# Patient Record
Sex: Female | Born: 1950 | Race: Black or African American | Hispanic: No | Marital: Married | Smoking: Former smoker
Health system: Southern US, Community
[De-identification: ages and names within clinical notes are randomized; demographics above are authoritative.]

## PROBLEM LIST (undated history)

## (undated) DIAGNOSIS — M797 Fibromyalgia: Secondary | ICD-10-CM

## (undated) DIAGNOSIS — C50919 Malignant neoplasm of unspecified site of unspecified female breast: Secondary | ICD-10-CM

## (undated) DIAGNOSIS — K5790 Diverticulosis of intestine, part unspecified, without perforation or abscess without bleeding: Secondary | ICD-10-CM

## (undated) DIAGNOSIS — R7303 Prediabetes: Secondary | ICD-10-CM

## (undated) DIAGNOSIS — E119 Type 2 diabetes mellitus without complications: Secondary | ICD-10-CM

## (undated) DIAGNOSIS — I10 Essential (primary) hypertension: Secondary | ICD-10-CM

## (undated) DIAGNOSIS — K219 Gastro-esophageal reflux disease without esophagitis: Secondary | ICD-10-CM

## (undated) DIAGNOSIS — E7800 Pure hypercholesterolemia, unspecified: Secondary | ICD-10-CM

## (undated) DIAGNOSIS — F419 Anxiety disorder, unspecified: Secondary | ICD-10-CM

## (undated) DIAGNOSIS — M2550 Pain in unspecified joint: Secondary | ICD-10-CM

## (undated) DIAGNOSIS — R011 Cardiac murmur, unspecified: Secondary | ICD-10-CM

## (undated) DIAGNOSIS — F329 Major depressive disorder, single episode, unspecified: Secondary | ICD-10-CM

## (undated) DIAGNOSIS — F32A Depression, unspecified: Secondary | ICD-10-CM

## (undated) DIAGNOSIS — T7840XA Allergy, unspecified, initial encounter: Secondary | ICD-10-CM

## (undated) HISTORY — DX: Prediabetes: R73.03

## (undated) HISTORY — DX: Fibromyalgia: M79.7

## (undated) HISTORY — DX: Cardiac murmur, unspecified: R01.1

## (undated) HISTORY — DX: Gastro-esophageal reflux disease without esophagitis: K21.9

## (undated) HISTORY — DX: Type 2 diabetes mellitus without complications: E11.9

## (undated) HISTORY — PX: TUBAL LIGATION: SHX77

## (undated) HISTORY — PX: FOOT SURGERY: SHX648

## (undated) HISTORY — DX: Malignant neoplasm of unspecified site of unspecified female breast: C50.919

## (undated) HISTORY — DX: Depression, unspecified: F32.A

## (undated) HISTORY — DX: Essential (primary) hypertension: I10

## (undated) HISTORY — DX: Allergy, unspecified, initial encounter: T78.40XA

## (undated) HISTORY — DX: Diverticulosis of intestine, part unspecified, without perforation or abscess without bleeding: K57.90

## (undated) HISTORY — DX: Pure hypercholesterolemia, unspecified: E78.00

## (undated) HISTORY — DX: Pain in unspecified joint: M25.50

---
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
## 2000-05-08 ENCOUNTER — Encounter: Payer: Self-pay | Admitting: Emergency Medicine

## 2000-05-08 ENCOUNTER — Emergency Department (HOSPITAL_COMMUNITY): Admission: EM | Admit: 2000-05-08 | Discharge: 2000-05-08 | Payer: Self-pay | Admitting: Emergency Medicine

## 2001-02-14 ENCOUNTER — Emergency Department (HOSPITAL_COMMUNITY): Admission: EM | Admit: 2001-02-14 | Discharge: 2001-02-15 | Payer: Self-pay | Admitting: Emergency Medicine

## 2001-04-27 ENCOUNTER — Ambulatory Visit (HOSPITAL_COMMUNITY): Admission: RE | Admit: 2001-04-27 | Discharge: 2001-04-27 | Payer: Self-pay | Admitting: Obstetrics and Gynecology

## 2001-04-27 ENCOUNTER — Encounter: Payer: Self-pay | Admitting: Obstetrics and Gynecology

## 2003-10-13 ENCOUNTER — Emergency Department (HOSPITAL_COMMUNITY): Admission: EM | Admit: 2003-10-13 | Discharge: 2003-10-13 | Payer: Self-pay | Admitting: Family Medicine

## 2003-12-09 ENCOUNTER — Encounter: Admission: RE | Admit: 2003-12-09 | Discharge: 2003-12-09 | Payer: Self-pay | Admitting: Family Medicine

## 2004-02-23 ENCOUNTER — Emergency Department (HOSPITAL_COMMUNITY): Admission: EM | Admit: 2004-02-23 | Discharge: 2004-02-23 | Payer: Self-pay | Admitting: Emergency Medicine

## 2004-09-25 ENCOUNTER — Other Ambulatory Visit: Admission: RE | Admit: 2004-09-25 | Discharge: 2004-09-25 | Payer: Self-pay | Admitting: Radiology

## 2005-07-01 LAB — HM COLONOSCOPY: HM Colonoscopy: NORMAL

## 2005-12-16 ENCOUNTER — Ambulatory Visit (HOSPITAL_COMMUNITY): Payer: Self-pay | Admitting: *Deleted

## 2006-06-28 ENCOUNTER — Emergency Department (HOSPITAL_COMMUNITY): Admission: EM | Admit: 2006-06-28 | Discharge: 2006-06-28 | Payer: Self-pay | Admitting: Emergency Medicine

## 2006-08-18 ENCOUNTER — Encounter: Admission: RE | Admit: 2006-08-18 | Discharge: 2006-11-16 | Payer: Self-pay | Admitting: Family Medicine

## 2006-12-17 ENCOUNTER — Ambulatory Visit: Payer: Self-pay | Admitting: Gastroenterology

## 2006-12-29 ENCOUNTER — Ambulatory Visit: Payer: Self-pay | Admitting: Gastroenterology

## 2007-01-29 ENCOUNTER — Ambulatory Visit: Payer: Self-pay | Admitting: Gastroenterology

## 2007-03-26 ENCOUNTER — Emergency Department: Payer: Self-pay | Admitting: Emergency Medicine

## 2008-01-28 ENCOUNTER — Encounter: Payer: Self-pay | Admitting: Internal Medicine

## 2008-05-06 ENCOUNTER — Other Ambulatory Visit: Admission: RE | Admit: 2008-05-06 | Discharge: 2008-05-06 | Payer: Self-pay | Admitting: Obstetrics and Gynecology

## 2008-11-21 ENCOUNTER — Emergency Department: Payer: Self-pay | Admitting: Emergency Medicine

## 2009-02-06 ENCOUNTER — Encounter (INDEPENDENT_AMBULATORY_CARE_PROVIDER_SITE_OTHER): Payer: Self-pay | Admitting: *Deleted

## 2009-02-06 LAB — CONVERTED CEMR LAB
Cholesterol: 188 mg/dL
HDL: 58 mg/dL
Total CHOL/HDL Ratio: 3.24
Triglycerides: 59 mg/dL

## 2009-02-13 ENCOUNTER — Emergency Department: Payer: Self-pay | Admitting: Emergency Medicine

## 2009-06-09 ENCOUNTER — Encounter: Payer: Self-pay | Admitting: Family Medicine

## 2009-07-18 ENCOUNTER — Telehealth (INDEPENDENT_AMBULATORY_CARE_PROVIDER_SITE_OTHER): Payer: Self-pay | Admitting: *Deleted

## 2009-08-02 ENCOUNTER — Ambulatory Visit: Payer: Self-pay | Admitting: Family Medicine

## 2010-02-08 ENCOUNTER — Ambulatory Visit: Payer: Self-pay | Admitting: Obstetrics and Gynecology

## 2010-04-03 ENCOUNTER — Ambulatory Visit: Payer: Self-pay | Admitting: Family Medicine

## 2010-04-03 DIAGNOSIS — N3011 Interstitial cystitis (chronic) with hematuria: Secondary | ICD-10-CM

## 2010-04-03 DIAGNOSIS — N959 Unspecified menopausal and perimenopausal disorder: Secondary | ICD-10-CM | POA: Insufficient documentation

## 2010-04-03 DIAGNOSIS — IMO0001 Reserved for inherently not codable concepts without codable children: Secondary | ICD-10-CM

## 2010-04-03 DIAGNOSIS — J309 Allergic rhinitis, unspecified: Secondary | ICD-10-CM | POA: Insufficient documentation

## 2010-04-03 DIAGNOSIS — I10 Essential (primary) hypertension: Secondary | ICD-10-CM | POA: Insufficient documentation

## 2010-04-03 DIAGNOSIS — F321 Major depressive disorder, single episode, moderate: Secondary | ICD-10-CM

## 2010-04-03 DIAGNOSIS — D509 Iron deficiency anemia, unspecified: Secondary | ICD-10-CM | POA: Insufficient documentation

## 2010-04-03 DIAGNOSIS — M479 Spondylosis, unspecified: Secondary | ICD-10-CM | POA: Insufficient documentation

## 2010-04-05 ENCOUNTER — Ambulatory Visit: Payer: Self-pay | Admitting: Family Medicine

## 2010-04-05 DIAGNOSIS — E7800 Pure hypercholesterolemia, unspecified: Secondary | ICD-10-CM | POA: Insufficient documentation

## 2010-04-06 ENCOUNTER — Encounter (INDEPENDENT_AMBULATORY_CARE_PROVIDER_SITE_OTHER): Payer: Self-pay | Admitting: *Deleted

## 2010-04-06 ENCOUNTER — Telehealth: Payer: Self-pay | Admitting: Family Medicine

## 2010-04-06 LAB — CONVERTED CEMR LAB
ALT: 18 units/L (ref 0–35)
AST: 22 units/L (ref 0–37)
Bilirubin, Direct: 0 mg/dL (ref 0.0–0.3)
CO2: 33 meq/L — ABNORMAL HIGH (ref 19–32)
Cholesterol: 194 mg/dL (ref 0–200)
HDL: 61.7 mg/dL (ref >39.00)
LDL Cholesterol: 120 mg/dL — ABNORMAL HIGH (ref 0–99)
Potassium: 3.8 meq/L (ref 3.5–5.1)
VLDL: 12.2 mg/dL (ref 0.0–40.0)

## 2010-04-19 ENCOUNTER — Ambulatory Visit: Payer: Self-pay | Admitting: Family Medicine

## 2010-04-27 ENCOUNTER — Encounter: Payer: Self-pay | Admitting: Family Medicine

## 2010-05-15 ENCOUNTER — Ambulatory Visit: Payer: Self-pay | Admitting: Family Medicine

## 2010-05-19 ENCOUNTER — Ambulatory Visit: Payer: Self-pay | Admitting: Cardiovascular Disease

## 2010-05-19 ENCOUNTER — Emergency Department: Payer: Self-pay | Admitting: Emergency Medicine

## 2010-06-05 ENCOUNTER — Ambulatory Visit: Payer: Self-pay | Admitting: Family Medicine

## 2010-06-21 ENCOUNTER — Telehealth (INDEPENDENT_AMBULATORY_CARE_PROVIDER_SITE_OTHER): Payer: Self-pay | Admitting: *Deleted

## 2010-06-21 ENCOUNTER — Ambulatory Visit: Payer: Self-pay | Admitting: Family Medicine

## 2010-06-26 ENCOUNTER — Ambulatory Visit: Payer: Self-pay | Admitting: Family Medicine

## 2010-07-10 ENCOUNTER — Ambulatory Visit: Admit: 2010-07-10 | Payer: Self-pay | Admitting: Family Medicine

## 2010-07-22 ENCOUNTER — Encounter: Payer: Self-pay | Admitting: Rheumatology

## 2010-07-29 LAB — CONVERTED CEMR LAB
Pap Smear: NORMAL
Pap Smear: NORMAL
Pap Smear: NORMAL

## 2010-07-31 NOTE — Assessment & Plan Note (Signed)
Summary: COUGH,CONGESTION/CLE   Vital Signs:  Patient profile:   60 year old female Height:      64 inches Weight:      136.4 pounds BMI:     23.50 Temp:     98.3 degrees F oral Pulse rate:   76 / minute Pulse rhythm:   regular BP sitting:   100 / 70  (left arm) Cuff size:   regular  Vitals Entered By: Benny Lennert CMA Duncan Dull) (June 05, 2010 11:18 AM)  History of Present Illness: Chief complaint cough and congestion  Sneezing alot.. worse sinus congestion this fall.    Acute Visit History:      The patient complains of cough, headache, nasal discharge, and sore throat.  These symptoms began 3 days ago.  She denies chest pain, earache, fever, and sinus problems.  Other comments include: 3 weeks ago ..had cough..resolved on its own  mild body ache.   Using OTC guafenesin.        The patient notes shortness of breath.  The cough interferes with her sleep.  The character of the cough is described as productive.  There is no history of wheezing associated with her cough.        Preventive Screening-Counseling & Management  Alcohol-Tobacco     Smoking Status: quit  Problems Prior to Update: 1)  Hypercholesterolemia  (ICD-272.0) 2)  Family History Breast Cancer 1st Degree Relative <50  (ICD-V16.3) 3)  Special Screening For Osteoporosis  (ICD-V82.81) 4)  Hypertension  (ICD-401.9) 5)  Allergic Rhinitis  (ICD-477.9) 6)  Interstitial Cystitis  (ICD-595.1) 7)  Postmenopausal Syndrome  (ICD-627.9) 8)  Depression, Major, Moderate  (ICD-296.22) 9)  Degenerative Joint Disease, Cervical Spine  (ICD-721.90) 10)  Fibromyalgia  (ICD-729.1) 11)  Anemia, Iron Deficiency  (ICD-280.9)  Current Medications (verified): 1)  Zetia 10 Mg Tabs (Ezetimibe) .... One Tablet 2 Times Daily 2)  Lyrica 100 Mg Caps (Pregabalin) .... Take One Tablet 2 Times Daily 3)  Alprazolam 1 Mg Tabs (Alprazolam) .... Take One Tablet 4 Times Daily 4)  Skelaxin 800 Mg Tabs (Metaxalone) .... Take One Tablet  3  Times Daily 5)  Boniva 150 Mg Tabs (Ibandronate Sodium) .... Not Sure of Direction 6)  Elmiron 100 Mg Caps (Pentosan Polysulfate Sodium) .... Take One Tablet 3 Times A Day 7)  Hyzaar 50-12.5 Mg Tabs (Losartan Potassium-Hctz) .... One Tablet Daily 8)  Hyomax-Ft 0.125 Mg Tbdp (Hyoscyamine Sulfate) .Marland Kitchen.. 1 Tab By Mouth Qid As Needed Gi Spasm  Allergies (verified): No Known Drug Allergies  Past History:  Past medical, surgical, family and social histories (including risk factors) reviewed, and no changes noted (except as noted below).  Past Medical History: Reviewed history from 04/03/2010 and no changes required. Allergic rhinitis Hypertension  Family History: Reviewed history from 04/03/2010 and no changes required. mother: Alzheimer's father: CAD, prostate cacner, HTN, DM sister: breast cancer Family History Breast cancer 1st degree relative <50 brother: prostate Cacner, HTN, high chol  Social History: Reviewed history from 04/03/2010 and no changes required. 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 CV:  Denies chest pain or discomfort. Resp:  Denies coughing up blood.  Physical Exam  General:  Well-developed,well-nourished,in no acute distress; alert,appropriate and cooperative throughout examination Head:  no maxillart sinus pain Eyes:  No corneal or conjunctival inflammation  noted. EOMI. Perrla. Funduscopic exam benign, without hemorrhages, exudates or papilledema. Vision grossly normal. Ears:  External ear exam shows no significant lesions or deformities.  Otoscopic examination reveals clear canals, tympanic membranes are intact bilaterally without bulging, retraction, inflammation or discharge. Hearing is grossly normal  bilaterally. Nose:  nasla congestion, mucosal pallor Mouth:  MMM, no oropharyngeal erythema Neck:  no carotid bruit or thyromegaly no cervical or supraclavicular lymphadenopathy  Lungs:  Normal respiratory effort, chest expands symmetrically. Lungs are clear to auscultation, no crackles or wheezes. Heart:  Normal rate and regular rhythm. S1 and S2 normal without gallop, murmur, click, rub or other extra sounds.   Impression & Recommendations:  Problem # 1:  URI (ICD-465.9)  Her updated medication list for this problem includes:    Benzonatate 200 Mg Caps (Benzonatate) .Marland Kitchen... 1 cap by mouth three times a day as needed cough    Zyrtec Allergy 10 Mg Tabs (Cetirizine hcl) .Marland Kitchen... 1 tab by mouth at bedtime  Instructed on symptomatic treatment. Call if symptoms persist  past 4-5 days or worsen.   Problem # 2:  ALLERGIC RHINITIS (ICD-477.9) Add nasal steroid spray and zyrtec given symptoms ongoing all season.  Her updated medication list for this problem includes:    Fluticasone Propionate 50 Mcg/act Susp (Fluticasone propionate) .Marland Kitchen... 2 sprays per nostril at bedtime    Zyrtec Allergy 10 Mg Tabs (Cetirizine hcl) .Marland Kitchen... 1 tab by mouth at bedtime  Complete Medication List: 1)  Zetia 10 Mg Tabs (Ezetimibe) .... One tablet 2 times daily 2)  Lyrica 100 Mg Caps (Pregabalin) .... Take one tablet 2 times daily 3)  Alprazolam 1 Mg Tabs (Alprazolam) .... Take one tablet 4 times daily 4)  Skelaxin 800 Mg Tabs (Metaxalone) .... Take one tablet  3 times daily 5)  Boniva 150 Mg Tabs (Ibandronate sodium) .... Not sure of direction 6)  Elmiron 100 Mg Caps (Pentosan polysulfate sodium) .... Take one tablet 3 times a day 7)  Hyzaar 50-12.5 Mg Tabs (Losartan potassium-hctz) .... One tablet daily 8)  Hyomax-ft 0.125 Mg Tbdp (Hyoscyamine sulfate) .Marland Kitchen.. 1 tab by mouth qid as needed gi spasm 9)  Fluticasone Propionate 50 Mcg/act Susp (Fluticasone propionate) .... 2 sprays per nostril at bedtime 10)  Benzonatate 200  Mg Caps (Benzonatate) .Marland Kitchen.. 1 cap by mouth three times a day as needed cough 11)  Zyrtec Allergy 10 Mg Tabs (Cetirizine hcl) .Marland Kitchen.. 1 tab by mouth at bedtime  Patient Instructions: 1)  Start mucinex no decongestant daily. 2)  Start nasal steroid spray. 3)  Zyrtec at bedtime. 4)   Cough suppressant as needed. Call if symptoms not improving in 4-5 days.  Prescriptions: BENZONATATE 200 MG CAPS (BENZONATATE) 1 cap by mouth three times a day as needed cough  #30 x 0   Entered and Authorized by:   Kerby Nora MD   Signed by:   Kerby Nora MD on 06/05/2010   Method used:   Electronically to        [id number] (retail)       [address]       [place], [place]  [address]       Ph: [phone]       Fax: [phone]   RxID:   [id number] FLUTICASONE PROPIONATE 50 MCG/ACT SUSP (FLUTICASONE PROPIONATE) 2 sprays per nostril at bedtime  #1 x 3   Entered and Authorized by:   Kerby Nora MD   Signed by:   Kerby Nora MD on 06/05/2010  Method used:   [facility]ically to        [id number]       [address]       [place], [place]  [address]       Ph: [phone]       Fax: [phone]   RxID:   [id number]    Orders Added: 1)  Est. Patient Level III [24401]    Current Allergies (reviewed today): No known allergies

## 2010-07-31 NOTE — Progress Notes (Signed)
Summary: wants something for abd spasms  Phone Note Call from Patient Call back at Work Phone [phone]   Caller: Patient Call For: Kerby Nora MD Summary of Call: Pt is asking that levsin be called to [facility].  She says she has occasional problems with abd spasms that she forgot to mention to you at her office visit.  Her previous doctor gave her hyomax .125 mg's. Initial call taken by: Lowella Petties CMA,  April 06, 2010 10:02 AM  Follow-up for Phone Call        Patient advised.Consuello Masse CMA   Follow-up by: Benny Lennert CMA Duncan Dull),  April 06, 2010 11:10 AM    New/Updated Medications: HYOMAX-FT 0.125 MG TBDP (HYOSCYAMINE SULFATE) 1 tab by mouth QID as needed Gi spasm Prescriptions: HYOMAX-FT 0.125 MG TBDP (HYOSCYAMINE SULFATE) 1 tab by mouth QID as needed Gi spasm  #90 x 2   Entered and Authorized by:   Kerby Nora MD   Signed by:   Kerby Nora MD on 04/06/2010   Method used:   Electronically to        [id number]       [address]       [place], [place]  [address]       Ph: [phone]       Fax: [phone]   RxID:   [id number]

## 2010-07-31 NOTE — Miscellaneous (Signed)
   Clinical Lists Changes  Observations: Added new observation of FERRITIN: 245 ng/mL (02/06/2009 12:00) Added new observation of IRON SATUR %: 20 % (02/06/2009 12:00) Added new observation of IRON: 68 mcg/dL (29/92/4268 34:19) Added new observation of CHOL/HDL: 3.24  (02/06/2009 12:00) Added new observation of LDL: 125 mg/dL (62/22/9798 92:11) Added new observation of HDL: 58 mg/dL (94/17/4081 44:81) Added new observation of TRIGLYC TOT: 59 mg/dL (85/63/1497 02:63) Added new observation of CHOLESTEROL: 188 mg/dL (78/58/8502 77:41)

## 2010-07-31 NOTE — Progress Notes (Signed)
Summary: ? New patient  Phone Note From Other Clinic Call back at [phone] ext. [phone]   Caller: Vicky Wilborn/[facility] Summary of Call: Vicky called to see if we can schedule patient an appt to establish care with Dr. Patsy Lager.  She says Shannon Hancock has hypertension and needs to be followed by a physician, she was recommended to Dr. Patsy Lager by his sister in law.  Please call Vicky if this is ok and if/when appt is scheduled.   Initial call taken by: [name]),  July 18, 2009 2:42 PM  Follow-up for Phone Call        Looks like I could see someone next week. Please schedule. Follow-up by: Hannah Beat MD,  July 18, 2009 2:48 PM  Additional Follow-up for Phone Call Additional follow up Details #1::        Dr.Copland, Pt.had an appt. w/ Dr.Bedsole on 07/11/09,but it was cancelled due to the weather.  I r/s her appt. w/ Dr.Bedsole to 08/02/09 @ 10:00. Additional Follow-up by: [name],  July 20, 2009 8:31 AM    Additional Follow-up for Phone Call Additional follow up Details #2::    [name] KitchenMarland KitchenPlease call patient..I am happy to see her, but if she wants to see Copland in preference then that is fine as well. Up to her. Copland is willing to see her.   Additional Follow-up for Phone Call Additional follow up Details #3:: Details for Additional Follow-up Action Taken: When I called the pt. earlier I told her I could make her appt. w/ Dr.Copland,but she said she wanted to see you.  The Drs. Office is the one that asked for her to see Dr.Copland. [name],  July 20, 2009 10:27 AM   Sound great..thanks. Kerby Nora MD  July 20, 2009 10:37 AM

## 2010-07-31 NOTE — Assessment & Plan Note (Signed)
Summary: NEW PT TO EST/CLE   Vital Signs:  Patient profile:   60 year old female Height:      64 inches Weight:      137.5 pounds BMI:     23.69 Temp:     98.0 degrees F oral Pulse rate:   76 / minute Pulse rhythm:   regular BP sitting:   110 / 70  (left arm) Cuff size:   regular  Vitals Entered By: Benny Lennert CMA Duncan Dull) (April 03, 2010 10:09 AM)  History of Present Illness: Chief complaint new patient to be established   Has been seeing Dr Cliffton Asters.. for last 15 years.Marland Kitchenleaving as in the area. HAs GYN...Dr. Catalina Gravel...had CPX.  NMl mammogram... Sister with breast cancer...  In past year she has noted B axillae sore. No masses.  Fibromyalgia, moderate control..sees Dr. Corliss Skains. on skelaxin and lyrica  Was Yoga  Depression, moderate control Has not tolertated meds in the past. Using Xanax for anxiety.. 3 a day...including using at night for sleep as well. Using instead of ambien in past.  Tried amitryptiline, seroquel, cymbalta in past.  Interstitial cystitis.Marland Kitchenon elmiron intermittantly...given by Dr. Barnabas Lister. Had cystoscopy.. pt  thinks nml.  Occurs when under stress.   HTN, well controlled on current dose hyzaar/HCTZ High cholesterol..last check in last year ..not adequately controlle don zetia  Preventive Screening-Counseling & Management  Alcohol-Tobacco     Smoking Status: current  Caffeine-Diet-Exercise     Does Patient Exercise: no      Drug Use:  no.    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 One Tablet 2 Times Daily 2)  Lyrica 100 Mg Caps (Pregabalin) .... Take One Tablet 2 Times Daily 3)  Alprazolam 1 Mg Tabs (Alprazolam) .... Take One Tablet 4 Times Daily 4)  Skelaxin 800 Mg Tabs (Metaxalone) .... Take One Tablet  3 Times Daily 5)  Boniva 150 Mg Tabs (Ibandronate Sodium) .... Not Sure of Direction 6)  Elmiron 100 Mg Caps (Pentosan Polysulfate Sodium) .... Take One Tablet 3 Times A Day 7)  Hyzaar 50-12.5 Mg Tabs (Losartan Potassium-Hctz) .... One Tablet Daily  Allergies (verified): No Known Drug Allergies  Past History:  Past medical, surgical, family and social histories (including risk factors) reviewed, and no changes noted (except as noted below).  Past Medical History: Allergic rhinitis Hypertension  Family History: Reviewed history and no changes required. mother: Alzheimer's father: CAD, prostate cacner, HTN, DM sister: breast cancer Family History Breast cancer 1st degree relative <50 brother: prostate Cacner, HTN, high chol  Social History: Reviewed history and no changes required. 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 CV:  Denies chest pain or discomfort. Resp:  Denies shortness of breath. GI:  Denies abdominal pain. GU:  Denies dysuria.  Physical Exam  General:  Well-developed,well-nourished,in no acute distress; alert,appropriate and cooperative throughout examination Mouth:  Oral mucosa and oropharynx without lesions or exudates.  Teeth in good repair. Neck:  no carotid bruit or thyromegaly no cervical or supraclavicular lymphadenopathy  Lungs:  Normal  respiratory effort, chest expands symmetrically. Lungs are clear to auscultation, no crackles or wheezes. Heart:  Normal rate and regular rhythm. S1 and S2 normal without gallop, murmur, click, rub or other extra sounds. Abdomen:  Bowel sounds positive,abdomen soft and non-tender without masses, organomegaly or hernias noted. Pulses:  R and L posterior tibial pulses are full and equal bilaterally  Extremities:  no edema  Skin:  Intact without suspicious lesions or rashes Psych:  Cognition and judgment appear intact. Alert and cooperative with normal attention span and concentration. No apparent delusions, illusions, hallucinations   Impression & Recommendations:  Problem # 1:  DEPRESSION, MAJOR, MODERATE (ICD-296.22) Moderate control. Wean off xanax as tolerated. Has not tolerated other medicaiotns to treat this in past.   Problem # 2:  FIBROMYALGIA (ICD-729.1) Fibromyalgia, moderate control..sees Dr. Corliss Skains. On skelaxin and lyrica  Was doing Yoga, plans to restart.  Requests referral to a different rheumatologist, but I recommended againt given Dr. Corliss Skains is very good with fibromyalgia. Did better in past when was able to go to a deep tissue massuse.  Offered referral to [facility].Isaias Sakai refused as when sh went in past..no help. Her updated medication list for this problem includes:    Skelaxin 800 Mg Tabs (Metaxalone) .Marland Kitchen... Take one tablet  3 times daily  Problem # 3:  HYPERTENSION (ICD-401.9) Well controlled. Continue current medication.  Her updated medication list for this problem includes:    Hyzaar 50-12.5 Mg Tabs (Losartan potassium-hctz) ..... One tablet daily  BP today: 110/70  Problem # 4:  INTERSTITIAL CYSTITIS (ICD-595.1) I do not prescribe elmiron...will refer to uro for full eval and recomendations on treatment.  Orders: Urology Referral (Urology)  Problem # 5:  HYPERCHOLESTEROLEMIA (ICD-272.0) Due for reeval.  Her updated medication  list for this problem includes:    Zetia 10 Mg Tabs (Ezetimibe) ..... One tablet 2 times daily  Complete Medication List: 1)  Zetia 10 Mg Tabs (Ezetimibe) .... One tablet 2 times daily 2)  Lyrica 100 Mg Caps (Pregabalin) .... Take one tablet 2 times daily 3)  Alprazolam 1 Mg Tabs (Alprazolam) .... Take one tablet 4 times daily 4)  Skelaxin 800 Mg Tabs (Metaxalone) .... Take one tablet  3 times daily 5)  Boniva 150 Mg Tabs (Ibandronate sodium) .... Not sure of direction 6)  Elmiron 100 Mg Caps (Pentosan polysulfate sodium) .... Take one tablet 3 times a day 7)  Hyzaar 50-12.5 Mg Tabs (Losartan potassium-hctz) .... One tablet daily 8)  Hyomax-ft 0.125 Mg Tbdp (Hyoscyamine sulfate) .Marland Kitchen.. 1 tab by mouth qid as needed gi spasm  Other Orders: Flu Vaccine 20yrs + ([address]) Admin 1st Vaccine (60454) Radiology Referral (Radiology)  Patient Instructions: 1)  Try to reduce the amount of xanax used. 2)  Get back on track with exercise. 3)  Referral Appointment Information 4)  Day/Date: 5)  Time: 6)  Place/MD: 7)  Address: 8)  Phone/Fax: 9)  Patient given appointment information. Information/Orders faxed/mailed.  10)  Fasting lipids, CMET Dx 272.0.  11)  Please schedule a follow-up appointment in 1 month 30 min.   Current Allergies (reviewed today): No known allergies    Flu Vaccine Result Date:  04/04/2010 Flu Vaccine Result:  given Flu Vaccine Next Due:  1  yr TD Result Date:  07/01/2001 TD Result:  given TD Next Due:  10 yr Flex Sig Next Due:  Not Indicated Colonoscopy Result Date:  07/01/2005 Colonoscopy Result:  normal Colonoscopy Next Due:  10 yr PAP Result Date:  12/29/2009 PAP Result:  normal PAP Next Due:  1 yr Mammogram Result Date:  12/29/2009 Mammogram Result:  normal Mammogram Next Due:  1 yr Last DXA 3 years ago.    Past Medical History:    Reviewed history and no changes required:       Allergic rhinitis       Hypertension        Immunizations  Administered:  Influenza Vaccine # 1:    Vaccine Type: Fluvax 3+    Site: left deltoid    Mfr: GlaxoSmithKline    Dose: 0.5 ml    Route: IM    Given by: Benny Lennert CMA (AAMA)    Exp. Date: 12/29/2010    Lot #: [id number]    VIS given: 01/23/10 version given April 03, 2010.  Flu Vaccine Consent Questions:    Do you have a history of severe allergic reactions to this vaccine? no    Any prior history of allergic reactions to egg and/or gelatin? no    Do you have a sensitivity to the preservative Thimersol? no    Do you have a past history of Guillan-Barre Syndrome? no    Do you currently have an acute febrile illness? no    Have you ever had a severe reaction to latex? no    Vaccine information given and explained to patient? yes    Are you currently pregnant? no  Appended Document: Orders Update     Clinical Lists Changes  Problems: Changed problem from OTHER OSTEOPOROSIS (ICD-733.09) to SPECIAL SCREENING FOR OSTEOPOROSIS (ICD-V82.81)

## 2010-07-31 NOTE — Letter (Signed)
Summary: [facility]  [facility]   Imported By: Lanelle Bal 07/27/2009 11:18:55  _____________________________________________________________________  External Attachment:    Type:   Image     Comment:   External Document

## 2010-08-02 NOTE — Progress Notes (Signed)
----   Converted from flag ---- ---- 06/20/2010 4:30 PM, Kerby Nora MD wrote: Had labs in 03/2010.. no labs needed unless pt symptomatic.   ---- 06/20/2010 7:54 AM, Liane Comber CMA (AAMA) wrote: Lab orders please! Good Morning! This pt is scheduled for cpx labs Tuesday, which labs to draw and dx codes to use? Thanks Tasha ------------------------------

## 2010-08-02 NOTE — Assessment & Plan Note (Signed)
Summary: COUGH/ lb   Vital Signs:  Patient profile:   60 year old female Weight:      140.25 pounds Temp:     97.8 degrees F oral Pulse rate:   84 / minute Pulse rhythm:   regular BP sitting:   116 / 70  (left arm) Cuff size:   regular  Vitals Entered By: Selena Batten Dance CMA Duncan Dull) (June 21, 2010 8:07 AM) CC: Still has cough and no energy   History of Present Illness: CC: cough  3-4 wk h/o cough productive of mild amt sputum and fatigue.  Falls asleep on couch.  Initially started wth cold sxs, sick contacts around home.  now everyone better except patient.  + PNDrip.  no acid reflux sxs, no hoarseness.  taking flonase and zyrtec both at night.  Taking it makes cough go away, however if stops, cough returns.  Also taking mucinex which makes cough better, then comes back.  wonders if fatigue, dizziness coming from these medicines.  No fevers.  No abd pain, n/v, rashes.  No ST.  Congestion better on medicine.  + chills.  + achiness.    No smokers at home.  h/o fibromyalgia.  On lyrica, skelaxin, zyrtec, zetia, xanax which she takes at night.  Current Medications (verified): 1)  Zetia 10 Mg Tabs (Ezetimibe) .... One Tablet 2 Times Daily 2)  Lyrica 100 Mg Caps (Pregabalin) .... Take One Tablet 2 Times Daily 3)  Alprazolam 1 Mg Tabs (Alprazolam) .... Take One Tablet 4 Times Daily 4)  Skelaxin 800 Mg Tabs (Metaxalone) .... Take One Tablet  3 Times Daily 5)  Boniva 150 Mg Tabs (Ibandronate Sodium) .... Not Sure of Direction 6)  Elmiron 100 Mg Caps (Pentosan Polysulfate Sodium) .... Take One Tablet 3 Times A Day 7)  Hyzaar 50-12.5 Mg Tabs (Losartan Potassium-Hctz) .... One Tablet Daily 8)  Hyomax-Ft 0.125 Mg Tbdp (Hyoscyamine Sulfate) .Marland Kitchen.. 1 Tab By Mouth Qid As Needed Gi Spasm 9)  Fluticasone Propionate 50 Mcg/act Susp (Fluticasone Propionate) .... 2 Sprays Per Nostril At Bedtime 10)  Zyrtec Allergy 10 Mg Tabs (Cetirizine Hcl) .Marland Kitchen.. 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 EOMI. Perrla.  Ears:  External ear exam shows no significant lesions or deformities.  Otoscopic examination reveals clear canals, tympanic membranes are intact bilaterally without bulging, retraction, inflammation or discharge. Hearing is grossly normal bilaterally. Nose:  nasal congestion, mucosal pallor, swollen turbinates even on INS Mouth:  MMM, no oropharyngeal erythema Neck:  no carotid bruit or thyromegaly no cervical lymphadenopathy  Lungs:  Normal respiratory effort, chest expands symmetrically. Lungs are clear to auscultation, no crackles or wheezes. Heart:  Normal rate and regular rhythm. S1 and S2 normal without gallop, murmur, click, rub or other extra sounds. Abdomen:  Bowel sounds positive,abdomen soft and non-tender without masses, organomegaly or hernias noted.  no splenomegaly Pulses:  2+ rad pulses Extremities:  no pedal edema  Skin:  Intact without  suspicious lesions or rashes   Impression & Recommendations:  Problem # 1:  COUGH (ICD-786.2) could be post-viral cough, but sounds more consistent with allergic/PND syndrome.  treat by trial of changing INS and antihistamine.  no evidence of infection currently, no need for abx today.  no evidence of reflux cough.  Problem # 2:  FATIGUE (ICD-780.79) upcoming blood  work per patient 12/27.  will add on CBC and TSH to eval other causes of fatigue.  could be zyrtec?  change.  could be fibro.  Problem # 3:  ALLERGIC RHINITIS (ICD-477.9) Discussed use of allergy medications.  trial of other antihistamine and INS.  Her updated medication list for this problem includes:    Nasonex 50 Mcg/act Susp (Mometasone furoate) .Marland Kitchen... 2 squirts in each nostril daily    Allegra Allergy 180 Mg Tabs (Fexofenadine hcl) ..... One daily for allergies  Complete Medication List: 1)  Zetia 10 Mg Tabs (Ezetimibe) .... One tablet 2 times daily 2)  Lyrica 100 Mg Caps (Pregabalin) .... Take one tablet 2 times daily 3)  Alprazolam 1 Mg Tabs (Alprazolam) .... Take one tablet 4 times daily 4)  Skelaxin 800 Mg Tabs (Metaxalone) .... Take one tablet  3 times daily 5)  Boniva 150 Mg Tabs (Ibandronate sodium) .... Not sure of direction 6)  Elmiron 100 Mg Caps (Pentosan polysulfate sodium) .... Take one tablet 3 times a day 7)  Hyzaar 50-12.5 Mg Tabs (Losartan potassium-hctz) .... One tablet daily 8)  Hyomax-ft 0.125 Mg Tbdp (Hyoscyamine sulfate) .Marland Kitchen.. 1 tab by mouth qid as needed gi spasm 9)  Nasonex 50 Mcg/act Susp (Mometasone furoate) .... 2 squirts in each nostril daily 10)  Allegra Allergy 180 Mg Tabs (Fexofenadine hcl) .... One daily for allergies  Patient Instructions: 1)  sounds like allergic cough or post viral cough. 2)  try another antihistamine instead of zyrtec - start claritin or allegra in am. 3)  try another nasal steroid - nasonex sent to pharmacy. 4)  Let us know if not helping. 5)  Add CBC, TSH to blood draw when you come 06/26/2010. Prescriptions: NASONEX 50 MCG/ACT SUSP (MOMETASONE FUROATE) 2 squirts in each nostril daily  #1 x 3   Entered and Authorized by:   Eustaquio Boyden  MD   Signed by:   Eustaquio Boyden  MD on 06/21/2010   Method used:   Electronically to        [id number] (retail)       [address]       [place], [place]   [address]       Ph: [phone]       Fax: [phone]   RxID:   [id number]    Orders Added: 1)  Est. Patient Level III [24401]    Current Allergies (reviewed today): No known allergies

## 2010-11-02 ENCOUNTER — Other Ambulatory Visit: Payer: Self-pay | Admitting: Family Medicine

## 2011-01-01 ENCOUNTER — Other Ambulatory Visit: Payer: Self-pay | Admitting: Family Medicine

## 2011-03-30 ENCOUNTER — Emergency Department: Payer: Self-pay | Admitting: Internal Medicine

## 2011-04-01 ENCOUNTER — Telehealth: Payer: Self-pay | Admitting: *Deleted

## 2011-04-01 NOTE — Telephone Encounter (Signed)
Patient went to [facility] ER Saturday and was dx with UTI, cystitis and gastritis and was given antibiotic and pain medication. Patient is scheduled to see you Friday. Patient states that she has a headache, achy and cold all over. The pain in her stomach has stopped and it does not hurt for her to urinate like it did. Patient states that she has a ringing in her head. Patient would like to see you before Friday and wants to know if she can be worked.

## 2011-04-01 NOTE — Telephone Encounter (Signed)
Appt made for patient on Thursday

## 2011-04-01 NOTE — Telephone Encounter (Signed)
Sounds to me like her symptoms are improving with treatment of urinary infection. I would continue antibiotics.. We can add her on to Thursday scheule, but I do not see reason she needs to be seen today. Use tylenol for headache.

## 2011-04-03 ENCOUNTER — Encounter: Payer: Self-pay | Admitting: Family Medicine

## 2011-04-04 ENCOUNTER — Ambulatory Visit (INDEPENDENT_AMBULATORY_CARE_PROVIDER_SITE_OTHER): Payer: Medicare Other | Admitting: Family Medicine

## 2011-04-04 ENCOUNTER — Encounter: Payer: Self-pay | Admitting: Family Medicine

## 2011-04-04 DIAGNOSIS — N390 Urinary tract infection, site not specified: Secondary | ICD-10-CM

## 2011-04-04 DIAGNOSIS — K2970 Gastritis, unspecified, without bleeding: Secondary | ICD-10-CM

## 2011-04-04 DIAGNOSIS — R55 Syncope and collapse: Secondary | ICD-10-CM

## 2011-04-04 DIAGNOSIS — M542 Cervicalgia: Secondary | ICD-10-CM

## 2011-04-04 DIAGNOSIS — H9319 Tinnitus, unspecified ear: Secondary | ICD-10-CM

## 2011-04-04 DIAGNOSIS — N3010 Interstitial cystitis (chronic) without hematuria: Secondary | ICD-10-CM

## 2011-04-04 DIAGNOSIS — N3011 Interstitial cystitis (chronic) with hematuria: Secondary | ICD-10-CM

## 2011-04-04 NOTE — Patient Instructions (Addendum)
We will call you with recommendations to Pt for opthamologist in the [place] area also with referral to integrative therapies. If eye exam does not explain all eye symptoms.. We could consider decreasing lyrica to see if still go fibro control but less blurred vision. Complete Cipro.. Likely causing headache and ringing in ears. Passing out spell likely due to too much pain med on same day.  Continue prilosec , make changes in diet to avoid heartburn triggers as discussed. Stop vicodin.. Use heat, gentle stretching, massage for neck muscle strain.

## 2011-04-04 NOTE — Progress Notes (Signed)
Subjective:    Patient ID: Shannon Hancock, female    DOB: [birth year], 60 y.o.   MRN: [id number]  HPI  60 year old female with history of interstitial cystitis presents following [facility] ER visit on 9/29 for low back pain, right lower abdominal pain, aches and chills. Had also noted occ burning to urinate few days prior. Had noted some occ heartburn, central chest pain.   She had elevated wbc at 11.3, Hg 11.1, nml CMET, UA showed blood and LE.  EKG NSR, Nonspecific ST changes. Abdomen/Pelvic CT: nml appendix, mild gastric antral wall thickening due to ? Peristalsis vs gastritis. Given Zosyn  IV, Morphine IV for pain. Given vicodin before she left Dx with UTI and gastritis.   Started on cipro x 10 days. Now on 6/10. Prilosec 40 mg daily  and vicodin prn pain.  The night after ER visit.. After taking pain med dose again... She felt woozy, ringing in ears... Passed out, LOC. Jerked neck, Did not hit head.  BP was low diastolic 50s. Went back to bed... Next day no further wooziness, but has been sleepy an  Today, she reports resolution of urinary symptoms, no further abdominal pain. Drinking fluids. Since starting  antibiotics.. Has had ringing in  B ears.  No fever. Has had some neck pain B. Lidoderm patches have helped. Also took vicodin for this as well. HAs not needed today. Headache as well.   Secondary issue... Has had vison blurred ... 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 Has referral to opthmologist.   She is concerned this may be from lyrica for fibromyalgia...on max dose. Has been on lyrica for many years. Usually she is well controlled pain... Except for flares after eshe pushes herself.  Also of note she chronically has blood in urine and IC .Marland Kitchen Sees urologist at [facility] has had eval. No need to recheck UA. Has follow up with him in 1 week.  Review of Systems  All other systems reviewed and are negative.       Objective:   Physical  Exam  Constitutional: She is oriented to person, place, and time. Vital signs are normal. She appears well-developed and well-nourished. She is cooperative.  Non-toxic appearance. She does not appear ill. No distress.  HENT:  Head: Normocephalic.  Right Ear: Hearing, tympanic membrane, external ear and ear canal normal. Tympanic membrane is not erythematous, not retracted and not bulging.  Left Ear: Hearing, tympanic membrane, external ear and ear canal normal. Tympanic membrane is not erythematous, not retracted and not bulging.  Nose: No mucosal edema or rhinorrhea. Right sinus exhibits no maxillary sinus tenderness and no frontal sinus tenderness. Left sinus exhibits no maxillary sinus tenderness and no frontal sinus tenderness.  Mouth/Throat: Uvula is midline, oropharynx is clear and moist and mucous membranes are normal.  Eyes: Conjunctivae, EOM and lids are normal. Pupils are equal, round, and reactive to light. No foreign bodies found.  Neck: Trachea normal and normal range of motion. Neck supple. Carotid bruit is not present. No mass and no thyromegaly present.  Cardiovascular: Normal rate, regular rhythm, S1 normal, S2 normal, normal heart sounds, intact distal pulses and normal pulses.  Exam reveals no gallop and no friction rub.   No murmur heard. Pulmonary/Chest: Effort normal and breath sounds normal. Not tachypneic. No respiratory distress. She has no decreased breath sounds. She has no wheezes. She has no rhonchi. She has no rales.  Abdominal: Soft. Normal appearance and bowel sounds  are normal. There is no tenderness.  Musculoskeletal:       Cervical back: She exhibits tenderness. She exhibits normal range of motion and no bony tenderness.       ttp over trapezius B.  Neurological: She is alert and oriented to person, place, and time. She has normal reflexes. She displays normal reflexes. No cranial nerve deficit. Coordination normal.  Skin: Skin is warm, dry and intact. No rash  noted.  Psychiatric: Her speech is normal and behavior is normal. Judgment and thought content normal. Her mood appears not anxious. Cognition and memory are normal. She does not exhibit a depressed mood.          Assessment & Plan:

## 2011-04-05 ENCOUNTER — Ambulatory Visit: Payer: Self-pay | Admitting: Family Medicine

## 2011-04-05 DIAGNOSIS — N390 Urinary tract infection, site not specified: Secondary | ICD-10-CM | POA: Insufficient documentation

## 2011-04-05 DIAGNOSIS — K2970 Gastritis, unspecified, without bleeding: Secondary | ICD-10-CM | POA: Insufficient documentation

## 2011-04-05 DIAGNOSIS — R55 Syncope and collapse: Secondary | ICD-10-CM | POA: Insufficient documentation

## 2011-04-05 DIAGNOSIS — M542 Cervicalgia: Secondary | ICD-10-CM | POA: Insufficient documentation

## 2011-04-05 DIAGNOSIS — H9313 Tinnitus, bilateral: Secondary | ICD-10-CM | POA: Insufficient documentation

## 2011-04-05 NOTE — Assessment & Plan Note (Signed)
Likely trapezius muscle strain from syncopal event. NSAIDs, heat, massage, gentle stretching.

## 2011-04-05 NOTE — Assessment & Plan Note (Signed)
Improved on PPI, continue for another few weeks. Info on diet changes given.

## 2011-04-05 NOTE — Assessment & Plan Note (Signed)
Resolving on cipro.

## 2011-04-05 NOTE — Assessment & Plan Note (Signed)
Followed by Urologist. Has an upcoming appt in 1 week.

## 2011-04-05 NOTE — Assessment & Plan Note (Signed)
Likely due to over medicaiton form narcotics on night of ER visit and possible low BP.

## 2011-04-05 NOTE — Assessment & Plan Note (Signed)
Tinnitus and headhache liekly due to cipro.. Pt not interested in changing antibiotics.

## 2011-04-06 ENCOUNTER — Other Ambulatory Visit: Payer: Self-pay | Admitting: Family Medicine

## 2011-05-30 ENCOUNTER — Ambulatory Visit: Payer: Self-pay | Admitting: Ophthalmology

## 2011-05-30 LAB — CBC AND DIFFERENTIAL: HCT: 34 % — AB (ref 36–46)

## 2011-06-06 ENCOUNTER — Other Ambulatory Visit: Payer: Self-pay | Admitting: Family Medicine

## 2011-06-10 ENCOUNTER — Ambulatory Visit: Payer: Self-pay | Admitting: Ophthalmology

## 2011-06-18 ENCOUNTER — Telehealth: Payer: Self-pay | Admitting: Family Medicine

## 2011-06-18 DIAGNOSIS — E7800 Pure hypercholesterolemia, unspecified: Secondary | ICD-10-CM

## 2011-06-18 DIAGNOSIS — D509 Iron deficiency anemia, unspecified: Secondary | ICD-10-CM

## 2011-06-18 NOTE — Telephone Encounter (Signed)
Message copied by Excell Seltzer on Tue Jun 18, 2011 10:22 AM ------      Message from: Alvina Chou      Created: Fri Jun 14, 2011 11:27 AM      Regarding: orders for 12-21       Patient is scheduled for CPX labs, please order future labs, Thanks , Camelia Eng

## 2011-06-21 ENCOUNTER — Other Ambulatory Visit (INDEPENDENT_AMBULATORY_CARE_PROVIDER_SITE_OTHER): Payer: BC Managed Care – PPO

## 2011-06-21 ENCOUNTER — Encounter: Payer: Self-pay | Admitting: Family Medicine

## 2011-06-21 DIAGNOSIS — E7800 Pure hypercholesterolemia, unspecified: Secondary | ICD-10-CM

## 2011-06-21 DIAGNOSIS — D509 Iron deficiency anemia, unspecified: Secondary | ICD-10-CM

## 2011-06-21 LAB — LIPID PANEL
Cholesterol: 206 mg/dL — ABNORMAL HIGH (ref 0–200)
HDL: 60.7 mg/dL (ref >39.00)
Total CHOL/HDL Ratio: 3
Triglycerides: 64 mg/dL (ref 0.0–149.0)
VLDL: 12.8 mg/dL (ref 0.0–40.0)

## 2011-06-21 LAB — CBC WITH DIFFERENTIAL/PLATELET
Eosinophils Absolute: 0.1 10*3/uL (ref 0.0–0.7)
HCT: 34.2 % — ABNORMAL LOW (ref 36.0–46.0)
MCHC: 33.2 g/dL (ref 30.0–36.0)
MCV: 82.4 fl (ref 78.0–100.0)
Monocytes Absolute: 0.4 10*3/uL (ref 0.1–1.0)
Monocytes Relative: 6.4 % (ref 3.0–12.0)
Neutro Abs: 3.6 10*3/uL (ref 1.4–7.7)
RDW: 14.9 % — ABNORMAL HIGH (ref 11.5–14.6)
WBC: 6.1 10*3/uL (ref 4.5–10.5)

## 2011-06-21 LAB — COMPREHENSIVE METABOLIC PANEL
Albumin: 4.6 g/dL (ref 3.5–5.2)
Alkaline Phosphatase: 46 U/L (ref 39–117)
BUN: 15 mg/dL (ref 6–23)
CO2: 32 mEq/L (ref 19–32)
Chloride: 103 mEq/L (ref 96–112)
Creatinine, Ser: 0.9 mg/dL (ref 0.4–1.2)
GFR: 77.64 mL/min (ref >60.00)
Potassium: 3.4 mEq/L — ABNORMAL LOW (ref 3.5–5.1)
Sodium: 143 mEq/L (ref 135–145)

## 2011-07-05 ENCOUNTER — Ambulatory Visit (INDEPENDENT_AMBULATORY_CARE_PROVIDER_SITE_OTHER): Payer: Medicare Other | Admitting: Family Medicine

## 2011-07-05 ENCOUNTER — Ambulatory Visit: Payer: Self-pay | Admitting: Urology

## 2011-07-05 ENCOUNTER — Encounter: Payer: Self-pay | Admitting: Family Medicine

## 2011-07-05 VITALS — BP 108/60 | HR 65 | Temp 97.7°F | Ht 64.0 in | Wt 138.0 lb

## 2011-07-05 DIAGNOSIS — F321 Major depressive disorder, single episode, moderate: Secondary | ICD-10-CM

## 2011-07-05 DIAGNOSIS — Z23 Encounter for immunization: Secondary | ICD-10-CM

## 2011-07-05 DIAGNOSIS — Z1231 Encounter for screening mammogram for malignant neoplasm of breast: Secondary | ICD-10-CM

## 2011-07-05 DIAGNOSIS — G609 Hereditary and idiopathic neuropathy, unspecified: Secondary | ICD-10-CM

## 2011-07-05 DIAGNOSIS — M810 Age-related osteoporosis without current pathological fracture: Secondary | ICD-10-CM

## 2011-07-05 DIAGNOSIS — G629 Polyneuropathy, unspecified: Secondary | ICD-10-CM

## 2011-07-05 DIAGNOSIS — I10 Essential (primary) hypertension: Secondary | ICD-10-CM

## 2011-07-05 DIAGNOSIS — Z0000 Encounter for general adult medical examination without abnormal findings: Secondary | ICD-10-CM

## 2011-07-05 DIAGNOSIS — IMO0001 Reserved for inherently not codable concepts without codable children: Secondary | ICD-10-CM

## 2011-07-05 DIAGNOSIS — E7800 Pure hypercholesterolemia, unspecified: Secondary | ICD-10-CM

## 2011-07-05 LAB — BASIC METABOLIC PANEL
BUN: 16 mg/dL (ref 6–23)
CO2: 32 mEq/L (ref 19–32)
Chloride: 103 mEq/L (ref 96–112)
GFR: 81.63 mL/min (ref >60.00)

## 2011-07-05 LAB — TSH: TSH: 0.74 u[IU]/mL (ref 0.35–5.50)

## 2011-07-05 LAB — VITAMIN B12: Vitamin B-12: 474 pg/mL (ref 211–911)

## 2011-07-05 NOTE — Progress Notes (Signed)
Subjective:    Patient ID: Shannon Hancock, female    DOB: 20-May-1951, 61 y.o.   MRN: [id number]  HPI  I have personally reviewed the Medicare Annual Wellness questionnaire and have noted 1. The patient's medical and social history 2. Their use of alcohol, tobacco or illicit drugs 3. Their current medications and supplements 4. The patient's functional ability including ADL's, fall risks, home safety risks and hearing or visual             impairment. 5. Diet and physical activities 6. Evidence for depression or mood disorders The patients weight, height, BMI and visual acuity have been recorded in the chart I have made referrals, counseling and provided education to the patient based review of the above and I have provided the pt with a written personalized care plan for preventive services.    Chronic interstitial cystitis, chronic UTI...  Enabelex was not helping. Saw Dr. Jamelle Rushing day before. Changed to vesicare, renal US was done, results not back yet  Fibromyalgia: On lyrica and skelaxin. Vitamin supplements.  Hypertension:  Well controlled on losartan/HCTZ.  Using medication without problems or lightheadedness:  Chest pain with exertion:None Edema:None Short of breath:None Average home BPs: 120/70 Other issues:  Elevated Cholesterol: LDL at goal <130 on zetia but not taking regularly Using medications without problems: None Muscle aches: None Diet compliance:Moderate Exercise:lImited.. Plans to restart Yoga. Other complaints:  Tingling in bottom of feet x 1 year. No pain/buring.  No ETOH use.     Review of Systems  Constitutional: Negative for fever and fatigue.  HENT: Negative for ear pain.   Eyes: Negative for pain.  Respiratory: Negative for chest tightness and shortness of breath.   Cardiovascular: Negative for chest pain, palpitations and leg swelling.  Gastrointestinal: Negative for abdominal pain.  Genitourinary: Negative for dysuria.         Objective:   Physical Exam  Constitutional: Vital signs are normal. She appears well-developed and well-nourished. She is cooperative.  Non-toxic appearance. She does not appear ill. No distress.  HENT:  Head: Normocephalic.  Right Ear: Hearing, tympanic membrane, external ear and ear canal normal.  Left Ear: Hearing, tympanic membrane, external ear and ear canal normal.  Nose: Nose normal.  Eyes: Conjunctivae, EOM and lids are normal. Pupils are equal, round, and reactive to light. No foreign bodies found.  Neck: Trachea normal and normal range of motion. Neck supple. Carotid bruit is not present. No mass and no thyromegaly present.  Cardiovascular: Normal rate, regular rhythm, S1 normal, S2 normal, normal heart sounds and intact distal pulses.  Exam reveals no gallop.   No murmur heard. Pulmonary/Chest: Effort normal and breath sounds normal. No respiratory distress. She has no wheezes. She has no rhonchi. She has no rales.  Abdominal: Soft. Normal appearance and bowel sounds are normal. She exhibits no distension, no fluid wave, no abdominal bruit and no mass. There is no hepatosplenomegaly. There is no tenderness. There is no rebound, no guarding and no CVA tenderness. No hernia.  Genitourinary:       DVE/breast exam per GYN  Lymphadenopathy:    She has no cervical adenopathy.    She has no axillary adenopathy.  Neurological: She is alert. She has normal strength and normal reflexes. No cranial nerve deficit or sensory deficit. She displays a negative Romberg sign.       No low back ttp focally, but diffuse body pain at trigger points.  Skin: Skin is warm, dry and intact. No rash  noted.  Psychiatric: Her speech is normal and behavior is normal. Judgment normal. Her mood appears not anxious. Cognition and memory are normal. She does not exhibit a depressed mood.          Assessment & Plan:  AMW: The patient's preventative maintenance and recommended screening tests for an annual  wellness exam were reviewed in full today. Brought up to date unless services declined.  Counselled on the importance of diet, exercise, and its role in overall health and mortality. The patient's FH and SH was reviewed, including their home life, tobacco status, and drug and alcohol status.   Vaccines: Uptodate with TD, flu  given. Consider shingles vaccine. Mammo: Overdue.Breast exam  Per GYN.  DVE/PAP: Per GYN. Colon: Last in 2007... Due in 10 years.  DXA, due. On boniva for osteoporosis.

## 2011-07-05 NOTE — Assessment & Plan Note (Signed)
Well controlled. Continue current medication.  

## 2011-07-05 NOTE — Assessment & Plan Note (Signed)
Stable moderate control. Using only xanax prn.

## 2011-07-05 NOTE — Patient Instructions (Addendum)
Yoga as planned. Stop at front desk to set up bone density , mammogram. We will call with lab evaluation for peripheral neuropathy and will let you know next step. Call insurance about shingles vaccine coverage. Look into setting up living will.  Follow up for CPX in 1 year, earlier if needed.

## 2011-07-05 NOTE — Assessment & Plan Note (Signed)
On boniva, due for repeat DXA.

## 2011-07-05 NOTE — Assessment & Plan Note (Signed)
Well controlled on zetia

## 2011-07-05 NOTE — Assessment & Plan Note (Signed)
No suggestion of central source. Will eval B12 and TSH. No ETOH use. No DM.

## 2011-07-05 NOTE — Assessment & Plan Note (Signed)
Stable on current meds.Marland Kitchen lyrica and skelaxin.

## 2011-07-08 NOTE — Progress Notes (Signed)
Addended by: Kerby Nora E on: 07/08/2011 04:20 PM   Modules accepted: Orders

## 2011-07-19 ENCOUNTER — Other Ambulatory Visit: Payer: Medicare Other

## 2011-07-22 ENCOUNTER — Other Ambulatory Visit: Payer: BC Managed Care – PPO

## 2011-07-24 ENCOUNTER — Other Ambulatory Visit: Payer: BC Managed Care – PPO

## 2011-08-05 ENCOUNTER — Other Ambulatory Visit: Payer: Self-pay | Admitting: Family Medicine

## 2011-09-11 ENCOUNTER — Telehealth: Payer: Self-pay | Admitting: Family Medicine

## 2011-09-11 NOTE — Telephone Encounter (Signed)
Triage Record Num: [id number] Operator: Durward Mallard DiMatteis Patient Name: Shannon Hancock Call Date & Time: 09/11/2011 9:39:53AM Patient Phone: [phone] PCP: Kerby Nora Patient Gender: Female PCP Fax : [phone] Patient DOB: [birth year] Practice Name: [facility] Reason for Call: Pt is calling and states that she is having chest pain in the mis center of her chest; started 2 months ago; worse if she lays down; no pain now; had last episode was 09/10/11; antacids seem to help the pain; Triaged per Chest Pain Guideline; See in 24 hr d/t one or more occurances of unexplained pain and lasting more than a few minutes and not been evaluated;appt made for tomorrow at 10:15am per office staff; instructed to call back or 911 if has chest pain again; will comply Protocol(s) Used: Chest Pain Recommended Outcome per Protocol: See Provider within 24 hours Reason for Outcome: One or more occurrences of unexplained pain in shoulders, neck, jaw, in one or both arms, stomach or back lasting more than a few minutes that has not been evaluated by a healthcare provider and has risk factors for cardiovascular disease. Care Advice: ~ Call provider if symptoms worsen or new symptoms develop. 09/11/2011 9:56:05AM Page 1 of 1 [id number]

## 2011-09-11 NOTE — Telephone Encounter (Signed)
To: [facility] (Daytime Triage) Fax: [phone] From: [facility] Date/ Time: 09/11/2011 9:25 AM Taken By: Geanie Berlin, RN Caller: Kendal Hymen Facility: Not Collected Patient: Shannon, Hancock DOB: 1950/12/29 Phone: [phone] Reason for Call: Caller was unable to be reached on callback - No Answer Regarding Appointment: No Appt Date: Appt Time: Unknown Provider: Reason: Details: Outcome:

## 2011-09-12 ENCOUNTER — Ambulatory Visit: Payer: Self-pay | Admitting: Family Medicine

## 2011-09-12 ENCOUNTER — Ambulatory Visit (INDEPENDENT_AMBULATORY_CARE_PROVIDER_SITE_OTHER): Payer: Medicare Other | Admitting: Family Medicine

## 2011-09-12 ENCOUNTER — Encounter: Payer: Self-pay | Admitting: Family Medicine

## 2011-09-12 DIAGNOSIS — R079 Chest pain, unspecified: Secondary | ICD-10-CM | POA: Insufficient documentation

## 2011-09-12 LAB — HM MAMMOGRAPHY: HM Mammogram: NORMAL

## 2011-09-12 MED ORDER — ESOMEPRAZOLE MAGNESIUM 40 MG PO CPDR: 40.0000 mg | DELAYED_RELEASE_CAPSULE | Freq: Every day | ORAL | Status: DC

## 2011-09-12 NOTE — Patient Instructions (Addendum)
Nice to meet you. Your EKG was very reassuring that this is not a heart issue but please let us know if your symptoms worsen or change. Increase fiber and water, and try over the counter gas-x or beano for bloating.  Let's try Nexium mg daily for next 2 weeks. Call us in two weeks with an update of your symptoms.

## 2011-09-12 NOTE — Progress Notes (Signed)
Subjective:    Patient ID: Shannon Hancock, female    DOB: August 25, 1950, 61 y.o.   MRN: [id number]  HPI 61 yo pt of Dr. Ermalene Searing new to me here for:  Epigastric pain- ongoing for several months, now getting more frequent. Usually occurs after eating, especially acid foods like oranges. Does not radiate to arm or neck but sometimes gets acid burning in throat. No abdominal pain. No nausea or vomiting. She does drink coffee regularly. She is a non smoker.  Also feels very bloated and gasy.  EKG NSR. Lab Results  Component Value Date   CHOL 206* 06/21/2011   HDL 60.70 06/21/2011   LDLCALC 120* 04/05/2010   LDLDIRECT 126.4 06/21/2011   TRIG 64.0 06/21/2011   CHOLHDL 3 06/21/2011   Alka seltzer does help with symptoms.    Patient Active Problem List  Diagnoses  . HYPERCHOLESTEROLEMIA  . ANEMIA, IRON DEFICIENCY  . DEPRESSION, MAJOR, MODERATE  . HYPERTENSION  . ALLERGIC RHINITIS  . Chronic interstitial cystitis with hematuria  . POSTMENOPAUSAL SYNDROME  . DEGENERATIVE JOINT DISEASE, CERVICAL SPINE  . FIBROMYALGIA  . Tinnitus  . Peripheral neuropathy  . Osteoporosis   Past Medical History  Diagnosis Date  . Allergy   . Hypertension   . Fibromyalgia    Past Surgical History  Procedure Date  . Tubal ligation   . Foot surgery    History  Substance Use Topics  . Smoking status: Former Games developer  . Smokeless tobacco: Not on file  . Alcohol Use: No   Family History  Problem Relation Age of Onset  . Alzheimer's disease Mother   . Coronary artery disease Father   . Hypertension Father   . Depression Father   . Cancer Father     prostate  . Cancer Sister     breast  . Cancer Brother     prostate  . Hypertension Brother   . Hyperlipidemia Brother    Allergies  Allergen Reactions  . Aspirin     Make stomach upset   Current Outpatient Prescriptions on File Prior to Visit  Medication Sig Dispense Refill  . ALPRAZolam (XANAX) 1 MG tablet Take 1 mg by mouth 4  (four) times daily.        Marland Kitchen ezetimibe (ZETIA) 10 MG tablet Take 10 mg by mouth 2 (two) times daily.        . fexofenadine (ALLEGRA) 180 MG tablet Take 180 mg by mouth daily.        . hyoscyamine (LEVSIN SL) 0.125 MG SL tablet Place 0.125 mg under the tongue every 4 (four) hours as needed.        . ibandronate (BONIVA) 150 MG tablet Take 150 mg by mouth every 30 (thirty) days. Take in the morning with a full glass of water, on an empty stomach, and do not take anything else by mouth or lie down for the next 30 min.       Marland Kitchen losartan-hydrochlorothiazide (HYZAAR) 100-12.5 MG per tablet TAKE 1 TABLET BY MOUTH DAILY  30 tablet  1  . metaxalone (SKELAXIN) 800 MG tablet Take 800 mg by mouth 3 (three) times daily.        . pregabalin (LYRICA) 100 MG capsule Take 100 mg by mouth 2 (two) times daily.        . solifenacin (VESICARE) 10 MG tablet Take 5 mg by mouth daily.         The PMH, PSH, Social History, Family History, Medications, and allergies have  been reviewed in [facility], and have been updated if relevant.  Review of Systems    See HPI Objective:   Physical Exam BP 120/70  Pulse 76  Temp(Src) 97.9 F (36.6 C) (Oral)  Wt 141 lb (63.957 kg)  General:  Well-developed,well-nourished,in no acute distress; alert,appropriate and cooperative throughout examination Head:  normocephalic and atraumatic.   Eyes:  vision grossly intact, pupils equal, pupils round, and pupils reactive to light.   Ears:  R ear normal and L ear normal.   Nose:  no external deformity.   Mouth:  good dentition.   Neck:  No deformities, masses, or tenderness noted. Abdomen:  Bowel sounds positive,abdomen soft and non-tender without masses, organomegaly or hernias noted, neg murphy's sign Msk:  No deformity or scoliosis noted of thoracic or lumbar spine.   Extremities:  No clubbing, cyanosis, edema, or deformity noted with normal full range of motion of all joints.   Neurologic:  alert & oriented X3 and gait normal.   Skin:   Intact without suspicious lesions or rashes Psych:  Cognition and judgment appear intact. Alert and cooperative with normal attention span and concentration. No apparent delusions, illusions, hallucinations     Assessment & Plan:   1. Chest pain    New- does not appear cardiac in nature and EKG reassuring- NSR. GERD is at top of the differential. Discussed cutting back on acid foods and beverages. Given Nexium 40 mg tablets samples to try one daily for 2 weeks. If no improvement, need to consider GI referral for possible endoscopy- r/u hiatal hernia, PUD, etc. Also having some symptoms of IBS- see pt instructions for supportive care.

## 2011-09-17 ENCOUNTER — Encounter: Payer: Self-pay | Admitting: *Deleted

## 2011-09-17 ENCOUNTER — Encounter: Payer: Self-pay | Admitting: Family Medicine

## 2011-10-15 ENCOUNTER — Other Ambulatory Visit: Payer: Self-pay | Admitting: Family Medicine

## 2011-11-07 ENCOUNTER — Ambulatory Visit: Payer: Medicare Other | Admitting: Family Medicine

## 2011-11-07 ENCOUNTER — Encounter: Payer: Self-pay | Admitting: Family Medicine

## 2011-11-07 ENCOUNTER — Ambulatory Visit (INDEPENDENT_AMBULATORY_CARE_PROVIDER_SITE_OTHER): Payer: Medicare Other | Admitting: Family Medicine

## 2011-11-07 VITALS — BP 120/72 | HR 85 | Temp 98.4°F | Ht 64.0 in | Wt 138.8 lb

## 2011-11-07 DIAGNOSIS — H9190 Unspecified hearing loss, unspecified ear: Secondary | ICD-10-CM | POA: Insufficient documentation

## 2011-11-07 DIAGNOSIS — H6120 Impacted cerumen, unspecified ear: Secondary | ICD-10-CM | POA: Insufficient documentation

## 2011-11-07 DIAGNOSIS — F419 Anxiety disorder, unspecified: Secondary | ICD-10-CM

## 2011-11-07 DIAGNOSIS — H9319 Tinnitus, unspecified ear: Secondary | ICD-10-CM

## 2011-11-07 DIAGNOSIS — IMO0001 Reserved for inherently not codable concepts without codable children: Secondary | ICD-10-CM

## 2011-11-07 DIAGNOSIS — F411 Generalized anxiety disorder: Secondary | ICD-10-CM | POA: Insufficient documentation

## 2011-11-07 NOTE — Assessment & Plan Note (Signed)
Complicated removal. After irrigation, forceps used to remove wax.
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
## 2011-11-07 NOTE — Assessment & Plan Note (Signed)
Stable control on skelaxin and lyrica. Lyrica helps a lot so even if causing tinnitus, she does not want to stop.

## 2011-11-07 NOTE — Assessment & Plan Note (Signed)
Hearing loss on exam. Likely cause of tinnitus versus medication.

## 2011-11-07 NOTE — Progress Notes (Signed)
  Subjective:    Patient ID: Shannon Hancock, female    DOB: [birth year], 61 y.o.   MRN: [id number]  HPI   61 year old female presents to followup on neuropathy, likely peripheral.  She reports that this is not her main issue of concern today. Lab eval showed nml B12, TSH, potassium was slightly low. She never returned for K recheck. Planned nerve conduction, never had. If she wears shoes or sock does not  Occur. Not interested in nerve conduction at this point.  Fibromyalgia: on lyrica and skelaxin.  Has noted ringing in ears.. Crickets in both ears. She has noticed mild  hearing loss. No vertigo.  Mother with history of rining in ears.  No heartbeat.   She had previously been seeing Dr. Raquel James Psych for depression/anxiety and fibro.  Sees every three months. It is far away and wants [place] to prescribe skelaxin, lyrica, alprazolam instead. She states it is well controlled. Uses alprazolam 1 mg at night for sleep and one-two as needed in morning. Has been on this med for 8-9 years. Tried wellbutrin , cymbalta, SSRIs...but had SE. These issues are well controlled.     Review of Systems  Constitutional: Negative for fever and fatigue.  HENT: Positive for tinnitus. Negative for ear pain and congestion.   Eyes: Negative for pain.  Respiratory: Negative for chest tightness and shortness of breath.   Cardiovascular: Negative for chest pain, palpitations and leg swelling.  Gastrointestinal: Negative for abdominal pain.  Genitourinary: Negative for dysuria.  Psychiatric/Behavioral: Positive for agitation. Negative for dysphoric mood and decreased concentration. The patient is nervous/anxious.        Objective:   Physical Exam  Constitutional: Vital signs are normal. She appears well-developed and well-nourished. She is cooperative.  Non-toxic appearance. She does not appear ill. No distress.  HENT:  Head: Normocephalic.  Right Ear: Hearing, tympanic membrane, external ear and ear  canal normal. Tympanic membrane is not erythematous, not retracted and not bulging.  Left Ear: Hearing, tympanic membrane, external ear and ear canal normal. Tympanic membrane is not erythematous, not retracted and not bulging.  Nose: No mucosal edema or rhinorrhea. Right sinus exhibits no maxillary sinus tenderness and no frontal sinus tenderness. Left sinus exhibits no maxillary sinus tenderness and no frontal sinus tenderness.  Mouth/Throat: Uvula is midline, oropharynx is clear and moist and mucous membranes are normal.       Cerumen impaction left ear.. irrigated  Eyes: Conjunctivae, EOM and lids are normal. Pupils are equal, round, and reactive to light. No foreign bodies found.  Neck: Trachea normal and normal range of motion. Neck supple. Carotid bruit is not present. No mass and no thyromegaly present.  Cardiovascular: Normal rate, regular rhythm, S1 normal, S2 normal, normal heart sounds, intact distal pulses and normal pulses.  Exam reveals no gallop and no friction rub.   No murmur heard. Pulmonary/Chest: Effort normal and breath sounds normal. Not tachypneic. No respiratory distress. She has no decreased breath sounds. She has no wheezes. She has no rhonchi. She has no rales.  Abdominal: Soft. Normal appearance and bowel sounds are normal. There is no tenderness.  Neurological: She is alert.  Skin: Skin is warm, dry and intact. No rash noted.  Psychiatric: Her speech is normal and behavior is normal. Judgment and thought content normal. Her mood appears not anxious. Cognition and memory are normal. She does not exhibit a depressed mood.          Assessment & Plan:

## 2011-11-07 NOTE — Assessment & Plan Note (Signed)
Offer audiology referral, pt not interested. Likely hereditary vs. due to noise exposure.

## 2011-11-07 NOTE — Patient Instructions (Addendum)
Stop by front desk to bet pamphlet and number for Evalina Field.. No referral at this time. We will call you when we get records from Dr. Raquel James to okay the refills of alprazolam .. Will next be needed 11/21/2011.  Follow up for mood in 3 months.

## 2011-11-18 ENCOUNTER — Other Ambulatory Visit: Payer: Self-pay | Admitting: Family Medicine

## 2011-11-19 ENCOUNTER — Ambulatory Visit: Payer: Medicare Other | Admitting: Family Medicine

## 2011-11-19 DIAGNOSIS — Z0289 Encounter for other administrative examinations: Secondary | ICD-10-CM

## 2011-12-06 ENCOUNTER — Telehealth: Payer: Self-pay | Admitting: Family Medicine

## 2011-12-06 DIAGNOSIS — F419 Anxiety disorder, unspecified: Secondary | ICD-10-CM

## 2011-12-06 MED ORDER — ALPRAZOLAM 1 MG PO TABS: 1.0000 mg | ORAL_TABLET | Freq: Four times a day (QID) | ORAL | Status: DC

## 2011-12-06 NOTE — Telephone Encounter (Signed)
To my knowledge, no records have been received.  She is now calling wanting the refill, saying that she didn't keep the last appt with Dr. Raquel James thinking you would be prescribing the medication.  Now she's down to 4 or 5 pills remaining.

## 2011-12-06 NOTE — Telephone Encounter (Signed)
Yes, plan was for  Me to refill but i was to get records to review first. Please re-request records (should have form in EMR to re-send).. I will fill this month  (not pt fault we didi';t get records) but I need to get those reocrds for further refills.

## 2011-12-06 NOTE — Telephone Encounter (Signed)
Patient is requesting a refill of xanax to be sent to [facility] on [address]   Also, patient is requesting that Dr. Ermalene Searing refers her to the psychologist that comes here to this location

## 2011-12-06 NOTE — Telephone Encounter (Signed)
Have we gotten the records form Dr. Debarah Crape office .Marland Kitchen Cannot fill alprazolam until I review these. Please look in EMR (I don't see them) and if not found call and rerequest urgently.

## 2011-12-06 NOTE — Telephone Encounter (Signed)
Medication phoned to pharmacy.  Records re-requested from Dr. Debarah Crape office.

## 2011-12-13 ENCOUNTER — Encounter (HOSPITAL_COMMUNITY): Payer: Self-pay

## 2011-12-13 ENCOUNTER — Emergency Department (INDEPENDENT_AMBULATORY_CARE_PROVIDER_SITE_OTHER)
Admission: EM | Admit: 2011-12-13 | Discharge: 2011-12-13 | Disposition: A | Discharge status: Home / Self Care | Payer: Medicare Other | Source: Home / Self Care | Attending: Emergency Medicine | Admitting: Emergency Medicine

## 2011-12-13 ENCOUNTER — Emergency Department (INDEPENDENT_AMBULATORY_CARE_PROVIDER_SITE_OTHER): Payer: Self-pay

## 2011-12-13 DIAGNOSIS — S139XXA Sprain of joints and ligaments of unspecified parts of neck, initial encounter: Secondary | ICD-10-CM

## 2011-12-13 DIAGNOSIS — S161XXA Strain of muscle, fascia and tendon at neck level, initial encounter: Secondary | ICD-10-CM

## 2011-12-13 MED ORDER — IBUPROFEN 800 MG PO TABS
ORAL_TABLET | ORAL | Status: AC
  Filled 2011-12-13: qty 1

## 2011-12-13 MED ORDER — IBUPROFEN 800 MG PO TABS: 800.0000 mg | ORAL_TABLET | Freq: Three times a day (TID) | ORAL | Status: AC | PRN

## 2011-12-13 MED ORDER — IBUPROFEN 800 MG PO TABS
800.0000 mg | ORAL_TABLET | Freq: Once | ORAL | Status: AC
  Administered 2011-12-13: 800 mg via ORAL

## 2011-12-13 NOTE — ED Provider Notes (Signed)
Medical screening examination/treatment/procedure(s) were performed by non-physician practitioner and as supervising physician I was immediately available for consultation/collaboration.  Desare Duddy, M.D.   Laurali Goddard C Kathrene Sinopoli, MD 12/13/11 2150 
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
## 2011-12-14 ENCOUNTER — Other Ambulatory Visit: Payer: Self-pay | Admitting: Family Medicine

## 2011-12-16 NOTE — Telephone Encounter (Signed)
Received refill request electronically from pharmacy. Refill request for Zetia shows once daily which does not match the med sheet which shows two times a day. Spoke to patient and was advised that she is taking Zetia once daily. Is it okay to refill medication?  Was advised by patient that she will contact the doctor that prescribes her xanax and find out why you have not gotten the notes yet.  Patient stated that she had talked with you about this because she wants you to start prescribing xanax for her because she does not want to have to drive to [place].

## 2011-12-17 MED ORDER — EZETIMIBE 10 MG PO TABS: 10.0000 mg | ORAL_TABLET | Freq: Every day | ORAL | Status: DC

## 2011-12-17 NOTE — Telephone Encounter (Signed)
Yes.. Refill zetia once daily.Marland Kitchen it is never prescribed twice daily , this was and entry error.

## 2011-12-30 ENCOUNTER — Telehealth: Payer: Self-pay | Admitting: *Deleted

## 2011-12-30 NOTE — Telephone Encounter (Signed)
Please rerequest these records.. Just last note. Call office as well to request sent before 7/7.

## 2011-12-30 NOTE — Telephone Encounter (Signed)
Patient called stating that she is going to be due to get a refill on her Xanax 01/05/12. Patient states that you wanted a copy of her records from Dr. Raquel James and she has called her office and they have not sent them to you. Patient states that you had agreed to refill her Xanax one time until you received the records. Patient wants to know if you can request the records since they have not been sent to you yet? Patient plans on seeing the Dr. Laymond Purser at [place]. Please advise.

## 2011-12-31 NOTE — Telephone Encounter (Signed)
Records requested before 7-7 so we can refill her medication. Office advised we needed these before 7-7 so patient doesn't run out of medication

## 2011-12-31 NOTE — Telephone Encounter (Signed)
Telephone number [phone]

## 2012-01-03 ENCOUNTER — Other Ambulatory Visit: Payer: Self-pay | Admitting: *Deleted

## 2012-01-03 MED ORDER — ALPRAZOLAM 1 MG PO TABS: 1.0000 mg | ORAL_TABLET | Freq: Four times a day (QID) | ORAL | Status: DC | PRN

## 2012-01-03 NOTE — Telephone Encounter (Signed)
Reviewed OV not e from previous MD. Please ask question as I wrote on note about klopnopin.. If pt wants alprzolam still.. Go ahead and fil as enetered.

## 2012-01-03 NOTE — Telephone Encounter (Signed)
Spoke with patient and she does want alprazolam and she said she was getting 120 a month from other md

## 2012-01-03 NOTE — Telephone Encounter (Signed)
Rx called to pharmacy

## 2012-01-08 ENCOUNTER — Ambulatory Visit: Payer: Self-pay | Admitting: Psychology

## 2012-02-05 ENCOUNTER — Other Ambulatory Visit: Payer: Self-pay | Admitting: *Deleted

## 2012-02-05 MED ORDER — PREGABALIN 100 MG PO CAPS: 100.0000 mg | ORAL_CAPSULE | Freq: Two times a day (BID) | ORAL | Status: DC

## 2012-02-06 ENCOUNTER — Other Ambulatory Visit: Payer: Self-pay | Admitting: *Deleted

## 2012-02-06 MED ORDER — ALPRAZOLAM 1 MG PO TABS: ORAL_TABLET | ORAL | Status: DC

## 2012-02-06 NOTE — Telephone Encounter (Signed)
Faxed refill request from [facility]. 

## 2012-02-06 NOTE — Telephone Encounter (Signed)
rx called to pharmacy 

## 2012-02-07 NOTE — Telephone Encounter (Signed)
rx called to pharmacy 

## 2012-03-23 ENCOUNTER — Other Ambulatory Visit: Payer: Self-pay | Admitting: *Deleted

## 2012-03-23 MED ORDER — ALPRAZOLAM 1 MG PO TABS: ORAL_TABLET | ORAL | Status: DC

## 2012-03-24 ENCOUNTER — Other Ambulatory Visit: Payer: Self-pay | Admitting: Family Medicine

## 2012-03-24 NOTE — Telephone Encounter (Signed)
Rx Xanax 1 mg called in to [facility]

## 2012-03-24 NOTE — Telephone Encounter (Signed)
[facility] with [facility] told alprazolam was not called in. Spoke with pharmacist at [facility] med will be ready for pick up with [facility] med also tomorrow morning. Pt advised and understood.

## 2012-03-24 NOTE — Telephone Encounter (Signed)
[facility] request refill Skelaxin. Pt going out of town 03/25/12.Please advise.

## 2012-03-25 ENCOUNTER — Other Ambulatory Visit: Payer: Self-pay

## 2012-03-26 ENCOUNTER — Ambulatory Visit (INDEPENDENT_AMBULATORY_CARE_PROVIDER_SITE_OTHER): Payer: Medicare Other | Admitting: Psychology

## 2012-03-26 DIAGNOSIS — F4323 Adjustment disorder with mixed anxiety and depressed mood: Secondary | ICD-10-CM

## 2012-04-28 ENCOUNTER — Ambulatory Visit (INDEPENDENT_AMBULATORY_CARE_PROVIDER_SITE_OTHER): Payer: Medicare Other | Admitting: Psychology

## 2012-04-28 DIAGNOSIS — F4323 Adjustment disorder with mixed anxiety and depressed mood: Secondary | ICD-10-CM

## 2012-05-04 ENCOUNTER — Other Ambulatory Visit: Payer: Self-pay

## 2012-05-04 NOTE — Telephone Encounter (Signed)
pt left v/m requesting alprazolam refill to [facility]. Pt request more than one refill be given.Please advise.

## 2012-05-05 MED ORDER — ALPRAZOLAM 1 MG PO TABS: ORAL_TABLET | ORAL | Status: DC

## 2012-05-05 NOTE — Telephone Encounter (Signed)
[facility]. Left message on voicemail to call back.

## 2012-05-05 NOTE — Telephone Encounter (Signed)
Given the fact that it is a controlled substance and the amount she is using, I do not refill more than once a month.

## 2012-05-06 ENCOUNTER — Other Ambulatory Visit: Payer: Self-pay | Admitting: Family Medicine

## 2012-05-13 ENCOUNTER — Ambulatory Visit: Payer: Self-pay | Admitting: Psychology

## 2012-05-20 ENCOUNTER — Ambulatory Visit: Payer: Self-pay | Admitting: Psychology

## 2012-06-03 ENCOUNTER — Ambulatory Visit: Payer: Self-pay

## 2012-06-10 ENCOUNTER — Other Ambulatory Visit: Payer: Self-pay | Admitting: Family Medicine

## 2012-06-10 MED ORDER — EZETIMIBE 10 MG PO TABS: 10.0000 mg | ORAL_TABLET | Freq: Every day | ORAL | Status: DC

## 2012-06-10 NOTE — Telephone Encounter (Signed)
Pt request refill alprazolam and zetia(refilled zetia and pt notified done). Pt said she is almost out of Alprazolam and does not have enough med to last until 06/12/12. Pt request call back when Alprazolam is refilled.

## 2012-06-11 ENCOUNTER — Other Ambulatory Visit: Payer: Self-pay | Admitting: Family Medicine

## 2012-06-11 NOTE — Telephone Encounter (Signed)
Patient advised and rx called to pharmacy  

## 2012-07-10 ENCOUNTER — Encounter: Payer: Self-pay | Admitting: Family Medicine

## 2012-07-10 ENCOUNTER — Ambulatory Visit (INDEPENDENT_AMBULATORY_CARE_PROVIDER_SITE_OTHER): Payer: Medicare Other | Admitting: Family Medicine

## 2012-07-10 VITALS — BP 120/72 | HR 73 | Temp 98.3°F | Ht 64.0 in | Wt 139.2 lb

## 2012-07-10 DIAGNOSIS — IMO0001 Reserved for inherently not codable concepts without codable children: Secondary | ICD-10-CM

## 2012-07-10 DIAGNOSIS — M542 Cervicalgia: Secondary | ICD-10-CM

## 2012-07-10 DIAGNOSIS — R0789 Other chest pain: Secondary | ICD-10-CM | POA: Insufficient documentation

## 2012-07-10 DIAGNOSIS — R071 Chest pain on breathing: Secondary | ICD-10-CM

## 2012-07-10 MED ORDER — IBANDRONATE SODIUM 150 MG PO TABS: ORAL_TABLET | ORAL | Status: DC

## 2012-07-10 NOTE — Patient Instructions (Addendum)
Stop at front desk for referral to PT.  Look into massage therapy. Continue aleve, lyrica and skelaxin. Can take lyrica twice a day. Heat.  Follow up if no better in 2 weeks.

## 2012-07-10 NOTE — Assessment & Plan Note (Signed)
Increase lyrica to twice a day to help with flare.
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
## 2012-07-10 NOTE — Assessment & Plan Note (Signed)
MSK strain . PT. Heat. NSAIDs.

## 2012-07-22 ENCOUNTER — Other Ambulatory Visit: Payer: Self-pay | Admitting: Family Medicine

## 2012-07-22 NOTE — Telephone Encounter (Signed)
Pt called office re refill, she request a call when rx is called in.

## 2012-07-23 ENCOUNTER — Other Ambulatory Visit: Payer: Self-pay | Admitting: *Deleted

## 2012-07-23 MED ORDER — LOSARTAN POTASSIUM-HCTZ 100-12.5 MG PO TABS: 1.0000 | ORAL_TABLET | Freq: Every day | ORAL | Status: DC

## 2012-07-23 NOTE — Telephone Encounter (Signed)
Patient is seeing dr Laymond Purser here and she says she did just lose her father in November and is more and depressed

## 2012-07-23 NOTE — Telephone Encounter (Signed)
Medication called to pharmacy now waiting on patient to return my call for further recommedations

## 2012-07-23 NOTE — Telephone Encounter (Signed)
Notify pt that she is using more alprazolam than in past... If mood not well controlled and continuing to need this amount..  I would recommend referral to psychiatry for better treatment

## 2012-07-24 NOTE — Telephone Encounter (Signed)
DONE

## 2012-07-24 NOTE — Telephone Encounter (Signed)
Alprazolam does not help with depression.. If her mood is worsening she needs to come in for appt to adjust med or ideally to see a pshychiatrist. Given her my condolences about her father. Refill until appt made or referral.

## 2012-08-05 ENCOUNTER — Other Ambulatory Visit: Payer: Self-pay | Admitting: Family Medicine

## 2012-08-06 ENCOUNTER — Encounter: Payer: Self-pay | Admitting: Family Medicine

## 2012-08-06 VITALS — Ht 64.0 in

## 2012-08-06 DIAGNOSIS — Z0289 Encounter for other administrative examinations: Secondary | ICD-10-CM

## 2012-08-07 NOTE — Progress Notes (Signed)
This encounter was created in error - please disregard.

## 2012-08-20 ENCOUNTER — Encounter: Payer: Self-pay | Admitting: Family Medicine

## 2012-08-20 ENCOUNTER — Ambulatory Visit: Payer: Medicare Other

## 2012-08-20 ENCOUNTER — Ambulatory Visit (INDEPENDENT_AMBULATORY_CARE_PROVIDER_SITE_OTHER): Payer: Medicare Other | Admitting: Family Medicine

## 2012-08-20 VITALS — BP 110/72 | HR 75 | Temp 98.5°F | Ht 64.0 in | Wt 139.5 lb

## 2012-08-20 DIAGNOSIS — D509 Iron deficiency anemia, unspecified: Secondary | ICD-10-CM

## 2012-08-20 DIAGNOSIS — R52 Pain, unspecified: Secondary | ICD-10-CM

## 2012-08-20 DIAGNOSIS — K589 Irritable bowel syndrome without diarrhea: Secondary | ICD-10-CM

## 2012-08-20 DIAGNOSIS — R109 Unspecified abdominal pain: Secondary | ICD-10-CM

## 2012-08-20 LAB — HEPATIC FUNCTION PANEL
ALT: 17 U/L (ref 0–35)
AST: 21 U/L (ref 0–37)
Albumin: 4.4 g/dL (ref 3.5–5.2)

## 2012-08-20 LAB — CBC WITH DIFFERENTIAL/PLATELET
Eosinophils Relative: 1.7 % (ref 0.0–5.0)
HCT: 33.3 % — ABNORMAL LOW (ref 36.0–46.0)
Hemoglobin: 10.8 g/dL — ABNORMAL LOW (ref 12.0–15.0)
Lymphs Abs: 1.9 10*3/uL (ref 0.7–4.0)
Monocytes Relative: 7.5 % (ref 3.0–12.0)
Neutro Abs: 4.5 10*3/uL (ref 1.4–7.7)
RDW: 14.5 % (ref 11.5–14.6)
WBC: 7.1 10*3/uL (ref 4.5–10.5)

## 2012-08-20 LAB — BASIC METABOLIC PANEL
GFR: 83.47 mL/min (ref >60.00)
Glucose, Bld: 78 mg/dL (ref 70–99)
Potassium: 4.5 mEq/L (ref 3.5–5.1)
Sodium: 142 mEq/L (ref 135–145)

## 2012-08-20 LAB — LIPASE: Lipase: 25 U/L (ref 11.0–59.0)

## 2012-08-20 MED ORDER — DICYCLOMINE HCL 20 MG PO TABS: 20.0000 mg | ORAL_TABLET | Freq: Four times a day (QID) | ORAL | Status: DC

## 2012-08-20 NOTE — Progress Notes (Signed)
Nature conservation officer at [facility] [address] [place] [address] Phone: [phone] Fax: [phone]  Date:  08/20/2012   Name:  Shannon Hancock   DOB:  [birth year]   MRN:  [id number] Gender: female Age: 62 y.o.  Primary Physician:  Kerby Nora, MD  Evaluating MD: Hannah Beat, MD   Chief Complaint: Abdominal Pain   History of Present Illness:  Shannon Hancock is a 62 y.o. pleasant patient who presents with the following:  This week, around Monday, got a phone call and it upset her and it upset her. Took some laxatives. Feels like labor pains and getting some labor pains. Given some Levsin yesterday. She went and saw her old MD: [facility], who gave her some Levsin. Hurting all the way across and some gurgling.   The patient's history is significant for very significant anxiety, fibromyalgia, interstitial cystitis, and she has been told before that she has irritable bowel syndrome. She also is up-to-date on her colonoscopy. It was negative for any inflammatory bowel disease.  No fever, chills, sweats. No nausea. Decreased appetite. Ate a hotdog wed. Nothing today but tea. Generally, she tells me that she is not a big eater at baseline.  She also has been relatively fatigued recently.  BTL only.     Patient Active Problem List  Diagnosis  . HYPERCHOLESTEROLEMIA  . ANEMIA, IRON DEFICIENCY  . DEPRESSION, MAJOR, MODERATE  . HYPERTENSION  . ALLERGIC RHINITIS  . Chronic interstitial cystitis with hematuria  . POSTMENOPAUSAL SYNDROME  . DEGENERATIVE JOINT DISEASE, CERVICAL SPINE  . FIBROMYALGIA  . Tinnitus, bilateral  . Peripheral neuropathy  . Osteoporosis  . Hearing loss  . Anxiety  . Neck pain  . Chest wall pain    Past Medical History  Diagnosis Date  . Allergy   . Hypertension   . Fibromyalgia     Past Surgical History  Procedure Laterality Date  . Tubal ligation    . Foot surgery      History  Substance Use  Topics  . Smoking status: Former Games developer  . Smokeless tobacco: Not on file  . Alcohol Use: No    Family History  Problem Relation Age of Onset  . Alzheimer's disease Mother   . Coronary artery disease Father   . Hypertension Father   . Depression Father   . Cancer Father     prostate  . Cancer Sister     breast  . Cancer Brother     prostate  . Hypertension Brother   . Hyperlipidemia Brother     Allergies  Allergen Reactions  . Aspirin     Make stomach upset    Medication list has been reviewed and updated.  Outpatient Prescriptions Prior to Visit  Medication Sig Dispense Refill  . ALPRAZolam (XANAX) 1 MG tablet TAKE 1 TABLET BY MOUTH 4 TIMES A DAY AS NEEDED FOR SLEEP OR ANXIETY  120 tablet  0  . ezetimibe (ZETIA) 10 MG tablet Take 1 tablet (10 mg total) by mouth daily.  30 tablet  3  . hyoscyamine (LEVSIN SL) 0.125 MG SL tablet Place 0.125 mg under the tongue every 4 (four) hours as needed.        . ibandronate (BONIVA) 150 MG tablet Take in the morning with a full glass of water, on an empty stomach, and do not take anything else by mouth or lie down for the next 30 min.  3 tablet  3  .  losartan-hydrochlorothiazide (HYZAAR) 100-12.5 MG per tablet Take 1 tablet by mouth daily.  30 tablet  3  . metaxalone (SKELAXIN) 800 MG tablet TAKE 1 TABLET TWICE A DAY  60 tablet  0  . omeprazole (PRILOSEC) 20 MG capsule Take 20 mg by mouth daily.      . pregabalin (LYRICA) 100 MG capsule Take 1 capsule (100 mg total) by mouth 2 (two) times daily.  60 capsule  11  . solifenacin (VESICARE) 10 MG tablet Take 5 mg by mouth daily.         No facility-administered medications prior to visit.    Review of Systems:  As above. No dyspnea on exertion. No shortness of breath. Abdominal pain. No bloody stools. No melena. No vomiting. Decreased appetite. Urinating normally.  Physical Examination: BP 110/72  Pulse 75  Temp(Src) 98.5 F (36.9 C) (Oral)  Ht 5\' 4"  (1.626 m)  Wt 139 lb 8 oz  (63.277 kg)  BMI 23.93 kg/m2  SpO2 97%  Ideal Body Weight: Weight in (lb) to have BMI = 25: 145.3  GEN: WDWN, NAD, Non-toxic, A & O x 3 HEENT: Atraumatic, Normocephalic. Neck supple. No masses, No LAD. Ears and Nose: No external deformity. CV: RRR, No M/G/R. No JVD. No thrill. No extra heart sounds. PULM: CTA B, no wheezes, crackles, rhonchi. No retractions. No resp. distress. No accessory muscle use. ABD: S, mild nonfocal tenderness, more in the lower quadrants, ND, +BS. No rebound. No HSM. EXTR: No c/c/e NEURO Normal gait.  PSYCH: Normally interactive. Conversant. Not depressed or anxious appearing.  Calm demeanor.    Assessment and Plan:  Acute abdominal pain - Plan: Basic metabolic panel, CBC with Differential, Hepatic function panel, Lipase  Irritable bowel syndrome  ANEMIA, IRON DEFICIENCY  History is most suggestive of durable bowel syndrome. Given location, it could be at least a component of interstitial cystitis, however, I do not think that explains the entirety of her symptoms. Certainly quite anxious and that could be playing a role. She is not doing much of a bowel regimen, so we reviewed basic IBS care including fluid, fiber, and fiber supplements. We are also going to give her some Bentyl to try right now.  Check basic laboratories. As these have come back, she actually has a hemoglobin of 10, which might explain her fatigue, some to double check iron panels  Orders Today:  Orders Placed This Encounter  Procedures  . Basic metabolic panel  . CBC with Differential  . Hepatic function panel  . Lipase    Updated Medication List: (Includes new medications, updates to list, dose adjustments) Meds ordered this encounter  Medications  . dicyclomine (BENTYL) 20 MG tablet    Sig: Take 1 tablet (20 mg total) by mouth every 6 (six) hours.    Dispense:  60 tablet    Refill:  2    Medications Discontinued: There are no discontinued medications.    Signed, Elpidio Galea. Chrisean Kloth, MD 08/20/2012 12:36 PM

## 2012-08-20 NOTE — Patient Instructions (Signed)
Irritable Bowel Syndrome  Irritable Bowel Syndrome (IBS) is caused by a disturbance of normal bowel function. Other terms used are spastic colon, mucous colitis, and irritable colon. It does not require surgery, nor does it lead to cancer. There is no cure for IBS. But with proper diet, stress reduction, and medication, you will find that your problems (symptoms) will gradually disappear or improve. IBS is a common digestive disorder. It usually appears in late adolescence or early adulthood. Women develop it twice as often as men.  CAUSES   After food has been digested and absorbed in the small intestine, waste material is moved into the colon (large intestine). In the colon, water and salts are absorbed from the undigested products coming from the small intestine. The remaining residue, or fecal material, is held for elimination. Under normal circumstances, gentle, rhythmic contractions on the bowel walls push the fecal material along the colon towards the rectum. In IBS, however, these contractions are irregular and poorly coordinated. The fecal material is either retained too long, resulting in constipation, or expelled too soon, producing diarrhea.  SYMPTOMS   The most common symptom of IBS is pain. It is typically in the lower left side of the belly (abdomen). But it may occur anywhere in the abdomen. It can be felt as heartburn, backache, or even as a dull pain in the arms or shoulders. The pain comes from excessive bowel-muscle spasms and from the buildup of gas and fecal material in the colon. This pain:   Can range from sharp belly (abdominal) cramps to a dull, continuous ache.   Usually worsens soon after eating.   Is typically relieved by having a bowel movement or passing gas.  Abdominal pain is usually accompanied by constipation. But it may also produce diarrhea. The diarrhea typically occurs right after a meal or upon arising in the morning. The stools are typically soft and watery. They are often  flecked with secretions (mucus).  Other symptoms of IBS include:   Bloating.   Loss of appetite.   Heartburn.   Feeling sick to your stomach (nausea).   Belching   Vomiting   Gas.  IBS may also cause a number of symptoms that are unrelated to the digestive system:   Fatigue.   Headaches.   Anxiety   Shortness of breath   Difficulty in concentrating.   Dizziness.  These symptoms tend to come and go.  DIAGNOSIS   The symptoms of IBS closely mimic the symptoms of other, more serious digestive disorders. So your caregiver may wish to perform a variety of additional tests to exclude these disorders. He/she wants to be certain of learning what is wrong (diagnosis). The nature and purpose of each test will be explained to you.  TREATMENT  A number of medications are available to help correct bowel function and/or relieve bowel spasms and abdominal pain. Among the drugs available are:   Mild, non-irritating laxatives for severe constipation and to help restore normal bowel habits.   Specific anti-diarrheal medications to treat severe or prolonged diarrhea.   Anti-spasmodic agents to relieve intestinal cramps.   Your caregiver may also decide to treat you with a mild tranquilizer or sedative during unusually stressful periods in your life.  The important thing to remember is that if any drug is prescribed for you, make sure that you take it exactly as directed. Make sure that your caregiver knows how well it worked for you.  HOME CARE INSTRUCTIONS    Avoid foods that   are high in fat or oils. Some examples are:heavy cream, butter, frankfurters, sausage, and other fatty meats.   Avoid foods that have a laxative effect, such as fruit, fruit juice, and dairy products.   Cut out carbonated drinks, chewing gum, and "gassy" foods, such as beans and cabbage. This may help relieve bloating and belching.   Bran taken with plenty of liquids may help relieve constipation.   Keep track of what foods seem to trigger  your symptoms.   Avoid emotionally charged situations or circumstances that produce anxiety.   Start or continue exercising.   Get plenty of rest and sleep.  MAKE SURE YOU:    Understand these instructions.   Will watch your condition.   Will get help right away if you are not doing well or get worse.  Document Released: 06/17/2005 Document Revised: 09/09/2011 Document Reviewed: 02/05/2008  [employer] Patient Information 2013 [employer].

## 2012-08-26 ENCOUNTER — Encounter: Payer: Self-pay | Admitting: Family Medicine

## 2012-09-02 ENCOUNTER — Ambulatory Visit (INDEPENDENT_AMBULATORY_CARE_PROVIDER_SITE_OTHER): Payer: 59 | Admitting: Psychology

## 2012-09-02 DIAGNOSIS — F4323 Adjustment disorder with mixed anxiety and depressed mood: Secondary | ICD-10-CM

## 2012-09-03 ENCOUNTER — Other Ambulatory Visit: Payer: Self-pay | Admitting: Family Medicine

## 2012-09-04 ENCOUNTER — Other Ambulatory Visit: Payer: Self-pay

## 2012-09-05 NOTE — Telephone Encounter (Signed)
Rx called in as directed.   

## 2012-09-08 ENCOUNTER — Telehealth: Payer: Self-pay | Admitting: Family Medicine

## 2012-09-08 DIAGNOSIS — M810 Age-related osteoporosis without current pathological fracture: Secondary | ICD-10-CM

## 2012-09-08 DIAGNOSIS — E7800 Pure hypercholesterolemia, unspecified: Secondary | ICD-10-CM

## 2012-09-08 NOTE — Telephone Encounter (Signed)
Message copied by Excell Seltzer on Tue Sep 08, 2012  5:27 PM ------      Message from: Alvina Chou      Created: Tue Sep 08, 2012 11:19 AM      Regarding: Lab orders for Wednesday,  March 12.14       Patient is scheduled for CPX labs, please order future labs, Thanks , Terri       ------

## 2012-09-09 ENCOUNTER — Other Ambulatory Visit (INDEPENDENT_AMBULATORY_CARE_PROVIDER_SITE_OTHER): Payer: Medicare Other

## 2012-09-09 DIAGNOSIS — E7800 Pure hypercholesterolemia, unspecified: Secondary | ICD-10-CM

## 2012-09-09 DIAGNOSIS — M810 Age-related osteoporosis without current pathological fracture: Secondary | ICD-10-CM

## 2012-09-09 LAB — COMPREHENSIVE METABOLIC PANEL
ALT: 15 U/L (ref 0–35)
BUN: 18 mg/dL (ref 6–23)
CO2: 30 mEq/L (ref 19–32)
Calcium: 9.4 mg/dL (ref 8.4–10.5)
Chloride: 106 mEq/L (ref 96–112)
Creatinine, Ser: 0.9 mg/dL (ref 0.4–1.2)
GFR: 77.34 mL/min (ref >60.00)

## 2012-09-09 LAB — LIPID PANEL
HDL: 48.4 mg/dL (ref >39.00)
Triglycerides: 106 mg/dL (ref 0.0–149.0)

## 2012-09-10 LAB — VITAMIN D 25 HYDROXY (VIT D DEFICIENCY, FRACTURES): Vit D, 25-Hydroxy: 10 ng/mL — ABNORMAL LOW (ref 30–89)

## 2012-09-11 ENCOUNTER — Encounter: Payer: Self-pay | Admitting: Family Medicine

## 2012-09-11 ENCOUNTER — Ambulatory Visit (INDEPENDENT_AMBULATORY_CARE_PROVIDER_SITE_OTHER): Payer: Medicare Other | Admitting: Family Medicine

## 2012-09-11 VITALS — BP 120/74 | HR 71 | Temp 98.3°F | Ht 64.0 in | Wt 141.2 lb

## 2012-09-11 DIAGNOSIS — I10 Essential (primary) hypertension: Secondary | ICD-10-CM

## 2012-09-11 DIAGNOSIS — F411 Generalized anxiety disorder: Secondary | ICD-10-CM

## 2012-09-11 DIAGNOSIS — H9193 Unspecified hearing loss, bilateral: Secondary | ICD-10-CM

## 2012-09-11 DIAGNOSIS — Z0000 Encounter for general adult medical examination without abnormal findings: Secondary | ICD-10-CM

## 2012-09-11 DIAGNOSIS — F419 Anxiety disorder, unspecified: Secondary | ICD-10-CM

## 2012-09-11 DIAGNOSIS — H9190 Unspecified hearing loss, unspecified ear: Secondary | ICD-10-CM

## 2012-09-11 DIAGNOSIS — E7800 Pure hypercholesterolemia, unspecified: Secondary | ICD-10-CM

## 2012-09-11 DIAGNOSIS — H9319 Tinnitus, unspecified ear: Secondary | ICD-10-CM

## 2012-09-11 DIAGNOSIS — M810 Age-related osteoporosis without current pathological fracture: Secondary | ICD-10-CM

## 2012-09-11 DIAGNOSIS — H9313 Tinnitus, bilateral: Secondary | ICD-10-CM

## 2012-09-11 DIAGNOSIS — F321 Major depressive disorder, single episode, moderate: Secondary | ICD-10-CM

## 2012-09-11 DIAGNOSIS — D509 Iron deficiency anemia, unspecified: Secondary | ICD-10-CM

## 2012-09-11 DIAGNOSIS — IMO0001 Reserved for inherently not codable concepts without codable children: Secondary | ICD-10-CM

## 2012-09-11 MED ORDER — ERGOCALCIFEROL 1.25 MG (50000 UT) PO CAPS: 50000.0000 [IU] | ORAL_CAPSULE | ORAL | Status: DC

## 2012-09-11 NOTE — Assessment & Plan Note (Signed)
Poor control, likely contributing to her IBS.

## 2012-09-11 NOTE — Assessment & Plan Note (Signed)
Due for bone density. Has been on Boniva for 8 years off and on.. Likely should stop this med.

## 2012-09-11 NOTE — Assessment & Plan Note (Signed)
Well controlled. Continue current medication. Zetia

## 2012-09-11 NOTE — Assessment & Plan Note (Signed)
Well controlled. Continue current medication.  

## 2012-09-11 NOTE — Assessment & Plan Note (Addendum)
?   Due to antibiotics in past.  No vascular quality.  No wax, no fluid behind ear drums.  Pt not interested in ENT referral. i doubt there is much they can do anyway.  She notes her mother had tinnitus as she aged.

## 2012-09-11 NOTE — Patient Instructions (Addendum)
Work on increasing fiber in diet, increase water. Avoid greasy food, work on stress reduction. Start ferrous sulfate daily. Work on [employer], sweets etc. Stop at front desk to set up bone density. Look into shingles vaccine coverage. Take vit D to get it back up x 12 weeks, then return to OTC vit d 400 IU twice daily Look into savella for treatment of fibromyalgia and mood. Let me know if you are interested in pshychiatrist.

## 2012-09-11 NOTE — Assessment & Plan Note (Signed)
Previously noted, but on re-screen today nml.

## 2012-09-11 NOTE — Assessment & Plan Note (Signed)
Stable control. 

## 2012-09-11 NOTE — Assessment & Plan Note (Addendum)
Inadequate control. Offered trial of new med such as savella vs referral to psychiatrist. Continue seeing counselor regularly.  She uses a lot of alprazolam daily.  Replacing vit D may help this issue.

## 2012-09-11 NOTE — Assessment & Plan Note (Signed)
Start iron.

## 2012-09-11 NOTE — Progress Notes (Signed)
I have personally reviewed the Medicare Annual Wellness questionnaire and have noted  1. The patient's medical and social history  2. Their use of alcohol, tobacco or illicit drugs  3. Their current medications and supplements  4. The patient's functional ability including ADL's, fall risks, home safety risks and hearing or visual  impairment.  5. Diet and physical activities  6. Evidence for depression or mood disorders  The patients weight, height, BMI and visual acuity have been recorded in the chart  I have made referrals, counseling and provided education to the patient based review of the above and I have provided the pt with a written personalized care plan for preventive services.   Chronic interstitial cystitis, chronic UTI... Followed by  Dr. Jamelle Rushing  Fairly stable with vesicare, renal US was nml. She does have issues with sex.  Fibromyalgia: Moderate control on lyrica and skelaxin. Vitamin supplements.   Hypertension: Well controlled on losartan/HCTZ.  Using medication without problems or lightheadedness:  None Chest pain with exertion:None  Edema:None  Short of breath:None  Average home BPs: not checking Other issues:   Elevated Cholesterol: LDL at goal <130 on zetia but not taking regularly  Lab Results  Component Value Date   CHOL 185 09/09/2012   HDL 48.40 09/09/2012   LDLCALC 115* 09/09/2012   LDLDIRECT 126.4 06/21/2011   TRIG 106.0 09/09/2012   CHOLHDL 4 09/09/2012  Using medications without problems: None  Muscle aches: None  Diet compliance:Moderate  Exercise: Limited. Other complaints:   Prediabetes:  Sister with DM. Eating a lot of carbs lately.  Recent issues with IBS.Marland Kitchen Has seen previois PCP and Dr. Patsy Lager in last few months. Labs were nml except iron def anemia. Given bentyl on 08/20/12. Levsin did not help. She has had improvement with bentyl.  Iron def anemia Hg 10.. Never started ferrous sulfate daily.  Anxiety/Depression: poor control, on  alprazolam -1 to 2 during the day and 1/2-1 at bedtime.  Has tried wellbutrin, zoloft, cymbalta, effexor in past with SE.  Seeing Dr. Laymond Purser currently.  Ringing in ears chronically:  X 1-2 year, sounds like crickets, no pulse sound, some hearing loss.  Previous note from 04/2011 notes that she had noted some of this start or at least worsen while on cipro. If note she was on antibiotics (possibly cipro at last few weeks)   Review of Systems  Constitutional: Negative for fever and fatigue.  HENT: Negative for ear pain.  Eyes: Negative for pain.  Respiratory: Negative for chest tightness and shortness of breath.  Cardiovascular: Negative for chest pain, palpitations and leg swelling.  Gastrointestinal: Negative for abdominal pain.  Genitourinary: Negative for dysuria.  Objective:   Physical Exam  Constitutional: Vital signs are normal. She appears well-developed and well-nourished. She is cooperative. Non-toxic appearance. She does not appear ill. No distress.  HENT:  Head: Normocephalic.  Right Ear: Hearing, tympanic membrane, external ear and ear canal normal.  Left Ear: Hearing, tympanic membrane, external ear and ear canal normal.  Nose: Nose normal.  Eyes: Conjunctivae, EOM and lids are normal. Pupils are equal, round, and reactive to light. No foreign bodies found.  Neck: Trachea normal and normal range of motion. Neck supple. Carotid bruit is not present. No mass and no thyromegaly present.  Cardiovascular: Normal rate, regular rhythm, S1 normal, S2 normal, normal heart sounds and intact distal pulses. Exam reveals no gallop.  No murmur heard.  Pulmonary/Chest: Effort normal and breath sounds normal. No respiratory distress. She has no wheezes.  She has no rhonchi. She has no rales.  Abdominal: Soft. Normal appearance and bowel sounds are normal. She exhibits no distension, no fluid wave, no abdominal bruit and no mass. There is no hepatosplenomegaly. There is no tenderness. There is  no rebound, no guarding and no CVA tenderness. No hernia.  Genitourinary:  DVE/breast exam per GYN  Lymphadenopathy:  She has no cervical adenopathy.  She has no axillary adenopathy.  Neurological: She is alert. She has normal strength and normal reflexes. No cranial nerve deficit or sensory deficit. She displays a negative Romberg sign.  No low back ttp focally, but diffuse body pain at trigger points.  Skin: Skin is warm, dry and intact. No rash noted.  Psychiatric: Her speech is normal and behavior is normal. Judgment normal. Her mood appears not anxious. Cognition and memory are normal. She does not exhibit a depressed mood.  Assessment & Plan:   AMW: The patient's preventative maintenance and recommended screening tests for an annual wellness exam were reviewed in full today.  Brought up to date unless services declined.  Counselled on the importance of diet, exercise, and its role in overall health and mortality.  The patient's FH and SH was reviewed, including their home life, tobacco status, and drug and alcohol status.   Vaccines: Due for Td 2013 (refused due to SE in past), flu given last fall. Consider shingles vaccine.  Mammo: Last nml 3.2013 Due. Breast exam Per GYN.  Sister breast cancer... Ms. Warmoth neg for BCa gene... But plan MRI breast every 6 months to 1 year. DVE/PAP: Per GYN, 08/2012 Colon: Last in 2007, nml... Due in 10 years.  DEXA:  On boniva for  8 years  for osteoporosis.  Never had DEXA last year.

## 2012-09-15 ENCOUNTER — Ambulatory Visit: Payer: Self-pay | Admitting: Family Medicine

## 2012-09-15 ENCOUNTER — Encounter: Payer: Self-pay | Admitting: Family Medicine

## 2012-09-17 ENCOUNTER — Encounter: Payer: Self-pay | Admitting: *Deleted

## 2012-09-23 ENCOUNTER — Ambulatory Visit: Payer: Medicare Other | Admitting: Psychology

## 2012-10-11 ENCOUNTER — Other Ambulatory Visit: Payer: Self-pay | Admitting: Family Medicine

## 2012-10-24 ENCOUNTER — Other Ambulatory Visit: Payer: Self-pay | Admitting: Family Medicine

## 2012-11-02 ENCOUNTER — Other Ambulatory Visit: Payer: Self-pay | Admitting: Family Medicine

## 2012-11-23 ENCOUNTER — Other Ambulatory Visit: Payer: Self-pay | Admitting: Family Medicine

## 2012-11-24 NOTE — Telephone Encounter (Signed)
rx called to pharmacy 

## 2012-11-26 ENCOUNTER — Other Ambulatory Visit: Payer: Self-pay | Admitting: Family Medicine

## 2012-11-28 ENCOUNTER — Other Ambulatory Visit: Payer: Self-pay | Admitting: Family Medicine

## 2012-12-07 ENCOUNTER — Other Ambulatory Visit: Payer: Self-pay | Admitting: Family Medicine

## 2012-12-23 ENCOUNTER — Other Ambulatory Visit: Payer: Self-pay | Admitting: Family Medicine

## 2013-01-14 ENCOUNTER — Other Ambulatory Visit: Payer: Self-pay | Admitting: Family Medicine

## 2013-01-14 NOTE — Telephone Encounter (Signed)
rx called to pharmacy 

## 2013-02-15 ENCOUNTER — Telehealth: Payer: Self-pay | Admitting: Family Medicine

## 2013-02-15 ENCOUNTER — Ambulatory Visit (INDEPENDENT_AMBULATORY_CARE_PROVIDER_SITE_OTHER): Payer: Medicare Other | Admitting: Family Medicine

## 2013-02-15 ENCOUNTER — Encounter: Payer: Self-pay | Admitting: Family Medicine

## 2013-02-15 ENCOUNTER — Telehealth: Payer: Self-pay

## 2013-02-15 VITALS — BP 122/70 | HR 84 | Temp 98.0°F | Wt 148.2 lb

## 2013-02-15 DIAGNOSIS — R55 Syncope and collapse: Secondary | ICD-10-CM | POA: Insufficient documentation

## 2013-02-15 DIAGNOSIS — M25569 Pain in unspecified knee: Secondary | ICD-10-CM | POA: Insufficient documentation

## 2013-02-15 DIAGNOSIS — M25561 Pain in right knee: Secondary | ICD-10-CM

## 2013-02-15 NOTE — Assessment & Plan Note (Signed)
Would use a knee sleeve and tylenol prn.  No sign of sig internal derangement.

## 2013-02-15 NOTE — Progress Notes (Signed)
Patient states she wasn't sure if she was to continue the Vit D after the first Rx or not.  She also is not sure if she is to continue the [place].  Ninetta Lights, CMA  The above concern was routed to the PCP.   Last night her R knee got more puffy and swollen, tender. She took her regular PM meds and an advil. She had diffuse pain on the R knee and shin.   Was advised by family to come to office today for eval.   Per patient's husband's report, she was on the ground yesterday.  She has no recollection of going to ground.  She remembers being picked up after the event.  It is possible the knee pain started before the possible syncope, but this isn't clear.  No known tongue biting, SZ like activity, bowel or bladder incontinence.    She has fibromyalgia and has aches at baseline.  She has a h/o syncope in the past, but it was at a funeral and likely hyperventilated per patient report.  No known CP, SOB, BLE edema.    Meds, vitals, and allergies reviewed.   ROS: See HPI.  Otherwise, noncontributory.  GEN: nad, alert and oriented HEENT: mucous membranes moist NECK: supple w/o LA CV: rrr PULM: ctab, no inc wob ABD: soft, +bs EXT: no edema SKIN: no acute rash R knee puffy but not bruised, ACL MCL LCL feel solid on testing, no crepitus on ROM

## 2013-02-15 NOTE — Patient Instructions (Addendum)
Go to the lab on the way out.  We'll contact you with your lab report. See Shirlee Limerick about your referral before you leave today. Don't drive until this is resolved.  Take tylenol for the pain and use a knee sleeve in the meantime.  Take care.

## 2013-02-15 NOTE — Telephone Encounter (Signed)
will see today. 

## 2013-02-15 NOTE — Assessment & Plan Note (Signed)
Presumed, no driving, refer to cards.  Check basic labs today, EKG today okay. D/w pt.  She agrees.

## 2013-02-15 NOTE — Telephone Encounter (Signed)
Patient Information:  Caller Name: Shannon Hancock  Phone: [phone]  Patient: Shannon Hancock, Shannon Hancock  Gender: Female  DOB: 12-22-1950  Age: 62 Years  PCP: Kerby Nora (Family Practice)  Office Follow Up:  Does the office need to follow up with this patient?: No  Instructions For The Office: N/A  RN Note:  Onset 02/14/2013 woke up with NEW,right knee pain and swollen about twice the size of the left size, presently decreased to about 1 1/2 times the size of the left side. Was off of Vesicare for 2-3 weeks and restarted on Saturday, 02/13/2013 and then Knee pain/swelling the next day, 02/14/2013. She does have increased pain with Dorsi Flex position and pointing position. She is complaining of pain "7"/10, last night was 10/10. Generalyzed achiness in body r/t Fibromyalgia. She did VSS: 119/80, heart rate 87  respirations easy and unlabored. Triaged in [facility]: All emergent signs and symptoms of Edema, Atraumatic protocol ruled out except 'New or worsening one-side leg swelling with pain that may be described as achy: pain may worsen with standing or walking'. RN/CAN transferred to office triage nurse, "[name]" who scheduled and confirmed with [facility] appointment for today, 02/15/2013 @ 15:45. RN/CAN alerted [place] to [facility] Precautions and her husband will drive.  Symptoms  Reason For Call & Symptoms: Right Knee swollen @ 2X normal size, shortness of breath and tired.  Reviewed Health History In EMR: Yes  Reviewed Medications In EMR: Yes  Reviewed Allergies In EMR: Yes  Reviewed Surgeries / Procedures: Yes  Date of Onset of Symptoms: 02/14/2013  Treatments Tried: Took medications as prescribed for Fibromyalsia and Advil.  Treatments Tried Worked: No  Guideline(s) Used:  Leg Swelling and Edema  Disposition Per Guideline:   See Today in Office  Reason For Disposition Reached:   Patient wants to be seen  Advice Given:  N/A  Patient Will Follow Care Advice:  YES

## 2013-02-15 NOTE — Telephone Encounter (Signed)
Delaney Meigs with CAN said pt woke up  02/14/13 with new rt knee pain and swelling; knee is swollen 1 1/2 times size of lt knee. Pt off vesicare 2-3 weeks and restarted on 02/13/13. No known injury. No numbness or discoloration, no red streaks coming from knee and Knee does not feel warm or cool to touch.  Ibuprofen does not help pain; last night pain level 10 and today 7. Pt scheduled appt with Dr Para March today at 3:45 pm.

## 2013-02-16 ENCOUNTER — Telehealth: Payer: Self-pay | Admitting: Family Medicine

## 2013-02-16 ENCOUNTER — Ambulatory Visit (INDEPENDENT_AMBULATORY_CARE_PROVIDER_SITE_OTHER)
Admission: RE | Admit: 2013-02-16 | Discharge: 2013-02-16 | Disposition: A | Discharge status: Home / Self Care | Payer: Medicare Other | Source: Ambulatory Visit | Attending: Family Medicine | Admitting: Family Medicine

## 2013-02-16 ENCOUNTER — Ambulatory Visit (INDEPENDENT_AMBULATORY_CARE_PROVIDER_SITE_OTHER): Payer: Medicare Other | Admitting: Family Medicine

## 2013-02-16 ENCOUNTER — Encounter: Payer: Self-pay | Admitting: Family Medicine

## 2013-02-16 VITALS — BP 132/74 | HR 80 | Temp 98.7°F | Ht 64.0 in | Wt 150.2 lb

## 2013-02-16 DIAGNOSIS — M25561 Pain in right knee: Secondary | ICD-10-CM

## 2013-02-16 DIAGNOSIS — M25569 Pain in unspecified knee: Secondary | ICD-10-CM

## 2013-02-16 LAB — COMPREHENSIVE METABOLIC PANEL
AST: 24 U/L (ref 0–37)
Alkaline Phosphatase: 50 U/L (ref 39–117)
Glucose, Bld: 81 mg/dL (ref 70–99)
Potassium: 3.6 mEq/L (ref 3.5–5.1)
Sodium: 140 mEq/L (ref 135–145)
Total Bilirubin: 0.6 mg/dL (ref 0.3–1.2)
Total Protein: 7.2 g/dL (ref 6.0–8.3)

## 2013-02-16 LAB — CBC WITH DIFFERENTIAL/PLATELET
Basophils Relative: 0.1 % (ref 0.0–3.0)
Eosinophils Absolute: 0.1 10*3/uL (ref 0.0–0.7)
Eosinophils Relative: 2.2 % (ref 0.0–5.0)
HCT: 33.8 % — ABNORMAL LOW (ref 36.0–46.0)
Lymphs Abs: 1.9 10*3/uL (ref 0.7–4.0)
MCHC: 33.1 g/dL (ref 30.0–36.0)
MCV: 81.3 fl (ref 78.0–100.0)
Monocytes Absolute: 0.4 10*3/uL (ref 0.1–1.0)
Platelets: 205 10*3/uL (ref 150.0–400.0)
RBC: 4.15 Mil/uL (ref 3.87–5.11)
WBC: 6.7 10*3/uL (ref 4.5–10.5)

## 2013-02-16 NOTE — Patient Instructions (Addendum)
Use your knee brace Elevate and ice knee (10 minutes at a time) whenever you can  Use aleve 1-2 pills with food up to twice daily with food - for 3-4 days Update [place] if no improvement Xray today and we will call you with a result  Since there was confusion about your recent fall and you did not actually faint - driving is ok

## 2013-02-16 NOTE — Telephone Encounter (Signed)
Patient advised.   Meds removed from medication list.

## 2013-02-16 NOTE — Addendum Note (Signed)
Addended by: Annamarie Major on: 02/16/2013 12:01 PM   Modules accepted: Orders

## 2013-02-16 NOTE — Assessment & Plan Note (Signed)
Pt entirely denies LOC today- she clarifies that she tripped and fell and there was no loc at all  She says her husband was mistaken at their last visit

## 2013-02-16 NOTE — Telephone Encounter (Signed)
Message copied by Excell Seltzer on Tue Feb 16, 2013  8:39 AM ------      Message from: Joaquim Nam      Created: Mon Feb 15, 2013  4:40 PM       Pt seen today.  Patient states she wasn't sure if she was to continue the Vit D after the first Rx or not.  She also is not sure if she is to continue the [place]. I'll defer to you.  The note isn't done yet.  Syncope- will send to cards.  Thanks.              Clelia Croft       ------

## 2013-02-16 NOTE — Assessment & Plan Note (Signed)
Suspect strain /contusion from fall  Xray today in light of mild patellar tenderness Reassuring exam  Recommend ice/ elevation and knee brace when ambulating Update if not starting to improve in a week or if worsening

## 2013-02-16 NOTE — Progress Notes (Signed)
Subjective:    Patient ID: Shannon Hancock, female    DOB: 11/09/1950, 62 y.o.   MRN: [id number]  HPI Here for R knee pain - started Sunday night (is very swollen ) Had a dinner at her house that day -- and then slipped and fell  She has fibromyalgia and feet were really aching that day - that caused her to fall  Husband thought she collapsed but she is adamant that she slipped and fell -- landed on her knee - yesterday- her knee may have twisted a bit   She has trouble with that knee baseline -anyway -- she injured it 4 mo ago / fell on it and thinks she landed on a hard object  She was also working on hands and knees last weekend before the dinner    There was confusion re: yesterday's visit re: whether she fell or collapsed   She had taken skelaxin and lyrica and aleve that day   She has been wearing brace on knee - with patellar stabilizing hole  Takes aleve periodically  Knee feels a little better today  Swelling is improved   Patient Active Problem List   Diagnosis Date Noted  . Knee pain 02/15/2013  . Syncope 02/15/2013  . Irritable bowel syndrome 08/20/2012  . Hearing loss 11/07/2011  . Anxiety 11/07/2011  . Peripheral neuropathy 07/05/2011  . Osteoporosis 07/05/2011  . Tinnitus, bilateral 04/05/2011  . HYPERCHOLESTEROLEMIA 04/05/2010  . ANEMIA, IRON DEFICIENCY 04/03/2010  . DEPRESSION, MAJOR, MODERATE 04/03/2010  . HYPERTENSION 04/03/2010  . ALLERGIC RHINITIS 04/03/2010  . 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 POSTMENOPAUSAL SYNDROME 04/03/2010  . 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 FIBROMYALGIA 04/03/2010   Past Medical History  Diagnosis Date  . Allergy   . Hypertension   . Fibromyalgia    Past Surgical History  Procedure Laterality Date  . Tubal ligation    . Foot surgery     History  Substance Use Topics  . Smoking status: Former Games developer  . Smokeless tobacco: Never Used  . Alcohol Use: No   Family  History  Problem Relation Age of Onset  . Alzheimer's disease Mother   . Coronary artery disease Father   . Hypertension Father   . Depression Father   . Cancer Father     prostate  . Cancer Sister     breast  . Cancer Brother     prostate  . Hypertension Brother   . Hyperlipidemia Brother    Allergies  Allergen Reactions  . Aspirin     Make stomach upset   Current Outpatient Prescriptions on File Prior to Visit  Medication Sig Dispense Refill  . ALPRAZolam (XANAX) 1 MG tablet TAKE 1 TABLET BY MOUTH 4 TIMES A DAY AS NEEDED FOR SLEEP OR ANXIETY  120 tablet  0  . dicyclomine (BENTYL) 20 MG tablet TAKE 1 TABLET (20 MG TOTAL) BY MOUTH EVERY 6 (SIX) HOURS.  60 tablet  2  . losartan-hydrochlorothiazide (HYZAAR) 100-12.5 MG per tablet TAKE 1 TABLET BY MOUTH EVERY DAY  30 tablet  5  . LYRICA 100 MG capsule TAKE ONE CAPSULE TWICE A DAY  60 capsule  3  . metaxalone (SKELAXIN) 800 MG tablet TAKE 1 TABLET TWICE A DAY  60 tablet  0  . omeprazole (PRILOSEC) 20 MG capsule Take 20 mg by mouth daily.      . VESICARE 10 MG tablet TAKE 1 TABLET BY MOUTH EVERY DAY  28 tablet  9  . ZETIA 10 MG tablet TAKE 1 TABLET (10 MG TOTAL) BY MOUTH DAILY.  30 tablet  3  . [DISCONTINUED] solifenacin (VESICARE) 10 MG tablet Take 5 mg by mouth daily.         No current facility-administered medications on file prior to visit.      Review of Systems Review of Systems  Constitutional: Negative for fever, appetite change, fatigue and unexpected weight change.  Eyes: Negative for pain and visual disturbance.  Respiratory: Negative for cough and shortness of breath.   Cardiovascular: Negative for cp or palpitations    Gastrointestinal: Negative for nausea, diarrhea and constipation.  Genitourinary: Negative for urgency and frequency.  Skin: Negative for pallor or rash   MSK pos for knee pain and swelling , pos for chronic myofasical pain  Neurological: Negative for weakness, light-headedness, numbness and  headaches.  Hematological: Negative for adenopathy. Does not bruise/bleed easily.  Psychiatric/Behavioral: Negative for dysphoric mood. The patient is not nervous/anxious.         Objective:   Physical Exam  Constitutional: She appears well-developed and well-nourished. No distress.  HENT:  Head: Normocephalic and atraumatic.  Eyes: Conjunctivae and EOM are normal. Pupils are equal, round, and reactive to light.  Neck: Normal range of motion. Neck supple.  Cardiovascular: Normal rate and regular rhythm.   Musculoskeletal:       Right knee: She exhibits decreased range of motion, swelling and bony tenderness. She exhibits no effusion, no ecchymosis, no deformity, no laceration, no erythema, normal alignment, no LCL laxity, normal patellar mobility and no MCL laxity. Tenderness found. Medial joint line tenderness noted.  R knee Mild tenderness in upper 1/2 of patella Some soft tissue swelling but no effusion noted rom is almost full - pain on full flexion only  Nl bounce test  Neg mcmurray test Nl weight bearing   Nl drawer/lachman- overall stable   Neurological: She is alert. She has normal strength and normal reflexes. No sensory deficit. She exhibits normal muscle tone.  Skin: Skin is warm and dry. No erythema.  Psychiatric: She has a normal mood and affect.          Assessment & Plan:

## 2013-02-16 NOTE — Telephone Encounter (Signed)
Shannon Hancock: Let pt know. She does not need to continue vit D.  Have her stop boniv... Remove from list.  Will recheck bone density in 2 years.   Thanks Clelia Croft!

## 2013-02-19 ENCOUNTER — Ambulatory Visit: Payer: Self-pay | Admitting: Family Medicine

## 2013-02-23 ENCOUNTER — Encounter: Payer: Self-pay | Admitting: *Deleted

## 2013-02-23 ENCOUNTER — Ambulatory Visit: Payer: Self-pay | Admitting: Cardiovascular Disease

## 2013-02-26 ENCOUNTER — Other Ambulatory Visit: Payer: Self-pay | Admitting: Family Medicine

## 2013-02-26 ENCOUNTER — Encounter: Payer: Self-pay | Admitting: Family Medicine

## 2013-02-26 NOTE — Telephone Encounter (Signed)
She is using more than previously per my records. I need an appt to discuss further before refill. .. Can add her on next week.

## 2013-02-26 NOTE — Telephone Encounter (Signed)
Appointment scheduled for 03/02/2013 @ 2:45pm to discuss Xanax refill.  Ileana Ladd

## 2013-02-26 NOTE — Telephone Encounter (Signed)
Last office visit 02/16/2013-Acute visit with Dr. Lucretia Roers for knee pain.  Ok to refill?

## 2013-03-02 ENCOUNTER — Ambulatory Visit: Payer: Self-pay | Admitting: Family Medicine

## 2013-03-02 ENCOUNTER — Other Ambulatory Visit: Payer: Self-pay | Admitting: *Deleted

## 2013-03-02 ENCOUNTER — Ambulatory Visit (INDEPENDENT_AMBULATORY_CARE_PROVIDER_SITE_OTHER): Payer: Medicare Other | Admitting: Family Medicine

## 2013-03-02 ENCOUNTER — Encounter: Payer: Self-pay | Admitting: Family Medicine

## 2013-03-02 VITALS — BP 128/70 | HR 78 | Temp 98.3°F | Ht 64.0 in | Wt 151.2 lb

## 2013-03-02 DIAGNOSIS — H9319 Tinnitus, unspecified ear: Secondary | ICD-10-CM

## 2013-03-02 DIAGNOSIS — F419 Anxiety disorder, unspecified: Secondary | ICD-10-CM

## 2013-03-02 DIAGNOSIS — F411 Generalized anxiety disorder: Secondary | ICD-10-CM

## 2013-03-02 DIAGNOSIS — H9313 Tinnitus, bilateral: Secondary | ICD-10-CM

## 2013-03-02 MED ORDER — ALPRAZOLAM 1 MG PO TABS: 1.0000 mg | ORAL_TABLET | Freq: Three times a day (TID) | ORAL | Status: DC | PRN

## 2013-03-02 MED ORDER — LOSARTAN POTASSIUM-HCTZ 100-12.5 MG PO TABS: 1.0000 | ORAL_TABLET | Freq: Every day | ORAL | Status: DC

## 2013-03-02 MED ORDER — EZETIMIBE 10 MG PO TABS: 10.0000 mg | ORAL_TABLET | Freq: Every day | ORAL | Status: DC

## 2013-03-02 MED ORDER — ALPRAZOLAM 1 MG PO TABS: ORAL_TABLET | ORAL | Status: DC

## 2013-03-02 MED ORDER — SOLIFENACIN SUCCINATE 10 MG PO TABS: 10.0000 mg | ORAL_TABLET | Freq: Every day | ORAL | Status: DC

## 2013-03-02 NOTE — Patient Instructions (Addendum)
Stop at the front desk to get referral to ENT. Work on decreasing xanax use as able. Try support group for bereavement.

## 2013-03-02 NOTE — Telephone Encounter (Signed)
Received faxed refill request from pharmacy for a 90 day supply of medications. Refills sent to pharmacy electronically.

## 2013-03-02 NOTE — Assessment & Plan Note (Signed)
Total visit time 30 minutes, > 50% spent counseling and cordinating patients care. She will look into supprots groups. She will try to decrease use of xanax. Will change instructions to reflect what she is taking in reality ( less, only 3 times a day).  Refused SSRI. If use escalates.. Will refer to psychiatrist.

## 2013-03-02 NOTE — Assessment & Plan Note (Signed)
Refer to ENT for further eval. No ototoxic meds.

## 2013-03-02 NOTE — Progress Notes (Signed)
  Subjective:    Patient ID: Shannon Hancock, female    DOB: 16-Apr-1951, 62 y.o.   MRN: [id number]  HPI  62 year old female presents for follow up anxiety. We asked her to come is as it appeared she was using more xanax more frequently than previously.  She reports she has a lot of stress in her life.  She is using 2-3 a day. She states she has not used any more frequently. She has been on it for many years. She may take an additional if she goes to church or a social event.  She reports that in past she has tried wellbutrin zoloft, paxil, prozac, cymbalta, amitryptiline...she always has weight loss with these, poor appetite. She has tried counseling .. hrert and she feels like she gets more anxious with it. It has not helped much.  No SI, no HI.   She is very bothered by ringing (crickets) in both ears for the past year.   Mother had similar. She is interested in ENT referral.    Review of Systems  Constitutional: Negative for fever and fatigue.  HENT: Negative for ear pain.   Eyes: Negative for pain.  Respiratory: Negative for chest tightness and shortness of breath.   Cardiovascular: Negative for chest pain, palpitations and leg swelling.  Gastrointestinal: Negative for abdominal pain.  Genitourinary: Negative for dysuria.       Objective:   Physical Exam  Constitutional: Vital signs are normal. She appears well-developed and well-nourished. She is cooperative.  Non-toxic appearance. She does not appear ill. No distress.  HENT:  Head: Normocephalic.  Right Ear: Hearing, tympanic membrane, external ear and ear canal normal. Tympanic membrane is not erythematous, not retracted and not bulging.  Left Ear: Hearing, tympanic membrane, external ear and ear canal normal. Tympanic membrane is not erythematous, not retracted and not bulging.  Nose: No mucosal edema or rhinorrhea. Right sinus exhibits no maxillary sinus tenderness and no frontal sinus tenderness. Left sinus  exhibits no maxillary sinus tenderness and no frontal sinus tenderness.  Mouth/Throat: Uvula is midline, oropharynx is clear and moist and mucous membranes are normal.  Eyes: Conjunctivae, EOM and lids are normal. Pupils are equal, round, and reactive to light. Lids are everted and swept, no foreign bodies found.  Neck: Trachea normal and normal range of motion. Neck supple. Carotid bruit is not present. No mass and no thyromegaly present.  Cardiovascular: Normal rate, regular rhythm, S1 normal, S2 normal, normal heart sounds, intact distal pulses and normal pulses.  Exam reveals no gallop and no friction rub.   No murmur heard. Pulmonary/Chest: Effort normal and breath sounds normal. Not tachypneic. No respiratory distress. She has no decreased breath sounds. She has no wheezes. She has no rhonchi. She has no rales.  Abdominal: Soft. Normal appearance and bowel sounds are normal. There is no tenderness.  Neurological: She is alert.  Skin: Skin is warm, dry and intact. No rash noted.  Psychiatric: Her speech is normal and behavior is normal. Judgment and thought content normal. Her mood appears not anxious. Cognition and memory are normal. She does not exhibit a depressed mood.          Assessment & Plan:

## 2013-03-05 ENCOUNTER — Other Ambulatory Visit: Payer: Self-pay | Admitting: Family Medicine

## 2013-03-16 ENCOUNTER — Ambulatory Visit (INDEPENDENT_AMBULATORY_CARE_PROVIDER_SITE_OTHER): Payer: Medicare Other | Admitting: Family Medicine

## 2013-03-16 ENCOUNTER — Encounter: Payer: Self-pay | Admitting: Family Medicine

## 2013-03-16 VITALS — BP 100/60 | HR 72 | Temp 97.8°F | Ht 64.0 in | Wt 156.0 lb

## 2013-03-16 DIAGNOSIS — R5381 Other malaise: Secondary | ICD-10-CM

## 2013-03-16 DIAGNOSIS — R5383 Other fatigue: Secondary | ICD-10-CM | POA: Insufficient documentation

## 2013-03-16 DIAGNOSIS — E7800 Pure hypercholesterolemia, unspecified: Secondary | ICD-10-CM

## 2013-03-16 DIAGNOSIS — I10 Essential (primary) hypertension: Secondary | ICD-10-CM

## 2013-03-16 DIAGNOSIS — D509 Iron deficiency anemia, unspecified: Secondary | ICD-10-CM

## 2013-03-16 DIAGNOSIS — Z23 Encounter for immunization: Secondary | ICD-10-CM

## 2013-03-16 LAB — COMPREHENSIVE METABOLIC PANEL
ALT: 16 U/L (ref 0–35)
Alkaline Phosphatase: 51 U/L (ref 39–117)
Sodium: 133 mEq/L — ABNORMAL LOW (ref 135–145)
Total Bilirubin: 0.4 mg/dL (ref 0.3–1.2)
Total Protein: 7.3 g/dL (ref 6.0–8.3)

## 2013-03-16 LAB — CBC WITH DIFFERENTIAL/PLATELET
Eosinophils Absolute: 0.1 10*3/uL (ref 0.0–0.7)
MCHC: 33.1 g/dL (ref 30.0–36.0)
MCV: 80.8 fl (ref 78.0–100.0)
Monocytes Absolute: 0.5 10*3/uL (ref 0.1–1.0)
Neutrophils Relative %: 56.7 % (ref 43.0–77.0)
Platelets: 207 10*3/uL (ref 150.0–400.0)

## 2013-03-16 LAB — LIPID PANEL
HDL: 41.8 mg/dL (ref >39.00)
Triglycerides: 245 mg/dL — ABNORMAL HIGH (ref 0.0–149.0)

## 2013-03-16 LAB — LDL CHOLESTEROL, DIRECT: Direct LDL: 114.5 mg/dL

## 2013-03-16 LAB — TSH: TSH: 0.62 u[IU]/mL (ref 0.35–5.50)

## 2013-03-16 NOTE — Assessment & Plan Note (Signed)
Well controlled. Continue current medication.  

## 2013-03-16 NOTE — Progress Notes (Signed)
  Subjective:    Patient ID: Shannon Hancock, female    DOB: 13-Nov-1950, 62 y.o.   MRN: [id number]  HPI   62 year old female presents for 6 month follow up. She was seen in 9/2 for anxiety. And referred to ENT for tinnitus  Chronic interstitial cystitis, chronic UTI... Followed by Dr. Jamelle Rushing  Fairly stable with vesicare, renal US was nml.  She does have issues with sex.   Fibromyalgia: Moderate control on lyrica and skelaxin. Vitamin supplements.   Hypertension: Well controlled on losartan/HCTZ.  Using medication without problems or lightheadedness: None  Chest pain with exertion:None  Edema:None  Short of breath:None  Average home BPs: not checking  Other issues:   Elevated Cholesterol: Due for re-check. LDL goal <130. Lab Results  Component Value Date   CHOL 185 09/09/2012   HDL 48.40 09/09/2012   LDLCALC 115* 09/09/2012   LDLDIRECT 126.4 06/21/2011   TRIG 106.0 09/09/2012   CHOLHDL 4 09/09/2012   Using medications without problems: None  Muscle aches: None  Diet compliance:Moderate  Exercise: None Other complaints:   Prediabetes:  Due for re-eval. Sister with DM. Eating a lot of carbs lately.   Hx of iron def anemia. She has been tired recently.   Review of Systems  Constitutional: Positive for fatigue. Negative for fever.  HENT: Negative for ear pain.   Eyes: Negative for pain.  Respiratory: Negative for chest tightness and shortness of breath.   Cardiovascular: Negative for chest pain, palpitations and leg swelling.  Gastrointestinal: Negative for abdominal pain.  Genitourinary: Negative for dysuria.       Objective:   Physical Exam  Constitutional: Vital signs are normal. She appears well-developed and well-nourished. She is cooperative.  Non-toxic appearance. She does not appear ill. No distress.  Increase in central weight.  HENT:  Head: Normocephalic.  Right Ear: Hearing, tympanic membrane, external ear and ear canal normal. Tympanic membrane is not  erythematous, not retracted and not bulging.  Left Ear: Hearing, tympanic membrane, external ear and ear canal normal. Tympanic membrane is not erythematous, not retracted and not bulging.  Nose: No mucosal edema or rhinorrhea. Right sinus exhibits no maxillary sinus tenderness and no frontal sinus tenderness. Left sinus exhibits no maxillary sinus tenderness and no frontal sinus tenderness.  Mouth/Throat: Uvula is midline, oropharynx is clear and moist and mucous membranes are normal.  Eyes: Conjunctivae, EOM and lids are normal. Pupils are equal, round, and reactive to light. Lids are everted and swept, no foreign bodies found.  Neck: Trachea normal and normal range of motion. Neck supple. Carotid bruit is not present. No mass and no thyromegaly present.  Cardiovascular: Normal rate, regular rhythm, S1 normal, S2 normal, normal heart sounds, intact distal pulses and normal pulses.  Exam reveals no gallop and no friction rub.   No murmur heard. Pulmonary/Chest: Effort normal and breath sounds normal. Not tachypneic. No respiratory distress. She has no decreased breath sounds. She has no wheezes. She has no rhonchi. She has no rales.  Abdominal: Soft. Normal appearance and bowel sounds are normal. There is no tenderness.  Neurological: She is alert.  Skin: Skin is warm, dry and intact. No rash noted.  Psychiatric: Her speech is normal and behavior is normal. Judgment and thought content normal. Her mood appears not anxious. Cognition and memory are normal. She does not exhibit a depressed mood.          Assessment & Plan:

## 2013-03-16 NOTE — Patient Instructions (Addendum)
Work on regular exercise. Restart Yoga. Stop at lab on way out. Return for CPX in  6 months with fasting labs prior.

## 2013-03-16 NOTE — Assessment & Plan Note (Signed)
Likely due to mood and fibromyalgia. Will eval with labs cbc, tsh etc.

## 2013-03-16 NOTE — Assessment & Plan Note (Signed)
Due for re-eval. 

## 2013-03-17 ENCOUNTER — Encounter: Payer: Self-pay | Admitting: *Deleted

## 2013-04-20 ENCOUNTER — Other Ambulatory Visit: Payer: Self-pay | Admitting: Family Medicine

## 2013-04-21 NOTE — Telephone Encounter (Signed)
Last office visit 03/16/2013.  Ok to refill?

## 2013-04-22 NOTE — Telephone Encounter (Signed)
Called to [facility] [address] 

## 2013-04-25 ENCOUNTER — Other Ambulatory Visit: Payer: Self-pay | Admitting: Family Medicine

## 2013-05-01 ENCOUNTER — Other Ambulatory Visit: Payer: Self-pay | Admitting: Family Medicine

## 2013-05-04 ENCOUNTER — Ambulatory Visit (INDEPENDENT_AMBULATORY_CARE_PROVIDER_SITE_OTHER): Payer: Medicare Other | Admitting: Family Medicine

## 2013-05-04 ENCOUNTER — Encounter: Payer: Self-pay | Admitting: Family Medicine

## 2013-05-04 VITALS — BP 120/70 | HR 81 | Temp 98.1°F | Ht 64.0 in | Wt 154.0 lb

## 2013-05-04 DIAGNOSIS — N3941 Urge incontinence: Secondary | ICD-10-CM

## 2013-05-04 DIAGNOSIS — R1032 Left lower quadrant pain: Secondary | ICD-10-CM

## 2013-05-04 DIAGNOSIS — K589 Irritable bowel syndrome without diarrhea: Secondary | ICD-10-CM

## 2013-05-04 DIAGNOSIS — N952 Postmenopausal atrophic vaginitis: Secondary | ICD-10-CM

## 2013-05-04 MED ORDER — EZETIMIBE 10 MG PO TABS: 10.0000 mg | ORAL_TABLET | Freq: Every day | ORAL | Status: DC

## 2013-05-04 MED ORDER — ESTROGENS, CONJUGATED 0.625 MG/GM VA CREA: TOPICAL_CREAM | Freq: Every day | VAGINAL | Status: DC

## 2013-05-04 NOTE — Patient Instructions (Addendum)
Get back to healthy diet... Increase fiber in diet. Increase water. Restart premarin cream. Continue current medications started at urgent care. Restart the zetia.

## 2013-05-04 NOTE — Progress Notes (Signed)
Subjective:    Patient ID: Shannon Hancock, female    DOB: Oct 29, 1950, 62 y.o.   MRN: [id number]  HPI  62 year old  Postmenopausal female with history of depression, fibromyalgia, HTN, Chronic interstitial cystitis, IBS presents with several new symtpoms. She was seen at urgent care last weekend for weight gain, decreased appetite, abdominal bloating, decreased energy.  Has had these symtpoms in last 2 months.  She has noted that she has had more incontinence ( has had previous stress incontinence) without any intraabdominal pressure change. No dysuria, no blood in urine.  was on vesicare ( had been on for 3 years)  At urgent care she felt she had urge incontinence. Changes to the mybetriq. Given on exam at urgent care >> LLQ soreness. Dx with diverticulitis/ IBS.  Triggered by nuts she has been eating a lot. Started on 10 days of amox and flagyl for 10 days.   Wt Readings from Last 3 Encounters:  05/04/13 154 lb (69.854 kg)  03/16/13 156 lb (70.761 kg)  03/02/13 151 lb 4 oz (68.607 kg)   Has seen urologist in the past... For urge incontinence and chronic interstitial cystitis.   Since starting these med... Incontinence has improved. She feels less bloating,  Mild abdominal soreness in LLQ improved.   Has been off estrogen ring and premarin cream.. Was on these for atrophic vaginitis in the past... She would like to restart this given vaginal iritation return.   Review of Systems  Constitutional: Negative for fever and fatigue.  HENT: Negative for ear pain.   Eyes: Negative for pain.  Respiratory: Negative for chest tightness and shortness of breath.   Cardiovascular: Negative for chest pain, palpitations and leg swelling.  Gastrointestinal: Positive for abdominal pain and abdominal distention. Negative for nausea, diarrhea and constipation.  Genitourinary: Negative for dysuria.       Objective:   Physical Exam  Constitutional: Vital signs are normal. She appears  well-developed and well-nourished. She is cooperative.  Non-toxic appearance. She does not appear ill. No distress.  HENT:  Head: Normocephalic.  Right Ear: Hearing, tympanic membrane, external ear and ear canal normal. Tympanic membrane is not erythematous, not retracted and not bulging.  Left Ear: Hearing, tympanic membrane, external ear and ear canal normal. Tympanic membrane is not erythematous, not retracted and not bulging.  Nose: No mucosal edema or rhinorrhea. Right sinus exhibits no maxillary sinus tenderness and no frontal sinus tenderness. Left sinus exhibits no maxillary sinus tenderness and no frontal sinus tenderness.  Mouth/Throat: Uvula is midline, oropharynx is clear and moist and mucous membranes are normal.  Eyes: Conjunctivae, EOM and lids are normal. Pupils are equal, round, and reactive to light. Lids are everted and swept, no foreign bodies found.  Neck: Trachea normal and normal range of motion. Neck supple. Carotid bruit is not present. No mass and no thyromegaly present.  Cardiovascular: Normal rate, regular rhythm, S1 normal, S2 normal, normal heart sounds, intact distal pulses and normal pulses.  Exam reveals no gallop and no friction rub.   No murmur heard. Pulmonary/Chest: Effort normal and breath sounds normal. Not tachypneic. No respiratory distress. She has no decreased breath sounds. She has no wheezes. She has no rhonchi. She has no rales.  Abdominal: Soft. Normal appearance and bowel sounds are normal. There is no tenderness.  Neurological: She is alert.  Skin: Skin is warm, dry and intact. No rash noted.  Psychiatric: Her speech is normal and behavior is normal. Judgment and thought content normal.  Her mood appears not anxious. Cognition and memory are normal. She does not exhibit a depressed mood.          Assessment & Plan:

## 2013-05-05 DIAGNOSIS — R1032 Left lower quadrant pain: Secondary | ICD-10-CM | POA: Insufficient documentation

## 2013-05-05 DIAGNOSIS — N952 Postmenopausal atrophic vaginitis: Secondary | ICD-10-CM | POA: Insufficient documentation

## 2013-05-05 DIAGNOSIS — N3941 Urge incontinence: Secondary | ICD-10-CM | POA: Insufficient documentation

## 2013-05-05 NOTE — Assessment & Plan Note (Signed)
Started on antibiotics by urgent care for diverticulitis empirically without any imaging.. Pt has had improvement in symptoms. Complete antibiotics. IF not continuing to improve will consider further eval.

## 2013-05-05 NOTE — Assessment & Plan Note (Signed)
Will restart premarin vag cream but if not improving recommend return to GYN for possible return to estrogen ring as this helped mor ein past.

## 2013-05-05 NOTE — Assessment & Plan Note (Signed)
Likely contributing to current symptoms.. Restart bentyl. Increase fiber and water in diet.

## 2013-05-05 NOTE — Assessment & Plan Note (Signed)
Vesicare ineffective. Improvement on mybetriq... Continue.

## 2013-05-07 ENCOUNTER — Encounter: Payer: Self-pay | Admitting: Family Medicine

## 2013-05-07 ENCOUNTER — Ambulatory Visit (INDEPENDENT_AMBULATORY_CARE_PROVIDER_SITE_OTHER): Payer: Medicare Other | Admitting: Family Medicine

## 2013-05-07 VITALS — BP 120/82 | HR 80 | Temp 98.4°F | Ht 64.0 in | Wt 151.0 lb

## 2013-05-07 DIAGNOSIS — I10 Essential (primary) hypertension: Secondary | ICD-10-CM

## 2013-05-07 MED ORDER — LOSARTAN POTASSIUM-HCTZ 100-25 MG PO TABS: 1.0000 | ORAL_TABLET | Freq: Every day | ORAL | Status: DC

## 2013-05-07 NOTE — Patient Instructions (Signed)
Increase to losartan HCTZ 100/ 25 mg daily.. Change to AM dose. Follow BP at home.. Goal <140/90... Call with measurements or MyChart after 1-2 weeks.

## 2013-05-07 NOTE — Progress Notes (Signed)
  Subjective:    Patient ID: Shannon Hancock, female    DOB: Aug 21, 1950, 62 y.o.   MRN: [id number]  HPI 62 year old female with anxiety and HTN presents with recently elevated BP measurements.  Has been having headache, pound with elevated BP in last few days.  Hypertension:  On losartan HCTZ 100/12.5 Using medication without problems or lightheadedness: None Chest pain with exertion:None.Marland Kitchen Has had chest pain in past from  GERD. Now she has had resolution of this. Edema:None Short of breath:None Average home BPs: 125-140/95-105 HR 98-116  She has compared our cuff. Other issues:   Review of Systems  Constitutional: Negative for fever and fatigue.  HENT: Negative for ear pain.   Eyes: Negative for pain.  Respiratory: Negative for chest tightness and shortness of breath.   Cardiovascular: Negative for chest pain, palpitations and leg swelling.  Gastrointestinal: Negative for abdominal pain.  Genitourinary: Negative for dysuria.  Neurological: Positive for headaches. Negative for seizures, syncope and light-headedness.       Objective:   Physical Exam  Constitutional: Vital signs are normal. She appears well-developed and well-nourished. She is cooperative.  Non-toxic appearance. She does not appear ill. No distress.  HENT:  Head: Normocephalic.  Right Ear: Hearing, tympanic membrane, external ear and ear canal normal. Tympanic membrane is not erythematous, not retracted and not bulging.  Left Ear: Hearing, tympanic membrane, external ear and ear canal normal. Tympanic membrane is not erythematous, not retracted and not bulging.  Nose: No mucosal edema or rhinorrhea. Right sinus exhibits no maxillary sinus tenderness and no frontal sinus tenderness. Left sinus exhibits no maxillary sinus tenderness and no frontal sinus tenderness.  Mouth/Throat: Uvula is midline, oropharynx is clear and moist and mucous membranes are normal.  Eyes: Conjunctivae, EOM and lids are normal. Pupils  are equal, round, and reactive to light. Lids are everted and swept, no foreign bodies found.  Neck: Trachea normal and normal range of motion. Neck supple. Carotid bruit is not present. No mass and no thyromegaly present.  Cardiovascular: Normal rate, regular rhythm, S1 normal, S2 normal, normal heart sounds, intact distal pulses and normal pulses.  Exam reveals no gallop and no friction rub.   No murmur heard. Pulmonary/Chest: Effort normal and breath sounds normal. Not tachypneic. No respiratory distress. She has no decreased breath sounds. She has no wheezes. She has no rhonchi. She has no rales.  Abdominal: Soft. Normal appearance and bowel sounds are normal. There is no tenderness.  LLQ pain resolved  Neurological: She is alert.  Skin: Skin is warm, dry and intact. No rash noted.  Psychiatric: Her speech is normal and behavior is normal. Judgment and thought content normal. Her mood appears not anxious. Cognition and memory are normal. She does not exhibit a depressed mood.          Assessment & Plan:

## 2013-05-07 NOTE — Progress Notes (Signed)
Pre-visit discussion using our clinic review tool. No additional management support is needed unless otherwise documented below in the visit note.  

## 2013-05-20 ENCOUNTER — Encounter: Payer: Self-pay | Admitting: Family Medicine

## 2013-05-20 ENCOUNTER — Ambulatory Visit (INDEPENDENT_AMBULATORY_CARE_PROVIDER_SITE_OTHER): Payer: Medicare Other | Admitting: Family Medicine

## 2013-05-20 VITALS — BP 132/72 | HR 75 | Temp 97.6°F | Ht 64.0 in | Wt 153.5 lb

## 2013-05-20 DIAGNOSIS — I10 Essential (primary) hypertension: Secondary | ICD-10-CM

## 2013-05-20 MED ORDER — AMLODIPINE BESYLATE 5 MG PO TABS: 5.0000 mg | ORAL_TABLET | Freq: Every day | ORAL | Status: DC

## 2013-05-20 NOTE — Progress Notes (Signed)
  Subjective:    Patient ID: Shannon Hancock, female    DOB: 1951/05/20, 63 y.o.   MRN: [id number]  HPI  62 year old female with anxiety and HTN presents with recently elevated BP measurements.  At last OV 2 weeks ago... Losartan HCTZ increased to 100/25 mg daily.  She reports that  BP still running high.. Has compared cuffs. Hypertension: On losartan HCTZ 100/25 mg Using medication without problems or lightheadedness: None  Chest pain with exertion:None.Marland Kitchen Has had chest pain in past from GERD. Now she has had resolution of this.  Edema:None  Short of breath:None  Average home BPs:  146/90-152/92   She was on benicar in past but stopped due to cost.  She has decided to go back to vesicare instead of mybetriq given she saw on TV it may cause urinary retention and elevated BP.  Wt Readings from Last 3 Encounters:  05/20/13 153 lb 8 oz (69.627 kg)  05/07/13 151 lb (68.493 kg)  05/04/13 154 lb (69.854 kg)      Review of Systems  Constitutional: Negative for fever and fatigue.  HENT: Negative for ear pain.   Eyes: Negative for pain.  Respiratory: Negative for chest tightness and shortness of breath.   Cardiovascular: Negative for chest pain, palpitations and leg swelling.  Gastrointestinal: Negative for abdominal pain.  Genitourinary: Negative for dysuria.       Objective:   Physical Exam  Constitutional: Vital signs are normal. She appears well-developed and well-nourished. She is cooperative.  Non-toxic appearance. She does not appear ill. No distress.  HENT:  Head: Normocephalic.  Right Ear: Hearing, tympanic membrane, external ear and ear canal normal. Tympanic membrane is not erythematous, not retracted and not bulging.  Left Ear: Hearing, tympanic membrane, external ear and ear canal normal. Tympanic membrane is not erythematous, not retracted and not bulging.  Nose: No mucosal edema or rhinorrhea. Right sinus exhibits no maxillary sinus tenderness and no frontal  sinus tenderness. Left sinus exhibits no maxillary sinus tenderness and no frontal sinus tenderness.  Mouth/Throat: Uvula is midline, oropharynx is clear and moist and mucous membranes are normal.  Eyes: Conjunctivae, EOM and lids are normal. Pupils are equal, round, and reactive to light. Lids are everted and swept, no foreign bodies found.  Neck: Trachea normal and normal range of motion. Neck supple. Carotid bruit is not present. No mass and no thyromegaly present.  Cardiovascular: Normal rate, regular rhythm, S1 normal, S2 normal, normal heart sounds, intact distal pulses and normal pulses.  Exam reveals no gallop and no friction rub.   No murmur heard. Pulmonary/Chest: Effort normal and breath sounds normal. Not tachypneic. No respiratory distress. She has no decreased breath sounds. She has no wheezes. She has no rhonchi. She has no rales.  Abdominal: Soft. Normal appearance and bowel sounds are normal. There is no tenderness.  Neurological: She is alert.  Skin: Skin is warm, dry and intact. No rash noted.  Psychiatric: Her speech is normal and behavior is normal. Judgment and thought content normal. Her mood appears not anxious. Cognition and memory are normal. She does not exhibit a depressed mood.          Assessment & Plan:

## 2013-05-20 NOTE — Patient Instructions (Addendum)
Work on regular exercise and weight loss. Low salt diet. Start amlodipine 5 mg daily. Follow BPs at home, goal is less than 140/90. Call in 1 week with BP measurements.

## 2013-05-20 NOTE — Progress Notes (Signed)
Pre-visit discussion using our clinic review tool. No additional management support is needed unless otherwise documented below in the visit note.  

## 2013-06-06 NOTE — Assessment & Plan Note (Signed)
Work on regular exercise and weight loss. Low salt diet. Start amlodipine 5 mg daily. Follow BPs at home, goal is less than 140/90. Call in 1 week with BP measurements.   

## 2013-06-09 ENCOUNTER — Other Ambulatory Visit: Payer: Self-pay | Admitting: Family Medicine

## 2013-06-09 NOTE — Telephone Encounter (Signed)
Last office visit 05/20/2013.  Ok to refill? 

## 2013-06-10 NOTE — Telephone Encounter (Signed)
Called to [facility] [address] 

## 2013-06-17 ENCOUNTER — Other Ambulatory Visit: Payer: Self-pay | Admitting: Family Medicine

## 2013-06-17 NOTE — Telephone Encounter (Signed)
Lyrica prescription faxed to

## 2013-06-17 NOTE — Telephone Encounter (Signed)
[facility] [address]

## 2013-06-17 NOTE — Telephone Encounter (Signed)
Last office visit 05/20/2013.  Ok to refill? 

## 2013-07-29 ENCOUNTER — Other Ambulatory Visit: Payer: Self-pay | Admitting: Family Medicine

## 2013-07-30 NOTE — Telephone Encounter (Signed)
Called to [facility]

## 2013-07-30 NOTE — Telephone Encounter (Signed)
Last office visit 05/20/2013.   Last filled 06/09/2013 for #120.  Ok to refill?

## 2013-08-14 ENCOUNTER — Other Ambulatory Visit: Payer: Self-pay | Admitting: Family Medicine

## 2013-08-21 ENCOUNTER — Other Ambulatory Visit: Payer: Self-pay | Admitting: Family Medicine

## 2013-08-22 NOTE — Telephone Encounter (Signed)
Last office visit 05/20/2013.  Ok to refill?

## 2013-09-08 ENCOUNTER — Other Ambulatory Visit: Payer: Self-pay | Admitting: Family Medicine

## 2013-09-09 NOTE — Telephone Encounter (Signed)
Phoned in to pharmacy. 

## 2013-09-14 ENCOUNTER — Ambulatory Visit: Payer: Medicare Other | Admitting: Family Medicine

## 2013-09-14 DIAGNOSIS — Z0289 Encounter for other administrative examinations: Secondary | ICD-10-CM

## 2013-10-06 ENCOUNTER — Other Ambulatory Visit: Payer: Self-pay | Admitting: Family Medicine

## 2013-10-06 NOTE — Telephone Encounter (Signed)
Last office visit 11.24.2014.  Ok to refill?

## 2013-10-11 ENCOUNTER — Encounter: Payer: Self-pay | Admitting: Cardiovascular Disease

## 2013-10-11 ENCOUNTER — Ambulatory Visit (INDEPENDENT_AMBULATORY_CARE_PROVIDER_SITE_OTHER): Payer: Medicare Other | Admitting: Cardiovascular Disease

## 2013-10-11 ENCOUNTER — Encounter (INDEPENDENT_AMBULATORY_CARE_PROVIDER_SITE_OTHER): Payer: Self-pay

## 2013-10-11 VITALS — BP 115/72 | HR 70 | Ht 64.0 in | Wt 147.2 lb

## 2013-10-11 DIAGNOSIS — R079 Chest pain, unspecified: Secondary | ICD-10-CM

## 2013-10-11 DIAGNOSIS — R55 Syncope and collapse: Secondary | ICD-10-CM

## 2013-10-11 DIAGNOSIS — I10 Essential (primary) hypertension: Secondary | ICD-10-CM

## 2013-10-11 NOTE — Patient Instructions (Signed)
Your physician recommends that you schedule a follow-up appointment in:  As needed  Your physician recommends that you continue on your current medications as directed. Please refer to the Current Medication list given to you today.  

## 2013-10-11 NOTE — Assessment & Plan Note (Signed)
Her blood pressure is well controlled. Continue with her current medications. She'll followup with her primary medical doctor.

## 2013-10-11 NOTE — Progress Notes (Signed)
Shannon Hancock Date of Birth  1951/01/22       [facility]    [employer] [address], [address], [place], [place]  [address]   [place], [place]  [address] [address]   Fax  [phone]     Fax [phone]  Problem List: 1. Hypertension 2. Anxiety 3. Presyncope 4. GERD 5. Fibromyalgia   History of Present Illness:  She presents today for evaluation of several issues. On 2 occasions she's had pre-syncope.   Both of these episodes of presyncope occurred when she was very upset papules. I suspect that these were vasovagal. There is nothing suspicious about these episodes.   She also has a family hx of CHF and wanted to be evaluated.    She mentioned to her medical doctor about some episodes of GERD like CP and was given prolosec - which has helped.   She just started an exercise program.  She walked 1 1/2 mile each day.   [facility].  She used to have some dyspnea with walking but is doing OK for now.    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    Current Outpatient Prescriptions on File Prior to Visit  Medication Sig Dispense Refill  . ALPRAZolam (XANAX) 1 MG tablet TAKE 1 TABLET BY MOUTH 4 TIMES A DAY AS NEEDED FOR SLEEP OR ANXIETY  120 tablet  0  . amLODipine (NORVASC) 5 MG tablet Take 1 tablet (5 mg total) by mouth daily.  30 tablet  11  . dicyclomine (BENTYL) 20 MG tablet Take 20 mg by mouth every 6 (six) hours.      Marland Kitchen ezetimibe (ZETIA) 10 MG tablet Take 1 tablet (10 mg total) by mouth daily.  90 tablet  3  . fexofenadine (ALLEGRA) 180 MG tablet Take 180 mg by mouth daily.      Marland Kitchen losartan-hydrochlorothiazide (HYZAAR) 100-25 MG per tablet Take 1 tablet by mouth daily.  90 tablet  3  . LYRICA 100 MG capsule TAKE ONE CAPSULE TWICE A DAY  60 capsule  3  . metaxalone (SKELAXIN) 800 MG tablet TAKE 1 TABLET BY MOUTH TWICE A DAY  60 tablet  0  . omeprazole (PRILOSEC) 20 MG  capsule Take 20 mg by mouth daily.      . VESICARE 10 MG tablet TAKE 1 TABLET (10 MG TOTAL) BY MOUTH DAILY.  84 tablet  1  . [DISCONTINUED] solifenacin (VESICARE) 10 MG tablet Take 5 mg by mouth daily.         No current facility-administered medications on file prior to visit.    Allergies  Allergen Reactions  . Aspirin     Make stomach upset  . Olive Oil     Past Medical History  Diagnosis Date  . Allergy   . Hypertension   . Fibromyalgia   . Diverticulosis   . Heart murmur     Past Surgical History  Procedure Laterality Date  . Tubal ligation    . Foot surgery      History  Smoking status  . Former Smoker  Smokeless tobacco  . Never Used    History  Alcohol Use No    Family History  Problem Relation Age of Onset  . Alzheimer's disease Mother   . Stroke Mother   . Coronary artery disease Father   . Hypertension Father   .  Depression Father   . Cancer Father     prostate  . Cancer Sister     breast  . Cancer Brother     prostate  . Hypertension Brother   . Hyperlipidemia Brother     Reviw of Systems:  Reviewed in the HPI.  All other systems are negative.  Physical Exam: Blood pressure 115/72, pulse 70, height 5\' 4"  (1.626 m), weight 147 lb 4 oz (66.792 kg). Wt Readings from Last 3 Encounters:  10/11/13 147 lb 4 oz (66.792 kg)  05/20/13 153 lb 8 oz (69.627 kg)  05/07/13 151 lb (68.493 kg)     General: Well developed, well nourished, in no acute distress.  Head: Normocephalic, atraumatic, sclera non-icteric, mucus membranes are moist,   Neck: Supple. Carotids are 2 + without bruits. No JVD  Lungs: Clear  Heart: RR, normal S1S2, no significant murmurs Abdomen: Soft, non-tender, non-distended with normal bowel sounds. Msk:  Strength and tone are normal  Extremities: No clubbing or cyanosis. No edema.  Distal pedal pulses are 2+ and equal  Neuro: CN II - XII intact.  Alert and oriented X 3.  Psych:  Normal   ECG: 10/11/2013: Normal sinus  rhythm. Has nonspecific ST and T wave changes.  Assessment / Plan:

## 2013-10-11 NOTE — Assessment & Plan Note (Signed)
She actually never had an episode of syncope. She describes presyncope during very stressful episodes while she was at a funeral. I suspect these are vasovagal. She's not had any episodes of syncope or presyncope while exercising. Her cardiac exam sounds normal. I do not think this needs any further workup unless  she has more symptoms.

## 2013-10-22 ENCOUNTER — Encounter: Payer: Self-pay | Admitting: Family Medicine

## 2013-10-22 ENCOUNTER — Telehealth: Payer: Self-pay | Admitting: Family Medicine

## 2013-10-22 ENCOUNTER — Ambulatory Visit (INDEPENDENT_AMBULATORY_CARE_PROVIDER_SITE_OTHER): Payer: Medicare Other | Admitting: Family Medicine

## 2013-10-22 ENCOUNTER — Encounter: Payer: Self-pay | Admitting: Radiology

## 2013-10-22 VITALS — BP 127/70 | HR 71 | Temp 97.8°F | Ht 64.0 in | Wt 147.0 lb

## 2013-10-22 DIAGNOSIS — I10 Essential (primary) hypertension: Secondary | ICD-10-CM

## 2013-10-22 DIAGNOSIS — E7800 Pure hypercholesterolemia, unspecified: Secondary | ICD-10-CM

## 2013-10-22 MED ORDER — ALPRAZOLAM 1 MG PO TABS: ORAL_TABLET | ORAL | Status: DC

## 2013-10-22 NOTE — Addendum Note (Signed)
Addended by: Eliezer Lofts E on: 10/22/2013 09:40 AM   Modules accepted: Orders

## 2013-10-22 NOTE — Progress Notes (Signed)
   Subjective:    Patient ID: [name], female    DOB: 1951/02/13, 63 y.o.   MRN: [id number]  HPI  63 year old female presents for 6 month follow up.  Hypertension:  Well controlled on amlodipine and losartan HCTZ. BP Readings from Last 3 Encounters:  10/22/13 127/70  10/11/13 115/72  05/20/13 132/72   Using medication without problems or lightheadedness:  none Chest pain with exertion:None Edema:None Short of breath:None Average home BPs: not checking Other issues:   Walking a mile daily  Planning on doing silver sneakers, yoga and pilates  Wt Readings from Last 3 Encounters:  10/22/13 147 lb (66.679 kg)  10/11/13 147 lb 4 oz (66.792 kg)  05/20/13 153 lb 8 oz (69.627 kg)    She is no longer having cramping.Marland Kitchen She wonders if she needs to use bentyl.      Review of Systems     Objective:   Physical Exam        Assessment & Plan:

## 2013-10-22 NOTE — Progress Notes (Signed)
Pre visit review using our clinic review tool, if applicable. No additional management support is needed unless otherwise documented below in the visit note. 

## 2013-10-22 NOTE — Assessment & Plan Note (Signed)
Encouraged exercise, weight loss, healthy eating habits. ? ?

## 2013-10-22 NOTE — Patient Instructions (Addendum)
Keep working on healthy eating, exercise and weight loss. Use bentyl only as needed. Schedule CPX in 6 months with labs prior.

## 2013-10-22 NOTE — Telephone Encounter (Signed)
Relevant patient education assigned to patient using Emmi. ° °

## 2013-10-22 NOTE — Assessment & Plan Note (Signed)
Well controlled. Continue current medication.  

## 2013-10-29 ENCOUNTER — Telehealth: Payer: Self-pay | Admitting: Family Medicine

## 2013-10-29 NOTE — Telephone Encounter (Signed)
Noted. Pt needs appt to be seen.

## 2013-10-29 NOTE — Telephone Encounter (Signed)
Patient Information:  Caller Name: Shatyra  Phone: [phone]  Patient: Shannon Hancock, Shannon Hancock  Gender: Female  DOB: 02-May-1951  Age: 63 Years  PCP: Eliezer Lofts (Family Practice)  Office Follow Up:  Does the office need to follow up with this patient?: No  Instructions For The Office: N/A  RN Note:  Patient states her blood pressure has increased over the past week.  States BP 10/29/13 was 151/101.  States she can feel a headache when she is stressed, and she has had some stress over the past few days.  States she has checked her BP daily, but feels this is "completely stress related."  States her "telltale sign is a moderate headache."  Per hypertension protocol, emergent symptoms denied; advised appt within 2 weeks.  States is busy 11/02/13/first available with Dr. Diona Browner; transferred to front office to schedule appt.  krs/can  Symptoms  Reason For Call & Symptoms: hypertension  Reviewed Health History In EMR: Yes  Reviewed Medications In EMR: Yes  Reviewed Allergies In EMR: Yes  Reviewed Surgeries / Procedures: Yes  Date of Onset of Symptoms: 10/22/2013  Guideline(s) Used:  High Blood Pressure  Disposition Per Guideline:   See Within 2 Weeks in Office  Reason For Disposition Reached:   BP > 140/90 and is taking BP medications  Advice Given:  N/A  Patient Will Follow Care Advice:  YES

## 2013-10-30 ENCOUNTER — Other Ambulatory Visit: Payer: Self-pay | Admitting: Family Medicine

## 2013-10-31 NOTE — Telephone Encounter (Signed)
Last office visit 10/22/2013.  Ok to refill?

## 2013-11-03 ENCOUNTER — Ambulatory Visit (INDEPENDENT_AMBULATORY_CARE_PROVIDER_SITE_OTHER): Payer: Medicare Other | Admitting: Family Medicine

## 2013-11-03 ENCOUNTER — Encounter: Payer: Self-pay | Admitting: Family Medicine

## 2013-11-03 VITALS — BP 146/78 | HR 81 | Temp 97.6°F | Ht 64.0 in | Wt 151.0 lb

## 2013-11-03 DIAGNOSIS — F321 Major depressive disorder, single episode, moderate: Secondary | ICD-10-CM

## 2013-11-03 DIAGNOSIS — N3941 Urge incontinence: Secondary | ICD-10-CM

## 2013-11-03 DIAGNOSIS — F411 Generalized anxiety disorder: Secondary | ICD-10-CM

## 2013-11-03 DIAGNOSIS — I10 Essential (primary) hypertension: Secondary | ICD-10-CM

## 2013-11-03 DIAGNOSIS — K589 Irritable bowel syndrome without diarrhea: Secondary | ICD-10-CM

## 2013-11-03 DIAGNOSIS — N952 Postmenopausal atrophic vaginitis: Secondary | ICD-10-CM

## 2013-11-03 LAB — POCT URINALYSIS DIPSTICK
Bilirubin, UA: NEGATIVE
Glucose, UA: NEGATIVE
KETONES UA: NEGATIVE
Nitrite, UA: NEGATIVE
PH UA: 7.5
PROTEIN UA: NEGATIVE
UROBILINOGEN UA: 0.2

## 2013-11-03 MED ORDER — LOSARTAN POTASSIUM 100 MG PO TABS: 100.0000 mg | ORAL_TABLET | Freq: Every day | ORAL | Status: DC

## 2013-11-03 MED ORDER — AMLODIPINE BESYLATE 10 MG PO TABS: 10.0000 mg | ORAL_TABLET | Freq: Every day | ORAL | Status: DC

## 2013-11-03 MED ORDER — ESTROGENS, CONJUGATED 0.625 MG/GM VA CREA: 1.0000 | TOPICAL_CREAM | Freq: Every day | VAGINAL | Status: DC

## 2013-11-03 NOTE — Progress Notes (Signed)
Pre visit review using our clinic review tool, if applicable. No additional management support is needed unless otherwise documented below in the visit note. 

## 2013-11-03 NOTE — Assessment & Plan Note (Signed)
Poor control likely causing a lot of her somatic complaints.  She is hesitant about med other than alprazolam. Refer back to counseling.  At follow up consider ssri or other med.

## 2013-11-03 NOTE — Addendum Note (Signed)
Addended by: Carter Kitten on: 11/03/2013 10:56 AM   Modules accepted: Orders

## 2013-11-03 NOTE — Progress Notes (Signed)
   Subjective:    Patient ID: Shannon Hancock, female    DOB: [birth year], 63 y.o.   MRN: [id number]  HPI  63 year old female with hx of HTN presents with multiple issues.  1. HTN: recent increased BPS on amlodipine 5 mg daily, Hyzaar max 123-141/73-90 BP Readings from Last 3 Encounters:  11/03/13 146/78  10/22/13 127/70  10/11/13 115/72  Some headache with BP elevated, tension. BP decreases with xanax.  2. Increased stress and anxiety: Using alprazolam 2-3 a day. When upset she feels SOB and chest tightness.   Alprazolam helps temporarily.   Has had SE to wellbutrin, clonazepam, cymbalta.  She is open to trying a different medication. Has seen Dr. Rexene Edison  She uses alprazolam for anxiety/ panic attacks.  3. Urinary incontinence: on vesicare for urge incontinence  no dysuria, no fever, mild abdominal pressure. Also element of stress incontinence.  She feels like worse with HCTZ.   Review of Systems  Constitutional: Negative for fever and fatigue.  HENT: Negative for ear pain.   Eyes: Negative for pain.  Respiratory: Negative for chest tightness and shortness of breath.   Cardiovascular: Negative for chest pain, palpitations and leg swelling.  Gastrointestinal: Negative for abdominal pain.  Genitourinary: Negative for dysuria.       Objective:   Physical Exam  Constitutional: Vital signs are normal. She appears well-developed and well-nourished. She is cooperative.  Non-toxic appearance. She does not appear ill. No distress.  HENT:  Head: Normocephalic.  Right Ear: Hearing, tympanic membrane, external ear and ear canal normal. Tympanic membrane is not erythematous, not retracted and not bulging.  Left Ear: Hearing, tympanic membrane, external ear and ear canal normal. Tympanic membrane is not erythematous, not retracted and not bulging.  Nose: No mucosal edema or rhinorrhea. Right sinus exhibits no maxillary sinus tenderness and no frontal sinus tenderness. Left sinus  exhibits no maxillary sinus tenderness and no frontal sinus tenderness.  Mouth/Throat: Uvula is midline, oropharynx is clear and moist and mucous membranes are normal.  Eyes: Conjunctivae, EOM and lids are normal. Pupils are equal, round, and reactive to light. Lids are everted and swept, no foreign bodies found.  Neck: Trachea normal and normal range of motion. Neck supple. Carotid bruit is not present. No mass and no thyromegaly present.  Cardiovascular: Normal rate, regular rhythm, S1 normal, S2 normal, normal heart sounds, intact distal pulses and normal pulses.  Exam reveals no gallop and no friction rub.   No murmur heard. Pulmonary/Chest: Effort normal and breath sounds normal. Not tachypneic. No respiratory distress. She has no decreased breath sounds. She has no wheezes. She has no rhonchi. She has no rales.  Abdominal: Soft. Normal appearance. Bowel sounds are increased. There is no tenderness.  Neurological: She is alert.  Skin: Skin is warm, dry and intact. No rash noted.  Psychiatric: Her speech is normal and behavior is normal. Judgment and thought content normal. Her mood appears anxious. Cognition and memory are normal. She does not exhibit a depressed mood.          Assessment & Plan:

## 2013-11-03 NOTE — Assessment & Plan Note (Signed)
Use dicyclomine prn.

## 2013-11-03 NOTE — Patient Instructions (Addendum)
Stop at front desk to set up referral to counselor.  Change losartan HCTZ to losartan alone ( ie stopping HCTZ).  Increase and amlodipine to 10 mg daily.  Follow BP at home.  Can try dicyclomine  As needed 4 times daily.  We will call with urine culture results.  Follow up 30 min in 1-2 weeks.

## 2013-11-03 NOTE — Assessment & Plan Note (Signed)
UA showed blood but she does have IC with hematuria in past. Send for culture.  She feels HCTZ may be worsening symtpoms as vesicare no longer helping.  Will stop HCTZ , follow up in 2 weeks.

## 2013-11-03 NOTE — Assessment & Plan Note (Signed)
Moderate control. Stop HCTZ given urinary symtpoms, increase amlodipine and change to losartan.

## 2013-11-03 NOTE — Assessment & Plan Note (Signed)
Refilled premarin cream

## 2013-11-04 ENCOUNTER — Telehealth: Payer: Self-pay

## 2013-11-04 LAB — URINE CULTURE
Colony Count: NO GROWTH
Organism ID, Bacteria: NO GROWTH

## 2013-11-04 NOTE — Telephone Encounter (Signed)
Pt left v/m; received missed call and pt request cb about lab results.Please advise.

## 2013-11-09 ENCOUNTER — Telehealth: Payer: Self-pay | Admitting: Family Medicine

## 2013-11-09 ENCOUNTER — Encounter: Payer: Self-pay | Admitting: Internal Medicine

## 2013-11-09 ENCOUNTER — Ambulatory Visit (INDEPENDENT_AMBULATORY_CARE_PROVIDER_SITE_OTHER): Payer: Medicare Other | Admitting: Internal Medicine

## 2013-11-09 VITALS — BP 110/70 | HR 102 | Temp 98.6°F | Wt 151.0 lb

## 2013-11-09 DIAGNOSIS — R609 Edema, unspecified: Secondary | ICD-10-CM

## 2013-11-09 DIAGNOSIS — N3941 Urge incontinence: Secondary | ICD-10-CM

## 2013-11-09 DIAGNOSIS — I10 Essential (primary) hypertension: Secondary | ICD-10-CM

## 2013-11-09 NOTE — Assessment & Plan Note (Signed)
Clearly seems to be from the increased amlodipine (or stopping HCTZ) Stress incontinence no better She is very concerned about the edema--though it is mild Will decrease the amlodipine and change her back to the HCTZ losartan

## 2013-11-09 NOTE — Telephone Encounter (Signed)
Patient Information:  Caller Name: Jahnay  Phone: [phone]  Patient: Shannon Hancock, Shannon Hancock  Gender: Female  DOB: Feb 05, 1951  Age: 63 Years  PCP: Eliezer Lofts (Family Practice)  Office Follow Up:  Does the office need to follow up with this patient?: No  Instructions For The Office: N/A   Symptoms  Reason For Call & Symptoms: Patient calling about swelling in feet and ankles.  She also reports gaining a pound per day.Go to Office Now per Leg Swelling and Edema guideline due to Swellin of face, arm or hands.  Reviewed Health History In EMR: Yes  Reviewed Medications In EMR: Yes  Reviewed Allergies In EMR: Yes  Reviewed Surgeries / Procedures: Yes  Date of Onset of Symptoms: 11/08/2013  Treatments Tried: Leg elevation - some improvement over night.  Treatments Tried Worked: Yes  Guideline(s) Used:  Leg Swelling and Edema  Disposition Per Guideline:   Go to Office Now  Reason For Disposition Reached:   Swelling of face, arm or hands (Exception: slight puffiness of fingers during hot weather)  Advice Given:  Call Back If:  Swelling becomes worse  Swelling becomes red or painful to the touch  Calf pain occurs and becomes constant  You become worse.  Patient Will Follow Care Advice:  YES  Appointment Scheduled:  11/09/2013 12:30:00 Appointment Scheduled Provider:  Viviana Simpler [facility])

## 2013-11-09 NOTE — Progress Notes (Signed)
Subjective:    Patient ID: Shannon Hancock, female    DOB: 05-15-1951, 63 y.o.   MRN: [id number]  HPI Major concern is getting her BP regulated Had been going up at home Stopped the diuretic and increased the amlodipine (in hopes of helping incontinence)  Noticed swollen feet last night This never happened before No pain Gets some SOB at times--also gets some pain in chest that she relates to acid reflux Tries to exercise---walks a mile often. Her stamina is improving--actually trying to run for part of it Does silver sneakers at [facility] also  Current Outpatient Prescriptions on File Prior to Visit  Medication Sig Dispense Refill  . ALPRAZolam (XANAX) 1 MG tablet TAKE 1 TABLET BY MOUTH 4 TIMES A DAY AS NEEDED FOR SLEEP OR ANXIETY  120 tablet  0  . amLODipine (NORVASC) 10 MG tablet Take 1 tablet (10 mg total) by mouth daily.  30 tablet  11  . conjugated estrogens (PREMARIN) vaginal cream Place 1 Applicatorful vaginally daily.  30 g  0  . dicyclomine (BENTYL) 20 MG tablet Take 20 mg by mouth every 6 (six) hours.      Marland Kitchen ezetimibe (ZETIA) 10 MG tablet Take 1 tablet (10 mg total) by mouth daily.  90 tablet  3  . fexofenadine (ALLEGRA) 180 MG tablet Take 180 mg by mouth daily.      Marland Kitchen losartan (COZAAR) 100 MG tablet Take 1 tablet (100 mg total) by mouth daily.  30 tablet  11  . LYRICA 100 MG capsule TAKE ONE CAPSULE TWICE A DAY  60 capsule  3  . metaxalone (SKELAXIN) 800 MG tablet TAKE 1 TABLET BY MOUTH TWICE A DAY  60 tablet  0  . omeprazole (PRILOSEC) 20 MG capsule Take 20 mg by mouth daily.      . psyllium (METAMUCIL) 58.6 % powder Take 1 packet by mouth daily.      . VESICARE 10 MG tablet TAKE 1 TABLET (10 MG TOTAL) BY MOUTH DAILY.  84 tablet  1  . [DISCONTINUED] solifenacin (VESICARE) 10 MG tablet Take 5 mg by mouth daily.         No current facility-administered medications on file prior to visit.    Allergies  Allergen Reactions  . Aspirin     Make stomach upset  . Olive Oil       Past Medical History  Diagnosis Date  . Allergy   . Hypertension   . Fibromyalgia   . Diverticulosis   . Heart murmur     Past Surgical History  Procedure Laterality Date  . Tubal ligation    . Foot surgery      Family History  Problem Relation Age of Onset  . Alzheimer's disease Mother   . Stroke Mother   . Coronary artery disease Father   . Hypertension Father   . Depression Father   . Cancer Father     prostate  . Cancer Sister     breast  . Cancer Brother     prostate  . Hypertension Brother   . Hyperlipidemia Brother     History   Social History  . Marital Status: Married    Spouse Name: N/A    Number of Children: 2  . Years of Education: N/A   Occupational History  . 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 Smoking status: Former Research scientist (life sciences)  . Smokeless tobacco: Never Used  . Alcohol  Use: No  . Drug Use: No  . Sexual Activity: Not on file   Other Topics Concern  . Not on file   Social History Narrative   Regular exercise-no   Living will: None (reviewed 2014)   Review of Systems Not happy with her extra weight--trying to get this under control Urine control is not improved off the diuretic No constipation    Objective:   Physical Exam  Constitutional: She appears well-developed and well-nourished. No distress.  Neck: Normal range of motion. Neck supple. No thyromegaly present.  Cardiovascular: Normal rate and regular rhythm.  Exam reveals no gallop.   Soft systolic murmur at base  Pulmonary/Chest: Effort normal and breath sounds normal. No respiratory distress. She has no wheezes. She has no rales.  Musculoskeletal:  At most trace edema by malleoli in ankles  Lymphadenopathy:    She has no cervical adenopathy.  Psychiatric:  Anxious about all her medical issues          Assessment & Plan:

## 2013-11-09 NOTE — Patient Instructions (Signed)
Please change back to the losartan with HCTZ (100/12.5) and stop the plain losartan. Cut the amlodipine in half and only take 5mg  daily Try to stop the vesicare---if you see no change in your urinary control, stay off it. Please try to do the pelvic muscle exercises as often as possible (and try to keep your bladder empty).

## 2013-11-09 NOTE — Assessment & Plan Note (Signed)
BP Readings from Last 3 Encounters:  11/09/13 110/70  11/03/13 146/78  10/22/13 127/70   Lower now Will adjust meds as noted

## 2013-11-09 NOTE — Telephone Encounter (Signed)
Will evaluate at [facility] 

## 2013-11-09 NOTE — Assessment & Plan Note (Signed)
Doesn't think the vesicare has helped after a year (and it may not help urge incontinence) i asked her to be more consistent with Kegel exercises Try off the vesicare to see if it is helping

## 2013-11-12 ENCOUNTER — Ambulatory Visit (INDEPENDENT_AMBULATORY_CARE_PROVIDER_SITE_OTHER): Payer: Medicare Other | Admitting: Family Medicine

## 2013-11-12 ENCOUNTER — Encounter: Payer: Self-pay | Admitting: Family Medicine

## 2013-11-12 VITALS — BP 130/82 | HR 103 | Temp 98.0°F | Ht 64.0 in | Wt 151.5 lb

## 2013-11-12 DIAGNOSIS — I10 Essential (primary) hypertension: Secondary | ICD-10-CM

## 2013-11-12 DIAGNOSIS — N3941 Urge incontinence: Secondary | ICD-10-CM

## 2013-11-12 NOTE — Assessment & Plan Note (Signed)
Improved with kegel's . Off vesicare.  ? component of IC.Marland Kitchen Consider referral back to uro if continuing.

## 2013-11-12 NOTE — Assessment & Plan Note (Signed)
Edema resolved back on HCTZ and with lower dose amlodipine. Encouraged exercise, weight loss, healthy eating habits.

## 2013-11-12 NOTE — Progress Notes (Signed)
   Subjective:    Patient ID: Shannon Hancock, female    DOB: 31-Dec-1950, 63 y.o.   MRN: [id number]  HPI  63 year old female presents for follow up on HTN.  She was seen in last week by Dr. Silvio Pate for edema secondary to amlodipine.  We had started this in place of HCTZ losartan to see if we could get improvement in urinary urgency. BP is running ion ml range back on her previous medication along with low dose amlodipine.  She reports swelling has resolved. BP Readings from Last 3 Encounters:  11/12/13 130/82  11/09/13 110/70  11/03/13 146/78    For her anxiety... We have set her back up with Dr., perrin for counsling. She use alprazolam prn. She has been working on exercise.  She has started Kegel's, she has stopped  Vesicare.     Review of Systems  Constitutional: Negative for fever and fatigue.  HENT: Negative for ear pain.   Eyes: Negative for pain.  Respiratory: Negative for chest tightness and shortness of breath.   Cardiovascular: Negative for chest pain, palpitations and leg swelling.  Gastrointestinal: Negative for abdominal pain.  Genitourinary: Negative for dysuria.       Objective:   Physical Exam  Constitutional: Vital signs are normal. She appears well-developed and well-nourished. She is cooperative.  Non-toxic appearance. She does not appear ill. No distress.  HENT:  Head: Normocephalic.  Right Ear: Hearing, tympanic membrane, external ear and ear canal normal. Tympanic membrane is not erythematous, not retracted and not bulging.  Left Ear: Hearing, tympanic membrane, external ear and ear canal normal. Tympanic membrane is not erythematous, not retracted and not bulging.  Nose: No mucosal edema or rhinorrhea. Right sinus exhibits no maxillary sinus tenderness and no frontal sinus tenderness. Left sinus exhibits no maxillary sinus tenderness and no frontal sinus tenderness.  Mouth/Throat: Uvula is midline, oropharynx is clear and moist and mucous membranes  are normal.  Eyes: Conjunctivae, EOM and lids are normal. Pupils are equal, round, and reactive to light. Lids are everted and swept, no foreign bodies found.  Neck: Trachea normal and normal range of motion. Neck supple. Carotid bruit is not present. No mass and no thyromegaly present.  Cardiovascular: Normal rate, regular rhythm, S1 normal, S2 normal, normal heart sounds, intact distal pulses and normal pulses.  Exam reveals no gallop and no friction rub.   No murmur heard. Pulmonary/Chest: Effort normal and breath sounds normal. Not tachypneic. No respiratory distress. She has no decreased breath sounds. She has no wheezes. She has no rhonchi. She has no rales.  Abdominal: Soft. Normal appearance and bowel sounds are normal. There is no tenderness.  Neurological: She is alert.  Skin: Skin is warm, dry and intact. No rash noted.  Psychiatric: Her speech is normal and behavior is normal. Judgment and thought content normal. Her mood appears not anxious. Cognition and memory are normal. She does not exhibit a depressed mood.          Assessment & Plan:

## 2013-11-12 NOTE — Patient Instructions (Signed)
Decrease caffeine. Follow up in 3 month HTN check. Follow BP at home and call if > 3 measurements > 140/90.

## 2013-11-12 NOTE — Progress Notes (Signed)
Pre visit review using our clinic review tool, if applicable. No additional management support is needed unless otherwise documented below in the visit note. 

## 2013-11-24 ENCOUNTER — Encounter: Payer: Self-pay | Admitting: Family Medicine

## 2013-12-09 ENCOUNTER — Other Ambulatory Visit: Payer: Self-pay | Admitting: Family Medicine

## 2013-12-09 NOTE — Telephone Encounter (Signed)
Last office visit 11/12/2013.  Ok to refill?

## 2013-12-10 NOTE — Telephone Encounter (Addendum)
Alprazolam called to [facility] [address]

## 2013-12-14 ENCOUNTER — Encounter (INDEPENDENT_AMBULATORY_CARE_PROVIDER_SITE_OTHER): Payer: Self-pay

## 2013-12-14 ENCOUNTER — Ambulatory Visit (INDEPENDENT_AMBULATORY_CARE_PROVIDER_SITE_OTHER): Payer: Medicare Other | Admitting: Family Medicine

## 2013-12-14 DIAGNOSIS — I10 Essential (primary) hypertension: Secondary | ICD-10-CM

## 2013-12-14 NOTE — Progress Notes (Signed)
Pre visit review using our clinic review tool, if applicable. No additional management support is needed unless otherwise documented below in the visit note. 

## 2013-12-16 ENCOUNTER — Encounter: Payer: Self-pay | Admitting: Gastroenterology

## 2013-12-16 ENCOUNTER — Encounter: Payer: Self-pay | Admitting: Family Medicine

## 2013-12-16 ENCOUNTER — Ambulatory Visit (INDEPENDENT_AMBULATORY_CARE_PROVIDER_SITE_OTHER): Payer: Medicare Other | Admitting: Family Medicine

## 2013-12-16 VITALS — BP 120/70 | HR 68 | Temp 98.1°F | Wt 148.0 lb

## 2013-12-16 DIAGNOSIS — R141 Gas pain: Secondary | ICD-10-CM

## 2013-12-16 DIAGNOSIS — R142 Eructation: Secondary | ICD-10-CM

## 2013-12-16 DIAGNOSIS — K589 Irritable bowel syndrome without diarrhea: Secondary | ICD-10-CM

## 2013-12-16 DIAGNOSIS — R143 Flatulence: Secondary | ICD-10-CM

## 2013-12-16 DIAGNOSIS — I10 Essential (primary) hypertension: Secondary | ICD-10-CM

## 2013-12-16 DIAGNOSIS — R109 Unspecified abdominal pain: Secondary | ICD-10-CM

## 2013-12-16 DIAGNOSIS — R140 Abdominal distension (gaseous): Secondary | ICD-10-CM | POA: Insufficient documentation

## 2013-12-16 DIAGNOSIS — R609 Edema, unspecified: Secondary | ICD-10-CM

## 2013-12-16 MED ORDER — LOSARTAN POTASSIUM-HCTZ 100-12.5 MG PO TABS: 1.0000 | ORAL_TABLET | Freq: Every day | ORAL | Status: DC

## 2013-12-16 NOTE — Assessment & Plan Note (Signed)
Likely due to IBS. Pt not responding to bentyl.  ON fiber, pt has significant stress. Avoiding grease.  Will add probiotic and refer back to Dr. Deatra Ina for further eval/treatment.    Last colonoscopy in 2008 nml.

## 2013-12-16 NOTE — Patient Instructions (Addendum)
Stop losartan and restart losartan HCTZ to help with swelling. Continue amlodipine. Stop at front desk for referral to Dr. Deatra Ina, GI. Can start a probiotic lactobaccili Optician, dispensing). Schedule CPX and  Fasting labs prior in 3 months.

## 2013-12-16 NOTE — Assessment & Plan Note (Signed)
Well controlled but given edema( none on exam today) will restart low dose HCTZ.

## 2013-12-16 NOTE — Progress Notes (Signed)
Pre visit review using our clinic review tool, if applicable. No additional management support is needed unless otherwise documented below in the visit note. 

## 2013-12-16 NOTE — Progress Notes (Signed)
   Subjective:    Patient ID: Shannon Hancock, female    DOB: December 09, 1950, 63 y.o.   MRN: [id number]  HPI 63 year old female with IBS returns with continue abdominal bloating and  peripheral edema. Off and on constipation, but now BMs loose.  no fever. 4 days ago she had abdominal  cramping , rumbling, took gas ex and pain improved. She is taking bentyl 3 times a day.   She has been taking Losartan not losartan HCTZ as she was in the past. This was a refill error after decreasing amlodipine to 5 mg.  BP Readings from Last 3 Encounters:  12/16/13 120/70  11/12/13 130/82  11/09/13 110/70        Review of Systems  Constitutional: Positive for fatigue. Negative for fever.  HENT: Negative for ear pain.   Eyes: Negative for pain.  Respiratory: Negative for cough and wheezing.   Cardiovascular: Positive for leg swelling. Negative for chest pain and palpitations.  Gastrointestinal: Positive for abdominal pain, diarrhea, constipation and abdominal distention. Negative for nausea, blood in stool and anal bleeding.       Objective:   Physical Exam        Assessment & Plan:

## 2013-12-16 NOTE — Assessment & Plan Note (Signed)
Restart HCTZ. If not improving, consider increasing HCTZ and or stopping amlodipine.

## 2013-12-22 ENCOUNTER — Other Ambulatory Visit: Payer: Self-pay | Admitting: Family Medicine

## 2013-12-22 DIAGNOSIS — Z803 Family history of malignant neoplasm of breast: Secondary | ICD-10-CM

## 2013-12-22 NOTE — Progress Notes (Signed)
Pt cancelled

## 2013-12-31 ENCOUNTER — Other Ambulatory Visit: Payer: Self-pay | Admitting: Family Medicine

## 2014-01-01 NOTE — Telephone Encounter (Signed)
Last office visit 12/16/2013.  Ok to refill?

## 2014-01-12 ENCOUNTER — Other Ambulatory Visit: Payer: Self-pay

## 2014-01-19 ENCOUNTER — Inpatient Hospital Stay: Admission: RE | Admit: 2014-01-19 | Payer: Self-pay | Source: Ambulatory Visit

## 2014-02-23 ENCOUNTER — Ambulatory Visit: Payer: Self-pay | Admitting: Gastroenterology

## 2014-03-15 ENCOUNTER — Other Ambulatory Visit: Payer: Self-pay

## 2014-03-15 ENCOUNTER — Telehealth: Payer: Self-pay | Admitting: Family Medicine

## 2014-03-15 DIAGNOSIS — E7800 Pure hypercholesterolemia, unspecified: Secondary | ICD-10-CM

## 2014-03-15 DIAGNOSIS — D509 Iron deficiency anemia, unspecified: Secondary | ICD-10-CM

## 2014-03-15 DIAGNOSIS — M810 Age-related osteoporosis without current pathological fracture: Secondary | ICD-10-CM

## 2014-03-15 NOTE — Telephone Encounter (Signed)
Message copied by Jinny Sanders on Tue Mar 15, 2014  7:33 AM ------      Message from: Ellamae Sia      Created: Tue Mar 08, 2014 11:53 AM      Regarding: Lab orders for Tuesday, 9.15.15       Patient is scheduled for CPX labs, please order future labs, Thanks , Shannon Hancock       ------

## 2014-03-22 ENCOUNTER — Encounter: Payer: Medicare Other | Admitting: Family Medicine

## 2014-03-22 DIAGNOSIS — Z0289 Encounter for other administrative examinations: Secondary | ICD-10-CM

## 2014-03-23 ENCOUNTER — Telehealth: Payer: Self-pay | Admitting: Family Medicine

## 2014-03-23 NOTE — Telephone Encounter (Signed)
Patient did not come for their scheduled appointment 03/22/14 for cpx.  Please let me know if the patient needs to be contacted immediately for follow up or if no follow up is necessary.

## 2014-03-24 ENCOUNTER — Encounter: Payer: Self-pay | Admitting: Family Medicine

## 2014-03-24 NOTE — Telephone Encounter (Signed)
No follow up needed urgently.

## 2014-03-24 NOTE — Telephone Encounter (Signed)
See below

## 2014-03-24 NOTE — Telephone Encounter (Signed)
Sent no show letter to pt

## 2014-05-22 ENCOUNTER — Other Ambulatory Visit: Payer: Self-pay | Admitting: Family Medicine

## 2014-05-22 NOTE — Telephone Encounter (Signed)
Last office visit 12/16/2013.  Ok to refill?

## 2014-06-12 ENCOUNTER — Telehealth: Payer: Self-pay | Admitting: Family Medicine

## 2014-06-12 NOTE — Telephone Encounter (Signed)
Please call and schedule CPE with fasting labs prior for Dr. Bedsole.  

## 2014-06-13 NOTE — Telephone Encounter (Signed)
Left message asking pt to call office  °

## 2014-06-16 NOTE — Telephone Encounter (Signed)
Tried calling pt   No answer   voice mail full could not leave message

## 2014-06-22 NOTE — Telephone Encounter (Signed)
Spoke with pt.  Pt stated she would call back to schedule appointments Please close

## 2014-07-01 HISTORY — PX: BREAST LUMPECTOMY: SHX2

## 2014-10-05 ENCOUNTER — Other Ambulatory Visit: Payer: Self-pay | Admitting: Physician Assistant

## 2014-10-05 ENCOUNTER — Other Ambulatory Visit (HOSPITAL_COMMUNITY)
Admission: RE | Admit: 2014-10-05 | Discharge: 2014-10-05 | Disposition: A | Discharge status: Home / Self Care | Payer: Medicare Other | Source: Ambulatory Visit | Attending: Family Medicine | Admitting: Family Medicine

## 2014-10-05 DIAGNOSIS — Z01419 Encounter for gynecological examination (general) (routine) without abnormal findings: Secondary | ICD-10-CM | POA: Diagnosis present

## 2014-10-07 ENCOUNTER — Other Ambulatory Visit: Payer: Self-pay | Admitting: Physician Assistant

## 2014-10-07 ENCOUNTER — Other Ambulatory Visit: Payer: Self-pay

## 2014-10-07 DIAGNOSIS — Z803 Family history of malignant neoplasm of breast: Secondary | ICD-10-CM

## 2014-10-07 DIAGNOSIS — Z1231 Encounter for screening mammogram for malignant neoplasm of breast: Secondary | ICD-10-CM

## 2014-10-07 LAB — CYTOLOGY - PAP

## 2014-10-12 ENCOUNTER — Telehealth: Payer: Self-pay

## 2014-10-12 NOTE — Telephone Encounter (Signed)
Pt is going to have MRI done and request [facility] to send last mammogram done at our office; advised pt [facility] does not do mammograms here on site; pt said could have been [facility]; advised pt to contact [facility] at [phone]. Pt voiced understanding.

## 2014-10-18 ENCOUNTER — Ambulatory Visit: Payer: Self-pay | Admitting: Podiatry

## 2014-10-26 ENCOUNTER — Ambulatory Visit: Payer: Self-pay | Admitting: Podiatry

## 2014-12-30 DIAGNOSIS — C50919 Malignant neoplasm of unspecified site of unspecified female breast: Secondary | ICD-10-CM

## 2014-12-30 HISTORY — DX: Malignant neoplasm of unspecified site of unspecified female breast: C50.919

## 2015-01-05 ENCOUNTER — Ambulatory Visit
Admission: RE | Admit: 2015-01-05 | Discharge: 2015-01-05 | Disposition: A | Discharge status: Home / Self Care | Payer: Medicare Other | Source: Ambulatory Visit

## 2015-01-05 ENCOUNTER — Ambulatory Visit
Admission: RE | Admit: 2015-01-05 | Discharge: 2015-01-05 | Disposition: A | Discharge status: Home / Self Care | Payer: Medicare Other | Source: Ambulatory Visit | Attending: Physician Assistant | Admitting: Physician Assistant

## 2015-01-05 DIAGNOSIS — Z1231 Encounter for screening mammogram for malignant neoplasm of breast: Secondary | ICD-10-CM

## 2015-01-05 DIAGNOSIS — Z803 Family history of malignant neoplasm of breast: Secondary | ICD-10-CM

## 2015-01-05 MED ORDER — GADOBENATE DIMEGLUMINE 529 MG/ML IV SOLN
13.0000 mL | Freq: Once | INTRAVENOUS | Status: AC | PRN
  Administered 2015-01-05: 13 mL via INTRAVENOUS

## 2015-01-06 ENCOUNTER — Other Ambulatory Visit: Payer: Self-pay | Admitting: Physician Assistant

## 2015-01-06 DIAGNOSIS — R928 Other abnormal and inconclusive findings on diagnostic imaging of breast: Secondary | ICD-10-CM

## 2015-01-10 ENCOUNTER — Telehealth: Payer: Self-pay | Admitting: Family Medicine

## 2015-01-10 NOTE — Telephone Encounter (Signed)
Please call and schedule CPE with fasting labs prior with Dr. Bedsole. 

## 2015-01-10 NOTE — Telephone Encounter (Signed)
Called and spoke with pt to schedule CPE as directed by Dr. Diona Browner for Rx refill.  Pt informed that she has transferred back to Dr. [name] at [facility] in [place].  Changed PCP / lt

## 2015-01-10 NOTE — Telephone Encounter (Signed)
Noted  

## 2015-01-18 ENCOUNTER — Other Ambulatory Visit: Payer: Self-pay | Admitting: Physician Assistant

## 2015-01-18 ENCOUNTER — Ambulatory Visit
Admission: RE | Admit: 2015-01-18 | Discharge: 2015-01-18 | Disposition: A | Discharge status: Home / Self Care | Payer: Medicare Other | Source: Ambulatory Visit | Attending: Physician Assistant | Admitting: Physician Assistant

## 2015-01-18 DIAGNOSIS — R928 Other abnormal and inconclusive findings on diagnostic imaging of breast: Secondary | ICD-10-CM

## 2015-01-19 ENCOUNTER — Other Ambulatory Visit: Payer: Self-pay | Admitting: Physician Assistant

## 2015-01-19 ENCOUNTER — Ambulatory Visit
Admission: RE | Admit: 2015-01-19 | Discharge: 2015-01-19 | Disposition: A | Discharge status: Home / Self Care | Payer: Medicare Other | Source: Ambulatory Visit | Attending: Physician Assistant | Admitting: Physician Assistant

## 2015-01-19 DIAGNOSIS — N6310 Unspecified lump in the right breast, unspecified quadrant: Secondary | ICD-10-CM

## 2015-01-19 DIAGNOSIS — R928 Other abnormal and inconclusive findings on diagnostic imaging of breast: Secondary | ICD-10-CM

## 2015-01-25 ENCOUNTER — Other Ambulatory Visit: Payer: Self-pay | Admitting: Podiatry

## 2015-01-25 DIAGNOSIS — S90851A Superficial foreign body, right foot, initial encounter: Secondary | ICD-10-CM

## 2015-01-30 ENCOUNTER — Other Ambulatory Visit: Payer: Self-pay | Admitting: General Surgery

## 2015-01-30 DIAGNOSIS — C50511 Malignant neoplasm of lower-outer quadrant of right female breast: Secondary | ICD-10-CM

## 2015-01-31 ENCOUNTER — Ambulatory Visit
Admission: RE | Admit: 2015-01-31 | Discharge: 2015-01-31 | Disposition: A | Discharge status: Home / Self Care | Payer: Medicare Other | Source: Ambulatory Visit | Attending: Podiatry | Admitting: Podiatry

## 2015-01-31 DIAGNOSIS — S90851A Superficial foreign body, right foot, initial encounter: Secondary | ICD-10-CM

## 2015-01-31 MED ORDER — GADOBENATE DIMEGLUMINE 529 MG/ML IV SOLN
13.0000 mL | Freq: Once | INTRAVENOUS | Status: AC | PRN
  Administered 2015-01-31: 13 mL via INTRAVENOUS

## 2015-02-01 ENCOUNTER — Other Ambulatory Visit: Payer: Self-pay | Admitting: General Surgery

## 2015-02-01 DIAGNOSIS — C50511 Malignant neoplasm of lower-outer quadrant of right female breast: Secondary | ICD-10-CM

## 2015-02-02 ENCOUNTER — Telehealth: Payer: Self-pay | Admitting: *Deleted

## 2015-02-02 NOTE — Telephone Encounter (Signed)
Received referral from [facility].  Called pt and confirmed 02/10/15 appt w/ her.  Mailed before appt letter, calendar, welcoming packet & intake form to pt.  Emailed Nilda Simmer and [place] at [facility] to make them aware.  Placed a copy of the notes in Dr. Ernestina Penna box and took a copy to HIM to scan.  Everything else is in EP[facility].

## 2015-02-08 ENCOUNTER — Encounter (HOSPITAL_BASED_OUTPATIENT_CLINIC_OR_DEPARTMENT_OTHER): Payer: Self-pay | Admitting: *Deleted

## 2015-02-08 ENCOUNTER — Ambulatory Visit
Admission: RE | Admit: 2015-02-08 | Discharge: 2015-02-08 | Disposition: A | Discharge status: Home / Self Care | Payer: Medicare Other | Source: Ambulatory Visit | Attending: General Surgery | Admitting: General Surgery

## 2015-02-08 ENCOUNTER — Encounter (HOSPITAL_BASED_OUTPATIENT_CLINIC_OR_DEPARTMENT_OTHER)
Admission: RE | Admit: 2015-02-08 | Discharge: 2015-02-08 | Disposition: A | Discharge status: Home / Self Care | Payer: Medicare Other | Source: Ambulatory Visit | Attending: General Surgery | Admitting: General Surgery

## 2015-02-08 ENCOUNTER — Other Ambulatory Visit: Payer: Self-pay

## 2015-02-08 DIAGNOSIS — C773 Secondary and unspecified malignant neoplasm of axilla and upper limb lymph nodes: Secondary | ICD-10-CM | POA: Diagnosis not present

## 2015-02-08 DIAGNOSIS — C50511 Malignant neoplasm of lower-outer quadrant of right female breast: Secondary | ICD-10-CM

## 2015-02-08 DIAGNOSIS — I10 Essential (primary) hypertension: Secondary | ICD-10-CM | POA: Diagnosis not present

## 2015-02-08 DIAGNOSIS — C50911 Malignant neoplasm of unspecified site of right female breast: Secondary | ICD-10-CM | POA: Diagnosis present

## 2015-02-08 DIAGNOSIS — E780 Pure hypercholesterolemia: Secondary | ICD-10-CM | POA: Diagnosis not present

## 2015-02-08 DIAGNOSIS — Z79899 Other long term (current) drug therapy: Secondary | ICD-10-CM | POA: Diagnosis not present

## 2015-02-08 DIAGNOSIS — Z01818 Encounter for other preprocedural examination: Secondary | ICD-10-CM | POA: Insufficient documentation

## 2015-02-08 DIAGNOSIS — K219 Gastro-esophageal reflux disease without esophagitis: Secondary | ICD-10-CM | POA: Diagnosis not present

## 2015-02-08 DIAGNOSIS — F419 Anxiety disorder, unspecified: Secondary | ICD-10-CM | POA: Diagnosis not present

## 2015-02-08 DIAGNOSIS — Z87891 Personal history of nicotine dependence: Secondary | ICD-10-CM | POA: Diagnosis not present

## 2015-02-08 LAB — BASIC METABOLIC PANEL
Anion gap: 7 (ref 5–15)
BUN: 13 mg/dL (ref 6–20)
CHLORIDE: 101 mmol/L (ref 101–111)
CO2: 30 mmol/L (ref 22–32)
Calcium: 10 mg/dL (ref 8.9–10.3)
Creatinine, Ser: 0.91 mg/dL (ref 0.44–1.00)
GFR calc Af Amer: >60 mL/min (ref >60)
GFR calc non Af Amer: >60 mL/min (ref >60)
GLUCOSE: 74 mg/dL (ref 65–99)
POTASSIUM: 3.7 mmol/L (ref 3.5–5.1)
Sodium: 138 mmol/L (ref 135–145)

## 2015-02-09 ENCOUNTER — Telehealth: Payer: Self-pay | Admitting: *Deleted

## 2015-02-09 NOTE — Telephone Encounter (Signed)
Spoke with pt and informed pt of time changed for NP appt on 8/12.  Pt to come in at 1030 am for financial counseling and to see Dr. Burr Medico at 11 am.  Pt voiced understanding.

## 2015-02-10 ENCOUNTER — Ambulatory Visit (HOSPITAL_BASED_OUTPATIENT_CLINIC_OR_DEPARTMENT_OTHER): Payer: Medicare Other | Admitting: Hematology

## 2015-02-10 ENCOUNTER — Encounter: Payer: Self-pay | Admitting: Hematology

## 2015-02-10 ENCOUNTER — Ambulatory Visit: Payer: Medicare Other

## 2015-02-10 VITALS — BP 122/70 | HR 72 | Temp 97.8°F | Resp 18 | Ht 64.0 in | Wt 149.4 lb

## 2015-02-10 DIAGNOSIS — C50511 Malignant neoplasm of lower-outer quadrant of right female breast: Secondary | ICD-10-CM | POA: Diagnosis not present

## 2015-02-10 DIAGNOSIS — Z170 Estrogen receptor positive status [ER+]: Secondary | ICD-10-CM

## 2015-02-10 NOTE — Progress Notes (Signed)
[facility]  [phone] Fax:([phone]) [phone]  Clinic New Consult Note   Patient Care Team: [name], MD as PCP - General 02/10/2015  CHIEF COMPLAINTS/PURPOSE OF CONSULTATION:  Newly diagnosed right breast cancer  Referral physician: Dr. Marlou Starks   Oncology History   Breast cancer of lower-outer quadrant of right female breast   Staging form: Breast, AJCC 7th Edition     Clinical: Stage IA (T1b, N0, M0) - Unsigned       Breast cancer of lower-outer quadrant of right female breast   01/05/2015 Imaging A 10 mm diameter enhancing mass in the right lower outer breast, otherwise negative.   01/18/2015 Mammogram Diagnostic mammo and ultrasound showed a 0.6 cm mass in the right breast 8:00 location 6 cm from the nipple, and a 0.5 cm lesion in the 10:00 location of the right breast 6 cm from the nipple. Right axilla was negative for adenopathy.   01/19/2015 Initial Diagnosis Breast cancer of lower-outer quadrant of right female breast   01/19/2015 Initial Biopsy Right breast 8:00 mass biopsy showed invasive ductal carcinoma and DCIS, grade 1-2, the 10:00 mass biopsy showed fibrocystic change.   01/19/2015 Receptors her2 ER 100% positive, PR 90% positive, HER-2 negative, Ki-67 10%    HISTORY OF PRESENTING ILLNESS:  Shannon Hancock 64 y.o. female is here because of newly diagnosed right breast cancer. She presents to my clinic by herself.  She had normal mammograms in November 2015, due to her family history of breast cancer and dense breast tissue, she requested breast MRI but was denied by her insurance. She noticed some numbness and tingling in her right breast 3 weeks ago, no palpable mass, skin or nipple change, or other constitutional symptoms. She was seen by her primary care physician and had a breast MRI done on 01/05/2015. It showed a 10 mm enhancing mass in the right lower outer breast. She then underwent diagnostic mammogram and ultrasound on 01/18/2015  which showed a 0.6 centimeter mass in the right breast 8:00 position and a 0.5 cm lesion in the 10:00 position of the right breast.  both lesions were biopsied  and the first lesion showed invasive ductal carcinoma. The second lesion was benign changes.  she has been seen by breast surgeon Dr. Marcello Moores, and scheduled for right breast lumpectomy next Monday.  She has fibromyalgia, with chronic intermittent body aches and fatigue, no significant change daily. She has right heel pain for the last 3 days, better now, no other pain.  she denies any dyspnea, abdominal discomfort, change of her bowel habits, no recent weight loss.   She had right breast adenoma which was removed about 15 years ago.  Her sister and several cousins had a breast cancer. She is postmenopausal, occasionally uses vaginal estrogen, not on oral hormonal replacement.   MEDICAL HISTORY:  Past Medical History  Diagnosis Date  . Allergy   . Hypertension   . Fibromyalgia   . Diverticulosis   . Heart murmur   . Anxiety     SURGICAL HISTORY: Past Surgical History  Procedure Laterality Date  . Tubal ligation    . Foot surgery     GYN HISTORY  Menarchal: 13 LMP: 54 Contraceptive: a few years HRT: she has been on estrogen vaginal cream occationally  G2P2: breast feeing for 4 month for second child    SOCIAL HISTORY: Social History   Social History  . Marital Status: Married    Spouse Name: N/A  . Number of Children:  2 daughters, age of 71 and 20   . Years of Education: N/A   Occupational History  . 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 Smoking status: Former Research scientist (life sciences)  . Smokeless tobacco: Never Used  . Alcohol Use: No  . Drug Use: No  . Sexual Activity: Not on file   Other Topics Concern  . Not on file   Social History Narrative   Regular exercise-no   Living will: None (reviewed 2014)    FAMILY HISTORY: Family History  Problem Relation Age of Onset  . Alzheimer's disease  Mother   . Stroke Mother   . Coronary artery disease Father   . Hypertension Father   . Depression Father   . Cancer Father     prostate  . Cancer Sister     breast  . Cancer Brother     prostate  . Hypertension Brother   . Hyperlipidemia Brother     ALLERGIES:  is allergic to olive oil.  MEDICATIONS:  Current Outpatient Prescriptions  Medication Sig Dispense Refill  . ALPRAZolam (XANAX) 1 MG tablet TAKE 1 TABLET BY MOUTH 4 TIMES A DAY AS NEEDED FOR SLEEP OR ANXIETY 120 tablet 0  . amLODipine (NORVASC) 5 MG tablet Take 5 mg by mouth daily.    . fexofenadine (ALLEGRA) 180 MG tablet Take 180 mg by mouth daily.    Marland Kitchen losartan-hydrochlorothiazide (HYZAAR) 100-12.5 MG per tablet TAKE 1 TABLET BY MOUTH DAILY. 30 tablet 0  . LYRICA 100 MG capsule TAKE ONE CAPSULE TWICE A DAY 60 capsule 3  . meloxicam (MOBIC) 15 MG tablet Take by mouth.    . psyllium (METAMUCIL) 58.6 % powder Take 1 packet by mouth daily.    . tizanidine (ZANAFLEX) 2 MG capsule Take 2 mg by mouth at bedtime as needed for muscle spasms.    Marland Kitchen ZETIA 10 MG tablet TAKE 1 TABLET BY MOUTH DAILY. 90 tablet 1  . conjugated estrogens (PREMARIN) vaginal cream Place 1 Applicatorful vaginally daily. (Patient not taking: Reported on 02/10/2015) 30 g 0  . omeprazole (PRILOSEC) 20 MG capsule Take 20 mg by mouth daily.    . VESICARE 10 MG tablet TAKE 1 TABLET (10 MG TOTAL) BY MOUTH DAILY. (Patient not taking: Reported on 02/10/2015) 84 tablet 1   No current facility-administered medications for this visit.    REVIEW OF SYSTEMS:   Constitutional: Denies fevers, chills or abnormal night sweats Eyes: Denies blurriness of vision, double vision or watery eyes Ears, nose, mouth, throat, and face: Denies mucositis or sore throat Respiratory: Denies cough, dyspnea or wheezes Cardiovascular: Denies palpitation, chest discomfort or lower extremity swelling Gastrointestinal:  Denies nausea, heartburn or change in bowel habits Skin: Denies  abnormal skin rashes Lymphatics: Denies new lymphadenopathy or easy bruising Neurological:Denies numbness, tingling or new weaknesses Behavioral/Psych: Mood is stable, no new changes  All other systems were reviewed with the patient and are negative.  PHYSICAL EXAMINATION: ECOG PERFORMANCE STATUS: 1 - Symptomatic but completely ambulatory  Filed Vitals:   02/10/15 1116  BP: 122/70  Pulse: 72  Temp: 97.8 F (36.6 C)  Resp: 18   Filed Weights   02/10/15 1116  Weight: 149 lb 6.4 oz (67.767 kg)    GENERAL:alert, no distress and comfortable SKIN: skin color, texture, turgor are normal, no rashes or significant lesions EYES: normal, conjunctiva are pink and non-injected, sclera clear OROPHARYNX:no exudate, no erythema and lips, buccal mucosa, and tongue normal  NECK: supple, thyroid normal size, non-tender, without nodularity LYMPH:  no palpable lymphadenopathy in the cervical, axillary or inguinal LUNGS: clear to auscultation and percussion with normal breathing effort HEART: regular rate & rhythm and no murmurs and no lower extremity edema ABDOMEN:abdomen soft, non-tender and normal bowel sounds Musculoskeletal:no cyanosis of digits and no clubbing  PSYCH: alert & oriented x 3 with fluent speech NEURO: no focal motor/sensory deficits Breasts: Breast inspection showed them to be symmetrical with no nipple discharge. Palpation of the breasts and axilla revealed no obvious mass that I could appreciate.   LABORATORY DATA:  I have reviewed the data as listed Lab Results  Component Value Date   WBC 6.1 03/16/2013   HGB 11.6* 03/16/2013   HCT 34.9* 03/16/2013   MCV 80.8 03/16/2013   PLT 207.0 03/16/2013    Recent Labs  02/08/15 1156  NA 138  K 3.7  CL 101  CO2 30  GLUCOSE 74  BUN 13  CREATININE 0.91  CALCIUM 10.0  GFRNONAA >60  GFRAA >60   PATHOLOGY REPORT Diagnosis 01/19/2015 1. Breast, right, needle core biopsy, 10 o'clock - FIBROCYSTIC CHANGES. - NO MALIGNANCY  IDENTIFIED. 2. Breast, right, needle core biopsy, 8 o'clock - INVASIVE DUCTAL CARCINOMA. - DUCTAL CARCINOMA IN SITU. Microscopic Comment 2. The findings are consistent with grade 1/2 ductal carcinoma. A breast prognostic profile will be performed.  2. PROGNOSTIC INDICATORS - ACIS Results: IMMUNOHISTOCHEMICAL AND MORPHOMETRIC ANALYSIS BY THE AUTOMATED CELLULAR IMAGING SYSTEM (ACIS) Estrogen Receptor: 100%, POSITIVE, STRONG STAINING INTENSITY (PERFORMED MANUALLY) Progesterone Receptor: 90%, POSITIVE, STRONG STAINING INTENSITY (PERFORMED MANUALLY) Proliferation Marker Ki67: 10% (PERFORMED MANUALLY)  2. FLUORESCENCE IN-SITU HYBRIDIZATION Results: HER2 - NEGATIVE RATIO OF HER2/CEP17 SIGNALS 1.24 AVERAGE HER2 COPY NUMBER PER CELL 2.05     RADIOGRAPHIC STUDIES: I have personally reviewed the radiological images as listed and agreed with the findings in the report. Mr Foot Right W Wo Contrast  Mm Digital Diagnostic Unilat R  01/19/2015   CLINICAL DATA:  Status post ultrasound-guided core biopsy of mass in the 10 o'clock location and 8 o'clock location of the right breast.  EXAM: DIAGNOSTIC RIGHT MAMMOGRAM POST ULTRASOUND BIOPSY x2  COMPARISON:  Previous exam(s).  FINDINGS: Mammographic images were obtained following ultrasound guided biopsy of a mass in the 8 o'clock and 10 o'clock locations of right breast. A coil shaped clip is identified in the upper-outer quadrant of the right breast. A ribbon shaped clip is identified in lower outer quadrant of the right breast. The clips are approximately 6.0 cm apart.  IMPRESSION: The tissue marker clips are in expected locations following biopsy.  Final Assessment: Post Procedure Mammograms for Marker Placement   Electronically Signed   By: Nolon Nations M.D.   On: 01/19/2015 13:28   MM and US Breast Ltd Uni Right Inc Axilla 01/18/2015    FINDINGS: Additional views are performed confirming presence of a spiculated mass in the lower outer quadrant of  the right breast, posteriorly located within the breast.  Mammographic images were processed with CAD.  On physical exam, I palpate no abnormality in the lower outer quadrant of the right breast.  Targeted ultrasound is performed, showing a solid mass in the 8 o'clock location 6 cm from the nipple which measures 0.5 x 0.6 x 0.5 cm. Mass is vascular on Doppler evaluation. This mass correlates well with the MRI and mammogram findings. In the 10 o'clock location of the right breast 6 cm from the nipple there is an anechoic lesion with internal  septation measuring 0.5 x 0.5 x 0.6 cm. There is no clear MRI correlate for this probable cyst. Evaluation of the right axilla is negative for adenopathy.  IMPRESSION: 1. Suspicious mass in the 8 o'clock location of the right breast. Ultrasound-guided core biopsy is recommended. 2. Probable cyst in the 10 o'clock location of the right breast. Ultrasound-guided cyst aspiration is recommended in light of the lack of MRI correlation. 3. No evidence for adenopathy.  RECOMMENDATION: 1. Ultrasound-guided core biopsy mass right breast 8 o'clock. 2. Ultrasound-guided cyst aspiration right breast 10 o'clock. 3. Patient will return on 01/19/2015 for procedures.  I have discussed the findings and recommendations with the patient. Results were also provided in writing at the conclusion of the visit. If applicable, a reminder letter will be sent to the patient regarding the next appointment.  BI-RADS CATEGORY  4: Suspicious.   Electronically Signed   By: Nolon Nations M.D.   On: 01/18/2015 12:06   [place] Rt Breast Bx W Loc Dev Ea Add Lesion Img Bx Spec US Guide  01/23/2015   ADDENDUM REPORT: 01/23/2015 07:46  ADDENDUM: Pathology revealed fibrocystic changes in the right breast at 10:00 and grade I to II invasive ductal carcinoma and ductal carcinoma in situ in the right breast at 8:00. This was found to be concordant by Dr. Shon Hale. Pathology was discussed with the patient when she  arrived at [facility]. Her biopsy site was without bruising, redness, hematoma, or tenderness. Post biopsy instructions and care were reviewed and her questions were answered. At her request, surgical consultation was made in [place]. Her appointment is with Dr. Autumn Messing at [facility] on January 23, 2015. She was given Scientist, clinical (histocompatibility and immunogenetics). My number was provided for additional concerns and questions.  Pathology results reported by Susa Raring RN, BSN on January 23, 2015.   Electronically Signed   By: Nolon Nations M.D.   On: 01/23/2015 07:46   01/23/2015   CLINICAL DATA:  Patient presents for ultrasound-guided core biopsy of right breast mass 8 o'clock location. Additionally a probable cyst in the 10 o'clock location was identified sonographically without definite MRI correlate. Therefore cyst aspiration and possible core biopsy is recommended.  EXAM: ULTRASOUND GUIDED RIGHT BREAST CORE NEEDLE BIOPSY x2  COMPARISON:  Previous exam(s).  FINDINGS: I met with the patient and we discussed the procedure of ultrasound-guided biopsy, including benefits and alternatives. We discussed the high likelihood of a successful procedure. We discussed the risks of the procedure, including infection, bleeding, tissue injury, clip migration, and inadequate sampling. Informed written consent was given. The usual time-out protocol was performed immediately prior to the procedure.  Lesion 1:  10 o'clock right breast:  Initially ultrasound guidance was used to attempt cyst aspiration of the lesion in the 10 o'clock location of the right breast. Using 1% lidocaine for local anesthetic, 18 gauge spinal needle was placed into the anechoic lesion in the 10 o'clock location of the right breast. However no fluid could be aspirated from the lesion. Therefore core biopsy was performed. Using sterile technique and 2% Lidocaine as local anesthetic, under direct ultrasound visualization, a  14 gauge spring-loaded device was used to perform biopsy of mass in the 10 o'clock location of the right breast using a caudal approach. At the conclusion of the procedure a coil shaped tissue marker clip was deployed into the biopsy cavity.  Lesion 2:  8 o'clock right breast:  Using sterile technique and 2% Lidocaine as  local anesthetic, under direct ultrasound visualization, a 14 gauge spring-loaded device was used to perform biopsy of mass in the 8 o'clock location of the right breast using a caudal approach. At the conclusion of the procedure a ribbon shaped tissue marker clip was deployed into the biopsy cavity.  Follow up 2 view mammogram was performed and dictated separately.  IMPRESSION: Ultrasound guided biopsy of masses in the 10 o'clock and 8 o'clock locations of the right breast. No apparent complications.  Electronically Signed: By: Nolon Nations M.D. On: 01/19/2015 14:49   Bilateral breast  MRI w wo contrast 01/05/2015 IMPRESSION: 1. A 10 mm diameter enhancing mass in the lower outer right breast is suspicious. 2. No MR findings to suggest malignancy in the left breast. 3. No evidence of adenopathy.   ASSESSMENT & PLAN:  64 year old female, postmenopausal, who was recently diagnosed with right breast cancer   1. Right breast invasive ductal carcinoma, cT1bN0M0, stage IA, ER+/PR+/HER2-, Ki67 10% -I reviewed her breast imaging findings and the biopsy results. -She has very early stage breast cancer, strongly ER/PR positive, low Ki-67, likely will do very well. -She is scheduled to have a right breast lumpectomy and sentinel lymph node biopsy next Monday by Dr. Marlou Starks -We discussed the risk of cancer recurrence after complete surgical resection, which really depends on the stage and tumor biology. Given her early stage and above molecular features of her tumor, her risk of cancer recurrence is probably low. -Giving the strong ER/PR positive tumor, I would recommend adjuvant aromatase  inhibitor to reduce the risk of cancer recurrence -I discussed the role of adjuvant chemotherapy. Depends on her final surgical pathology results, I will probably ordered Oncotype DX test to determine the risk of cancer recurrence and benefit of chemotherapy. If she has positive lymph nodes, I would offer adjuvant chemotherapy also. This was explained to her in the details, she voiced good understanding. -She would also benefit from adjuvant breast irradiation after lumpectomy. She will see radiation oncology after surgery -She was quite overwhelmed by the information she received today, I suggest her bring her husband or a friend on her next visit, and we will review these information again.  Plan -Right breast lumpectomy and sentinel lymph node biopsy next Monday -I'll review her surgical past, and determine if the Oncotype DX test is needed -I'll see her back in 3 weeks after surgery.  All questions were answered. The patient knows to call the clinic with any problems, questions or concerns. I spent 55 minutes counseling the patient face to face. The total time spent in the appointment was 60 minutes and more than 50% was on counseling.     Truitt Merle, MD 02/10/2015 11:54 AM

## 2015-02-10 NOTE — Progress Notes (Signed)
Checked in new pt with no financial concerns prior to seeing the pt.  Pt has my card for any billing questions, concerns or if financial assistance is needed.

## 2015-02-13 ENCOUNTER — Ambulatory Visit (HOSPITAL_BASED_OUTPATIENT_CLINIC_OR_DEPARTMENT_OTHER): Payer: Medicare Other | Admitting: Anesthesiology

## 2015-02-13 ENCOUNTER — Encounter: Payer: Self-pay | Admitting: *Deleted

## 2015-02-13 ENCOUNTER — Ambulatory Visit (HOSPITAL_COMMUNITY)
Admission: RE | Admit: 2015-02-13 | Discharge: 2015-02-13 | Disposition: A | Discharge status: Home / Self Care | Payer: Medicare Other | Source: Ambulatory Visit | Attending: General Surgery | Admitting: General Surgery

## 2015-02-13 ENCOUNTER — Encounter (HOSPITAL_BASED_OUTPATIENT_CLINIC_OR_DEPARTMENT_OTHER)
Admission: RE | Disposition: A | Discharge status: Home / Self Care | Payer: Self-pay | Source: Ambulatory Visit | Attending: General Surgery

## 2015-02-13 ENCOUNTER — Encounter (HOSPITAL_BASED_OUTPATIENT_CLINIC_OR_DEPARTMENT_OTHER): Payer: Self-pay | Admitting: *Deleted

## 2015-02-13 ENCOUNTER — Ambulatory Visit (HOSPITAL_BASED_OUTPATIENT_CLINIC_OR_DEPARTMENT_OTHER)
Admission: RE | Admit: 2015-02-13 | Discharge: 2015-02-13 | Disposition: A | Discharge status: Home / Self Care | Payer: Medicare Other | Source: Ambulatory Visit | Attending: General Surgery | Admitting: General Surgery

## 2015-02-13 ENCOUNTER — Ambulatory Visit
Admission: RE | Admit: 2015-02-13 | Discharge: 2015-02-13 | Disposition: A | Discharge status: Home / Self Care | Payer: Medicare Other | Source: Ambulatory Visit | Attending: General Surgery | Admitting: General Surgery

## 2015-02-13 DIAGNOSIS — C50511 Malignant neoplasm of lower-outer quadrant of right female breast: Secondary | ICD-10-CM

## 2015-02-13 DIAGNOSIS — Z79899 Other long term (current) drug therapy: Secondary | ICD-10-CM | POA: Insufficient documentation

## 2015-02-13 DIAGNOSIS — I10 Essential (primary) hypertension: Secondary | ICD-10-CM | POA: Diagnosis not present

## 2015-02-13 DIAGNOSIS — F419 Anxiety disorder, unspecified: Secondary | ICD-10-CM | POA: Insufficient documentation

## 2015-02-13 DIAGNOSIS — Z87891 Personal history of nicotine dependence: Secondary | ICD-10-CM | POA: Insufficient documentation

## 2015-02-13 DIAGNOSIS — K219 Gastro-esophageal reflux disease without esophagitis: Secondary | ICD-10-CM | POA: Diagnosis not present

## 2015-02-13 DIAGNOSIS — C773 Secondary and unspecified malignant neoplasm of axilla and upper limb lymph nodes: Secondary | ICD-10-CM | POA: Insufficient documentation

## 2015-02-13 DIAGNOSIS — E780 Pure hypercholesterolemia: Secondary | ICD-10-CM | POA: Insufficient documentation

## 2015-02-13 HISTORY — DX: Anxiety disorder, unspecified: F41.9

## 2015-02-13 HISTORY — PX: BREAST LUMPECTOMY WITH RADIOACTIVE SEED AND SENTINEL LYMPH NODE BIOPSY: SHX6550

## 2015-02-13 SURGERY — BREAST LUMPECTOMY WITH RADIOACTIVE SEED AND SENTINEL LYMPH NODE BIOPSY
Anesthesia: General | Site: Breast | Laterality: Right

## 2015-02-13 MED ORDER — OXYCODONE-ACETAMINOPHEN 5-325 MG PO TABS: 1.0000 | ORAL_TABLET | ORAL | Status: DC | PRN

## 2015-02-13 MED ORDER — FENTANYL CITRATE (PF) 100 MCG/2ML IJ SOLN
INTRAMUSCULAR | Status: AC
  Filled 2015-02-13: qty 6

## 2015-02-13 MED ORDER — FENTANYL CITRATE (PF) 100 MCG/2ML IJ SOLN: 25.0000 ug | INTRAMUSCULAR | Status: DC | PRN

## 2015-02-13 MED ORDER — PROPOFOL 10 MG/ML IV BOLUS
INTRAVENOUS | Status: DC | PRN
  Administered 2015-02-13: 170 mg via INTRAVENOUS

## 2015-02-13 MED ORDER — MIDAZOLAM HCL 2 MG/2ML IJ SOLN
INTRAMUSCULAR | Status: AC
  Filled 2015-02-13: qty 2

## 2015-02-13 MED ORDER — MIDAZOLAM HCL 2 MG/ML PO SYRP: 0.5000 mg/kg | ORAL_SOLUTION | Freq: Once | ORAL | Status: DC

## 2015-02-13 MED ORDER — OXYCODONE-ACETAMINOPHEN 5-325 MG PO TABS
1.0000 | ORAL_TABLET | Freq: Once | ORAL | Status: AC | PRN
  Administered 2015-02-13: 1 via ORAL

## 2015-02-13 MED ORDER — LACTATED RINGERS IV SOLN
INTRAVENOUS | Status: DC
  Administered 2015-02-13 (×2): via INTRAVENOUS

## 2015-02-13 MED ORDER — FENTANYL CITRATE (PF) 100 MCG/2ML IJ SOLN
INTRAMUSCULAR | Status: AC
  Filled 2015-02-13: qty 2

## 2015-02-13 MED ORDER — SUCCINYLCHOLINE CHLORIDE 20 MG/ML IJ SOLN
INTRAMUSCULAR | Status: DC | PRN
  Administered 2015-02-13: 120 mg via INTRAVENOUS

## 2015-02-13 MED ORDER — BUPIVACAINE HCL (PF) 0.25 % IJ SOLN
INTRAMUSCULAR | Status: DC | PRN
  Administered 2015-02-13: 30 mL

## 2015-02-13 MED ORDER — DEXAMETHASONE SODIUM PHOSPHATE 4 MG/ML IJ SOLN
INTRAMUSCULAR | Status: DC | PRN
  Administered 2015-02-13: 10 mg via INTRAVENOUS

## 2015-02-13 MED ORDER — FENTANYL CITRATE (PF) 100 MCG/2ML IJ SOLN
50.0000 ug | INTRAMUSCULAR | Status: AC | PRN
Start: 2015-02-13 — End: 2015-02-13
  Administered 2015-02-13 (×2): 100 ug via INTRAVENOUS
  Administered 2015-02-13 (×2): 50 ug via INTRAVENOUS

## 2015-02-13 MED ORDER — ONDANSETRON HCL 4 MG/2ML IJ SOLN
INTRAMUSCULAR | Status: DC | PRN
  Administered 2015-02-13: 4 mg via INTRAVENOUS

## 2015-02-13 MED ORDER — EPHEDRINE SULFATE 50 MG/ML IJ SOLN
INTRAMUSCULAR | Status: DC | PRN
  Administered 2015-02-13: 10 mg via INTRAVENOUS

## 2015-02-13 MED ORDER — CHLORHEXIDINE GLUCONATE 4 % EX LIQD: 1.0000 "application " | Freq: Once | CUTANEOUS | Status: DC

## 2015-02-13 MED ORDER — LACTATED RINGERS IV SOLN: 500.0000 mL | INTRAVENOUS | Status: DC

## 2015-02-13 MED ORDER — METHYLENE BLUE 1 % INJ SOLN
INTRAMUSCULAR | Status: AC
  Filled 2015-02-13: qty 10

## 2015-02-13 MED ORDER — CEFAZOLIN SODIUM-DEXTROSE 2-3 GM-% IV SOLR
2.0000 g | INTRAVENOUS | Status: AC
Start: 2015-02-14 — End: 2015-02-13
  Administered 2015-02-13: 2 g via INTRAVENOUS

## 2015-02-13 MED ORDER — CHLORHEXIDINE GLUCONATE 4 % EX LIQD
1.0000 | Freq: Once | CUTANEOUS | Status: DC
Start: 2015-02-14 — End: 2015-02-13

## 2015-02-13 MED ORDER — SODIUM CHLORIDE 0.9 % IJ SOLN
INTRAMUSCULAR | Status: AC
  Filled 2015-02-13: qty 10

## 2015-02-13 MED ORDER — SCOPOLAMINE 1 MG/3DAYS TD PT72: 1.0000 | MEDICATED_PATCH | Freq: Once | TRANSDERMAL | Status: DC | PRN

## 2015-02-13 MED ORDER — GLYCOPYRROLATE 0.2 MG/ML IJ SOLN: 0.2000 mg | Freq: Once | INTRAMUSCULAR | Status: DC | PRN

## 2015-02-13 MED ORDER — 0.9 % SODIUM CHLORIDE (POUR BTL) OPTIME
TOPICAL | Status: DC | PRN
  Administered 2015-02-13: 300 mL

## 2015-02-13 MED ORDER — LIDOCAINE HCL (CARDIAC) 20 MG/ML IV SOLN
INTRAVENOUS | Status: DC | PRN
  Administered 2015-02-13: 40 mg via INTRAVENOUS

## 2015-02-13 MED ORDER — OXYCODONE-ACETAMINOPHEN 5-325 MG PO TABS
ORAL_TABLET | ORAL | Status: AC
  Filled 2015-02-13: qty 1

## 2015-02-13 MED ORDER — MIDAZOLAM HCL 2 MG/2ML IJ SOLN
1.0000 mg | INTRAMUSCULAR | Status: DC | PRN
Start: 2015-02-13 — End: 2015-02-13
  Administered 2015-02-13: 1 mg via INTRAVENOUS

## 2015-02-13 MED ORDER — TECHNETIUM TC 99M SULFUR COLLOID FILTERED
1.0000 | Freq: Once | INTRAVENOUS | Status: AC | PRN
  Administered 2015-02-13: 1 via INTRADERMAL

## 2015-02-13 MED ORDER — ONDANSETRON HCL 4 MG/2ML IJ SOLN: 4.0000 mg | Freq: Once | INTRAMUSCULAR | Status: DC | PRN

## 2015-02-13 SURGICAL SUPPLY — 45 items
APPLIER CLIP 9.375 MED OPEN (MISCELLANEOUS) ×3
APR CLP MED 9.3 20 MLT OPN (MISCELLANEOUS) ×1
BLADE SURG 15 STRL LF DISP TIS (BLADE) ×1 IMPLANT
BLADE SURG 15 STRL SS (BLADE) ×3
CANISTER SUC SOCK COL 7IN (MISCELLANEOUS) IMPLANT
CANISTER SUCT 1200ML W/VALVE (MISCELLANEOUS) ×2 IMPLANT
CHLORAPREP W/TINT 26ML (MISCELLANEOUS) ×3 IMPLANT
CLIP APPLIE 9.375 MED OPEN (MISCELLANEOUS) ×1 IMPLANT
COVER BACK TABLE 60X90IN (DRAPES) ×3 IMPLANT
COVER MAYO STAND STRL (DRAPES) ×3 IMPLANT
COVER PROBE W GEL 5X96 (DRAPES) ×3 IMPLANT
DECANTER SPIKE VIAL GLASS SM (MISCELLANEOUS) IMPLANT
DEVICE DUBIN W/COMP PLATE 8390 (MISCELLANEOUS) ×3 IMPLANT
DRAPE LAPAROSCOPIC ABDOMINAL (DRAPES) ×3 IMPLANT
DRAPE UTILITY XL STRL (DRAPES) ×3 IMPLANT
ELECT COATED BLADE 2.86 ST (ELECTRODE) ×3 IMPLANT
ELECT REM PT RETURN 9FT ADLT (ELECTROSURGICAL) ×3
ELECTRODE REM PT RTRN 9FT ADLT (ELECTROSURGICAL) ×1 IMPLANT
GLOVE BIO SURGEON STRL SZ7.5 (GLOVE) ×3 IMPLANT
GLOVE BIOGEL M STRL SZ7.5 (GLOVE) ×2 IMPLANT
GLOVE BIOGEL PI IND STRL 8 (GLOVE) IMPLANT
GLOVE BIOGEL PI INDICATOR 8 (GLOVE) ×2
GLOVE EXAM NITRILE MD LF STRL (GLOVE) ×2 IMPLANT
GOWN STRL REUS W/ TWL LRG LVL3 (GOWN DISPOSABLE) ×2 IMPLANT
GOWN STRL REUS W/TWL LRG LVL3 (GOWN DISPOSABLE)
KIT MARKER MARGIN INK (KITS) ×3 IMPLANT
LIQUID BAND (GAUZE/BANDAGES/DRESSINGS) ×3 IMPLANT
NDL HYPO 25X1 1.5 SAFETY (NEEDLE) ×1 IMPLANT
NDL SAFETY ECLIPSE 18X1.5 (NEEDLE) IMPLANT
NEEDLE HYPO 18GX1.5 SHARP (NEEDLE) ×3
NEEDLE HYPO 25X1 1.5 SAFETY (NEEDLE) ×3 IMPLANT
NS IRRIG 1000ML POUR BTL (IV SOLUTION) IMPLANT
PACK BASIN DAY SURGERY FS (CUSTOM PROCEDURE TRAY) ×3 IMPLANT
PENCIL BUTTON HOLSTER BLD 10FT (ELECTRODE) ×3 IMPLANT
SLEEVE SCD COMPRESS KNEE MED (MISCELLANEOUS) ×3 IMPLANT
SPONGE LAP 18X18 X RAY DECT (DISPOSABLE) ×3 IMPLANT
SUT MON AB 4-0 PC3 18 (SUTURE) ×6 IMPLANT
SUT SILK 2 0 SH (SUTURE) IMPLANT
SUT VICRYL 3-0 CR8 SH (SUTURE) ×3 IMPLANT
SYR CONTROL 10ML LL (SYRINGE) ×5 IMPLANT
TOWEL OR 17X24 6PK STRL BLUE (TOWEL DISPOSABLE) ×3 IMPLANT
TOWEL OR NON WOVEN STRL DISP B (DISPOSABLE) ×3 IMPLANT
TUBE CONNECTING 20'X1/4 (TUBING) ×1
TUBE CONNECTING 20X1/4 (TUBING) ×1 IMPLANT
YANKAUER SUCT BULB TIP NO VENT (SUCTIONS) ×2 IMPLANT

## 2015-02-13 NOTE — Anesthesia Preprocedure Evaluation (Signed)
Anesthesia Evaluation  Patient identified by MRN, date of birth, ID band Patient awake    Reviewed: Allergy & Precautions, NPO status , Patient's Chart, lab work & pertinent test results  Airway Mallampati: II  TM Distance: >3 FB Neck ROM: Full    Dental  (+) Teeth Intact, Dental Advisory Given   Pulmonary former smoker,  breath sounds clear to auscultation        Cardiovascular hypertension, Rhythm:Regular Rate:Normal     Neuro/Psych    GI/Hepatic   Endo/Other    Renal/GU      Musculoskeletal   Abdominal   Peds  Hematology   Anesthesia Other Findings   Reproductive/Obstetrics                             Anesthesia Physical Anesthesia Plan  ASA: II  Anesthesia Plan: General   Post-op Pain Management:    Induction: Intravenous  Airway Management Planned: Oral ETT  Additional Equipment:   Intra-op Plan:   Post-operative Plan: Extubation in OR  Informed Consent:   Dental advisory given  Plan Discussed with: CRNA and Anesthesiologist  Anesthesia Plan Comments:         Anesthesia Quick Evaluation

## 2015-02-13 NOTE — Discharge Instructions (Signed)

## 2015-02-13 NOTE — Interval H&P Note (Signed)
History and Physical Interval Note:  02/13/2015 2:38 PM  Shannon Hancock  has presented today for surgery, with the diagnosis of Right Breast Cancer  The various methods of treatment have been discussed with the patient and family. After consideration of risks, benefits and other options for treatment, the patient has consented to  Procedure(s): RIGHT BREAST LUMPECTOMY WITH RADIOACTIVE SEED AND SENTINEL LYMPH NODE MAPPING (Right) as a surgical intervention .  The patient's history has been reviewed, patient examined, no change in status, stable for surgery.  I have reviewed the patient's chart and labs.  Questions were answered to the patient's satisfaction.     TOTH III,PAUL S

## 2015-02-13 NOTE — Anesthesia Postprocedure Evaluation (Signed)
  Anesthesia Post-op Note  Patient: Shannon Hancock  Procedure(s) Performed: Procedure(s): RIGHT BREAST LUMPECTOMY WITH RADIOACTIVE SEED AND SENTINEL LYMPH NODE MAPPING (Right)  Patient Location: PACU  Anesthesia Type:General  Level of Consciousness: awake, alert  and oriented  Airway and Oxygen Therapy: Patient Spontanous Breathing and Patient connected to nasal cannula oxygen  Post-op Pain: mild  Post-op Assessment: Post-op Vital signs reviewed, Patient's Cardiovascular Status Stable, Respiratory Function Stable, Patent Airway and Pain level controlled              Post-op Vital Signs: stable  Last Vitals:  Filed Vitals:   02/13/15 1700  BP: 122/81  Pulse: 85  Temp:   Resp: 15    Complications: No apparent anesthesia complications

## 2015-02-13 NOTE — Op Note (Signed)
02/13/2015  4:38 PM  PATIENT:  Shannon Hancock  64 y.o. female  PRE-OPERATIVE DIAGNOSIS:  Right Breast Cancer  POST-OPERATIVE DIAGNOSIS:  Right Breast Cancer  PROCEDURE:  Procedure(s): RIGHT BREAST LUMPECTOMY WITH RADIOACTIVE SEED AND SENTINEL LYMPH NODE MAPPING (Right)  SURGEON:  Surgeon(s) and Role:    * [name], MD - Primary  PHYSICIAN ASSISTANT:   ASSISTANTS: none   ANESTHESIA:   general  EBL:  Total I/O In: 1500 [I.V.:1500] Out: -   BLOOD ADMINISTERED:none  DRAINS: none   LOCAL MEDICATIONS USED:  MARCAINE     SPECIMEN:  Source of Specimen:  right breast tissue and sentinel node  DISPOSITION OF SPECIMEN:  PATHOLOGY  COUNTS:  YES  TOURNIQUET:  * No tourniquets in log *  DICTATION: .Dragon Dictation  After informed consent was obtained the patient was brought to the operating room and placed in the supine position on the operating room table. After adequate induction of general anesthesia the patient's right chest, breast, and axillary area were prepped with ChloraPrep, allowed to dry, and draped in usual sterile manner. Previously an I-125 radioactive seed was placed in the lower outer quadrant of the right breast to mark the area of breast cancer. Earlier in the day the patient also underwent injection of 1 mCi of technetium sulfur colloid in the subareolar position on the right. The neoprobe was sent to technetium in the right axilla was examined. An area of increased radioactivity was found. A small transversely oriented incision was made overlying the area of radioactivity with a 15 blade knife. The incision was carried through the skin and subcutaneous tissue sharply with the electrocautery until the axilla was entered. Using the neoprobe to direct blunt hemostat dissection a hot lymph node was identified. The lymph node was excised sharply with the electrocautery and the lymphatics were controlled with clips. Ex vivo counts on this sentinel mode #1 were  approximately 300. This was sent to pathology for further evaluation. No other hot or palpable lymph nodes were identified in the right axilla. The wound was infiltrated with quarter percent Marcaine. The deep layer of the wound was closed with interrupted 3-0 Vicryl stitches. Skin was then closed with a running 4-0 Monocryl subcuticular stitch. Attention was then turned to the right breast. The neoprobe was set to I-125. The area of radioactivity was identified in the lower outer quadrant. An elliptical incision was made overlying the area of radioactivity with a 15 blade knife. This incision was carried through the skin and subcutaneous tissue sharply with the electrocautery. While checking the area of radioactivity frequently with the neoprobe a circular portion of breast tissue was excised sharply around the radioactive seed. A section was carried all the way to the chest wall. Once the specimen was removed it was oriented with the appropriate paint colors. There was I-125 radioactivity in the specimen. There was no residual I-125 radioactivity in the breast. A specimen radiograph was obtained that showed the clip in seed to be in the center of the specimen. Specimen was then sent to pathology for further evaluation. Hemostasis was achieved using the Bovie electrocautery. The wound was infiltrated with quarter percent Marcaine and irrigated with saline. The cavity was marked with clips. The deep layer of the wound was closed with layers of interrupted 3-0 Vicryl stitches. The skin was then closed with interrupted 4-0 Monocryl subcuticular stitches. Dermabond dressings were applied. The patient tolerated the procedure well. At the end of the case all needle sponge and  instrument counts were correct. The patient was then awakened and taken to recovery in stable condition.  PLAN OF CARE: Discharge to home after PACU  PATIENT DISPOSITION:  PACU - hemodynamically stable.   Delay start of Pharmacological VTE  agent (>24hrs) due to surgical blood loss or risk of bleeding: not applicable

## 2015-02-13 NOTE — Transfer of Care (Signed)
Immediate Anesthesia Transfer of Care Note  Patient: Shannon Hancock  Procedure(s) Performed: Procedure(s): RIGHT BREAST LUMPECTOMY WITH RADIOACTIVE SEED AND SENTINEL LYMPH NODE MAPPING (Right)  Patient Location: PACU  Anesthesia Type:General  Level of Consciousness: awake, alert  and oriented  Airway & Oxygen Therapy: Patient Spontanous Breathing and Patient connected to face mask oxygen  Post-op Assessment: Report given to RN and Post -op Vital signs reviewed and stable  Post vital signs: Reviewed and stable  Last Vitals:  Filed Vitals:   02/13/15 1455  BP: 130/56  Pulse: 66  Temp:   Resp: 13    Complications: No apparent anesthesia complications

## 2015-02-13 NOTE — Anesthesia Procedure Notes (Signed)
Procedure Name: Intubation Date/Time: 02/13/2015 3:22 PM Performed by: Maryella Shivers Pre-anesthesia Checklist: Patient identified, Emergency Drugs available, Suction available and Patient being monitored Patient Re-evaluated:Patient Re-evaluated prior to inductionOxygen Delivery Method: Circle System Utilized Preoxygenation: Pre-oxygenation with 100% oxygen Intubation Type: IV induction Ventilation: Mask ventilation without difficulty Laryngoscope Size: Mac and 3 Grade View: Grade I Tube type: Oral Tube size: 7.0 mm Number of attempts: 1 Airway Equipment and Method: Stylet and Oral airway Placement Confirmation: ETT inserted through vocal cords under direct vision,  positive ETCO2 and breath sounds checked- equal and bilateral Secured at: 20 cm Tube secured with: Tape Dental Injury: Teeth and Oropharynx as per pre-operative assessment

## 2015-02-13 NOTE — H&P (Signed)
Shannon Hancock 01/23/2015 3:44 PM Location: [facility] Patient #: [id number] DOB: Jun 04, 1951 Married / Language: English / Race: Black or African American Female  History of Present Illness Shannon Hancock. Shannon Starks MD; 01/23/2015 4:40 PM) Patient words: breast cancer.  The patient is a 64 year old female who presents with breast cancer. We are asked to see the patient in consultation by Dr. Shon Hancock to evaluate her for a right breast cancer. The patient is a 64 year old black female who recently felt an awareness of her right breast. She denied any pain or discharge from the nipple. She asked for a mammogram, ultrasound, and MRI study. These studies were all very consistent with her findings. There was a 1 cm mass in the lower outer quadrant of the right breast. This was biopsied and came back as an invasive breast cancer. No abnormal lymph nodes were seen. The tumor markers have not been reported yet. She is not taking any hormone replacement therapy. She does have a significant family history for breast cancer in a sister and multiple aunts   Other Problems Shannon Hancock, Shannon Hancock; 01/23/2015 3:44 PM) Anxiety Disorder Arthritis Back Pain Gastroesophageal Reflux Disease Heart murmur High blood pressure Hypercholesterolemia Migraine Headache Sleep Apnea  Past Surgical History Shannon Hancock, Hancock; 01/23/2015 3:44 PM) Breast Biopsy Right. multiple Cataract Surgery Right. Foot Surgery Left. Tonsillectomy  Diagnostic Studies History Shannon Hancock, Shannon Hancock; 01/23/2015 3:44 PM) Colonoscopy 5-10 years ago Mammogram within last year Pap Smear 1-5 years ago  Allergies Shannon Hancock, Shannon Hancock; 01/23/2015 3:44 PM) No Known Drug Allergies07/25/2016  Medication History Shannon Hancock, Shannon Hancock; 01/23/2015 3:47 PM) ALPRAZolam (1MG  Tablet, Oral) Active. AmLODIPine Besylate (5MG  Tablet, Oral) Active. Bentyl (20MG  Tablet, Oral) Active. Allegra Allergy  (180MG  Tablet, Oral) Active. Losartan Potassium-HCTZ (100-12.5MG  Tablet, Oral) Active. Lyrica (100MG  Capsule, Oral) Active. Metaxalone (800MG  Tablet, Oral) Active. Omeprazole (20MG  Capsule DR, Oral) Active. Metamucil (58.6% Packet, Oral) Active. VESIcare (10MG  Tablet, Oral) Active. Zetia (10MG  Tablet, Oral) Active. Medications Reconciled  Social History Shannon Hancock, Shannon Hancock; 01/23/2015 3:44 PM) Alcohol use Remotely quit alcohol use. Caffeine use Coffee, Tea. No drug use Tobacco use Former smoker.  Family History Shannon Hancock, Shannon Hancock; 01/23/2015 3:44 PM) Arthritis Father, Mother, Sister. Bleeding disorder Father, Mother, Sister. Breast Cancer Mother, Sister. Colon Polyps Sister. Depression Mother. Diabetes Mellitus Father, Mother, Sister. Heart Disease Father. Heart disease in female family member before age 42 Hypertension Brother, Father, Mother, Sister. Kidney Disease Sister. Prostate Cancer Brother.  Pregnancy / Birth History Shannon Hancock, Shannon Hancock; 01/23/2015 3:44 PM) Age at menarche 69 years. Age of menopause 11-60 Contraceptive History Oral contraceptives. Gravida 2 Maternal age 51-30 Para 2  Review of Systems Shannon Hancock; 01/23/2015 3:44 PM) HEENT Present- Hearing Loss, Ringing in the Ears and Seasonal Allergies. Not Present- Earache, Hoarseness, Nose Bleed, Oral Ulcers, Sinus Pain, Sore Throat, Visual Disturbances, Wears glasses/contact lenses and Yellow Eyes. Breast Present- Breast Mass. Not Present- Breast Pain, Nipple Discharge and Skin Changes. Gastrointestinal Present- Bloating and Excessive gas. Not Present- Abdominal Pain, Bloody Stool, Change in Bowel Habits, Chronic diarrhea, Constipation, Difficulty Swallowing, Gets full quickly at meals, Hemorrhoids, Indigestion, Nausea, Rectal Pain and Vomiting. Musculoskeletal Present- Back Pain, Joint Pain, Joint Stiffness and Muscle Pain. Not Present- Muscle Weakness and  Swelling of Extremities. Neurological Present- Headaches. Not Present- Decreased Memory, Fainting, Numbness, Seizures, Tingling, Tremor, Trouble walking and Weakness. Psychiatric Present- Anxiety and Depression. Not Present- Bipolar, Change in Sleep Pattern, Fearful and Frequent crying. Endocrine  Present- Cold Intolerance. Not Present- Excessive Hunger, Hair Changes, Heat Intolerance, Hot flashes and New Diabetes.   Vitals Shannon Hancock; 01/23/2015 3:44 PM) 01/23/2015 3:44 PM Weight: 148 lb Height: 64in Body Surface Area: 1.74 m Body Mass Index: 25.4 kg/m BP: 118/76 (Sitting, Left Arm, Standard)    Physical Exam Shannon Hancock S. Shannon Starks MD; 01/23/2015 4:41 PM) General Mental Status-Alert. General Appearance-Consistent with stated age. Hydration-Well hydrated. Voice-Normal.  Head and Neck Head-normocephalic, atraumatic with no lesions or palpable masses. Trachea-midline. Thyroid Gland Characteristics - normal size and consistency.  Eye Eyeball - Bilateral-Extraocular movements intact. Sclera/Conjunctiva - Bilateral-No scleral icterus.  Chest and Lung Exam Chest and lung exam reveals -quiet, even and easy respiratory effort with no use of accessory muscles and on auscultation, normal breath sounds, no adventitious sounds and normal vocal resonance. Inspection Chest Wall - Normal. Back - normal.  Breast Note: There is a palpable bruising in the lower outer quadrant of the right breast. Other than this there is no palpable mass in either breast. There is no palpable axillary, supraclavicular, or cervical lymphadenopathy.   Cardiovascular Cardiovascular examination reveals -normal heart sounds, regular rate and rhythm with no murmurs and normal pedal pulses bilaterally.  Abdomen Inspection Inspection of the abdomen reveals - No Hernias. Skin - Scar - no surgical scars. Palpation/Percussion Palpation and Percussion of the abdomen reveal - Soft, Non  Tender, No Rebound tenderness, No Rigidity (guarding) and No hepatosplenomegaly. Auscultation Auscultation of the abdomen reveals - Bowel sounds normal.  Neurologic Neurologic evaluation reveals -alert and oriented x 3 with no impairment of recent or remote memory. Mental Status-Normal.  Musculoskeletal Normal Exam - Left-Upper Extremity Strength Normal and Lower Extremity Strength Normal. Normal Exam - Right-Upper Extremity Strength Normal and Lower Extremity Strength Normal.  Lymphatic Head & Neck  General Head & Neck Lymphatics: Bilateral - Description - Normal. Axillary  General Axillary Region: Bilateral - Description - Normal. Tenderness - Non Tender. Femoral & Inguinal  Generalized Femoral & Inguinal Lymphatics: Bilateral - Description - Normal. Tenderness - Non Tender.    Assessment & Plan Shannon Hancock S. 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 I have talked her in detail about the different options for treatment and at this point she favors breast conservation. I think this is a very reasonable way to treat her cancer. Since her lymph nodes are clinically negative she is also a good candidate for sentinel node mapping. I have discussed with her in detail the risks and benefits of the operation to do this as well as some of the technical aspects and she understands and wishes to proceed. I will plan for a right breast radioactive seed localized lumpectomy and sentinel node mapping Current Plans  Referred to Oncology, for evaluation and follow up (Oncology). Pt Education - Breast Cancer: discussed with patient and provided information.   Signed by Luella Cook, MD (01/23/2015 4:41 PM)

## 2015-02-14 ENCOUNTER — Encounter (HOSPITAL_BASED_OUTPATIENT_CLINIC_OR_DEPARTMENT_OTHER): Payer: Self-pay | Admitting: General Surgery

## 2015-02-14 LAB — POCT HEMOGLOBIN-HEMACUE: HEMOGLOBIN: 12.2 g/dL (ref 12.0–15.0)

## 2015-02-15 ENCOUNTER — Telehealth: Payer: Self-pay | Admitting: Hematology

## 2015-02-15 ENCOUNTER — Telehealth: Payer: Self-pay | Admitting: *Deleted

## 2015-02-15 ENCOUNTER — Encounter: Payer: Self-pay | Admitting: *Deleted

## 2015-02-15 NOTE — Telephone Encounter (Signed)
Pt called and request to see Dr. Lindi Adie instead of Dr. Burr Medico. Notified physicians. Discussed f/u appt scheduled and confirmed new appt with Dr. Lindi Adie. Discussed oncotype testing. Gave pt contact information for further questions or concerns.

## 2015-02-15 NOTE — Telephone Encounter (Signed)
Received order from Dr. Burr Medico for oncotype testing. Requisition sent to pathology.

## 2015-02-15 NOTE — Telephone Encounter (Signed)
Spoke with patient and she request not to make a follow up appointment with dr Burr Medico and request to see someone else,i have transferred her call to dawn s whom sent the pof to advise

## 2015-02-21 ENCOUNTER — Telehealth: Payer: Self-pay | Admitting: *Deleted

## 2015-02-21 NOTE — Telephone Encounter (Signed)
FORWARD TO DR.FENG'S NURSE, THU BRAY,RN.

## 2015-02-22 ENCOUNTER — Telehealth: Payer: Self-pay | Admitting: Hematology and Oncology

## 2015-02-22 ENCOUNTER — Encounter: Payer: Self-pay | Admitting: *Deleted

## 2015-02-22 NOTE — Telephone Encounter (Signed)
lvm for pt regarding to Sept appt.genetics appt.Marland KitchenMarland Kitchen

## 2015-02-22 NOTE — Telephone Encounter (Signed)
returned call and lvm to call back to confirm appts

## 2015-02-27 ENCOUNTER — Ambulatory Visit
Admission: RE | Admit: 2015-02-27 | Discharge: 2015-02-27 | Disposition: A | Discharge status: Home / Self Care | Payer: Medicare Other | Source: Ambulatory Visit | Attending: Radiation Oncology | Admitting: Radiation Oncology

## 2015-02-27 ENCOUNTER — Encounter: Payer: Self-pay | Admitting: Radiation Oncology

## 2015-02-27 VITALS — BP 116/56 | HR 75 | Temp 99.0°F | Resp 16 | Ht 64.0 in | Wt 145.8 lb

## 2015-02-27 DIAGNOSIS — M797 Fibromyalgia: Secondary | ICD-10-CM | POA: Insufficient documentation

## 2015-02-27 DIAGNOSIS — R011 Cardiac murmur, unspecified: Secondary | ICD-10-CM | POA: Insufficient documentation

## 2015-02-27 DIAGNOSIS — K5790 Diverticulosis of intestine, part unspecified, without perforation or abscess without bleeding: Secondary | ICD-10-CM | POA: Insufficient documentation

## 2015-02-27 DIAGNOSIS — C50511 Malignant neoplasm of lower-outer quadrant of right female breast: Secondary | ICD-10-CM | POA: Insufficient documentation

## 2015-02-27 DIAGNOSIS — I10 Essential (primary) hypertension: Secondary | ICD-10-CM | POA: Insufficient documentation

## 2015-02-27 DIAGNOSIS — Z809 Family history of malignant neoplasm, unspecified: Secondary | ICD-10-CM | POA: Insufficient documentation

## 2015-02-27 DIAGNOSIS — Z510 Encounter for antineoplastic radiation therapy: Secondary | ICD-10-CM | POA: Insufficient documentation

## 2015-02-27 DIAGNOSIS — F419 Anxiety disorder, unspecified: Secondary | ICD-10-CM | POA: Insufficient documentation

## 2015-02-27 NOTE — Progress Notes (Signed)
Radiation Oncology         [phone] ________________________________  Initial Consultation  Name: Shannon Hancock MRN: [id number]  Date: 02/27/2015  DOB: 1951/05/03  DX:IPJAS,NKNLZJQ S, MD  [name], MD      REFERRING PHYSICIAN: [name], MD  DIAGNOSIS: The encounter diagnosis was Breast cancer of lower-outer quadrant of right female breast.  Breast cancer of lower-outer quadrant of right female breast   Staging form: Breast, AJCC 7th Edition     Clinical: Stage IA (T1b, N0, M0) - Unsigned     Pathologic stage from 02/13/2015: Stage IB (T1b, N56m, cM0) - Unsigned   HISTORY OF PRESENT ILLNESS::Shannon Hancock a 64y.o. female who is seen for an initial consultation visit regarding the patient's diagnosis of breast cancer.  The patient was found to have suspicious findings within the right breast on initial mammogram. The patient has had symptoms prior to this study: numbness and tingling. A diagnostic mammogram and breast ultrasound confirmed this finding. On ultrasound, an 8 o'clock position tumor measured 0.6 cm and was present in the lower-outer quadrant. In addition, a 10 o'clock position cyst was found.   A lumpectomy was performed on 02/13/15. 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 The 10 o'clock cyst was found to be benign. Resection margins were negative.  Receptors studies were completed and indicate that the tumor is estrogen receptor positive, progesterone receptor positive, and Her-2/neu negative. The Ki-67 staining was 10%. Biopsy of 1 out of 2 axillary sentinel nodes was positive for carcinoma, this is a micrometastasis.   The patient has undergone an MRI scan of the breasts.  Patient has healed satisfactorily post-lumpectomy with minor discomfort in the area and is ready to proceed with radiation treatment to the right breast in about 2 weeks.    PREVIOUS  RADIATION THERAPY: No   PAST MEDICAL HISTORY:  has a past medical history of Allergy; Hypertension; Fibromyalgia; Diverticulosis; Heart murmur; and Anxiety.     PAST SURGICAL HISTORY: Past Surgical History  Procedure Laterality Date  . Tubal ligation    . Foot surgery    . Breast lumpectomy with radioactive seed and sentinel lymph node biopsy Right 02/13/2015    Procedure: RIGHT BREAST LUMPECTOMY WITH RADIOACTIVE SEED AND SENTINEL LYMPH NODE MAPPING;  Surgeon: PAutumn MessingIII, MD;  [facility]  Service: General;  Laterality: Right;     FAMILY HISTORY: family history includes Alzheimer's disease in her mother; Cancer in her cousin, father, and maternal aunt; Cancer (age of onset: 667 in her sister; Coronary artery disease in her father; Depression in her father; Hyperlipidemia in her brother; Hypertension in her brother and father; Stroke in her mother.   SOCIAL HISTORY:  reports that she quit smoking about 42 years ago. She has never used smokeless tobacco. She reports that she does not drink alcohol or use illicit drugs.   ALLERGIES: Olive oil   MEDICATIONS:  Current Outpatient Prescriptions  Medication Sig Dispense Refill  . ALPRAZolam (XANAX) 1 MG tablet TAKE 1 TABLET BY MOUTH 4 TIMES A DAY AS NEEDED FOR SLEEP OR ANXIETY 120 tablet 0  . amLODipine (NORVASC) 5 MG tablet Take 5 mg by mouth daily.    . fexofenadine (ALLEGRA) 180 MG tablet Take 180 mg by mouth daily.    .Marland KitchenLYRICA 100 MG capsule TAKE ONE CAPSULE TWICE A DAY 60 capsule 3  .  naproxen sodium (ANAPROX) 220 MG tablet Take 220 mg by mouth as needed.    . psyllium (METAMUCIL) 58.6 % powder Take 1 packet by mouth daily.    . tizanidine (ZANAFLEX) 2 MG capsule Take 2 mg by mouth at bedtime as needed for muscle spasms.    Marland Kitchen ZETIA 10 MG tablet TAKE 1 TABLET BY MOUTH DAILY. 90 tablet 1  . conjugated estrogens (PREMARIN) vaginal cream Place 1 Applicatorful vaginally daily. (Patient not taking: Reported on  02/10/2015) 30 g 0  . losartan-hydrochlorothiazide (HYZAAR) 100-12.5 MG per tablet TAKE 1 TABLET BY MOUTH DAILY. 30 tablet 0  . meloxicam (MOBIC) 15 MG tablet Take by mouth.    Marland Kitchen omeprazole (PRILOSEC) 20 MG capsule Take 20 mg by mouth daily.    Marland Kitchen oxyCODONE-acetaminophen (ROXICET) 5-325 MG per tablet Take 1-2 tablets by mouth every 4 (four) hours as needed. (Patient not taking: Reported on 02/27/2015) 50 tablet 0  . VESICARE 10 MG tablet TAKE 1 TABLET (10 MG TOTAL) BY MOUTH DAILY. (Patient not taking: Reported on 02/10/2015) 84 tablet 1   No current facility-administered medications for this encounter.     REVIEW OF SYSTEMS:  A 15 point review of systems is documented in the electronic medical record. This was obtained by the nursing staff. However, I reviewed this with the patient to discuss relevant findings and make appropriate changes.  Pertinent items are noted in HPI.    PHYSICAL EXAM:  height is _0  (1.626 m) and weight is 145 lb 12.8 oz (66.134 kg). Her oral temperature is 99 F (37.2 C). Her blood pressure is 116/56 and her pulse is 75. Her respiration is 16 and oxygen saturation is 100%.    ECOG = 0  0 - Asymptomatic (Fully active, able to carry on all predisease activities without restriction)  1 - Symptomatic but completely ambulatory (Restricted in physically strenuous activity but ambulatory and able to carry out work of a light or sedentary nature. For example, light housework, office work)  2 - Symptomatic, <50% in bed during the day (Ambulatory and capable of all self care but unable to carry out any work activities. Up and about more than 50% of waking hours)  3 - Symptomatic, >50% in bed, but not bedbound (Capable of only limited self-care, confined to bed or chair 50% or more of waking hours)  4 - Bedbound (Completely disabled. Cannot carry on any self-care. Totally confined to bed or chair)  5 - Death   Eustace Pen MM, Creech RH, Tormey DC, et al. [phone]). "Toxicity and  response criteria of the [facility]". [place] Oncol. 5 (6): 649-55  General: Well-developed, in no acute distress HEENT: Normocephalic, atraumatic; oral cavity clear Neck: Supple without any lymphadenopathy Cardiovascular: Regular rate and rhythm Respiratory: Clear to auscultation bilaterally Breasts: Right breast has a well-healed incision in lower-outer area and well-healed axillary incision. No underlying masses were felt. // Left breast appears normal upon exam with no underlying masses felt.  GI: Soft, nontender, normal bowel sounds Extremities: No edema present Neuro: No focal deficits   LABORATORY DATA:  Lab Results  Component Value Date   WBC 6.1 03/16/2013   HGB 12.2 02/13/2015   HCT 34.9* 03/16/2013   MCV 80.8 03/16/2013   PLT 207.0 03/16/2013   Lab Results  Component Value Date   NA 138 02/08/2015   K 3.7 02/08/2015   CL 101 02/08/2015   CO2 30 02/08/2015   Lab Results  Component Value Date  ALT 16 03/16/2013   AST 20 03/16/2013   ALKPHOS 51 03/16/2013   BILITOT 0.4 03/16/2013      RADIOGRAPHY: Mr Foot Right W Wo Contrast  02/01/2015   CLINICAL DATA:  Persistent heel pain after stepping on glass 5 or 6 months ago. Evaluate for foreign body. History of breast cancer. Initial encounter.  EXAM: MRI OF THE RIGHT HINDFOOT WITHOUT AND WITH CONTRAST  TECHNIQUE: Multiplanar, multisequence MR imaging was performed both before and after administration of intravenous contrast.  CONTRAST:  71m MULTIHANCE GADOBENATE DIMEGLUMINE 529 MG/ML IV SOLN  COMPARISON:  None.  FINDINGS: The presumed site of the previous puncture wound is best seen on the T1 weighted images, with altered signal in the subcutaneous fat along the posterior inferior aspect of the calcaneal tuberosity. There is a linear focus of decreased T1 and T2 signal in this area, extending to the cortex. This measures approximately 1.6 cm in length. There is mild surrounding enhancement  following contrast. No focal fluid collection or definite foreign body identified. There is no evidence of cortical destruction, bone marrow edema or abnormal marrow enhancement.  This processes posterior to calcaneal attachment of the plantar fascia which appears normal. The ankle tendons and ligaments appear normal.  Patient does have a type 2 accessory navicular with cortical irregularity and mild cyst formation adjacent to the articulation. No acute or other significant osseous findings demonstrated.  IMPRESSION: 1. Mild signal alteration within the subcutaneous fat along the posterior plantar aspect of the calcaneal tuberosity at site of presumed previous puncture wound. No soft tissue abscess, definite foreign body or evidence of osteomyelitis. 2. Type 2 accessory navicular.   Electronically Signed   By: WRichardean SaleM.D.   On: 02/01/2015 08:33   Nm Sentinel Node Inj-no Rpt (breast)  02/13/2015   CLINICAL DATA: right breast cancer   Sulfur colloid was injected intradermally by the nuclear medicine  technologist for breast cancer sentinel node localization.    Mm Breast Surgical Specimen  02/13/2015   CLINICAL DATA:  Specimen radiograph status post right lumpectomy  EXAM: SPECIMEN RADIOGRAPH OF THE RIGHT BREAST  COMPARISON:  Previous exam(s).  FINDINGS: Status post excision of the right breast. The radioactive seed and biopsy marker clip are present, completely intact, and were marked for pathology. This was communicated with Patty in the OR at 4:12 p.m. on 02/13/2015.  IMPRESSION: Specimen radiograph of the right breast.   Electronically Signed   By: MAmmie FerrierM.D.   On: 02/13/2015 16:47   Mm Rt Radioactive Seed Loc Mammo Guide  02/08/2015   CLINICAL DATA:  64year old female for localization of right breast cancer prior to right lumpectomy.  EXAM: MAMMOGRAPHIC GUIDED RADIOACTIVE SEED LOCALIZATION OF THE RIGHT BREAST  COMPARISON:  Previous exam(s).  FINDINGS: Patient presents for  radioactive seed localization prior to right lumpectomy. I met with the patient and we discussed the procedure of seed localization including benefits and alternatives. We discussed the high likelihood of a successful procedure. We discussed the risks of the procedure including infection, bleeding, tissue injury and further surgery. We discussed the low dose of radioactivity involved in the procedure. Informed, written consent was given.  The usual time-out protocol was performed immediately prior to the procedure.  Using mammographic guidance, sterile technique, 2% lidocaine and an I-125 radioactive seed, the ribbon shaped clip was localized using a superior approach. The follow-up mammogram images confirm the seed in the expected location and are marked for Dr. TMarlou Starks  Follow-up survey of the  patient confirms presence of the radioactive seed.  Order number of I-125 seed:  [id number].  Total activity:  0.253 mCi  Reference Date: 12/30/2014  The patient tolerated the procedure well and was released from the [facility]. She was given instructions regarding seed removal.  IMPRESSION: Radioactive seed localization right breast. No apparent complications.   Electronically Signed   By: Margarette Canada M.D.   On: 02/08/2015 14:08      IMPRESSION:  Oncology History   Breast cancer of lower-outer quadrant of right female breast   Staging form: Breast, AJCC 7th Edition     Clinical: Stage IA (T1b, N0, M0) - Unsigned     Pathologic stage from 02/13/2015: Stage IB (T1b, N35m, cM0) - Unsigned        Breast cancer of lower-outer quadrant of right female breast   01/05/2015 Imaging A 10 mm diameter enhancing mass in the right lower outer breast, otherwise negative.   01/18/2015 Mammogram Diagnostic mammo and ultrasound showed a 0.6 cm mass in the right breast 8:00 location 6 cm from the nipple, and a 0.5 cm lesion in the 10:00 location of the right breast 6 cm from the nipple. Right axilla was negative for adenopathy.    01/19/2015 Initial Diagnosis Breast cancer of lower-outer quadrant of right female breast   01/19/2015 Initial Biopsy Right breast 8:00 mass biopsy showed invasive ductal carcinoma and DCIS, grade 1-2, the 10:00 mass biopsy showed fibrocystic change.   01/19/2015 Receptors her2 ER 100% positive, PR 90% positive, HER-2 negative, Ki-67 10%   02/13/2015 Surgery Right breast lumpectomy and sentinel lymph node biopsy, margins were negative   02/13/2015 Pathology Results Invasive ductal carcinoma, G1, 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 1 out of 2 lymph nodes were positive for micrometastasis.    The patient has a recent diagnosis of invasive ductal carcinoma of the right breast. She appears to be a good candidate for breast conservation treatment. The patient is healing well post-lumpectomy.   I discussed with the patient the role of adjuvant radiation treatment in this setting. We discussed the potential benefit of radiation treatment, especially with regards to local control of the patient's tumor. We also discussed the possible side effects and risks of such a treatment as well.  All of the patient's questions were answered. The patient wishes to proceed with radiation treatment at the appropriate time.  PLAN: I look forward to seeing the patient postoperatively to review her case and further discuss and coordinate an anticipated course of radiation treatment. I anticipate a 4 week course of treatment with high tangent fields..   We will tentatively schedule a simulation appointment for the 03/10/15. This timing will allow her to fully recover from her surgery.  She will see medical oncology the day before.   I spent 60 minutes face to face with the patient and more than 50% of that time was spent in counseling and/or coordination of care.    ________________________________   JJodelle Gross MD, PhD   **Disclaimer: This note was dictated with voice recognition software. Similar sounding words  can inadvertently be transcribed and this note may contain transcription errors which may not have been corrected upon publication of note.**  This document serves as a record of services personally performed by JKyung Rudd MD. It was created on his behalf by ADerek Mound a trained medical scribe. The creation of this record is based on the scribe's personal observations and the provider's statements to them. This document has been  checked and approved by the attending provider.

## 2015-02-27 NOTE — Progress Notes (Addendum)
Location of Breast Cancer: Right Breast Lower Outer Quadrant   Histology per Pathology Report: Diagnosis 01/19/15: 1. Breast, right, needle core biopsy, 10 o'clock- FIBROCYSTIC CHANGES.- NO MALIGNANCY IDENTIFIED. 2. Breast, right, needle core biopsy, 8 o'clock- INVASIVE DUCTAL CARCINOMA.+100%- DUCTAL CARCINOMA IN SITU  Receptor Status: ER( +100%  ), PR ( +90%  ), Her2-neu (  Neg.KI-67 10%)  Did patient present with symptoms (if so, please note symptoms) or was this found on screening mammography?: Patient felt  Numbness tingling  in right breast   Past/Anticipated interventions by surgeon, if YDS:[id number] 02/13/15: Dr. Eddie Dibbles Toth,III,MD,Right Lumpectomy with radioactive seed and sentinel lymph node mapping:  1. Lymph node, sentinel, biopsy, right axillary #1- ONE OF ONE LYMPH NODES POSITIVE FOR CARCINOMA (MICROMETASTASIS <0.2 CM) (1/1). 2. Breast, lumpectomy, right- INVASIVE DUCTAL CARCINOMA, GRADE 1, SPANNING 0.9 CM.- DUCTAL CARCINOMA IN SITU, INTERMEDIATE GRADE.- BIOPSY SITE CHANGE.- RESECTION MARGINS ARE NEGATIV.- SEE ONCOLOGY TABLE. 3. Lymph node, sentinel, biopsy, Right #2- ONE OF ONE LYMPH NODES NEGATIVE FOR CARCINOMA (0/1).  Past/Anticipated interventions by medical oncology, if any: Chemotherapy :Dr. Lindi Adie appt 03/09/15  Lymphedema issues, if any:  NO  Pain issues, if any:  Under Right axilla tender and sore, incision well healed, and on breast glue still on incision, healed well, hx Fibromyalgia  SAFETY ISSUES:  Prior radiation? NO  Pacemaker/ICD? NO  Possible current pregnancy? NO  Is the patient on methotrexate? NO  Current Complaints / other details: Married,  On Disability;  Menarche age 35, G42P2, Oral contraceptives , Menopause[age],  Anxiety ,  Heart Murmur, HTN,Sleep apnea,migraines, former smoker cigarettes, no alcohol or illicit drug use, never used smokeless tobacco  Mother Breast Cancer,Stroke,alzheimer's, , Father heart disease, prostate cancer, , Brother prostate  cancer, Multiple aunts and a sister with Breast cancer  Allergies      l;Olive oil -N&V if eaten    Rebecca Eaton, RN 02/27/2015,7:15 AM  BP 116/56 mmHg  Pulse 75  Temp(Src) 99 F (37.2 C) (Oral)  Resp 16  Ht _0  (1.626 m)  Wt 145 lb 12.8 oz (66.134 kg)  BMI 25.01 kg/m2  SpO2 100%  Wt Readings from Last 3 Encounters:  02/27/15 145 lb 12.8 oz (66.134 kg)  02/08/15 154 lb (69.854 kg)  02/10/15 149 lb 6.4 oz (67.767 kg)

## 2015-02-27 NOTE — Progress Notes (Signed)
Please see the Nurse Progress Note in the MD Initial Consult Encounter for this patient. 

## 2015-02-28 ENCOUNTER — Encounter (HOSPITAL_COMMUNITY): Payer: Self-pay

## 2015-02-28 ENCOUNTER — Ambulatory Visit (INDEPENDENT_AMBULATORY_CARE_PROVIDER_SITE_OTHER): Payer: Medicare Other | Admitting: Podiatry

## 2015-02-28 ENCOUNTER — Encounter: Payer: Self-pay | Admitting: Podiatry

## 2015-02-28 VITALS — BP 109/64 | HR 80 | Resp 16 | Ht 64.0 in | Wt 146.0 lb

## 2015-02-28 DIAGNOSIS — M795 Residual foreign body in soft tissue: Secondary | ICD-10-CM | POA: Diagnosis not present

## 2015-02-28 DIAGNOSIS — L84 Corns and callosities: Secondary | ICD-10-CM | POA: Diagnosis not present

## 2015-02-28 NOTE — Progress Notes (Signed)
   Subjective:    Patient ID: Shannon Hancock, female    DOB: 1951-06-09, 64 y.o.   MRN: [id number]  HPI 64 year old female presents the office today with concerns of a painful callus to the back of her right heel. She states that several weeks ago she had a piecea plastic silverware penetrated her skin. She states that she believes the whole thing came out. She states previous using 2 other physicians for this. X-ray was obtained which she states they were able to identify form body. MRI was performed which was negative. She states another podiatrist previously trims the area and appliesmedicine on to help get rid of the callus howevera continues to recur. She states that she has pain with active or he'll continue with pressure. She denies any surrounding redness or red streaks.no other complaints at this time.  Review of Systems  Constitutional: Positive for fatigue.  HENT:       Ringing in ears  Sinus problems  Eyes: Positive for redness and itching.  Gastrointestinal: Positive for abdominal pain.       Bloating  Endocrine: Positive for cold intolerance.       Excessive thirst   Musculoskeletal:       Joint pain  Back problems  Allergic/Immunologic: Positive for environmental allergies.  Neurological: Positive for headaches.  Psychiatric/Behavioral: The patient is nervous/anxious.   All other systems reviewed and are negative.      Objective:   Physical Exam AAO x3, NAD DP/PT pulses palpable bilaterally, CRT less than 3 seconds Protective sensation intact with Simms Weinstein monofilament, vibratory sensation intact, Achilles tendon reflex intact On the posterior aspect of the right heel there is a 1 x 1 cm hyperkeratotic lesion which appears to have some dried blood underneath it. Upon debridement there is a central area of scab. Upon debridement of that area Appears to be a superficial wound. There is tenderness to palpation overlying this area. There is no definitive form body  identified this time. The central area appears to be a site of the puncture wound. There is no surrounding erythema, ascending cellulitis, fluctuance, crepitus, malodor. There is tenderness directly upon palpation of the lesion. No other areas of tenderness to bilateral lower extremities. MMT 5/5, ROM WNL.  No open lesions or pre-ulcerative lesions.  No overlying edema, erythema, increase in warmth to bilateral lower extremities.  No pain with calf compression, swelling, warmth, erythema bilaterally.      Assessment & Plan:  64 year old female with right posterior heel callus with possible retained foreign body -MRI results were discussed and reviewed with the patient. -Treatment options discussed including all alternatives, risks, and complications -Lesion sharply debrided without complication/bleeding. Central area does appear to have puncture wound. Because of this and the continued pain I do believe that it may be a residual foreign body. It may have on MRI however I would like to obtain an ultrasound of the area to evaluate for possible foreign body. Discussed that there is a residual foreign body she would likely need surgery to have it removed. Follow-up after ultrasound or sooner if any problems are to arise. Continue antibiotic ointment and a Band-Aid over the area and offloading pad. In the meantime I encouraged her to call the office with any questions, concerns, change in symptoms. Follow-up with PCP for other issues mentioned in the review of systems.  Celesta Gentile, DPM

## 2015-03-01 ENCOUNTER — Telehealth: Payer: Self-pay | Admitting: *Deleted

## 2015-03-01 ENCOUNTER — Other Ambulatory Visit: Payer: Self-pay | Admitting: Family Medicine

## 2015-03-01 ENCOUNTER — Other Ambulatory Visit: Payer: Self-pay | Admitting: Podiatry

## 2015-03-01 DIAGNOSIS — M795 Residual foreign body in soft tissue: Secondary | ICD-10-CM

## 2015-03-01 NOTE — Telephone Encounter (Addendum)
-----   Message from Trula Slade, DPM sent at 02/28/2015  7:33 PM EDT ----- Can we order and ultrasound of the right heel to evaluate for foreign body? Thanks.  Informed pt the Ultrasound of the right heel could be scheduled at [place].  Pt states understanding.

## 2015-03-02 ENCOUNTER — Ambulatory Visit
Admission: RE | Admit: 2015-03-02 | Discharge: 2015-03-02 | Disposition: A | Discharge status: Home / Self Care | Payer: Medicare Other | Source: Ambulatory Visit | Attending: Podiatry | Admitting: Podiatry

## 2015-03-02 DIAGNOSIS — M795 Residual foreign body in soft tissue: Secondary | ICD-10-CM | POA: Insufficient documentation

## 2015-03-04 ENCOUNTER — Other Ambulatory Visit: Payer: Self-pay | Admitting: Family Medicine

## 2015-03-07 ENCOUNTER — Encounter: Payer: Self-pay | Admitting: *Deleted

## 2015-03-07 NOTE — Progress Notes (Signed)
[facility] Psychosocial Distress Screening Clinical Social Work  Clinical Social Work was referred by distress screening protocol.  The patient scored a 7 on the Psychosocial Distress Thermometer which indicates moderate distress. Clinical Social Worker contacted patient at home to assess for distress and other psychosocial needs. Patient stated she was doing well and recovering from surgery.  CSW and patient discussed the support team and support services at [facility].  Patient expressed interest in support programs and requested to be on the support center mailing list.  CSW encouraged patient to call with questions or concerns.       ONCBCN DISTRESS SCREENING 02/27/2015  Screening Type Initial Screening  Distress experienced in past week (1-10) 7  Family Problem type Partner;Other (comment)  Emotional problem type Depression;Adjusting to illness;Nervousness/Anxiety  Information Concerns Type Lack of info about diagnosis  Physical Problem type Pain;Bathing/dressing;Skin dry/itchy  Physician notified of physical symptoms Yes  Referral to clinical social work Yes   Johnnye Lana, MSW, LCSW, OSW-C Clinical Social Worker [facility] [phone]

## 2015-03-09 ENCOUNTER — Encounter: Payer: Self-pay | Admitting: Genetic Counselor

## 2015-03-09 ENCOUNTER — Other Ambulatory Visit: Payer: Self-pay

## 2015-03-09 ENCOUNTER — Ambulatory Visit (INDEPENDENT_AMBULATORY_CARE_PROVIDER_SITE_OTHER): Payer: Medicare Other | Admitting: Podiatry

## 2015-03-09 ENCOUNTER — Encounter: Payer: Self-pay | Admitting: Podiatry

## 2015-03-09 ENCOUNTER — Encounter: Payer: Self-pay | Admitting: Hematology and Oncology

## 2015-03-09 ENCOUNTER — Ambulatory Visit (HOSPITAL_BASED_OUTPATIENT_CLINIC_OR_DEPARTMENT_OTHER): Payer: Medicare Other | Admitting: Hematology and Oncology

## 2015-03-09 ENCOUNTER — Ambulatory Visit: Payer: Medicare Other

## 2015-03-09 ENCOUNTER — Encounter: Payer: Self-pay | Admitting: Family Medicine

## 2015-03-09 VITALS — BP 120/67 | HR 72 | Temp 98.3°F | Resp 18 | Ht 64.0 in | Wt 149.4 lb

## 2015-03-09 VITALS — BP 129/73 | HR 87 | Resp 18

## 2015-03-09 DIAGNOSIS — M795 Residual foreign body in soft tissue: Secondary | ICD-10-CM

## 2015-03-09 DIAGNOSIS — Z170 Estrogen receptor positive status [ER+]: Secondary | ICD-10-CM

## 2015-03-09 DIAGNOSIS — C50511 Malignant neoplasm of lower-outer quadrant of right female breast: Secondary | ICD-10-CM

## 2015-03-09 DIAGNOSIS — M722 Plantar fascial fibromatosis: Secondary | ICD-10-CM

## 2015-03-09 NOTE — Progress Notes (Signed)
Patient ID: Shannon Hancock, female   DOB: [birth year], 65 y.o.   MRN: [id number]  Subjective: 64 year old female presents the office and discuss ultrasound results. She states that she complains of painful the back of her right heel. She continues have a callus over the area which causes pain predicted pressure in shoes. On the central aspect of the callus she does have a wound has been ongoing since the spurring. She denies any redness or any red streaks around the area. Denies any drainage or purulence. No other complaints at this time.  Objective: AAO 3, NAD DP/PT pulses palpable, CRT less than 3 seconds Protective sensation intact with Simms was monofilament On the posterior aspect of the right heel there is a 1 x 1 cm hyperkeratotic lesion with a central ulceration which is superficial. There is no probing, undermining, tunneling. There is no surrounding erythema, ascending cellulitis, fluctuance, crepitus, malodor. There is tenderness to palpation of the overlying this area. No other open lesions or pre-ulcerative lesions. No other areas of tenderness to bilateral lower extremity is. No pain with calf compression, swelling, warmth, erythema.   Assessment : 64 year old female right hallux wound/hyperkeratotic lesion likely as a result of underlying foreign body   Plan : -X-rays were obtained and reviewed with the patient. There is a radiodense area within the posterior portion of the calcaneus over the area of wound concerning for foreign body. MRI and ultrasound results are inconclusive for foreign body. However x-ray findings do show foreign body and given clinical exam with continued wound hyperkeratotic lesion overlying the area I do believe there is likely a foreign body there. I discussed with her various treatment options. The form body that is present on x-ray does. Deep and I do not believe I get is on the office. I discussed with her surgical intervention for exploration and possible  removal of foreign body. Given that the MRI and ultrasound is negative for foreign body I discussed with her that there is a high likelihood that may not find a foreign body however I do believe that given the x-ray changes and clinical exam there is still a residual foreign body. She has a history of foreign body penetration to the areas well. She'll be undergoing radiation treatment approximately 3 weeks for breast cancer in July to have this fixed before starting.  -At this time she has elected to proceed with surgical intervention for exploration and possible removal foreign body.  -The incision placement as well as the postoperative course was discussed with the patient. I discussed risks of the surgery which include, but not limited to, infection, bleeding, pain, swelling, need for further surgery, delayed or nonhealing, painful or ugly scar, numbness or sensation changes, over/under correction, recurrence, transfer lesions, further deformity, hardware failure, DVT/PE, loss of toe/foot. Patient understands these risks and wishes to proceed with surgery. The surgical consent was reviewed with the patient all 3 pages were signed. No promises or guarantees were given to the outcome of the procedure. All questions were answered to the best of my ability. Before the surgery the patient was encouraged to call the office if there is any further questions. The surgery will be performed at the [facility] on an outpatient basis.  Celesta Gentile, DPM

## 2015-03-09 NOTE — Assessment & Plan Note (Signed)
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 1 out of 2 lymph nodes were positive for micrometastasis.Oncotype 12 (8% ROR)  Oncotype DX counseling: I discussed the Oncotype DX score result. It showed a score of 12 which translates into 8% risk of recurrence with tamoxifen alone. Based on this being low risk, we do not recommend systemic chemotherapy.  Recommendation: 1. Adjuvant radiation therapy followed by 2. Adjuvant antiestrogen therapy   Return to clinic towards the end of radiation to start antiestrogen pills.

## 2015-03-09 NOTE — Progress Notes (Signed)
Patient Care Team: [name], MD as PCP - [place] III, MD as Consulting Physician (General Surgery) Truitt Merle, MD as Consulting Physician (Hematology)  DIAGNOSIS: Breast cancer of lower-outer quadrant of right female breast   Staging form: Breast, AJCC 7th Edition     Clinical: Stage IA (T1b, N0, M0) - Unsigned     Pathologic stage from 02/13/2015: Stage IB (T1b, N39m, cM0) - Unsigned   SUMMARY OF ONCOLOGIC HISTORY: Oncology History   Breast cancer of lower-outer quadrant of right female breast   Staging form: Breast, AJCC 7th Edition     Clinical: Stage IA (T1b, N0, M0) - Unsigned     Pathologic stage from 02/13/2015: Stage IB (T1b, N165m cM0) - Unsigned        Breast cancer of lower-outer quadrant of right female breast   01/05/2015 Imaging A 10 mm diameter enhancing mass in the right lower outer breast, otherwise negative.   01/18/2015 Mammogram Diagnostic mammo and ultrasound showed a 0.6 cm mass in the right breast 8:00 location 6 cm from the nipple, and a 0.5 cm lesion in the 10:00 location of the right breast 6 cm from the nipple. Right axilla was negative for adenopathy.   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 1 out of 2 lymph nodes were positive for micrometastasis.Oncotype 12 (8% ROR)    CHIEF COMPLIANT: follow-up to discuss the results of Oncotype DX  INTERVAL HISTORY: Shannon DUSENBURYs a [age]ear old with above-mentioned history of right-sided breast cancer treated with lumpectomy and is here today to discuss the results of Oncotype DX score. She is doing very well from surgery standpoint does not have any major pain or discomfort in the breast. Denies  any other new concerns related to her prior breast surgery and treatment.  REVIEW OF SYSTEMS:   Constitutional: Denies fevers, chills or abnormal weight loss Eyes: Denies blurriness of vision Ears, nose, mouth, throat, and face: Denies mucositis or sore throat Respiratory: Denies cough, dyspnea or wheezes Cardiovascular: Denies palpitation, chest discomfort or lower extremity swelling Gastrointestinal:  Denies nausea, heartburn or change in bowel habits Skin: Denies abnormal skin rashes Lymphatics: Denies new lymphadenopathy or easy bruising Neurological:Denies numbness, tingling or new weaknesses Behavioral/Psych: Mood is stable, no new changes  All other systems were reviewed with the patient and are negative.  I have reviewed the past medical history, past surgical history, social history and family history with the patient and they are unchanged from previous note.  ALLERGIES:  is allergic to olive oil.  MEDICATIONS:  Current Outpatient Prescriptions  Medication Sig Dispense Refill  . ALPRAZolam (XANAX) 1 MG tablet TAKE 1 TABLET BY MOUTH 4 TIMES A DAY AS NEEDED FOR SLEEP OR ANXIETY 120 tablet 0  . amLODipine (NORVASC) 5 MG tablet Take 5 mg by mouth daily.    . fexofenadine (ALLEGRA) 180 MG tablet Take 180 mg by mouth daily.    . Marland Kitchenosartan-hydrochlorothiazide (HYZAAR) 100-12.5 MG per tablet TAKE 1 TABLET BY MOUTH DAILY. 30 tablet 0  . LYRICA 100 MG capsule TAKE ONE CAPSULE TWICE A DAY 60 capsule 3  . meloxicam (MOBIC) 15 MG tablet     . naproxen sodium (ANAPROX) 220 MG tablet Take 220 mg by mouth as needed.    .Marland Kitchen  omeprazole (PRILOSEC) 20 MG capsule Take 20 mg by mouth daily.    Marland Kitchen oxyCODONE-acetaminophen (PERCOCET/ROXICET) 5-325 MG per tablet     . psyllium (METAMUCIL) 58.6 % powder Take 1 packet by mouth daily.    . tizanidine (ZANAFLEX) 2 MG capsule Take 2 mg by mouth at bedtime as needed for muscle spasms.    Marland Kitchen ZETIA 10 MG tablet TAKE 1 TABLET BY MOUTH DAILY. 90 tablet 1   No  current facility-administered medications for this visit.    PHYSICAL EXAMINATION: ECOG PERFORMANCE STATUS: 1 - Symptomatic but completely ambulatory  Filed Vitals:   03/09/15 0957  BP: 120/67  Pulse: 72  Temp: 98.3 F (36.8 C)  Resp: 18   Filed Weights   03/09/15 0957  Weight: 149 lb 6.4 oz (67.767 kg)    GENERAL:alert, no distress and comfortable SKIN: skin color, texture, turgor are normal, no rashes or significant lesions EYES: normal, Conjunctiva are pink and non-injected, sclera clear OROPHARYNX:no exudate, no erythema and lips, buccal mucosa, and tongue normal  NECK: supple, thyroid normal size, non-tender, without nodularity LYMPH:  no palpable lymphadenopathy in the cervical, axillary or inguinal LUNGS: clear to auscultation and percussion with normal breathing effort HEART: regular rate & rhythm and no murmurs and no lower extremity edema ABDOMEN:abdomen soft, non-tender and normal bowel sounds Musculoskeletal:no cyanosis of digits and no clubbing  NEURO: alert & oriented x 3 with fluent speech, no focal motor/sensory deficits  LABORATORY DATA:  I have reviewed the data as listed   Chemistry      Component Value Date/Time   NA 138 02/08/2015 1156   K 3.7 02/08/2015 1156   CL 101 02/08/2015 1156   CO2 30 02/08/2015 1156   BUN 13 02/08/2015 1156   CREATININE 0.91 02/08/2015 1156      Component Value Date/Time   CALCIUM 10.0 02/08/2015 1156   ALKPHOS 51 03/16/2013 0939   AST 20 03/16/2013 0939   ALT 16 03/16/2013 0939   BILITOT 0.4 03/16/2013 0939       Lab Results  Component Value Date   WBC 6.1 03/16/2013   HGB 12.2 02/13/2015   HCT 34.9* 03/16/2013   MCV 80.8 03/16/2013   PLT 207.0 03/16/2013   NEUTROABS 3.5 03/16/2013   ASSESSMENT & PLAN:  Breast cancer of lower-outer quadrant of right female breast Right breast lumpectomy 02/13/2015: margins negative; Invasive ductal carcinoma, G1, 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 1 out of 2 lymph  nodes were positive for micrometastasis.Oncotype 12 (8% ROR) T1b N1 mic stage IB  Pathology review: I discussed the final pathology report in great detail and provided her with written information. She now understands the difference between DCIS and invasive breast cancer. Having one micrometastases in the lymph node is concerning to her. I emphasized that we treat that as if it was node-negative.Her final stages stage IB  Oncotype DX counseling: I discussed the Oncotype DX score result. It showed a score of 12 which translates into 8% risk of recurrence with tamoxifen alone. Based on this being low risk, we do not recommend systemic chemotherapy.  Recommendation: 1. Adjuvant radiation therapy followed by 2. Adjuvant antiestrogen therapy   Anastrozole counseling:We discussed the risks and benefits of anti-estrogen therapy with aromatase inhibitors. These include but not limited to insomnia, hot flashes, mood changes, vaginal dryness, bone density loss, and weight gain. Although rare, serious side effects including endometrial cancer, risk of blood clots were also discussed. We strongly believe that the benefits far outweigh the  risks. Patient understands these risks and consented to starting treatment. Planned treatment duration is 5 years.  Return to clinic towards the end of radiation to start antiestrogen pills.   No orders of the defined types were placed in this encounter.   The patient has a good understanding of the overall plan. she agrees with it. she will call with any problems that may develop before the next visit here.   Rulon Eisenmenger, MD

## 2015-03-09 NOTE — Patient Instructions (Signed)
Pre-Operative Instructions  Congratulations, you have decided to take an important step to improving your quality of life.  You can be assured that the doctors of [facility] will be with you every step of the way.  1. Plan to be at the surgery center/hospital at least 1 (one) hour prior to your scheduled time unless otherwise directed by the surgical center/hospital staff.  You must have a responsible adult accompany you, remain during the surgery and drive you home.  Make sure you have directions to the surgical center/hospital and know how to get there on time. 2. For hospital based surgery you will need to obtain a history and physical form from your family physician within 1 month prior to the date of surgery- we will give you a form for you primary physician.  3. We make every effort to accommodate the date you request for surgery.  There are however, times where surgery dates or times have to be moved.  We will contact you as soon as possible if a change in schedule is required.   4. No Aspirin/Ibuprofen for one week before surgery.  If you are on aspirin, any non-steroidal anti-inflammatory medications (Mobic, Aleve, Ibuprofen) you should stop taking it 7 days prior to your surgery.  You make take Tylenol  For pain prior to surgery.  5. Medications- If you are taking daily heart and blood pressure medications, seizure, reflux, allergy, asthma, anxiety, pain or diabetes medications, make sure the surgery center/hospital is aware before the day of surgery so they may notify you which medications to take or avoid the day of surgery. 6. No food or drink after midnight the night before surgery unless directed otherwise by surgical center/hospital staff. 7. No alcoholic beverages 24 hours prior to surgery.  No smoking 24 hours prior to or 24 hours after surgery. 8. Wear loose pants or shorts- loose enough to fit over bandages, boots, and casts. 9. No slip on shoes, sneakers are best. 10. Bring  your boot with you to the surgery center/hospital.  Also bring crutches or a walker if your physician has prescribed it for you.  If you do not have this equipment, it will be provided for you after surgery. 11. If you have not been contracted by the surgery center/hospital by the day before your surgery, call to confirm the date and time of your surgery. 12. Leave-time from work may vary depending on the type of surgery you have.  Appropriate arrangements should be made prior to surgery with your employer. 13. Prescriptions will be provided immediately following surgery by your doctor.  Have these filled as soon as possible after surgery and take the medication as directed. 14. Remove nail polish on the operative foot. 15. Wash the night before surgery.  The night before surgery wash the foot and leg well with the antibacterial soap provided and water paying special attention to beneath the toenails and in between the toes.  Rinse thoroughly with water and dry well with a towel.  Perform this wash unless told not to do so by your physician.  Enclosed: 1 Ice pack (please put in freezer the night before surgery)   1 Hibiclens skin cleaner   Pre-op Instructions  If you have any questions regarding the instructions, do not hesitate to call our office.  [place]: [address] [place], [place] [address] [phone]  [place]: [address] [place], [place] [address] [phone]  [place]: [address]  [place], [place] [address] [phone]  Dr. Richard   Tuchman DPM, Dr. Norman Regal DPM Dr. Richard Sikora DPM, Dr. M. Todd Hyatt DPM, Dr. Kathryn Egerton DPM 

## 2015-03-10 ENCOUNTER — Telehealth: Payer: Self-pay | Admitting: *Deleted

## 2015-03-10 ENCOUNTER — Ambulatory Visit (HOSPITAL_BASED_OUTPATIENT_CLINIC_OR_DEPARTMENT_OTHER): Payer: Medicare Other | Admitting: Genetic Counselor

## 2015-03-10 ENCOUNTER — Ambulatory Visit
Admission: RE | Admit: 2015-03-10 | Discharge: 2015-03-10 | Disposition: A | Discharge status: Home / Self Care | Payer: Medicare Other | Source: Ambulatory Visit | Attending: Radiation Oncology | Admitting: Radiation Oncology

## 2015-03-10 ENCOUNTER — Other Ambulatory Visit (HOSPITAL_BASED_OUTPATIENT_CLINIC_OR_DEPARTMENT_OTHER): Payer: Medicare Other

## 2015-03-10 ENCOUNTER — Encounter: Payer: Self-pay | Admitting: Genetic Counselor

## 2015-03-10 DIAGNOSIS — C50511 Malignant neoplasm of lower-outer quadrant of right female breast: Secondary | ICD-10-CM

## 2015-03-10 DIAGNOSIS — K5790 Diverticulosis of intestine, part unspecified, without perforation or abscess without bleeding: Secondary | ICD-10-CM | POA: Diagnosis not present

## 2015-03-10 DIAGNOSIS — R011 Cardiac murmur, unspecified: Secondary | ICD-10-CM | POA: Diagnosis not present

## 2015-03-10 DIAGNOSIS — Z809 Family history of malignant neoplasm, unspecified: Secondary | ICD-10-CM

## 2015-03-10 DIAGNOSIS — Z8051 Family history of malignant neoplasm of kidney: Secondary | ICD-10-CM

## 2015-03-10 DIAGNOSIS — Z801 Family history of malignant neoplasm of trachea, bronchus and lung: Secondary | ICD-10-CM

## 2015-03-10 DIAGNOSIS — Z8042 Family history of malignant neoplasm of prostate: Secondary | ICD-10-CM

## 2015-03-10 DIAGNOSIS — Z510 Encounter for antineoplastic radiation therapy: Secondary | ICD-10-CM | POA: Diagnosis present

## 2015-03-10 DIAGNOSIS — Z803 Family history of malignant neoplasm of breast: Secondary | ICD-10-CM | POA: Diagnosis not present

## 2015-03-10 DIAGNOSIS — M797 Fibromyalgia: Secondary | ICD-10-CM | POA: Diagnosis not present

## 2015-03-10 DIAGNOSIS — Z800 Family history of malignant neoplasm of digestive organs: Secondary | ICD-10-CM | POA: Diagnosis not present

## 2015-03-10 DIAGNOSIS — I10 Essential (primary) hypertension: Secondary | ICD-10-CM | POA: Diagnosis not present

## 2015-03-10 DIAGNOSIS — F419 Anxiety disorder, unspecified: Secondary | ICD-10-CM | POA: Diagnosis not present

## 2015-03-10 LAB — COMPREHENSIVE METABOLIC PANEL (CC13)
ALBUMIN: 4.6 g/dL (ref 3.5–5.0)
ALK PHOS: 75 U/L (ref 40–150)
ALT: 14 U/L (ref 0–55)
ANION GAP: 10 meq/L (ref 3–11)
AST: 19 U/L (ref 5–34)
BILIRUBIN TOTAL: 0.6 mg/dL (ref 0.20–1.20)
BUN: 14.5 mg/dL (ref 7.0–26.0)
CO2: 28 meq/L (ref 22–29)
CREATININE: 0.8 mg/dL (ref 0.6–1.1)
Calcium: 10.2 mg/dL (ref 8.4–10.4)
Chloride: 100 mEq/L (ref 98–109)
EGFR: 85 mL/min/{1.73_m2} — AB (ref >90)
Glucose: 74 mg/dl (ref 70–140)
Potassium: 3.5 mEq/L (ref 3.5–5.1)
Sodium: 138 mEq/L (ref 136–145)
TOTAL PROTEIN: 7.9 g/dL (ref 6.4–8.3)

## 2015-03-10 LAB — CBC WITH DIFFERENTIAL/PLATELET
BASO%: 0.2 % (ref 0.0–2.0)
BASOS ABS: 0 10*3/uL (ref 0.0–0.1)
EOS ABS: 0.2 10*3/uL (ref 0.0–0.5)
EOS%: 2 % (ref 0.0–7.0)
HEMATOCRIT: 38.2 % (ref 34.8–46.6)
HEMOGLOBIN: 12.5 g/dL (ref 11.6–15.9)
LYMPH#: 2.3 10*3/uL (ref 0.9–3.3)
LYMPH%: 24.8 % (ref 14.0–49.7)
MCH: 26.5 pg (ref 25.1–34.0)
MCHC: 32.7 g/dL (ref 31.5–36.0)
MCV: 80.9 fL (ref 79.5–101.0)
MONO#: 0.5 10*3/uL (ref 0.1–0.9)
MONO%: 5.9 % (ref 0.0–14.0)
NEUT%: 67.1 % (ref 38.4–76.8)
NEUTROS ABS: 6.2 10*3/uL (ref 1.5–6.5)
NRBC: 0 % (ref 0–0)
PLATELETS: 262 10*3/uL (ref 145–400)
RBC: 4.72 10*6/uL (ref 3.70–5.45)
RDW: 14.5 % (ref 11.2–14.5)
WBC: 9.2 10*3/uL (ref 3.9–10.3)

## 2015-03-10 NOTE — Telephone Encounter (Signed)
-----   Message from Trula Slade, DPM sent at 03/09/2015  7:19 PM EDT ----- Shannon Hancock- can we send her MRI (and maybe ultrasound) out for an over read? They are both in EPIC. Dr. Geroge Baseman ordered the MRI  Delydia- I am going to schedule her for foreign body removal. Probably not this coming week but next. I told her Wed is when I operate but I think she is busy on Wed or something. She just was diagnosed with breast cancer and had surgery and will start radiation in the next couple of weeks and would like to have this done before starting that treatment. I told her I can do a Mon at 7am if needed (or even Friday).

## 2015-03-10 NOTE — Telephone Encounter (Signed)
"  I'm supposed to have surgery with Dr. Jacqualyn Posey.  I was to call back once I found out when I'm supposed to start radiation.  I start radiation not next week but the week after.  So, I'm supposed to have this surgery before then.  So, what do you have next week?"  I'm going to say 03/17/2015 will be the best.  "That's a Friday, I thought he said he does surgery on Wednesday."  He does but I think he wants Dr. Milinda Pointer to assist him.  I'll have to check with Dr. Milinda Pointer to see if that day is okay with him.  I'll let you know.  I am calling to let you know that I had told you incorrectly.  You are supposed to be scheduled for Wednesday.  So, I will get you scheduled for 03/15/2015.  "What time will I need to get there?"  The surgical will probably call you on Monday.  They will give you the exact arrival time.  Remember not to eat or drink anything after midnight the night before.  "Don't worry, I'm well aware about that.  I drunk some water before I was supposed to have surgery on my breast and had to postpone.   What about my medication, is it okay to take it?"  Ask the surgery center staff when they call you.  "Okay, thank you."  I faxed all required information to [facility] for surgery scheduled for 03/15/2015.

## 2015-03-10 NOTE — Telephone Encounter (Signed)
I called [employer] to check patient's benefits for surgery scheduled for 03/15/2015.  Di Kindle stated authorization is not needed for procedure 28020.  Effective date is 07/01/2013, $4000 max out of pocket, 100% benefits.

## 2015-03-10 NOTE — Telephone Encounter (Signed)
Left message requesting location MRI of right heel was performed.

## 2015-03-10 NOTE — Progress Notes (Signed)
REFERRING PROVIDER: Nicholas Lose, MD  OTHER PROVIDER(S): Kyung Rudd, MD  PRIMARY PROVIDER:  Vidal Schwalbe, MD  PRIMARY REASON FOR VISIT:  1. Breast cancer of lower-outer quadrant of right female breast   2. Family history of breast cancer in sister   96. Family history of prostate cancer in father   69. Family history of breast cancer in female   61. Family history of colon cancer   6. Family history of lung cancer   7. Family history of kidney cancer   8. Family history of cancer      HISTORY OF PRESENT ILLNESS:   Shannon Hancock, a 64 y.o. female, was seen for a [facility] cancer genetics consultation at the request of Dr. Lindi Adie due to a personal history of breast cancer and family history of breast, prostate, and other cancers.  Shannon Hancock presents to clinic today to discuss the possibility of a hereditary predisposition to cancer, genetic testing, and to further clarify her future cancer risks, as well as potential cancer risks for family members.   In July 2016, at the age of 54, Shannon Hancock was diagnosed with invasive ductal carcinoma with DCIS of the right breast.  Hormone receptor status was ER/PR+, Her2- with a Ki67 of 10%. This was treated with right lumpectomy and radiation.    CANCER HISTORY:  Oncology History   Breast cancer of lower-outer quadrant of right female breast   Staging form: Breast, AJCC 7th Edition     Clinical: Stage IA (T1b, N0, M0) - Unsigned     Pathologic stage from 02/13/2015: Stage IB (T1b, N60m, cM0) - Unsigned        Breast cancer of lower-outer quadrant of right female breast   01/05/2015 Imaging A 10 mm diameter enhancing mass in the right lower outer breast, otherwise negative.   01/18/2015 Mammogram Diagnostic mammo and ultrasound showed a 0.6 cm mass in the right breast 8:00 location 6 cm from the nipple, and a 0.5 cm lesion in the 10:00 location of the right breast 6 cm from the nipple. Right axilla was negative for adenopathy.    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 1 out of 2 lymph nodes were positive for micrometastasis.Oncotype 12 (8% ROR)     HORMONAL RISK FACTORS:  Menarche was at age 64  First live birth at age 64  OCP use for approximately "a few" years.  Ovaries intact: yes.  Hysterectomy: no.  Menopausal status: postmenopausal.  HRT use: approximately 3 years with estrogen ring or vaginal cream years. Colonoscopy: yes; diverticulosis, but no polyps found; most recent was in 12/2006. Mammogram within the last year: yes. Number of breast biopsies: 2. Up to date with pelvic exams:  yes. Any excessive radiation exposure in the past:  no  Past Medical History  Diagnosis Date  . Allergy   . Hypertension   . Fibromyalgia   . Diverticulosis   . Heart murmur   . Anxiety   . Breast cancer 12/2014    IDC+DCIS of right breast; ER/PR+, Her2-, ki67=10%    Past Surgical History  Procedure Laterality Date  . Tubal ligation    . Foot surgery    . Breast lumpectomy with radioactive seed and sentinel lymph node biopsy  Right 02/13/2015    Procedure: RIGHT BREAST LUMPECTOMY WITH RADIOACTIVE SEED AND SENTINEL LYMPH NODE MAPPING;  Surgeon: [name], MD;  Location: [facility];  Service: General;  Laterality: Right;    Social History   Social History  . Marital Status: Married    Spouse Name: N/A  . Number of Children: 2  . Years of Education: N/A   Occupational History  . 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 Smoking status: Former Smoker -- 0.00 packs/day for 4 years    Types: Cigarettes    Quit date: 07/01/1972  . Smokeless tobacco: Never Used      Comment: only smoked in college - up to 1 ppd  . Alcohol Use: No  . Drug Use: No  . Sexual Activity: Not Asked   Other Topics Concern  . None   Social History Narrative   Regular exercise-no   Living will: None (reviewed 2014)     FAMILY HISTORY:  We obtained a detailed, 4-generation family history.  Significant diagnoses are listed below: Family History  Problem Relation Age of Onset  . Alzheimer's disease Mother   . Stroke Mother   . Coronary artery disease Father   . Hypertension Father   . Depression Father   . Prostate cancer Father 30  . Colon polyps Father     unspecified number  . Breast cancer Sister 33  . Colon polyps Sister     3-4 total  . Hypertension Brother   . Hyperlipidemia Brother   . Prostate cancer Brother 8  . Breast cancer Maternal Aunt     dx. 71s  . Kidney cancer Cousin     dx. 20s; smoker while in college  . Lung cancer Maternal Uncle     smoker  . Cancer Paternal Aunt     unspecified type  . Cirrhosis Paternal Uncle   . Colon cancer Maternal Grandmother 49  . Cancer Maternal Grandfather     unspecified type  . Parkinson's disease Maternal Grandfather   . Kidney cancer Paternal Grandfather     dx. 17s  . Breast cancer Cousin   . Lung cancer Other     MGM's sister  . Breast cancer Other   . Lung cancer Other   . Breast cancer Paternal Aunt 57  . Congestive Heart Failure Paternal Uncle   . Diabetes Paternal Uncle     Shannon Hancock has two daughters, ages 63 and 73 who have not had any cancers.  Her youngest daughter has one son and one daughter.  Shannon Hancock has one full sister and brother.  Her sister is currently 27 and has a history of breast cancer which was diagnosed at 23.  She also has a history of about 3-4 total colon polyps upon colonoscopy.  This sister has no daughters.  Shannon Hancock brother has a history of prostate cancer diagnosed at 43; he is currently 63.   Neither his son or daughter have had cancer.  Ms.  Hancock mother died at 48 with dementia.  She never had cancer, but did have a history of a hysterectomy for an unspecified reason.  Shannon Hancock father died at 41.  He had a history of prostate cancer diagnosed at 70.  Shannon Hancock also believes he had some colon polyps upon colonoscopy, but is unsure of how many he had.  Shannon Hancock mother had two full sisters and one full brother.  One sister was diagnosed with breast cancer in her 66s.  She has one son and one daughter--neither of whom have had cancer.  The other sister died in her 68s from what Shannon Hancock termed "rough living", she had no history of cancer, but did have three sons and two daughters, one son had kidney cancer in his 13s.  Shannon Hancock maternal uncle died of lung cancer in his 43s; he was a smoker.  Shannon Hancock maternal grandmother died at 61; she had a history of colon cancer at 26.  At least one of her sisters had lung cancer--Shannon Hancock has no further information for her siblings.  Shannon Hancock maternal grandfather died at 1 with an unspecified type of cancer.  He had three sisters, two of whom had a history of breast cancer; he also had two brothers, one of whom died of lung cancer.  Shannon Hancock father had one full sister and two full brothers.  His sister died in her 80s with an unspecified type of cancer.  She had one son and one daughter; her daughter had a history of breast cancer diagnosed in her 75s-60s.  Neither of his two full brothers were diagnosed with cancer.  He also had three paternal half-brothers and three paternal half-sisters.  At least one half-sister was recently diagnosed with breast cancer at 39.  Shannon Hancock had no further information for these aunts and uncles.  Her paternal grandmother died in her late 85s with TB.  Her paternal grandfather died at 26 and had a history of what was likely a kidney cancer that was diagnosed in his 19s.    Shannon Hancock also reports a  history of multiple breast cancer diagnoses for both maternal and paternal first cousins, once-removed.  She does not believe any other family members have had genetic counseling and testing at this point in time.  Patient's maternal ancestors and paternal ancestors are of African American/Caucasian/Native American descent. There is no reported Ashkenazi Jewish ancestry. There is no known consanguinity.  GENETIC COUNSELING ASSESSMENT: Shannon Hancock FIALLOS is a 64 y.o. female with a personal and family history of cancer which is somewhat suggestive of a hereditary breast cancer syndrome and predisposition to cancer. We, therefore, discussed and recommended the following at today's visit.   DISCUSSION: We reviewed the characteristics, features and inheritance patterns of hereditary cancer syndromes, particularly those caused by mutations within the BRCA1/2 genes. We also discussed genetic testing, including the appropriate family members to test, the process of testing, insurance coverage and turn-around-time for results. We discussed the implications of a negative, positive and/or variant of uncertain significant result. We recommended Ms. Gibbon pursue genetic testing for the 20-gene Breast/Ovarian Cancer Panel through [facility] Hope Pigeon, MD).  The Breast/Ovarian gene panel offered by [facility] includes sequencing and deletion/duplication analysis for the following 19 genes:  ATM, BARD1, BRCA1, BRCA2, BRIP1, CDH1, CHEK2, FANCC, MLH1, MSH2, MSH6, NBN, PALB2, PMS2, PTEN, RAD51C, RAD51D, TP53, and XRCC2.  This panel also includes deletion/duplication analysis for one gene, EPCAM.    Based on Ms. Langton's personal and family history of cancer, she meets medical criteria for genetic testing. Despite that she meets criteria, she may still have an out of pocket cost. We discussed that if her out of pocket cost for testing is over $100, the laboratory will call and confirm whether she wants to  proceed with testing.  If the out of pocket cost of testing is less than $100 she will be billed by the genetic  testing laboratory.   PLAN: After considering the risks, benefits, and limitations, Ms. Vert  provided informed consent to pursue genetic testing and the blood sample was sent to [facility] for analysis of the 20-gene Breast/Ovarian Cancer Panel test. Results should be available within approximately 2-3 weeks' time, at which point they will be disclosed by telephone to Ms. Gragert, as will any additional recommendations warranted by these results. Ms. Richardson will receive a summary of her genetic counseling visit and a copy of her results once available. This information will also be available in Epic. We encouraged Ms. Delatte to remain in contact with cancer genetics annually so that we can continuously update the family history and inform her of any changes in cancer genetics and testing that may be of benefit for her family. Ms. Chandonnet questions were answered to her satisfaction today. Our contact information was provided should additional questions or concerns arise.  Thank you for the referral and allowing [place] to share in the care of your patient.   Jeanine Luz, MS Genetic Counselor [email or web address] phone: [phone]  The patient was seen for a total of 60 minutes in face-to-face genetic counseling.  This patient was discussed with Drs. Magrinat, Lindi Adie and/or Burr Medico who agrees with the above.    _______________________________________________________________________ For Office Staff:  Number of people involved in session: 1 Was an Intern/ student involved with case: no

## 2015-03-10 NOTE — Telephone Encounter (Signed)
Patient called concerning starting her radiation treatment  Same time as her foot surgery which is scheduled for 03/15/15, she said she thought she was told not to do at same time, will inbasket MD and call her back with status, [phone] .3:00 PM Gaspar Garbe, RN II Rad/Onc

## 2015-03-13 ENCOUNTER — Encounter: Payer: Self-pay | Admitting: *Deleted

## 2015-03-13 ENCOUNTER — Telehealth: Payer: Self-pay | Admitting: *Deleted

## 2015-03-13 NOTE — Telephone Encounter (Signed)
Returned call  To patient she can have foor surgery on 03/15/15, will not interfere with Breast Radiation, patient thanked me fro reurning call so soon 10:13 AM

## 2015-03-13 NOTE — Telephone Encounter (Signed)
"  I'm returning your call.  The surgical center will call you a day or two prior to surgery date.  They will give you your arrival time.  It will be in the morning.  "It will be in the morning, okay.  They need to go ahead and call.  I want to know what time because I have to have Radiation, I have several appointments I have to make."  You can call them and see if they can tell you.  "Okay, what's their phone number?"  It is [phone].  I hope thing goes with well with your surgery and with your Radiation treatments.  "Thank you so much."

## 2015-03-13 NOTE — Telephone Encounter (Signed)
I saw Dr. Jacqualyn Posey last week and he scheduled surgery for this Wednesday.  I have not received a time.  Someone was supposed to call me.  I am requesting that it be in the afternoon if possible, anytime around 1:30.  Please give me a call.  If you can work that time in fine, if not, I'll take whatever you have.  I have several doctor appointments.  It would entail having to reschedule another one.  Give me a call.  Hope to hear from you soon this morning.

## 2015-03-14 ENCOUNTER — Telehealth: Payer: Self-pay | Admitting: *Deleted

## 2015-03-14 ENCOUNTER — Telehealth: Payer: Self-pay

## 2015-03-14 NOTE — Progress Notes (Signed)
  Radiation Oncology         [phone]) [phone] ________________________________  Name: Shannon Hancock MRN: [id number]  Date: 03/10/2015  DOB: [birth year]  Optical Surface Tracking Plan:  Since intensity modulated radiotherapy (IMRT) and 3D conformal radiation treatment methods are predicated on accurate and precise positioning for treatment, intrafraction motion monitoring is medically necessary to ensure accurate and safe treatment delivery.  The ability to quantify intrafraction motion without excessive ionizing radiation dose can only be performed with optical surface tracking. Accordingly, surface imaging offers the opportunity to obtain 3D measurements of patient position throughout IMRT and 3D treatments without excessive radiation exposure.  I am ordering optical surface tracking for this patient's upcoming course of radiotherapy. ________________________________  Kyung Rudd, MD 03/14/2015 10:14 AM    Reference:   Ursula Alert, J, et al. Surface imaging-based analysis of intrafraction motion for breast radiotherapy patients.Journal of [place], n. 6, nov. 2014. ISSN [id number].   Available at: <http://www.jacmp.org/index.php/jacmp/article/view/4957>.

## 2015-03-14 NOTE — Progress Notes (Addendum)
  Radiation Oncology         (336) [phone] ________________________________  Name: Shannon Hancock MRN: [id number]  Date: 03/10/2015  DOB: January 17, 1951     ICD-9-CM ICD-10-CM   1. Breast cancer of lower-outer quadrant of right female breast 174.5 C50.511      SIMULATION AND TREATMENT PLANNING NOTE  The patient presented for simulation prior to beginning her course of radiation treatment for her diagnosis of Right-sided breast cancer. The patient was placed in a supine position on a breast board. A customized vac-lock bag was constructed and this complex treatment device will be used on a daily basis during her treatment. In this fashion, a CT scan was obtained through the chest area and an isocenter was placed near the chest wall within the breast.  The patient will be planned to receive a course of radiation initially to a dose of 42.5 Gy. This will consist of a whole breast radiotherapy technique. To accomplish this, 2 customized blocks have been designed which will correspond to medial and lateral whole breast tangent fields. This treatment will be accomplished at 2.5 Gy per fraction. A forward planning technique will also be evaluated to determine if this approach improves the plan. It is anticipated that the patient will then receive a 7.5 Gy boost to the seroma cavity which has been contoured. This will be accomplished at 2.5 Gy per fraction.   This initial treatment will consist of a 3-D conformal technique. The seroma has been contoured as the primary target structure. Additionally, dose volume histograms of both this target as well as the lungs and heart will also be evaluated. Such an approach is necessary to ensure that the target area is adequately covered while the nearby critical  normal structures are adequately spared.  Plan:  The final anticipated total dose therefore will correspond to 50 Gy.    _______________________________   Jodelle Gross, MD, PhD

## 2015-03-14 NOTE — Telephone Encounter (Signed)
Per request from [facility], faxed medical release for surgical procedure to Dr. Jacqualyn Posey.  Sent to scan.

## 2015-03-14 NOTE — Telephone Encounter (Signed)
That is good. Thanks

## 2015-03-14 NOTE — Telephone Encounter (Signed)
Pt states she tried on the surgical shoe she was to wear on 03/15/2015 post-operative, but doesn't know how she is to walk in it without it hurting.  I told pt the area will be extremely padded for comfort and protection, that she will only be up on the foot 5 minutes/hour for the 1st week post-op and the surgical shoe is to decrease in movement or pulling of the surgery site. Pt asked what is the shortest time she will be in the boot, I said 3-4 weeks.

## 2015-03-15 ENCOUNTER — Encounter: Payer: Self-pay | Admitting: *Deleted

## 2015-03-15 ENCOUNTER — Telehealth: Payer: Self-pay | Admitting: *Deleted

## 2015-03-15 DIAGNOSIS — Z510 Encounter for antineoplastic radiation therapy: Secondary | ICD-10-CM | POA: Diagnosis not present

## 2015-03-15 DIAGNOSIS — M795 Residual foreign body in soft tissue: Secondary | ICD-10-CM | POA: Diagnosis not present

## 2015-03-15 MED ORDER — TRAMADOL HCL 50 MG PO TABS: 50.0000 mg | ORAL_TABLET | Freq: Three times a day (TID) | ORAL | Status: DC | PRN

## 2015-03-15 MED ORDER — PROMETHAZINE HCL 12.5 MG PO TABS: 12.5000 mg | ORAL_TABLET | Freq: Four times a day (QID) | ORAL | Status: DC | PRN

## 2015-03-15 MED ORDER — PROMETHAZINE HCL 12.5 MG PO TABS
12.5000 mg | ORAL_TABLET | Freq: Four times a day (QID) | ORAL | Status: DC | PRN
Start: 2015-03-15 — End: 2015-03-15

## 2015-03-15 NOTE — Telephone Encounter (Addendum)
[facility] Prior Authorization form for Promethazine 12.5mg  faxed back as cancelled.  I spoke with pt and informed her that Vladimir Faster had the medication on their $4.00 formulary if they would not tell [facility] they had insurance.  Pt states she can use the one on [address].  Pt states she didn't get any pain medication because with her last surgery the Oxycodone kept her awake all night.  Dr. Jacqualyn Posey offered Tramadol 50mg  #30 1 tablet every 8 hours prn pain, pt agreed. I reviewed the [place] there was no [employer] in our index, so pt agreed to [employer]. Ordered Promethazine 12.5mg  #30 one tablet every 6 hour prn nausea, and Tramadol 50mg  #30 one tablet every 8 hours prn pain to [place].  Pt complained of sorethroat after surgery, was told she threw up during surgery, is this normal.  I told pt occasionally pts will throw up during surgery, and the sorethroat may be caused by, use salt water gargles for comfort.  I was listening to Nurse Voicemail after speaking with pt and she said she had taken 2 Oxycodone.  I called pt again and told her not to take the Tramadol with the Oxycodone, that she could probably take the Tramadol after 900pm.  I tried to cal the Tramadol in to the [facility] [phone] and the line was busy 3 times.  Pt states the [facility] did not have the Tramadol and she took another Oxycodone, and is itching unbearably.  Pt states this morning has taken a Xanax and Aleve.  I told pt to take the Promethazine or Benadryl for the antihistamine/anti-itch properties, pt agreed to take the Benadryl since she is not nauseous.  I told pt I would call the [facility] [address] as requested. [facility]amadol [facility] [phone] [place].

## 2015-03-15 NOTE — Telephone Encounter (Signed)
Dr. Jacqualyn Posey called and requested medical clearance letter from Dr. Lindi Adie be faxed to anesthesiologist at [facility].  I faxed the letter as requested.

## 2015-03-16 MED ORDER — TRAMADOL HCL 50 MG PO TABS: 50.0000 mg | ORAL_TABLET | Freq: Three times a day (TID) | ORAL | Status: DC | PRN

## 2015-03-17 ENCOUNTER — Ambulatory Visit
Admission: RE | Admit: 2015-03-17 | Discharge: 2015-03-17 | Disposition: A | Discharge status: Home / Self Care | Payer: Medicare Other | Source: Ambulatory Visit | Attending: Radiation Oncology | Admitting: Radiation Oncology

## 2015-03-17 DIAGNOSIS — Z510 Encounter for antineoplastic radiation therapy: Secondary | ICD-10-CM | POA: Diagnosis not present

## 2015-03-20 ENCOUNTER — Encounter: Payer: Self-pay | Admitting: Radiation Oncology

## 2015-03-20 ENCOUNTER — Ambulatory Visit
Admission: RE | Admit: 2015-03-20 | Discharge: 2015-03-20 | Disposition: A | Discharge status: Home / Self Care | Payer: Medicare Other | Source: Ambulatory Visit | Attending: Radiation Oncology | Admitting: Radiation Oncology

## 2015-03-20 ENCOUNTER — Ambulatory Visit (HOSPITAL_COMMUNITY)
Admission: RE | Admit: 2015-03-20 | Discharge: 2015-03-20 | Disposition: A | Discharge status: Home / Self Care | Payer: Medicare Other | Source: Ambulatory Visit | Attending: Radiation Oncology | Admitting: Radiation Oncology

## 2015-03-20 VITALS — BP 118/57 | HR 72 | Temp 98.8°F | Resp 20

## 2015-03-20 DIAGNOSIS — R05 Cough: Secondary | ICD-10-CM | POA: Insufficient documentation

## 2015-03-20 DIAGNOSIS — Z510 Encounter for antineoplastic radiation therapy: Secondary | ICD-10-CM | POA: Diagnosis not present

## 2015-03-20 DIAGNOSIS — J029 Acute pharyngitis, unspecified: Secondary | ICD-10-CM | POA: Insufficient documentation

## 2015-03-20 DIAGNOSIS — C50511 Malignant neoplasm of lower-outer quadrant of right female breast: Secondary | ICD-10-CM

## 2015-03-20 MED ORDER — ALRA NON-METALLIC DEODORANT (RAD-ONC)
1.0000 "application " | Freq: Once | TOPICAL | Status: AC
  Administered 2015-03-20: 1 via TOPICAL

## 2015-03-20 MED ORDER — RADIAPLEXRX EX GEL
Freq: Once | CUTANEOUS | Status: AC
  Administered 2015-03-20: 12:00:00 via TOPICAL

## 2015-03-20 NOTE — Progress Notes (Signed)
Pt education done right breast, radiati9n therapy and you book, alra and radiaplex gel skin products, my busines card given, discussed ways to manage side effects fatiue, skin irritation, swelling, pain, patient has laryngitis, stated since Surgery on breast hoarseness for 2 days, then 03/15/15 surgery on right foot woke up laryngitis, was told she threw up during that surgery,   Said she thew up bright red fluid,  Thinks she might have aspirated, will see MD today 11:25 AM BP 118/57 mmHg  Pulse 72  Temp(Src) 98.8 F (37.1 C) (Oral)  Resp 20  SpO2 100%  Wt Readings from Last 3 Encounters:  03/09/15 149 lb 6.4 oz (67.767 kg)  02/28/15 146 lb (66.225 kg)  02/27/15 145 lb 12.8 oz (66.134 kg)

## 2015-03-20 NOTE — Progress Notes (Signed)
Department of Radiation Oncology  Phone:  [phone] Fax:        [phone]  Weekly Treatment Note   Name: Shannon Hancock Date: 03/20/2015 MRN: [id number] DOB: 1950/08/09  Current dose: 2.5 Gy  Current fraction:1  MEDICATIONS: Current Outpatient Prescriptions  Medication Sig Dispense Refill  . ALPRAZolam (XANAX) 1 MG tablet TAKE 1 TABLET BY MOUTH 4 TIMES A DAY AS NEEDED FOR SLEEP OR ANXIETY 120 tablet 0  . amLODipine (NORVASC) 5 MG tablet Take 5 mg by mouth daily.    . cephALEXin (KEFLEX) 500 MG capsule Take 500 mg by mouth 3 (three) times daily.    . fexofenadine (ALLEGRA) 180 MG tablet Take 180 mg by mouth daily.    Derrill Memo ON 03/21/2015] hyaluronate sodium (RADIAPLEXRX) GEL Apply 1 application topically 2 (two) times daily.    Marland Kitchen losartan-hydrochlorothiazide (HYZAAR) 100-12.5 MG per tablet TAKE 1 TABLET BY MOUTH DAILY. 30 tablet 0  . LYRICA 100 MG capsule TAKE ONE CAPSULE TWICE A DAY 60 capsule 3  . naproxen sodium (ANAPROX) 220 MG tablet Take 220 mg by mouth as needed.    Derrill Memo ON 6/83/4196] non-metallic deodorant (ALRA) MISC Apply 1 application topically daily as needed.    Marland Kitchen omeprazole (PRILOSEC) 20 MG capsule Take 20 mg by mouth daily.    . psyllium (METAMUCIL) 58.6 % powder Take 1 packet by mouth daily.    . tizanidine (ZANAFLEX) 2 MG capsule Take 2 mg by mouth at bedtime as needed for muscle spasms.    . traMADol (ULTRAM) 50 MG tablet Take 1 tablet (50 mg total) by mouth every 8 (eight) hours as needed. 30 tablet 0  . ZETIA 10 MG tablet TAKE 1 TABLET BY MOUTH DAILY. 90 tablet 1  . meloxicam (MOBIC) 15 MG tablet     . oxyCODONE-acetaminophen (PERCOCET/ROXICET) 5-325 MG per tablet     . promethazine (PHENERGAN) 12.5 MG tablet Take 1 tablet (12.5 mg total) by mouth every 6 (six) hours as needed for nausea or vomiting. (Patient not taking: Reported on 03/20/2015) 30 tablet 0   No current facility-administered medications for this encounter.     ALLERGIES:  Olive oil   LABORATORY DATA:  Lab Results  Component Value Date   WBC 9.2 03/10/2015   HGB 12.5 03/10/2015   HCT 38.2 03/10/2015   MCV 80.9 03/10/2015   PLT 262 03/10/2015   Lab Results  Component Value Date   NA 138 03/10/2015   K 3.5 03/10/2015   CL 101 02/08/2015   CO2 28 03/10/2015   Lab Results  Component Value Date   ALT 14 03/10/2015   AST 19 03/10/2015   ALKPHOS 75 03/10/2015   BILITOT 0.60 03/10/2015     NARRATIVE: Shannon Hancock was seen today for weekly treatment management. The chart was checked and the patient's films were reviewed.  Patient education done right breast, radiation therapy and you book, alra and radiaplex gel skin products, my busines card given, discussed ways to manage side effects fatiue, skin irritation, swelling, pain, patient has laryngitis, stated since surgery on breast hoarseness for 2 days, then 03/15/15 surgery on right foot woke up laryngitis, was told she threw up during that surgery,said she threw up bright red fluid, Thinks she might have aspirated, will see MD today.   PHYSICAL EXAMINATION: oral temperature is 98.8 F (37.1 C). Her blood pressure is 118/57 and her pulse is 72. Her respiration is 20 and oxygen saturation is 100%.  General: Well-developed, in no acute distress HEENT: Normocephalic, atraumatic Cardiovascular: Regular rate and rhythm Respiratory: Clear to auscultation bilaterally Extremities: No edema present  ASSESSMENT: The patient is doing satisfactorily with treatment however she is experiencing throat hoarseness and coughing up mucous since surgery.    PLAN: We will continue with the patient's radiation treatment as planned. I will schedule a chest X-ray for today. The patient is worried about aspiration from a recent surgical procedure.  ------------------------------------------------  Jodelle Gross, MD, PhD  This document serves as a record of services personally performed by Kyung Rudd, MD.  It was created on his behalf by Derek Mound, a trained medical scribe. The creation of this record is based on the scribe's personal observations and the provider's statements to them. This document has been checked and approved by the attending provider.

## 2015-03-20 NOTE — Telephone Encounter (Signed)
called patient after showing results of CXR  To MD, told patient   CXR is normal, lungs clear, but you should call your surgeon  And notify them of your persistent laryngitis, patient thanked this RN and Dr. Lisbeth Renshaw for the return call back so soon 3:28 PM

## 2015-03-21 ENCOUNTER — Ambulatory Visit
Admission: RE | Admit: 2015-03-21 | Discharge: 2015-03-21 | Disposition: A | Discharge status: Home / Self Care | Payer: Medicare Other | Source: Ambulatory Visit | Attending: Radiation Oncology | Admitting: Radiation Oncology

## 2015-03-21 DIAGNOSIS — Z510 Encounter for antineoplastic radiation therapy: Secondary | ICD-10-CM | POA: Diagnosis not present

## 2015-03-22 ENCOUNTER — Ambulatory Visit
Admission: RE | Admit: 2015-03-22 | Discharge: 2015-03-22 | Disposition: A | Discharge status: Home / Self Care | Payer: Medicare Other | Source: Ambulatory Visit | Attending: Radiation Oncology | Admitting: Radiation Oncology

## 2015-03-22 ENCOUNTER — Telehealth: Payer: Self-pay | Admitting: *Deleted

## 2015-03-22 DIAGNOSIS — Z510 Encounter for antineoplastic radiation therapy: Secondary | ICD-10-CM | POA: Diagnosis not present

## 2015-03-22 NOTE — Progress Notes (Signed)
Surgery performed at [facility] for Excision Foreign Body right foot.  Prescription was written for Keflex 500mg , quantity 21 and Phenergan 12.5mg , quantity 30.

## 2015-03-22 NOTE — Telephone Encounter (Signed)
I attempted to call patient and see how she is doing following surgery.  I left a message that she is welcome to return my call if she likes.

## 2015-03-23 ENCOUNTER — Ambulatory Visit (INDEPENDENT_AMBULATORY_CARE_PROVIDER_SITE_OTHER): Payer: Medicare Other | Admitting: Podiatry

## 2015-03-23 ENCOUNTER — Encounter: Payer: Self-pay | Admitting: Podiatry

## 2015-03-23 ENCOUNTER — Ambulatory Visit (INDEPENDENT_AMBULATORY_CARE_PROVIDER_SITE_OTHER): Payer: Medicare Other

## 2015-03-23 ENCOUNTER — Ambulatory Visit
Admission: RE | Admit: 2015-03-23 | Discharge: 2015-03-23 | Disposition: A | Discharge status: Home / Self Care | Payer: Medicare Other | Source: Ambulatory Visit | Attending: Radiation Oncology | Admitting: Radiation Oncology

## 2015-03-23 ENCOUNTER — Encounter: Payer: Self-pay | Admitting: Radiation Oncology

## 2015-03-23 ENCOUNTER — Telehealth: Payer: Self-pay | Admitting: *Deleted

## 2015-03-23 VITALS — BP 112/70 | HR 73 | Resp 18

## 2015-03-23 DIAGNOSIS — Z9889 Other specified postprocedural states: Secondary | ICD-10-CM

## 2015-03-23 DIAGNOSIS — M795 Residual foreign body in soft tissue: Secondary | ICD-10-CM | POA: Diagnosis not present

## 2015-03-23 DIAGNOSIS — Z510 Encounter for antineoplastic radiation therapy: Secondary | ICD-10-CM | POA: Diagnosis not present

## 2015-03-23 NOTE — Telephone Encounter (Signed)
Pt states the dressing change from yesterday is irritating the back of her foot.  Left message informing pt she could make a knick in the dressing to allow for expansion or a little natural movement of the foot and to call with concerns.

## 2015-03-23 NOTE — Progress Notes (Signed)
Patient ID: Shannon Hancock, female   DOB: December 20, 1950, 65 y.o.   MRN: [id number]  DOS: 03/15/15 s/p R heel foreign body removal   Subjective: 64 year old female presents the office today one week status post foreign body removal to the right heel. She says overall she is doing well. She has been somewhat sick since the surgery and she did follow-up with her primary care physician who prescribes medication which have started to help. She's been taking the antibiotics as directed. Her pain is controlled. She denies any systemic complaints as fevers, chills, nausea, vomiting. No calf pain, chest pain, shortness of breath. No other complaints at this time.  Objective: AAO 3, NAD Neurovascular status intact and unchanged Incisional the posterior aspect of the right heel is well coapted with sutures intact. There is no tenderness palpation over on the area. There is no swelling erythema, ascending saline disc, fluctuance, crepitus, malodor, drainage. No other open lesions or pre-ulcerative lesions identified bilaterally. There is calf compression, swelling, warmth, erythema.  Assessment: 64 year old female 1 week status post foreign body removal right heel  Plan: -Treatment options discussed including all alternatives, risks, and complications -X-rays were obtained and reviewed with the patient.  -Antibiotic ointment was placed over the incision followed by dry sterile dressing. Keep the dressing clean, dry, intact. -Dispensed surgical shoe as the cam boot was hurting her hips and back. If there is any increasing pain in the surgical site to return to the boot. -Monitor for any clinical signs or symptoms of infection and directed to call the office immediately should any occur or go to the ER. -Follow-up in 1 week for likely suture removal or sooner if any problems arise. In the meantime, encouraged to call the office with any questions, concerns, change in symptoms.   Celesta Gentile, DPM

## 2015-03-23 NOTE — Progress Notes (Signed)
Form to RN for processing

## 2015-03-24 ENCOUNTER — Ambulatory Visit
Admission: RE | Admit: 2015-03-24 | Discharge: 2015-03-24 | Disposition: A | Discharge status: Home / Self Care | Payer: Medicare Other | Source: Ambulatory Visit | Attending: Radiation Oncology | Admitting: Radiation Oncology

## 2015-03-24 ENCOUNTER — Encounter: Payer: Self-pay | Admitting: Radiation Oncology

## 2015-03-24 VITALS — BP 125/62 | HR 73 | Temp 97.8°F | Resp 20 | Wt 151.0 lb

## 2015-03-24 DIAGNOSIS — Z510 Encounter for antineoplastic radiation therapy: Secondary | ICD-10-CM | POA: Diagnosis not present

## 2015-03-24 DIAGNOSIS — C50511 Malignant neoplasm of lower-outer quadrant of right female breast: Secondary | ICD-10-CM

## 2015-03-24 NOTE — Progress Notes (Signed)
Weekly rad txs 5/17 completed,  right breast slight  Pink color on breast, skin intact, , using radiaplex bid, got inhaler from her Primary Md and is a lot better, no hoarseness or phlegm s either cxr was neg,  Wt Readings from Last 3 Encounters:  03/09/15 149 lb 6.4 oz (67.767 kg)  02/28/15 146 lb (66.225 kg)  02/27/15 145 lb 12.8 oz (66.134 kg)  BP 125/62 mmHg  Pulse 73  Temp(Src) 97.8 F (36.6 C) (Oral)  Resp 20  Wt 151 lb (68.493 kg)

## 2015-03-24 NOTE — Progress Notes (Signed)
Department of Radiation Oncology  Phone:  [phone] Fax:        [phone]  Weekly Treatment Note    Name: Shannon Hancock Date: 03/24/2015 MRN: [id number] DOB: [birth year]   Current dose: 12.5 Gy  Current fraction:5   MEDICATIONS: Current Outpatient Prescriptions  Medication Sig Dispense Refill  . ALPRAZolam (XANAX) 1 MG tablet TAKE 1 TABLET BY MOUTH 4 TIMES A DAY AS NEEDED FOR SLEEP OR ANXIETY 120 tablet 0  . amLODipine (NORVASC) 5 MG tablet Take 5 mg by mouth daily.    . cephALEXin (KEFLEX) 500 MG capsule Take 500 mg by mouth 3 (three) times daily.    . fexofenadine (ALLEGRA) 180 MG tablet Take 180 mg by mouth daily.    . hyaluronate sodium (RADIAPLEXRX) GEL Apply 1 application topically 2 (two) times daily.    Marland Kitchen ipratropium (ATROVENT) 0.06 % nasal spray Place 1 spray into both nostrils 3 (three) times daily.    Marland Kitchen losartan-hydrochlorothiazide (HYZAAR) 100-12.5 MG per tablet TAKE 1 TABLET BY MOUTH DAILY. 30 tablet 0  . LYRICA 100 MG capsule TAKE ONE CAPSULE TWICE A DAY 60 capsule 3  . meloxicam (MOBIC) 15 MG tablet     . naproxen sodium (ANAPROX) 220 MG tablet Take 220 mg by mouth as needed.    . non-metallic deodorant Jethro Poling) MISC Apply 1 application topically daily as needed.    Marland Kitchen omeprazole (PRILOSEC) 20 MG capsule Take 20 mg by mouth daily.    Marland Kitchen oxyCODONE-acetaminophen (PERCOCET/ROXICET) 5-325 MG per tablet     . promethazine (PHENERGAN) 12.5 MG tablet Take 1 tablet (12.5 mg total) by mouth every 6 (six) hours as needed for nausea or vomiting. 30 tablet 0  . psyllium (METAMUCIL) 58.6 % powder Take 1 packet by mouth daily.    . tizanidine (ZANAFLEX) 2 MG capsule Take 2 mg by mouth at bedtime as needed for muscle spasms.    . traMADol (ULTRAM) 50 MG tablet Take 1 tablet (50 mg total) by mouth every 8 (eight) hours as needed. 30 tablet 0  . ZETIA 10 MG tablet TAKE 1 TABLET BY MOUTH DAILY. 90 tablet 1   No current facility-administered medications for this  encounter.     ALLERGIES: Olive oil   LABORATORY DATA:  Lab Results  Component Value Date   WBC 9.2 03/10/2015   HGB 12.5 03/10/2015   HCT 38.2 03/10/2015   MCV 80.9 03/10/2015   PLT 262 03/10/2015   Lab Results  Component Value Date   NA 138 03/10/2015   K 3.5 03/10/2015   CL 101 02/08/2015   CO2 28 03/10/2015   Lab Results  Component Value Date   ALT 14 03/10/2015   AST 19 03/10/2015   ALKPHOS 75 03/10/2015   BILITOT 0.60 03/10/2015     NARRATIVE: Shannon Hancock was seen today for weekly treatment management. The chart was checked and the patient's films were reviewed.  Weekly rad txs 5/17 completed,  right breast slight  Pink color on breast, skin intact, , using radiaplex bid, got inhaler from her Primary Md and is a lot better, no hoarseness or phlegm s either cxr was neg,  Wt Readings from Last 3 Encounters:  03/24/15 151 lb (68.493 kg)  03/09/15 149 lb 6.4 oz (67.767 kg)  02/28/15 146 lb (66.225 kg)  BP 125/62 mmHg  Pulse 73  Temp(Src) 97.8 F (36.6 C) (Oral)  Resp 20  Wt 151 lb (68.493 kg)  PHYSICAL EXAMINATION: weight is 151 lb (68.493  kg). Her oral temperature is 97.8 F (36.6 C). Her blood pressure is 125/62 and her pulse is 73. Her respiration is 20.        ASSESSMENT: The patient is doing satisfactorily with treatment.  PLAN: We will continue with the patient's radiation treatment as planned.

## 2015-03-27 ENCOUNTER — Ambulatory Visit
Admission: RE | Admit: 2015-03-27 | Discharge: 2015-03-27 | Disposition: A | Discharge status: Home / Self Care | Payer: Medicare Other | Source: Ambulatory Visit | Attending: Radiation Oncology | Admitting: Radiation Oncology

## 2015-03-27 DIAGNOSIS — Z510 Encounter for antineoplastic radiation therapy: Secondary | ICD-10-CM | POA: Diagnosis not present

## 2015-03-28 ENCOUNTER — Ambulatory Visit
Admission: RE | Admit: 2015-03-28 | Discharge: 2015-03-28 | Disposition: A | Discharge status: Home / Self Care | Payer: Medicare Other | Source: Ambulatory Visit | Attending: Radiation Oncology | Admitting: Radiation Oncology

## 2015-03-28 DIAGNOSIS — Z510 Encounter for antineoplastic radiation therapy: Secondary | ICD-10-CM | POA: Diagnosis not present

## 2015-03-29 ENCOUNTER — Ambulatory Visit
Admission: RE | Admit: 2015-03-29 | Discharge: 2015-03-29 | Disposition: A | Discharge status: Home / Self Care | Payer: Medicare Other | Source: Ambulatory Visit | Attending: Radiation Oncology | Admitting: Radiation Oncology

## 2015-03-29 DIAGNOSIS — Z510 Encounter for antineoplastic radiation therapy: Secondary | ICD-10-CM | POA: Diagnosis not present

## 2015-03-30 ENCOUNTER — Ambulatory Visit (INDEPENDENT_AMBULATORY_CARE_PROVIDER_SITE_OTHER): Payer: Medicare Other | Admitting: Podiatry

## 2015-03-30 ENCOUNTER — Ambulatory Visit
Admission: RE | Admit: 2015-03-30 | Discharge: 2015-03-30 | Disposition: A | Discharge status: Home / Self Care | Payer: Medicare Other | Source: Ambulatory Visit | Attending: Radiation Oncology | Admitting: Radiation Oncology

## 2015-03-30 ENCOUNTER — Encounter: Payer: Self-pay | Admitting: Podiatry

## 2015-03-30 VITALS — BP 113/61 | HR 78 | Resp 18

## 2015-03-30 DIAGNOSIS — M795 Residual foreign body in soft tissue: Secondary | ICD-10-CM

## 2015-03-30 DIAGNOSIS — Z9889 Other specified postprocedural states: Secondary | ICD-10-CM

## 2015-03-30 DIAGNOSIS — Z510 Encounter for antineoplastic radiation therapy: Secondary | ICD-10-CM | POA: Diagnosis not present

## 2015-03-30 NOTE — Progress Notes (Signed)
Patient ID: Shannon Hancock, female   DOB: [birth year], 64 y.o.   MRN: [id number]  DOS: 03/15/15 s/p R heel foreign body removal   Subjective: 64 year old female presents the office today 2 weeks status post foreign body removal to the right heel. She presents today for suture removal. She gets some occasional discomfort of the posterior portion of her heel still. She has remained the surgical shoe. She denies any systemic complaints as fevers, chills, nausea, vomiting. No calf pain, chest pain, shortness of breath. No other complaints at this time.  Objective: AAO 3, NAD Neurovascular status intact and unchanged Incisional the posterior aspect of the right heel is well coapted with sutures intact. There is mild tenderness to palpation over on the area. There is no surrounding erythema, ascending cellulitis, fluctuance, crepitus, malodor, drainage. There is mild tenderness over the area. No other open lesions or pre-ulcerative lesions identified bilaterally.  There is calf compression, swelling, warmth, erythema.  Assessment: 64 year old female 2 week status post foreign body removal right heel  Plan: -Treatment options discussed including all alternatives, risks, and complications -sutures removed without complications. Antibiotic ointment was placed over the incision followed by dry sterile dressing. Keep the dressing clean, dry, intact.she can remove the dressing tomorrow and start to shower as long as the incision remains coapted. At this any problems with the incision followed upon showering call the office immediately. -Continue with surgical shoe. She consented transition to a regular shoe as tolerated. -Monitor for any clinical signs or symptoms of infection and directed to call the office immediately should any occur or go to the ER. -Follow-up in 3 weeks or sooner if any problems arise. In the meantime, encouraged to call the office with any questions, concerns, change in symptoms.    Celesta Gentile, DPM

## 2015-03-31 ENCOUNTER — Telehealth: Payer: Self-pay | Admitting: *Deleted

## 2015-03-31 ENCOUNTER — Ambulatory Visit
Admission: RE | Admit: 2015-03-31 | Discharge: 2015-03-31 | Disposition: A | Discharge status: Home / Self Care | Payer: Medicare Other | Source: Ambulatory Visit | Attending: Radiation Oncology | Admitting: Radiation Oncology

## 2015-03-31 ENCOUNTER — Encounter: Payer: Self-pay | Admitting: Radiation Oncology

## 2015-03-31 VITALS — BP 117/65 | HR 69 | Temp 97.7°F | Resp 20 | Wt 151.2 lb

## 2015-03-31 DIAGNOSIS — C50511 Malignant neoplasm of lower-outer quadrant of right female breast: Secondary | ICD-10-CM

## 2015-03-31 DIAGNOSIS — Z510 Encounter for antineoplastic radiation therapy: Secondary | ICD-10-CM | POA: Diagnosis not present

## 2015-03-31 NOTE — Progress Notes (Signed)
Department of Radiation Oncology  Phone:  [phone] Fax:        [phone]  Weekly Treatment Note    Name: Shannon Hancock Date: 03/31/2015 MRN: [id number] DOB: May 10, 1951   Current dose: 25 Gy  Current fraction: 10   MEDICATIONS: Current Outpatient Prescriptions  Medication Sig Dispense Refill  . ALPRAZolam (XANAX) 1 MG tablet TAKE 1 TABLET BY MOUTH 4 TIMES A DAY AS NEEDED FOR SLEEP OR ANXIETY 120 tablet 0  . amLODipine (NORVASC) 5 MG tablet Take 5 mg by mouth daily.    . fexofenadine (ALLEGRA) 180 MG tablet Take 180 mg by mouth daily.    . hyaluronate sodium (RADIAPLEXRX) GEL Apply 1 application topically 2 (two) times daily.    Marland Kitchen ipratropium (ATROVENT) 0.06 % nasal spray Place 1 spray into both nostrils 3 (three) times daily.    Marland Kitchen losartan-hydrochlorothiazide (HYZAAR) 100-12.5 MG per tablet TAKE 1 TABLET BY MOUTH DAILY. 30 tablet 0  . LYRICA 100 MG capsule TAKE ONE CAPSULE TWICE A DAY 60 capsule 3  . meloxicam (MOBIC) 15 MG tablet     . naproxen sodium (ANAPROX) 220 MG tablet Take 220 mg by mouth as needed.    . non-metallic deodorant Jethro Poling) MISC Apply 1 application topically daily as needed.    Marland Kitchen omeprazole (PRILOSEC) 20 MG capsule Take 20 mg by mouth daily.    Marland Kitchen oxyCODONE-acetaminophen (PERCOCET/ROXICET) 5-325 MG per tablet     . tizanidine (ZANAFLEX) 2 MG capsule Take 2 mg by mouth at bedtime as needed for muscle spasms.    . traMADol (ULTRAM) 50 MG tablet Take 1 tablet (50 mg total) by mouth every 8 (eight) hours as needed. 30 tablet 0  . ZETIA 10 MG tablet TAKE 1 TABLET BY MOUTH DAILY. 90 tablet 1  . cephALEXin (KEFLEX) 500 MG capsule Take 500 mg by mouth 3 (three) times daily.    . promethazine (PHENERGAN) 12.5 MG tablet Take 1 tablet (12.5 mg total) by mouth every 6 (six) hours as needed for nausea or vomiting. (Patient not taking: Reported on 03/31/2015) 30 tablet 0  . psyllium (METAMUCIL) 58.6 % powder Take 1 packet by mouth daily.     No current  facility-administered medications for this encounter.     ALLERGIES: Olive oil   LABORATORY DATA:  Lab Results  Component Value Date   WBC 9.2 03/10/2015   HGB 12.5 03/10/2015   HCT 38.2 03/10/2015   MCV 80.9 03/10/2015   PLT 262 03/10/2015   Lab Results  Component Value Date   NA 138 03/10/2015   K 3.5 03/10/2015   CL 101 02/08/2015   CO2 28 03/10/2015   Lab Results  Component Value Date   ALT 14 03/10/2015   AST 19 03/10/2015   ALKPHOS 75 03/10/2015   BILITOT 0.60 03/10/2015     NARRATIVE: Shannon Hancock was seen today for weekly treatment management. The chart was checked and the patient's films were reviewed.  Weekly rad txs right breast 10/17 completed  Dry skin on breast where incision, was, using radiaplex bid,  Appetite not good, drinking plenty water, mild fatigue. Requesting nutrition consult ,requesting massage therapy consent  10:47 AM BP 117/65 mmHg  Pulse 69  Temp(Src) 97.7 F (36.5 C) (Oral)  Resp 20  Wt 151 lb 3.2 oz (68.584 kg)  Wt Readings from Last 3 Encounters:  03/31/15 151 lb 3.2 oz (68.584 kg)  03/24/15 151 lb (68.493 kg)  03/09/15 149 lb 6.4 oz (67.767 kg)  PHYSICAL EXAMINATION: weight is 151 lb 3.2 oz (68.584 kg). Her oral temperature is 97.7 F (36.5 C). Her blood pressure is 117/65 and her pulse is 69. Her respiration is 20.      The skin shows mild hyperpigmentation  ASSESSMENT: The patient is doing satisfactorily with treatment.  PLAN: We will continue with the patient's radiation treatment as planned.

## 2015-03-31 NOTE — Telephone Encounter (Signed)
Spoke to pt concerning needs after beginning of xrt. Denies needs or complaints at this time. Encourage pt to call with needs or questions. Received verbal understanding.

## 2015-03-31 NOTE — Addendum Note (Signed)
Encounter addended by: Kyung Rudd, MD on: 03/31/2015 11:51 AM<BR>     Documentation filed: Notes Section

## 2015-03-31 NOTE — Progress Notes (Addendum)
Weekly rad txs right breast 10/17 completed  Dry skin on breast where incision, was, using radiaplex bid,  Appetite not good, drinking plenty water, mild fatigue. Requesting nutrition consult ,requesting massage therapy consent  10:30 AM BP 117/65 mmHg  Pulse 69  Temp(Src) 97.7 F (36.5 C) (Oral)  Resp 20  Wt 151 lb 3.2 oz (68.584 kg)  Wt Readings from Last 3 Encounters:  03/31/15 151 lb 3.2 oz (68.584 kg)  03/24/15 151 lb (68.493 kg)  03/09/15 149 lb 6.4 oz (67.767 kg)

## 2015-03-31 NOTE — Telephone Encounter (Signed)
CALLED PATIENT TO INFORM OF NUTRITION APPT. ON 04-03-15 @ 9:45 AM WITH BARBARA NEFF, SPOKE WITH PATIENT AND SHE IS AWARE OF THIS APPT.

## 2015-04-03 ENCOUNTER — Encounter: Payer: Medicare Other | Admitting: Nutrition

## 2015-04-03 ENCOUNTER — Ambulatory Visit
Admission: RE | Admit: 2015-04-03 | Discharge: 2015-04-03 | Disposition: A | Discharge status: Home / Self Care | Payer: Medicare Other | Source: Ambulatory Visit | Attending: Radiation Oncology | Admitting: Radiation Oncology

## 2015-04-03 ENCOUNTER — Encounter: Payer: Self-pay | Admitting: Nutrition

## 2015-04-03 DIAGNOSIS — Z510 Encounter for antineoplastic radiation therapy: Secondary | ICD-10-CM | POA: Diagnosis not present

## 2015-04-03 NOTE — Progress Notes (Signed)
Patient arrived 45 minutes late for nutrition appointment.   I rescheduled her for tomorrow morning at 9:00 am before radiation treatment. Patient agreeable.

## 2015-04-04 ENCOUNTER — Ambulatory Visit
Admission: RE | Admit: 2015-04-04 | Discharge: 2015-04-04 | Disposition: A | Discharge status: Home / Self Care | Payer: Medicare Other | Source: Ambulatory Visit | Attending: Radiation Oncology | Admitting: Radiation Oncology

## 2015-04-04 ENCOUNTER — Ambulatory Visit: Payer: Medicare Other | Admitting: Nutrition

## 2015-04-04 DIAGNOSIS — Z510 Encounter for antineoplastic radiation therapy: Secondary | ICD-10-CM | POA: Diagnosis not present

## 2015-04-04 NOTE — Progress Notes (Signed)
64 year old female diagnosed with breast cancer.  Past medical history includes hypertension, diverticulosis, and anxiety.  Medications include Xanax, Prilosec, Phenergan, and Metamucil.  Labs were reviewed.  Height: 64 inches. Weight: 151.2 pounds. BMI: 25.94.  Noted patient has allergy to olive oil. Patient states she does not eat pork. Patient requesting nutrition consult because she would like sample menus. Patient weight is stable.   Patient reports history of bloating.  M.D. prescribed probiotics.  Nutrition diagnosis:  Food and nutrition related knowledge deficit related to breast cancer and associated treatments as evidenced by no prior need for nutrition related information.  Intervention:  Educated patient on healthy plant-based diet with increased fruits and vegetables and lean protein sources. Provided education fact sheets. Provided a fact sheet on probiotics. Educated patient about calorie control and appropriate portion sizes.  Provided sample menus for patient. Referred [facility] to [facility] since patient wants ongoing weight loss counseling.  Monitoring, evaluation, goals: Patient will incorporate more fruits vegetables and plant based foods to reduce recurrence of breast cancer.  Nutrition diagnosis resolved.  **Disclaimer: This note was dictated with voice recognition software. Similar sounding words can inadvertently be transcribed and this note may contain transcription errors which may not have been corrected upon publication of note.**

## 2015-04-05 ENCOUNTER — Ambulatory Visit: Payer: Medicare Other | Admitting: Radiation Oncology

## 2015-04-05 ENCOUNTER — Encounter: Payer: Self-pay | Admitting: Radiation Oncology

## 2015-04-05 ENCOUNTER — Ambulatory Visit
Admission: RE | Admit: 2015-04-05 | Discharge: 2015-04-05 | Disposition: A | Discharge status: Home / Self Care | Payer: Medicare Other | Source: Ambulatory Visit | Attending: Radiation Oncology | Admitting: Radiation Oncology

## 2015-04-05 VITALS — BP 125/70 | HR 68 | Temp 97.8°F | Ht 64.0 in | Wt 149.8 lb

## 2015-04-05 DIAGNOSIS — C50511 Malignant neoplasm of lower-outer quadrant of right female breast: Secondary | ICD-10-CM

## 2015-04-05 DIAGNOSIS — Z510 Encounter for antineoplastic radiation therapy: Secondary | ICD-10-CM | POA: Diagnosis not present

## 2015-04-05 NOTE — Progress Notes (Signed)
Shannon Hancock has completed 13 fractions to her right breast.  She denies pain in her breast but does report pain in her right foot.  She denies fatigue.  The skin on her right breast has slight hyperpigmentation.  She has a small pimple on the upper portion of her right breast.  She is using radiaplex gel.  BP 125/70 mmHg  Pulse 68  Temp(Src) 97.8 F (36.6 C) (Oral)  Ht 5\' 4"  (1.626 m)  Wt 149 lb 12.8 oz (67.949 kg)  BMI 25.70 kg/m2

## 2015-04-05 NOTE — Progress Notes (Signed)
All forms returned to patient and copies to be faxed.

## 2015-04-05 NOTE — Progress Notes (Signed)
Department of Radiation Oncology  Phone:  [phone] Fax:        [phone]  Weekly Treatment Note    Name: Shannon Hancock Date: 04/05/2015 MRN: [id number] DOB: [birth year]   Current dose: 32.5 Gy  Current fraction:13   MEDICATIONS: Current Outpatient Prescriptions  Medication Sig Dispense Refill  . acetaminophen (TYLENOL) 325 MG tablet Take 650 mg by mouth every 6 (six) hours as needed.    . ALPRAZolam (XANAX) 1 MG tablet TAKE 1 TABLET BY MOUTH 4 TIMES A DAY AS NEEDED FOR SLEEP OR ANXIETY 120 tablet 0  . amLODipine (NORVASC) 5 MG tablet Take 5 mg by mouth daily.    . fexofenadine (ALLEGRA) 180 MG tablet Take 180 mg by mouth daily.    . hyaluronate sodium (RADIAPLEXRX) GEL Apply 1 application topically 2 (two) times daily.    Marland Kitchen ipratropium (ATROVENT) 0.06 % nasal spray Place 1 spray into both nostrils 3 (three) times daily.    . Lactobacillus (PROBIOTIC ACIDOPHILUS PO) Take by mouth.    . losartan-hydrochlorothiazide (HYZAAR) 100-12.5 MG per tablet TAKE 1 TABLET BY MOUTH DAILY. 30 tablet 0  . LYRICA 100 MG capsule TAKE ONE CAPSULE TWICE A DAY 60 capsule 3  . meloxicam (MOBIC) 15 MG tablet     . naproxen sodium (ANAPROX) 220 MG tablet Take 220 mg by mouth as needed.    . non-metallic deodorant Jethro Poling) MISC Apply 1 application topically daily as needed.    . psyllium (METAMUCIL) 58.6 % powder Take 1 packet by mouth daily.    . simethicone (MYLICON) 941 MG chewable tablet Chew 125 mg by mouth every 6 (six) hours as needed for flatulence.    . tizanidine (ZANAFLEX) 2 MG capsule Take 2 mg by mouth at bedtime as needed for muscle spasms.    . traMADol (ULTRAM) 50 MG tablet Take 1 tablet (50 mg total) by mouth every 8 (eight) hours as needed. 30 tablet 0  . ZETIA 10 MG tablet TAKE 1 TABLET BY MOUTH DAILY. 90 tablet 1  . omeprazole (PRILOSEC) 20 MG capsule Take 20 mg by mouth daily.    Marland Kitchen oxyCODONE-acetaminophen (PERCOCET/ROXICET) 5-325 MG per tablet     . promethazine  (PHENERGAN) 12.5 MG tablet Take 1 tablet (12.5 mg total) by mouth every 6 (six) hours as needed for nausea or vomiting. (Patient not taking: Reported on 03/31/2015) 30 tablet 0   No current facility-administered medications for this encounter.     ALLERGIES: Olive oil and Oxycodone   LABORATORY DATA:  Lab Results  Component Value Date   WBC 9.2 03/10/2015   HGB 12.5 03/10/2015   HCT 38.2 03/10/2015   MCV 80.9 03/10/2015   PLT 262 03/10/2015   Lab Results  Component Value Date   NA 138 03/10/2015   K 3.5 03/10/2015   CL 101 02/08/2015   CO2 28 03/10/2015   Lab Results  Component Value Date   ALT 14 03/10/2015   AST 19 03/10/2015   ALKPHOS 75 03/10/2015   BILITOT 0.60 03/10/2015     NARRATIVE: Shannon Hancock was seen today for weekly treatment management. The chart was checked and the patient's films were reviewed.  Shannon Hancock has completed 13 fractions to her right breast. She denies pain in her breast but does report pain in her right foot. She denies fatigue. The skin on her right breast has slight hyperpigmentation. She has a small pimple on the upper portion of her right breast. She is using radiaplex gel.  PHYSICAL EXAMINATION: height is 5\' 4"  (1.626 m) and weight is 149 lb 12.8 oz (67.949 kg). Her oral temperature is 97.8 F (36.6 C). Her blood pressure is 125/70 and her pulse is 68.        ASSESSMENT: The patient is doing satisfactorily with treatment.  PLAN: We will continue with the patient's radiation treatment as planned.   ------------------------------------------------  Jodelle Gross, MD, PhD  This document serves as a record of services personally performed by Kyung Rudd, MD. It was created on his behalf by Derek Mound, a trained medical scribe. The creation of this record is based on the scribe's personal observations and the provider's statements to them. This document has been checked and approved by the attending provider.

## 2015-04-06 ENCOUNTER — Ambulatory Visit
Admission: RE | Admit: 2015-04-06 | Discharge: 2015-04-06 | Disposition: A | Discharge status: Home / Self Care | Payer: Medicare Other | Source: Ambulatory Visit | Attending: Radiation Oncology | Admitting: Radiation Oncology

## 2015-04-06 ENCOUNTER — Telehealth: Payer: Self-pay | Admitting: *Deleted

## 2015-04-06 ENCOUNTER — Encounter: Payer: Self-pay | Admitting: Podiatry

## 2015-04-06 ENCOUNTER — Ambulatory Visit (INDEPENDENT_AMBULATORY_CARE_PROVIDER_SITE_OTHER): Payer: Medicare Other | Admitting: Podiatry

## 2015-04-06 VITALS — BP 113/69 | HR 84 | Resp 18

## 2015-04-06 DIAGNOSIS — M795 Residual foreign body in soft tissue: Secondary | ICD-10-CM

## 2015-04-06 DIAGNOSIS — I82401 Acute embolism and thrombosis of unspecified deep veins of right lower extremity: Secondary | ICD-10-CM

## 2015-04-06 DIAGNOSIS — Z510 Encounter for antineoplastic radiation therapy: Secondary | ICD-10-CM | POA: Diagnosis not present

## 2015-04-06 DIAGNOSIS — Z9889 Other specified postprocedural states: Secondary | ICD-10-CM

## 2015-04-06 DIAGNOSIS — M79669 Pain in unspecified lower leg: Secondary | ICD-10-CM

## 2015-04-06 NOTE — Progress Notes (Signed)
Patient ID: Shannon Hancock, female   DOB: 23-Feb-1951, 64 y.o.   MRN: [id number]  DOS: 03/15/15 s/p R heel foreign body removal   Subjective: 64 year old female presents the office today  status post foreign body removal to the right heel. She presents today stating that she's having some burning pain to her heel on the bottom aspect. She is also getting some pain and tightness in her calf and his been ongoing for the last couple of days. She occasionally gets a cramp in her calf. She does that since these pain started she did go back into the cam boot and the symptoms have decreased. She denies any recent injury or trauma. Denies any redness over the surgical site or any drainage from the incision. She does continue to wrap her foot and Ace bandage daily for swelling. No other complaints at this time. She denies any systemic complaints such as fevers, chills, nausea, vomiting. No calf pain, chest pain, shortness of breath. No other complaints at this time.  Objective: AAO 3, NAD Neurovascular status intact and unchanged Incision on the posterior aspect of the right heel is well coapted. There is a small amount of hyperkeratotic tissue overlying the posterior aspect of the heel which was present prior to surgery. There is no swelling erythema, ascending cellulitis, fluctuance, crepitus, malodor/purulence. There is no area pinpoint bony tenderness or pain the vibratory sensation. There is some mild discomfort along the plantar aspect of the calcaneus near the insertion the plantar fascia. The plantar fascia appears to be intact. There is mild edema over surgical site. No other areas of tenderness to bilateral lower extremity is. No other open lesions or pre-ulcerative lesions. There is no pivot pain with calf compression, swelling, warmth, erythema although she does have subjective cramping and pain to her cast.  Assessment: 64 year old female status post foreign body removal right heel; with new calf  pain, burning pain to her heel.  Plan: -Treatment options discussed including all alternatives, risks, and complications -Given the new onset calf pain and cramping with her history of recent surgery and breast cancer she is at high risk for DVT. We'll obtain a venous duplex to rule out blood clot. I recommend her to get this today however she states that she cannot do this. She will get it done tomorrow. This was ordered. -Stretching exercises for Achilles tendinitis and plantar fasciitis were discussed with her. Ice to the area. Continue with compression. She can wear the cam boot as needed however transition to a regular shoe as tolerated. -Monitor for any clinical signs or symptoms of infection and directed to call the office immediately should any occur or go to the ER. -Follow-up in 2 weeks or sooner if any problems arise. In the meantime, encouraged to call the office with any questions, concerns, change in symptoms.   Celesta Gentile, DPM

## 2015-04-06 NOTE — Telephone Encounter (Addendum)
Pt states Dr. Jacqualyn Posey wanted her to have a ultrasound on her leg.  I spoke with Dr. Jacqualyn Posey and he states pt is having pain and swelling in the right surgery leg and has a hx of cancer, he stated pt said she could not do the doppler today, but will do tomorrow.  I told pt I would put in the orders and she would be able to call for the appt. Faxed orders to [facility].

## 2015-04-07 ENCOUNTER — Telehealth: Payer: Self-pay | Admitting: Genetic Counselor

## 2015-04-07 ENCOUNTER — Ambulatory Visit
Admission: RE | Admit: 2015-04-07 | Discharge: 2015-04-07 | Disposition: A | Discharge status: Home / Self Care | Payer: Medicare Other | Source: Ambulatory Visit | Attending: Radiation Oncology | Admitting: Radiation Oncology

## 2015-04-07 ENCOUNTER — Ambulatory Visit: Payer: Self-pay | Admitting: Genetic Counselor

## 2015-04-07 ENCOUNTER — Telehealth: Payer: Self-pay | Admitting: *Deleted

## 2015-04-07 ENCOUNTER — Inpatient Hospital Stay
Admission: RE | Admit: 2015-04-07 | Discharge: 2015-04-07 | Disposition: A | Discharge status: Home / Self Care | Payer: Self-pay | Source: Ambulatory Visit | Attending: Radiation Oncology | Admitting: Radiation Oncology

## 2015-04-07 DIAGNOSIS — Z510 Encounter for antineoplastic radiation therapy: Secondary | ICD-10-CM | POA: Diagnosis not present

## 2015-04-07 NOTE — Telephone Encounter (Signed)
Discussed with Shannon Hancock that her genetic testing was negative for pathogenic mutations within any of 20 genes that would cause her to be at an increased risk for breast or other related cancers.  Additionally, no uncertain changes were found.  We discussed that this result does not necessarily rule out a genetic cause for the family history of breast cancers, thus, her sister would still be eligible for genetic testing if interested.  Because the cancers in the family were all diagnosed at later ages in life, it could also be the case that these are sporadic cancers.  Additionally, we might not be testing the right genes at this time, so cannot totally rule out a genetic cause of Shannon Hancock's breast cancer.  We discussed that women in the family are still considered to be at an increased risk for breast cancer based on the history.  Thus, Shannon Hancock's two daughters should begin mammogram screening by the age of 27 or earlier as directed by their primary doctors.  Shannon Hancock is welcome to call me with any further questions.

## 2015-04-07 NOTE — Telephone Encounter (Signed)
Called [facility] - Jeanette Caprice states she did not see final results of the lower extremity doppler right leg, but would check with the technician that performed it, they would know result.  Jeanette Caprice states the technician said the result was normal and would be sent to our office once finalized.  Informed Dr. Jacqualyn Posey.

## 2015-04-10 ENCOUNTER — Ambulatory Visit: Payer: Medicare Other | Admitting: Radiation Oncology

## 2015-04-10 ENCOUNTER — Ambulatory Visit
Admission: RE | Admit: 2015-04-10 | Discharge: 2015-04-10 | Disposition: A | Discharge status: Home / Self Care | Payer: Medicare Other | Source: Ambulatory Visit | Attending: Radiation Oncology | Admitting: Radiation Oncology

## 2015-04-10 DIAGNOSIS — Z510 Encounter for antineoplastic radiation therapy: Secondary | ICD-10-CM | POA: Diagnosis not present

## 2015-04-11 ENCOUNTER — Ambulatory Visit
Admission: RE | Admit: 2015-04-11 | Discharge: 2015-04-11 | Disposition: A | Discharge status: Home / Self Care | Payer: Medicare Other | Source: Ambulatory Visit | Attending: Radiation Oncology | Admitting: Radiation Oncology

## 2015-04-11 DIAGNOSIS — Z510 Encounter for antineoplastic radiation therapy: Secondary | ICD-10-CM | POA: Diagnosis not present

## 2015-04-12 ENCOUNTER — Ambulatory Visit
Admission: RE | Admit: 2015-04-12 | Discharge: 2015-04-12 | Disposition: A | Discharge status: Home / Self Care | Payer: Medicare Other | Source: Ambulatory Visit | Attending: Radiation Oncology | Admitting: Radiation Oncology

## 2015-04-12 ENCOUNTER — Ambulatory Visit: Payer: Medicare Other | Admitting: Radiation Oncology

## 2015-04-12 DIAGNOSIS — Z510 Encounter for antineoplastic radiation therapy: Secondary | ICD-10-CM | POA: Diagnosis not present

## 2015-04-13 ENCOUNTER — Other Ambulatory Visit: Payer: Self-pay | Admitting: Adult Health

## 2015-04-13 ENCOUNTER — Ambulatory Visit
Admission: RE | Admit: 2015-04-13 | Discharge: 2015-04-13 | Disposition: A | Discharge status: Home / Self Care | Payer: Medicare Other | Source: Ambulatory Visit | Attending: Radiation Oncology | Admitting: Radiation Oncology

## 2015-04-13 DIAGNOSIS — Z510 Encounter for antineoplastic radiation therapy: Secondary | ICD-10-CM | POA: Diagnosis not present

## 2015-04-13 DIAGNOSIS — C50511 Malignant neoplasm of lower-outer quadrant of right female breast: Secondary | ICD-10-CM

## 2015-04-14 ENCOUNTER — Ambulatory Visit
Admission: RE | Admit: 2015-04-14 | Discharge: 2015-04-14 | Disposition: A | Discharge status: Home / Self Care | Payer: Medicare Other | Source: Ambulatory Visit | Attending: Radiation Oncology | Admitting: Radiation Oncology

## 2015-04-14 ENCOUNTER — Telehealth: Payer: Self-pay | Admitting: Hematology and Oncology

## 2015-04-14 ENCOUNTER — Telehealth: Payer: Self-pay

## 2015-04-14 ENCOUNTER — Encounter: Payer: Self-pay | Admitting: Radiation Oncology

## 2015-04-14 ENCOUNTER — Other Ambulatory Visit: Payer: Self-pay | Admitting: *Deleted

## 2015-04-14 VITALS — BP 118/65 | HR 60 | Temp 98.0°F | Wt 149.3 lb

## 2015-04-14 DIAGNOSIS — Z510 Encounter for antineoplastic radiation therapy: Secondary | ICD-10-CM | POA: Diagnosis not present

## 2015-04-14 DIAGNOSIS — C50511 Malignant neoplasm of lower-outer quadrant of right female breast: Secondary | ICD-10-CM

## 2015-04-14 NOTE — Progress Notes (Signed)
BP 118/65 mmHg  Pulse 60  Temp(Src) 98 F (36.7 C)  Wt 149 lb 4.8 oz (67.722 kg)  Patient has completed 20 of 20 treatments to right breast.Skin mildly discolored.given one month follow up extended out as patient is going on a cruise.Continue application of radiaplex.

## 2015-04-14 NOTE — Progress Notes (Signed)
Department of Radiation Oncology  Phone:  [phone] Fax:        [phone]  Weekly Treatment Note    Name: Shannon Hancock Date: 04/14/2015 MRN: [id number] DOB: [birth year]   Current dose: 50 Gy  Current fraction:20   MEDICATIONS: Current Outpatient Prescriptions  Medication Sig Dispense Refill  . acetaminophen (TYLENOL) 325 MG tablet Take 650 mg by mouth every 6 (six) hours as needed.    . ALPRAZolam (XANAX) 1 MG tablet TAKE 1 TABLET BY MOUTH 4 TIMES A DAY AS NEEDED FOR SLEEP OR ANXIETY 120 tablet 0  . amLODipine (NORVASC) 5 MG tablet Take 5 mg by mouth daily.    . fexofenadine (ALLEGRA) 180 MG tablet Take 180 mg by mouth daily.    . hyaluronate sodium (RADIAPLEXRX) GEL Apply 1 application topically 2 (two) times daily.    Marland Kitchen ipratropium (ATROVENT) 0.06 % nasal spray Place 1 spray into both nostrils 3 (three) times daily.    . Lactobacillus (PROBIOTIC ACIDOPHILUS PO) Take by mouth.    . losartan-hydrochlorothiazide (HYZAAR) 100-12.5 MG per tablet TAKE 1 TABLET BY MOUTH DAILY. 30 tablet 0  . LYRICA 100 MG capsule TAKE ONE CAPSULE TWICE A DAY 60 capsule 3  . meloxicam (MOBIC) 15 MG tablet     . naproxen sodium (ANAPROX) 220 MG tablet Take 220 mg by mouth as needed.    . non-metallic deodorant Jethro Poling) MISC Apply 1 application topically daily as needed.    Marland Kitchen omeprazole (PRILOSEC) 20 MG capsule Take 20 mg by mouth daily.    Marland Kitchen oxyCODONE-acetaminophen (PERCOCET/ROXICET) 5-325 MG per tablet     . promethazine (PHENERGAN) 12.5 MG tablet Take 1 tablet (12.5 mg total) by mouth every 6 (six) hours as needed for nausea or vomiting. (Patient not taking: Reported on 03/31/2015) 30 tablet 0  . psyllium (METAMUCIL) 58.6 % powder Take 1 packet by mouth daily.    . simethicone (MYLICON) 657 MG chewable tablet Chew 125 mg by mouth every 6 (six) hours as needed for flatulence.    . tizanidine (ZANAFLEX) 2 MG capsule Take 2 mg by mouth at bedtime as needed for muscle spasms.    . traMADol  (ULTRAM) 50 MG tablet Take 1 tablet (50 mg total) by mouth every 8 (eight) hours as needed. 30 tablet 0  . ZETIA 10 MG tablet TAKE 1 TABLET BY MOUTH DAILY. 90 tablet 1   No current facility-administered medications for this encounter.     ALLERGIES: Olive oil and Oxycodone   LABORATORY DATA:  Lab Results  Component Value Date   WBC 9.2 03/10/2015   HGB 12.5 03/10/2015   HCT 38.2 03/10/2015   MCV 80.9 03/10/2015   PLT 262 03/10/2015   Lab Results  Component Value Date   NA 138 03/10/2015   K 3.5 03/10/2015   CL 101 02/08/2015   CO2 28 03/10/2015   Lab Results  Component Value Date   ALT 14 03/10/2015   AST 19 03/10/2015   ALKPHOS 75 03/10/2015   BILITOT 0.60 03/10/2015     NARRATIVE: Shannon Hancock was seen today for weekly treatment management. The chart was checked and the patient's films were reviewed.   BP 118/65 mmHg  Pulse 60  Temp(Src) 98 F (36.7 C)  Wt 149 lb 4.8 oz (67.722 kg)  Patient has completed 20 of 20 treatments to right breast.Skin mildly discolored.given one month follow up extended out as patient is going on a cruise.Continue application of radiaplex.  PHYSICAL EXAMINATION: weight is  149 lb 4.8 oz (67.722 kg). Her temperature is 98 F (36.7 C). Her blood pressure is 118/65 and her pulse is 60.      The patient's skin shows some hyperpigmentation in the treatment area  ASSESSMENT: The patient did satisfactorily with treatment.  PLAN: The patient will follow-up in our clinic in 1 month.

## 2015-04-14 NOTE — Progress Notes (Signed)
GENETIC TEST RESULTS  HPI: Ms. Liska was previously seen in the [facility] due to a personal and family history of breast cancer and concerns regarding a hereditary predisposition to cancer. Please refer to our prior cancer genetics clinic note from March 10, 2015 for more information regarding Ms. Antigua's medical, social and family histories, and our assessment and recommendations, at the time. Ms. Checketts recent genetic test results were disclosed to her, as were recommendations warranted by these results. These results and recommendations are discussed in more detail below.  GENETIC TEST RESULTS: At the time of Ms. Jarrett's visit, we recommended she pursue genetic testing of the 20-gene Breast/Ovarian Cancer Panel through [facility] Hope Pigeon, MD).  The Breast/Ovarian Cancer Panel offered by [facility] includes sequencing and deletion/duplication analysis for the following 19 genes:  ATM, BARD1, BRCA1, BRCA2, BRIP1, CDH1, CHEK2, FANCC, MLH1, MSH2, MSH6, NBN, PALB2, PMS2, PTEN, RAD51C, RAD51D, TP53, and XRCC2.  This panel also includes deletion/duplication analysis (without sequencing) for one gene, EPCAM.  Those results are now back, the report date for which is April 05, 2015.  Genetic testing was normal, and did not reveal a deleterious mutation in these genes.  Additionally, no variants of uncertain significance (VUSs) were found.  The test report will be scanned into EPIC and will be located under the Results Review tab in the Pathology>Molecular Pathology section.   We discussed with Ms. Vanleeuwen that since the current genetic testing is not perfect, it is possible there may be a gene mutation in one of these genes that current testing cannot detect, but that chance is small. We also discussed, that it is possible that another gene that has not yet been discovered, or that we have not yet tested, is responsible for the cancer diagnoses in the  family, and it is, therefore, important to remain in touch with cancer genetics in the future so that we can continue to offer Ms. Bruington the most up to date genetic testing.    CANCER SCREENING RECOMMENDATIONS: We still do not have an explanation for the personal and family history of cancer.  It could be that this result is reassuring and indicates that Ms. Wileman likely does not have an increased risk for a future cancer due to a mutation in one of these genes.  Indeed, most of the cancers in the family were diagnosed at later ages in life.  This normal test also suggests that Ms. Proctor's cancer was most likely not due to an inherited predisposition associated with one of these genes.  Most cancers happen by chance and this negative test suggests that her cancer falls into this category.  However, this test result also does not necessarily rule out a genetic cause for Ms. Gerwig's breast cancer, as we might not be testing the correct genes at this point in time.  Additionally, there could be a genetic cause for the family history of cancer, that perhaps Ms. Fahringer did not inherit.  Thus, her result cannot tell us anything about her sister's genetic risks, for instance.  We, therefore, recommended she continue to follow the cancer management and screening guidelines provided by her oncology and primary healthcare providers.    RECOMMENDATIONS FOR FAMILY MEMBERS: Women in this family might be at some increased risk of developing cancer, over the general population risk, simply due to the family history of cancer. We recommended women in this family have a yearly mammogram beginning at age 28, or 101 years younger than the earliest  onset of cancer, an an annual clinical breast exam, and perform monthly breast self-exams. Ms. Bolding daughters should begin mammogram screening by the age of 65 or earlier, as directed by their primary care providers.  Women in this family should also have  a gynecological exam as recommended by their primary provider. All family members should have a colonoscopy by age 81.  Based on Ms. Dumire's family history, we recommended her sister, who was diagnosed with breast cancer at age 37, have genetic counseling and testing. Ms. Wojnar will let us know if we can be of any assistance in coordinating genetic counseling and/or testing for this family member.   FOLLOW-UP: Lastly, we discussed with Ms. Pavlock that cancer genetics is a rapidly advancing field and it is possible that new genetic tests will be appropriate for her and/or her family members in the future. We encouraged her to remain in contact with cancer genetics on an annual basis so we can update her personal and family histories and let her know of advances in cancer genetics that may benefit this family.   Our contact number was provided. Ms. Scales questions were answered to her satisfaction, and she knows she is welcome to call us at anytime with additional questions or concerns.   Jeanine Luz, MS Genetic Counselor [email or web address] Phone: [phone]

## 2015-04-14 NOTE — Telephone Encounter (Signed)
s.w. pt and advised on OCT appt....pt ok and aware °

## 2015-04-14 NOTE — Progress Notes (Signed)
  Radiation Oncology         [phone] ________________________________  Name: Shannon Hancock MRN: [id number]  Date: 03/24/2015  DOB: March 16, 1951  Complex simulation note  The patient has undergone complex simulation for her upcoming boost treatment for her diagnosis of breast cancer. The patient has initially been planned to receive 42.5 Gy. The patient will now receive a 7.5 Gy boost to the seroma cavity which has been contoured. This will be accomplished using an en face electron field. Based on the depth of the target area, 15 MeV electrons will be used. The patient's final total dose therefore will be 50 Gy. A special port plan is requested for the boost treatment.   _______________________________  Jodelle Gross, MD, PhD

## 2015-04-17 ENCOUNTER — Telehealth: Payer: Self-pay | Admitting: *Deleted

## 2015-04-17 DIAGNOSIS — C50511 Malignant neoplasm of lower-outer quadrant of right female breast: Secondary | ICD-10-CM

## 2015-04-17 NOTE — Telephone Encounter (Signed)
Spoke to pt to congratulate on completion of xrt. Relate doing well and without complaints. Encourage pt to call with needs. Received verbal understanding. Discuss survivorship program and referral.

## 2015-04-18 NOTE — Assessment & Plan Note (Signed)
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 1 out of 2 lymph nodes were positive for micrometastasis.Oncotype 12 (8% ROR) T1b N1 mic stage IB On XRT started 03/20/15  Recommendation: Adjuvant antiestrogen therapy after XRT is complete

## 2015-04-19 ENCOUNTER — Ambulatory Visit (HOSPITAL_BASED_OUTPATIENT_CLINIC_OR_DEPARTMENT_OTHER): Payer: Medicare Other | Admitting: Hematology and Oncology

## 2015-04-19 ENCOUNTER — Telehealth: Payer: Self-pay

## 2015-04-19 ENCOUNTER — Ambulatory Visit (HOSPITAL_BASED_OUTPATIENT_CLINIC_OR_DEPARTMENT_OTHER): Payer: Medicare Other

## 2015-04-19 ENCOUNTER — Telehealth: Payer: Self-pay | Admitting: Hematology and Oncology

## 2015-04-19 ENCOUNTER — Encounter: Payer: Self-pay | Admitting: Hematology and Oncology

## 2015-04-19 VITALS — BP 119/55 | HR 77 | Temp 98.6°F | Resp 18 | Ht 64.0 in | Wt 150.1 lb

## 2015-04-19 DIAGNOSIS — C50511 Malignant neoplasm of lower-outer quadrant of right female breast: Secondary | ICD-10-CM

## 2015-04-19 DIAGNOSIS — R5383 Other fatigue: Secondary | ICD-10-CM | POA: Diagnosis not present

## 2015-04-19 DIAGNOSIS — Z170 Estrogen receptor positive status [ER+]: Secondary | ICD-10-CM

## 2015-04-19 DIAGNOSIS — M899 Disorder of bone, unspecified: Secondary | ICD-10-CM

## 2015-04-19 LAB — CBC WITH DIFFERENTIAL/PLATELET
BASO%: 0.8 % (ref 0.0–2.0)
BASOS ABS: 0 10*3/uL (ref 0.0–0.1)
EOS%: 2.7 % (ref 0.0–7.0)
Eosinophils Absolute: 0.1 10*3/uL (ref 0.0–0.5)
HEMATOCRIT: 36.5 % (ref 34.8–46.6)
HGB: 11.6 g/dL (ref 11.6–15.9)
LYMPH%: 18.8 % (ref 14.0–49.7)
MCH: 26.1 pg (ref 25.1–34.0)
MCHC: 31.8 g/dL (ref 31.5–36.0)
MCV: 82.3 fL (ref 79.5–101.0)
MONO#: 0.4 10*3/uL (ref 0.1–0.9)
MONO%: 8.6 % (ref 0.0–14.0)
NEUT#: 3 10*3/uL (ref 1.5–6.5)
NEUT%: 69.1 % (ref 38.4–76.8)
Platelets: 208 10*3/uL (ref 145–400)
RBC: 4.44 10*6/uL (ref 3.70–5.45)
RDW: 14.6 % — AB (ref 11.2–14.5)
WBC: 4.4 10*3/uL (ref 3.9–10.3)
lymph#: 0.8 10*3/uL — ABNORMAL LOW (ref 0.9–3.3)

## 2015-04-19 LAB — COMPREHENSIVE METABOLIC PANEL (CC13)
ALT: 24 U/L (ref 0–55)
AST: 25 U/L (ref 5–34)
Albumin: 4.5 g/dL (ref 3.5–5.0)
Alkaline Phosphatase: 70 U/L (ref 40–150)
Anion Gap: 12 mEq/L — ABNORMAL HIGH (ref 3–11)
BUN: 14.6 mg/dL (ref 7.0–26.0)
CALCIUM: 9.9 mg/dL (ref 8.4–10.4)
CO2: 26 mEq/L (ref 22–29)
Chloride: 103 mEq/L (ref 98–109)
Creatinine: 0.8 mg/dL (ref 0.6–1.1)
EGFR: 88 mL/min/{1.73_m2} — ABNORMAL LOW (ref >90)
Glucose: 80 mg/dl (ref 70–140)
POTASSIUM: 3.6 meq/L (ref 3.5–5.1)
Sodium: 140 mEq/L (ref 136–145)
Total Bilirubin: 0.39 mg/dL (ref 0.20–1.20)
Total Protein: 7.7 g/dL (ref 6.4–8.3)

## 2015-04-19 LAB — IRON AND TIBC CHCC
%SAT: 35 % (ref 21–57)
IRON: 108 ug/dL (ref 41–142)
TIBC: 314 ug/dL (ref 236–444)
UIBC: 205 ug/dL (ref 120–384)

## 2015-04-19 LAB — FERRITIN CHCC: FERRITIN: 253 ng/mL (ref 9–269)

## 2015-04-19 LAB — VITAMIN B12: Vitamin B-12: 690 pg/mL (ref 211–911)

## 2015-04-19 LAB — FOLATE: Folate: 17.2 ng/mL

## 2015-04-19 MED ORDER — ANASTROZOLE 1 MG PO TABS: 1.0000 mg | ORAL_TABLET | Freq: Every day | ORAL | Status: DC

## 2015-04-19 NOTE — Progress Notes (Signed)
Patient Care Team: [name], MD as PCP - [place] III, MD as Consulting Physician (General Surgery) Truitt Merle, MD as Consulting Physician (Hematology)  DIAGNOSIS: Breast cancer of lower-outer quadrant of right female breast [facility])   Staging form: Breast, AJCC 7th Edition     Clinical: Stage IA (T1b, N0, M0) - Unsigned     Pathologic stage from 02/13/2015: Stage IB (T1b, N17mi, cM0) - Unsigned   SUMMARY OF ONCOLOGIC HISTORY: Oncology History   Breast cancer of lower-outer quadrant of right female breast   Staging form: Breast, AJCC 7th Edition     Clinical: Stage IA (T1b, N0, M0) - Unsigned     Pathologic stage from 02/13/2015: Stage IB (T1b, N35mi, cM0) - Unsigned        Breast cancer of lower-outer quadrant of right female breast ([facility])   01/05/2015 Imaging A 10 mm diameter enhancing mass in the right lower outer breast, otherwise negative.   01/18/2015 Mammogram Diagnostic mammo and ultrasound showed a 0.6 cm mass in the right breast 8:00 location 6 cm from the nipple, and a 0.5 cm lesion in the 10:00 location of the right breast 6 cm from the nipple. Right axilla was negative for adenopathy.   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 1 out of 2 lymph nodes were positive for micrometastasis.Oncotype 12 (8% ROR)   03/20/2015 - 04/14/2015 Radiation Therapy Adjuvant XRT    CHIEF COMPLIANT: Follow-up after radiation  INTERVAL HISTORY: Shannon Hancock is a 64 year old with above-mentioned history of right breast cancer currently here to discuss adjuvant antiestrogen therapy. She completed radiation and complaints of fatigue. She is healing very well from the effects of  radiation. She also has decreased appetite. She reports to me that she has fibromyalgia and causes some stiffness in her shoulders.  REVIEW OF SYSTEMS:   Constitutional: Denies fevers, chills or abnormal weight loss Eyes: Denies blurriness of vision Ears, nose, mouth, throat, and face: Denies mucositis or sore throat Respiratory: Denies cough, dyspnea or wheezes Cardiovascular: Denies palpitation, chest discomfort or lower extremity swelling Gastrointestinal:  Denies nausea, heartburn or change in bowel habits Skin: Denies abnormal skin rashes Lymphatics: Denies new lymphadenopathy or easy bruising Neurological:Denies numbness, tingling or new weaknesses Behavioral/Psych: Mood is stable, no new changes  Breast:  denies any pain or lumps or nodules in either breasts All other systems were reviewed with the patient and are negative.  I have reviewed the past medical history, past surgical history, social history and family history with the patient and they are unchanged from previous note.  ALLERGIES:  is allergic to olive oil and oxycodone.  MEDICATIONS:  Current Outpatient Prescriptions  Medication Sig Dispense Refill  . acetaminophen (TYLENOL) 325 MG tablet Take 650 mg by mouth every 6 (six) hours as needed.    . ALPRAZolam (XANAX) 1 MG tablet TAKE 1 TABLET BY MOUTH 4 TIMES A DAY AS NEEDED FOR SLEEP OR ANXIETY 120 tablet 0  . amLODipine (NORVASC) 5 MG tablet Take 5 mg by mouth daily.    Marland Kitchen anastrozole (ARIMIDEX) 1 MG tablet Take 1 tablet (1 mg total) by mouth daily. 90 tablet 3  . fexofenadine (ALLEGRA) 180 MG tablet Take 180 mg by mouth daily.    Marland Kitchen  hyaluronate sodium (RADIAPLEXRX) GEL Apply 1 application topically 2 (two) times daily.    . Lactobacillus (PROBIOTIC ACIDOPHILUS PO) Take by mouth.    . losartan-hydrochlorothiazide (HYZAAR) 100-12.5 MG per tablet TAKE 1 TABLET BY MOUTH DAILY. 30 tablet 0  . LYRICA 100 MG capsule TAKE ONE CAPSULE TWICE A DAY 60 capsule 3  . naproxen  sodium (ANAPROX) 220 MG tablet Take 220 mg by mouth as needed.    . non-metallic deodorant Jethro Poling) MISC Apply 1 application topically daily as needed.    Marland Kitchen omeprazole (PRILOSEC) 20 MG capsule Take 20 mg by mouth daily.    Marland Kitchen oxyCODONE-acetaminophen (PERCOCET/ROXICET) 5-325 MG per tablet     . promethazine (PHENERGAN) 12.5 MG tablet Take 1 tablet (12.5 mg total) by mouth every 6 (six) hours as needed for nausea or vomiting. (Patient not taking: Reported on 03/31/2015) 30 tablet 0  . tizanidine (ZANAFLEX) 2 MG capsule Take 2 mg by mouth at bedtime as needed for muscle spasms.    Marland Kitchen ZETIA 10 MG tablet TAKE 1 TABLET BY MOUTH DAILY. 90 tablet 1   No current facility-administered medications for this visit.    PHYSICAL EXAMINATION: ECOG PERFORMANCE STATUS: 1 - Symptomatic but completely ambulatory  Filed Vitals:   04/19/15 0834  BP: 119/55  Pulse: 77  Temp: 98.6 F (37 C)  Resp: 18   Filed Weights   04/19/15 0834  Weight: 150 lb 1.6 oz (68.085 kg)    GENERAL:alert, no distress and comfortable SKIN: skin color, texture, turgor are normal, no rashes or significant lesions EYES: normal, Conjunctiva are pink and non-injected, sclera clear OROPHARYNX:no exudate, no erythema and lips, buccal mucosa, and tongue normal  NECK: supple, thyroid normal size, non-tender, without nodularity LYMPH:  no palpable lymphadenopathy in the cervical, axillary or inguinal LUNGS: clear to auscultation and percussion with normal breathing effort HEART: regular rate & rhythm and no murmurs and no lower extremity edema ABDOMEN:abdomen soft, non-tender and normal bowel sounds Musculoskeletal:no cyanosis of digits and no clubbing  NEURO: alert & oriented x 3 with fluent speech, no focal motor/sensory deficits  LABORATORY DATA:  I have reviewed the data as listed   Chemistry      Component Value Date/Time   NA 138 03/10/2015 1115   NA 138 02/08/2015 1156   K 3.5 03/10/2015 1115   K 3.7 02/08/2015 1156   CL  101 02/08/2015 1156   CO2 28 03/10/2015 1115   CO2 30 02/08/2015 1156   BUN 14.5 03/10/2015 1115   BUN 13 02/08/2015 1156   CREATININE 0.8 03/10/2015 1115   CREATININE 0.91 02/08/2015 1156      Component Value Date/Time   CALCIUM 10.2 03/10/2015 1115   CALCIUM 10.0 02/08/2015 1156   ALKPHOS 75 03/10/2015 1115   ALKPHOS 51 03/16/2013 0939   AST 19 03/10/2015 1115   AST 20 03/16/2013 0939   ALT 14 03/10/2015 1115   ALT 16 03/16/2013 0939   BILITOT 0.60 03/10/2015 1115   BILITOT 0.4 03/16/2013 0939       Lab Results  Component Value Date   WBC 9.2 03/10/2015   HGB 12.5 03/10/2015   HCT 38.2 03/10/2015   MCV 80.9 03/10/2015   PLT 262 03/10/2015   NEUTROABS 6.2 03/10/2015   ASSESSMENT & PLAN:  Breast cancer of lower-outer quadrant of right female breast Right breast lumpectomy 02/13/2015: margins negative; Invasive ductal carcinoma, G1, 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 1 out of 2 lymph nodes were positive for micrometastasis.Oncotype 12 (8%  ROR) T1b N1 mic stage IB On XRT started 03/20/15  Recommendation: Adjuvant antiestrogen therapy after XRT is complete  Fatigue: We will get CBC CMP iron studies B: Folic acid today Return to clinic in 3 months to evaluate toxicity to anastrozole Osteopenia: I would like to obtain bone density report from Dr. Orest Dikes office. If her T score is -2 or worse, I would recommend bisphosphonate therapy. I encouraged her to take calcium and vitamin D.  Return to clinic in 3 months for follow-up with labs  Orders Placed This Encounter  Procedures  . CBC with Differential    Standing Status: Future     Number of Occurrences:      Standing Expiration Date: 04/18/2016  . Comprehensive metabolic panel (Cmet) - [facility]    Standing Status: Future     Number of Occurrences:      Standing Expiration Date: 04/18/2016  . Ferritin    Standing Status: Future     Number of Occurrences:      Standing Expiration Date: 04/18/2016  . Iron and TIBC  [facility]    Standing Status: Future     Number of Occurrences:      Standing Expiration Date: 04/18/2016  . Folate, Serum    Standing Status: Future     Number of Occurrences:      Standing Expiration Date: 04/18/2016  . Vitamin B12    Standing Status: Future     Number of Occurrences:      Standing Expiration Date: 04/18/2016   The patient has a good understanding of the overall plan. she agrees with it. she will call with any problems that may develop before the next visit here.   Rulon Eisenmenger, MD 04/19/2015

## 2015-04-19 NOTE — Telephone Encounter (Signed)
Let pt know all of her labs from today were ok.  Pt voiced understanding.

## 2015-04-19 NOTE — Addendum Note (Signed)
Addended by: Prentiss Bells on: 04/19/2015 09:10 AM   Modules accepted: Orders, Medications

## 2015-04-19 NOTE — Telephone Encounter (Signed)
Appointments made and avs printed for patient °

## 2015-04-20 ENCOUNTER — Encounter: Payer: Medicare Other | Admitting: Podiatry

## 2015-04-21 NOTE — Progress Notes (Signed)
04/21/15 - Dr. Caren Griffins White's office contacted.  They have no record of a bone density.

## 2015-04-25 ENCOUNTER — Ambulatory Visit (INDEPENDENT_AMBULATORY_CARE_PROVIDER_SITE_OTHER): Payer: Medicare Other | Admitting: Podiatry

## 2015-04-25 ENCOUNTER — Encounter: Payer: Self-pay | Admitting: Podiatry

## 2015-04-25 VITALS — BP 133/77 | HR 86 | Resp 18

## 2015-04-25 DIAGNOSIS — M795 Residual foreign body in soft tissue: Secondary | ICD-10-CM

## 2015-04-25 DIAGNOSIS — Z9889 Other specified postprocedural states: Secondary | ICD-10-CM

## 2015-04-25 NOTE — Progress Notes (Signed)
Patient ID: Shannon Hancock, female   DOB: [birth year], 64 y.o.   MRN: [id number]  DOS: 03/15/15 s/p R heel foreign body removal   Subjective: 64 year old female presents the office today  status post foreign body removal to the right heel. She says overall she is to call the pain is improving. She states the callus that she had overlying the heel is improving she's been applying moisturizer over the area daily. She denies any swelling or redness overlying the area. She is able to wear regular shoe for the most part. She missed her appointment last week due to headaches as well as other symptoms for which she saw her primary care physician for. No recent injury or trauma. No other complaints at this time. No acute changes. She denies any systemic complaints as fevers, chills, nausea, vomiting. ankle chest pain and joint swelling.  Objective: AAO 3, NAD Neurovascular status intact and unchanged Incision on the posterior aspect of the right heel is well coapted and a scab has formed. There is a small amount of hyperkeratotic tissue overlying the posterior aspect of the heel which appears to be resolving compared to last appointment. There is no tenderness to palpation of the surgical site and there is no overlying edema, erythema, increase in warmth. No other areas of tenderness to bilateral lower chemise. No other areas of edema, erythema, increase in warmth. There is no open lesions or pre-ulcerative lesions. No pain with calf compression, swelling, warmth, erythema.  Assessment: 64 year old female status post foreign body removal right heel; doing well.   Plan: -Treatment options discussed including all alternatives, risks, and complications -Hyperkeratotic tissue sharply debrided without consultation/pain. Continue moisturizer daily. Continue vigorous shoe gear as tolerated. Ice and elevation as needed. Pain medication if needed. -Monitor for any clinical signs or symptoms of infection and  directed to call the office immediately should any occur or go to the ER. -Follow-up in 4 weeks or sooner if any problems arise. In the meantime, encouraged to call the office with any questions, concerns, change in symptoms.   Celesta Gentile, DPM

## 2015-04-26 ENCOUNTER — Telehealth: Payer: Self-pay

## 2015-04-26 NOTE — Telephone Encounter (Signed)
Received Vascular study, Interpretation: No evidence of right low ext DVT or superficial thrombophlebitis

## 2015-05-05 ENCOUNTER — Encounter (HOSPITAL_COMMUNITY): Payer: Self-pay

## 2015-05-08 NOTE — Progress Notes (Signed)
  Radiation Oncology         [phone] ________________________________  Name: Shannon Hancock MRN: [id number]  Date: 04/14/2015  DOB: Sep 13, 1950  End of Treatment Note  Diagnosis:   right-sided breast cancer     Indication for treatment:  Curative       Radiation treatment dates:   03/20/15 - 04/14/15  Site/dose:   The patient initially received a dose of 42.5 Gy in 17 fractions to the breast using whole-breast tangent fields. This was delivered using a 3-D conformal technique. The patient then received a boost to the seroma. This delivered an additional 7.5 Gy in 3 fractions using an en face electron field due to the depth of the seroma. The total dose was 50 Gy.  Narrative: The patient tolerated radiation treatment relatively well.   The patient had some expected skin irritation as she progressed during treatment. Moist desquamation was not present at the end of treatment.  Plan: The patient has completed radiation treatment. The patient will return to radiation oncology clinic for routine followup in one month. I advised the patient to call or return sooner if they have any questions or concerns related to their recovery or treatment. ________________________________  Jodelle Gross, M.D., Ph.D.

## 2015-05-23 ENCOUNTER — Ambulatory Visit (INDEPENDENT_AMBULATORY_CARE_PROVIDER_SITE_OTHER): Payer: Medicare Other | Admitting: Podiatry

## 2015-05-23 ENCOUNTER — Encounter: Payer: Self-pay | Admitting: Podiatry

## 2015-05-23 VITALS — BP 124/74 | HR 76 | Resp 18

## 2015-05-23 DIAGNOSIS — M795 Residual foreign body in soft tissue: Secondary | ICD-10-CM

## 2015-05-23 DIAGNOSIS — Z9889 Other specified postprocedural states: Secondary | ICD-10-CM

## 2015-05-23 NOTE — Patient Instructions (Signed)
Bunionectomy A bunionectomy is a surgical procedure to remove a bunion. A bunion is a visible bump of bone on the inside of your foot where your big toe meets the rest of your foot. A bunion can develop when pressure turns this bone (first metatarsal) toward the other toes. Shoes that are too tight are the most common cause of bunions. Bunions can also be caused by diseases, such as arthritis and polio. You may need a bunionectomy if your bunion is very large and painful or it affects your ability to walk. LET YOUR HEALTH CARE PROVIDER KNOW ABOUT:  Any allergies you have.  All medicines you are taking, including vitamins, herbs, eye drops, creams, and over-the-counter medicines.  Previous problems you or members of your family have had with the use of anesthetics.  Any blood disorders you have.  Previous surgeries you have had.  Medical conditions you have. RISKS AND COMPLICATIONS  Generally, this is a safe procedure. However, problems may occur, including:  Infection.  Pain.  Nerve damage.  Bleeding or blood clots.  Reactions to medicines.  Numbness, stiffness, or arthritis in your toe.  Foot problems that continue even after the procedure. BEFORE THE PROCEDURE  Ask your health care provider about:  Changing or stopping your regular medicines. This is especially important if you are taking diabetes medicines or blood thinners.  Taking medicines such as aspirin and ibuprofen. These medicines can thin your blood. Do not take these medicines before your procedure if your health care provider instructs you not to.  Do not drink alcohol before the procedure as directed by your health care provider.  Do not use tobacco products, including cigarettes, chewing tobacco, or electronic cigarettes, before the procedure as directed by your health care provider. If you need help quitting, ask your health care provider.  Ask your health care provider what kind of medicine you will be  given during your procedure. A bunionectomy may be done using one of these:  A medicine that numbs the area (local anesthetic).  A medicine that makes you go to sleep (general anesthetic). If you will be given general anesthetic, do not eat or drink anything after midnight on the night before the procedure or as directed by your health care provider. PROCEDURE  An IV tube may be inserted into a vein.  You will be given local anesthetic or general anesthetic.  The surgeon will make a cut (incision) over the enlarged area at the first joint of the big toe. The surgeon will remove the bunion.  You may have more than one incision if any of the bones in your big toe need to be moved. A bone itself may need to be cut.  Sometimes the tissues around the big toe may also need to be cut then tightened or loosened to reposition the toe.  Screws or other hardware may be used to keep your foot in thecorrect position.  The incision will be closed with stitches (sutures) and covered with adhesive strips or another type of bandage (dressing). AFTER THE PROCEDURE  You may spend some time in a recovery area.  Your blood pressure, heart rate, breathing rate, and blood oxygen level will be monitored often until the medicines you were given have worn off.   This information is not intended to replace advice given to you by your health care provider. Make sure you discuss any questions you have with your health care provider.   Document Released: 05/31/2005 Document Revised: 03/08/2015 Document Reviewed: 02/02/2014   Chartered certified accountant Patient Education [employer].

## 2015-05-29 NOTE — Progress Notes (Signed)
Patient ID: Shannon Hancock, female   DOB: 08/20/1950, 64 y.o.   MRN: [id number]  DOS: 03/15/15 s/p R heel foreign body removal   Subjective: 64 year old female presents the office today  status post foreign body removal to the right heel. She states that she is improving and that she does not have the pain that she was having prior to surgery. She does get some small callus over the area the back of her heel but is greatly improved. She does apply moisturizer daily. She does continue to wear regular shoe. No surrounding redness, drainage, swelling. No other complaints at this time. No acute changes. She denies any systemic complaints as fevers, chills, nausea, vomiting. ankle chest pain and joint swelling.  Objective: AAO 3, NAD Neurovascular status intact and unchanged Incision on the posterior aspect of the right heel is well coapted and a scar has formed. There is a small amount of hyperkeratotic tissue overlying the posterior aspect of the heel which appears to be much less significant. he surgical site and there is no overlying edema, erythema, increase in warmth. No other areas of tenderness to bilateral lower  extremities. No other areas of edema, erythema, increase in warmth. There is no open lesions or pre-ulcerative lesions. No pain with calf compression, swelling, warmth, erythema.  Assessment: 64 year old female status post foreign body removal right heel; doing well.   Plan: -Treatment options discussed including all alternatives, risks, and complications -Hyperkeratotic tissue sharply debrided without consultation/pain. Continue moisturizer daily. Continue regular shoe gear as tolerated. Ice and elevation as needed. Pain medication if needed. -Monitor for any clinical signs or symptoms of infection and directed to call the office immediately should any occur or go to the ER. -Follow-up as needed. In the meantime, encouraged to call the office with any questions, concerns, change  in symptoms.   Celesta Gentile, DPM

## 2015-05-31 ENCOUNTER — Ambulatory Visit: Payer: Self-pay | Admitting: Radiation Oncology

## 2015-06-05 ENCOUNTER — Telehealth: Payer: Self-pay | Admitting: *Deleted

## 2015-06-05 NOTE — Telephone Encounter (Signed)
Patient had completed radiation treatment, asked for another alra and radiaplex, informed her to she can buy any deodorant and any lotion that has vitamin e , , her next appt dec 14/2016 1:45 pm with Dr. Lisbeth Renshaw, patient thanked this RN for calling back so soon 10:22 AM

## 2015-06-05 NOTE — Telephone Encounter (Signed)
Voicemail: "Dr. Lindi Adie, I have lost the deoderant and am out of the gel for my breast.  How do I get a refill on these?  These items were provided to me." Call forwarded to RT nurse and secretary as radiation supplied these items.

## 2015-06-06 NOTE — Telephone Encounter (Signed)
Call Documentation      Doreen Beam, RN at 06/05/2015 10:20 AM     Status: Signed       Expand All Collapse All   Patient had completed radiation treatment, asked for another alra and radiaplex, informed her to she can buy any deodorant and any lotion that has vitamin e , , her next appt dec 14/2016 1:45 pm with Dr. Lisbeth Renshaw, patient thanked this RN for calling back so soon 10:22 AM

## 2015-06-14 ENCOUNTER — Ambulatory Visit
Admission: RE | Admit: 2015-06-14 | Discharge: 2015-06-14 | Disposition: A | Discharge status: Home / Self Care | Payer: Medicare Other | Source: Ambulatory Visit | Attending: Radiation Oncology | Admitting: Radiation Oncology

## 2015-06-14 ENCOUNTER — Encounter: Payer: Self-pay | Admitting: Radiation Oncology

## 2015-06-14 VITALS — BP 126/57 | HR 75 | Temp 98.3°F | Resp 20 | Ht 64.0 in | Wt 154.6 lb

## 2015-06-14 DIAGNOSIS — C50511 Malignant neoplasm of lower-outer quadrant of right female breast: Secondary | ICD-10-CM

## 2015-06-14 NOTE — Progress Notes (Signed)
Radiation Oncology         (336) [phone] ________________________________  Name: NATHANIA GURA MRN: [id number]  Date: 06/14/2015  DOB: [birth year]  Follow-Up Visit Note  CC: Vidal Schwalbe, MD  [name], MD  Diagnosis:   Right-sided breast cancer  Interval Since Last Radiation:  Approximately one month   Narrative:  The patient returns today for routine follow-up.  She has done well overall since she finished treatment. The patient's skin has healed significantly since she completed her course of radiation treatment. She has begun anti-hormonal treatment.                              ALLERGIES:  is allergic to olive oil and oxycodone.  Meds: Current Outpatient Prescriptions  Medication Sig Dispense Refill  . acetaminophen (TYLENOL) 325 MG tablet Take 650 mg by mouth every 6 (six) hours as needed.    . ALPRAZolam (XANAX) 1 MG tablet TAKE 1 TABLET BY MOUTH 4 TIMES A DAY AS NEEDED FOR SLEEP OR ANXIETY 120 tablet 0  . amLODipine (NORVASC) 5 MG tablet Take 5 mg by mouth daily.    Marland Kitchen anastrozole (ARIMIDEX) 1 MG tablet Take 1 tablet (1 mg total) by mouth daily. 90 tablet 3  . fexofenadine (ALLEGRA) 180 MG tablet Take 180 mg by mouth daily.    . Lactobacillus (PROBIOTIC ACIDOPHILUS PO) Take by mouth.    . losartan-hydrochlorothiazide (HYZAAR) 100-12.5 MG per tablet TAKE 1 TABLET BY MOUTH DAILY. 30 tablet 0  . LYRICA 100 MG capsule TAKE ONE CAPSULE TWICE A DAY 60 capsule 3  . naproxen sodium (ANAPROX) 220 MG tablet Take 220 mg by mouth as needed.    Marland Kitchen omeprazole (PRILOSEC) 20 MG capsule Take 20 mg by mouth daily.    Marland Kitchen oxyCODONE-acetaminophen (PERCOCET/ROXICET) 5-325 MG per tablet     . promethazine (PHENERGAN) 12.5 MG tablet Take 1 tablet (12.5 mg total) by mouth every 6 (six) hours as needed for nausea or vomiting. 30 tablet 0  . tizanidine (ZANAFLEX) 2 MG capsule Take 2 mg by mouth at bedtime as needed for muscle spasms.    . traMADol (ULTRAM) 50 MG tablet     . ZETIA 10 MG  tablet TAKE 1 TABLET BY MOUTH DAILY. 90 tablet 1   No current facility-administered medications for this encounter.    Physical Findings: The patient is in no acute distress. Patient is alert and oriented.  height is 5\' 4"  (1.626 m) and weight is 154 lb 9.6 oz (70.126 kg). Her oral temperature is 98.3 F (36.8 C). Her blood pressure is 126/57 and her pulse is 75. Her respiration is 20. .   The skin in the treatment area has healed satisfactorily, no areas of concern/moist desquamation/poor healing  Lab Findings: Lab Results  Component Value Date   WBC 4.4 04/19/2015   HGB 11.6 04/19/2015   HCT 36.5 04/19/2015   MCV 82.3 04/19/2015   PLT 208 04/19/2015     Radiographic Findings: No results found.  Impression:    The patient has done satisfactorily since finishing treatment. She has begun anti-hormonal treatment. Some paperwork was given to the patient today for her travel insurance. She is to let [place] know if she needs any further information from [place].  Plan:  The patient will followup in our clinic on a when necessary basis.   Jodelle Gross, M.D., Ph.D.

## 2015-06-14 NOTE — Progress Notes (Signed)
foloow up s/p rad tx right breast 03/20/15-04/14/15, breast well healed, occasional pain in right breast, no soreness,  Survivorship scheduled with Chestine Spore 06/20/15, sees Dr. Lindi Adie 07/20/15.  Stated arimidex 1mg  oral daily 1:43 PM BP 126/57 mmHg  Pulse 75  Temp(Src) 98.3 F (36.8 C) (Oral)  Resp 20  Ht 5\' 4"  (1.626 m)  Wt 154 lb 9.6 oz (70.126 kg)  BMI 26.52 kg/m2  Wt Readings from Last 3 Encounters:  06/14/15 154 lb 9.6 oz (70.126 kg)  04/19/15 150 lb 1.6 oz (68.085 kg)  04/14/15 149 lb 4.8 oz (67.722 kg)

## 2015-06-20 ENCOUNTER — Encounter: Payer: Self-pay | Admitting: Nurse Practitioner

## 2015-06-21 ENCOUNTER — Telehealth: Payer: Self-pay | Admitting: Nurse Practitioner

## 2015-06-21 NOTE — Telephone Encounter (Signed)
Called and spoke with patient about missed visit for SCP on 06/20/15.  Rescheduled visit for 07/04/15 and will mail updated calendar.  Patient without questions at this time.

## 2015-06-22 ENCOUNTER — Encounter: Payer: Self-pay | Admitting: Radiation Oncology

## 2015-06-22 NOTE — Progress Notes (Signed)
Letter received from doctor reference  [employer] paperwork to be forwarded to them through mail, sent 12/22 Ardeen Fillers)

## 2015-06-27 ENCOUNTER — Telehealth: Payer: Self-pay | Admitting: *Deleted

## 2015-06-27 ENCOUNTER — Other Ambulatory Visit: Payer: Self-pay | Admitting: *Deleted

## 2015-06-27 NOTE — Telephone Encounter (Signed)
Patient called wanting to know if it was "normal" to still have soreness in her breast after surgery. Patient was seen in office in Oct and reports that nothing has changed since then. Advised that it is normal to still have some pain and soreness after surgery. Patient advised to monitor for any changes and she does have an appt with Dr. Marlou Starks on 1/9. She verbalized understanding.

## 2015-06-28 ENCOUNTER — Encounter (HOSPITAL_COMMUNITY): Payer: Self-pay

## 2015-06-30 ENCOUNTER — Encounter: Payer: Self-pay | Admitting: Nurse Practitioner

## 2015-07-04 ENCOUNTER — Encounter: Payer: Self-pay | Admitting: Nurse Practitioner

## 2015-07-05 ENCOUNTER — Encounter: Payer: Self-pay | Admitting: Nurse Practitioner

## 2015-07-05 DIAGNOSIS — C50511 Malignant neoplasm of lower-outer quadrant of right female breast: Secondary | ICD-10-CM

## 2015-07-05 NOTE — Progress Notes (Signed)
The Survivorship Care Plan was mailed to Shannon Hancock as she was unable to come in to the Survivorship Clinic for an in-person visit at this time after rescheduling previous no-show appointment. A letter was mailed to her outlining the purpose of the content of the care plan, as well as encouraging her to reach out to me with any questions or concerns.  My business card was included in the correspondence to the patient as well.  A copy of the care plan was also routed/faxed/mailed to Shannon Schwalbe, MD, the patient's PCP.  I will not be placing any follow-up appointments to the Survivorship Clinic for Shannon Hancock, but I am happy to see her at any time in the future for any survivorship concerns that may arise. Thank you for allowing me to participate in her care!  Kenn File, [place] [phone]

## 2015-07-19 ENCOUNTER — Other Ambulatory Visit: Payer: Self-pay | Admitting: *Deleted

## 2015-07-19 DIAGNOSIS — C50511 Malignant neoplasm of lower-outer quadrant of right female breast: Secondary | ICD-10-CM

## 2015-07-19 NOTE — Assessment & Plan Note (Signed)
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 1 out of 2 lymph nodes were positive for micrometastasis.Oncotype 12 (8% ROR) T1b N1 mic stage IB On XRT from 03/20/15 to 04/14/2015  Recommendation: Adjuvant antiestrogen therapy  Current treatment: Anastrozole 1 mg daily started November 2016 Anastrozole toxicities:  Fatigue:  Osteopenia: I would like to obtain bone density report from Dr. Orest Dikes office. If her T score is -2 or worse, I would recommend bisphosphonate therapy. I encouraged her to take calcium and vitamin D.  Return to clinic in 6 months for follow-up

## 2015-07-20 ENCOUNTER — Other Ambulatory Visit (HOSPITAL_BASED_OUTPATIENT_CLINIC_OR_DEPARTMENT_OTHER): Payer: Medicare Other

## 2015-07-20 ENCOUNTER — Ambulatory Visit (HOSPITAL_BASED_OUTPATIENT_CLINIC_OR_DEPARTMENT_OTHER): Payer: Medicare Other | Admitting: Hematology and Oncology

## 2015-07-20 ENCOUNTER — Telehealth: Payer: Self-pay | Admitting: Hematology and Oncology

## 2015-07-20 ENCOUNTER — Encounter: Payer: Self-pay | Admitting: Hematology and Oncology

## 2015-07-20 VITALS — BP 105/57 | HR 69 | Temp 97.8°F | Resp 18 | Ht 64.0 in | Wt 154.6 lb

## 2015-07-20 DIAGNOSIS — C50511 Malignant neoplasm of lower-outer quadrant of right female breast: Secondary | ICD-10-CM | POA: Diagnosis not present

## 2015-07-20 DIAGNOSIS — Z170 Estrogen receptor positive status [ER+]: Secondary | ICD-10-CM | POA: Diagnosis not present

## 2015-07-20 DIAGNOSIS — R5383 Other fatigue: Secondary | ICD-10-CM | POA: Diagnosis not present

## 2015-07-20 DIAGNOSIS — M899 Disorder of bone, unspecified: Secondary | ICD-10-CM | POA: Diagnosis not present

## 2015-07-20 DIAGNOSIS — Z79811 Long term (current) use of aromatase inhibitors: Secondary | ICD-10-CM

## 2015-07-20 LAB — COMPREHENSIVE METABOLIC PANEL
ALT: 19 U/L (ref 0–55)
AST: 24 U/L (ref 5–34)
Albumin: 4.3 g/dL (ref 3.5–5.0)
Alkaline Phosphatase: 68 U/L (ref 40–150)
Anion Gap: 10 mEq/L (ref 3–11)
BUN: 15 mg/dL (ref 7.0–26.0)
CHLORIDE: 102 meq/L (ref 98–109)
CO2: 28 mEq/L (ref 22–29)
Calcium: 9.8 mg/dL (ref 8.4–10.4)
Creatinine: 1 mg/dL (ref 0.6–1.1)
EGFR: 66 mL/min/{1.73_m2} — ABNORMAL LOW (ref >90)
GLUCOSE: 89 mg/dL (ref 70–140)
POTASSIUM: 3.6 meq/L (ref 3.5–5.1)
SODIUM: 139 meq/L (ref 136–145)
Total Bilirubin: 0.6 mg/dL (ref 0.20–1.20)
Total Protein: 7.7 g/dL (ref 6.4–8.3)

## 2015-07-20 LAB — CBC WITH DIFFERENTIAL/PLATELET
BASO%: 1.4 % (ref 0.0–2.0)
BASOS ABS: 0.1 10*3/uL (ref 0.0–0.1)
EOS%: 2 % (ref 0.0–7.0)
Eosinophils Absolute: 0.2 10*3/uL (ref 0.0–0.5)
HCT: 36.9 % (ref 34.8–46.6)
HEMOGLOBIN: 11.9 g/dL (ref 11.6–15.9)
LYMPH#: 1.3 10*3/uL (ref 0.9–3.3)
LYMPH%: 14.6 % (ref 14.0–49.7)
MCH: 26.1 pg (ref 25.1–34.0)
MCHC: 32.4 g/dL (ref 31.5–36.0)
MCV: 80.5 fL (ref 79.5–101.0)
MONO#: 0.6 10*3/uL (ref 0.1–0.9)
MONO%: 7.4 % (ref 0.0–14.0)
NEUT#: 6.5 10*3/uL (ref 1.5–6.5)
NEUT%: 74.6 % (ref 38.4–76.8)
Platelets: 220 10*3/uL (ref 145–400)
RBC: 4.58 10*6/uL (ref 3.70–5.45)
RDW: 14.1 % (ref 11.2–14.5)
WBC: 8.7 10*3/uL (ref 3.9–10.3)

## 2015-07-20 NOTE — Telephone Encounter (Signed)
Gave patient avs report and appointments for July.  °

## 2015-07-20 NOTE — Progress Notes (Signed)
Patient Care Team: [name], MD as PCP - [place] III, MD as Consulting Physician (General Surgery) Kyung Rudd, MD as Consulting Physician (Radiation Oncology) Sylvan Cheese, NP as Nurse Practitioner (Hematology and Oncology) Nicholas Lose, MD as Consulting Physician (Hematology and Oncology)  DIAGNOSIS: Breast cancer of lower-outer quadrant of right female breast [facility])   Staging form: Breast, AJCC 7th Edition     Clinical: Stage IA (T1b, N0, M0) - Unsigned     Pathologic stage from 02/13/2015: Stage IB (T1b, N46m, cM0) - Unsigned   SUMMARY OF ONCOLOGIC HISTORY: Oncology History   Breast cancer of lower-outer quadrant of right female breast   Staging form: Breast, AJCC 7th Edition     Clinical: Stage IA (T1b, N0, M0) - Unsigned     Pathologic stage from 02/13/2015: Stage IB (T1b, N125m cM0) - Unsigned        Breast cancer of lower-outer quadrant of right female breast ([facility]  01/05/2015 Mammogram Right breast: possible mass warranting further evaluation   01/05/2015 Breast MRI 10 mm diameter enhancing mass in the lower outer right breast suspicious for malignancy   01/18/2015 Mammogram Diagnostic mammo and ultrasound showed a 0.6 cm mass in the right breast 8:00 location 6 cm from the nipple, and a 0.5 cm lesion in the 10:00 location of the right breast 6 cm from the nipple. Right axilla was negative for adenopathy.   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 1 out of 2 lymph nodes were positive for micrometastasis.   02/13/2015 Oncotype testing RS 12 (8% ROR)   02/13/2015 Pathologic Stage Stage IB: T1b  N1(mi)   03/10/2015 Procedure Breast/Ovarian Panel (Gene Dx): no clinically significant variant at ATM, BARD1, BRCA1, BRCA2, BRIP1, CDH1, CHEK2, FANCC, MLH1, MSH2, MSH6, NBN, PALB2, PMS2, PTEN, RAD51C, RAD51D, TP53, and XRCC2.    03/20/2015 - 04/14/2015 [facility]iation [facility] over 17 fractions to the breast using whole-breast tangent fields. Right breast boost 7.5 Gy over 3 fractions. Total dose: 50 Gy   04/19/2015 -  Anti-estrogen oral therapy Anastrozole 1 mg daily. Planned duration of therapy 5-10 years.   07/05/2015 Survivorship Survivorship care plan completed and mailed to patient in lieu of in person visit.    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 She appears to be tolerating menstrual extremely well. She does not have any hot flashes or myalgias. She does complain of discomfort in the right breast with some nodular changes to the breast as well as the difficulty with raising her arm.  REVIEW OF SYSTEMS:   Constitutional: Denies fevers, chills or abnormal weight loss Eyes: Denies blurriness of vision Ears, nose, mouth, throat, and face: Denies mucositis or sore throat Respiratory: Denies cough, dyspnea or wheezes Cardiovascular: Denies palpitation, chest discomfort Gastrointestinal:  Denies nausea, heartburn or change in bowel habits Skin: Denies abnormal skin rashes Lymphatics: Denies new lymphadenopathy or easy bruising Neurological:Denies numbness, tingling or new weaknesses Behavioral/Psych: Mood is stable, no new changes  Extremities: No lower extremity edema Breast: Right breast discomfort All other systems were reviewed with the patient and are negative.  I have reviewed the past medical history, past surgical history,  social history and family history with the patient and they are unchanged from previous note.  ALLERGIES:  is allergic to olive oil and oxycodone.  MEDICATIONS:  Current Outpatient Prescriptions  Medication Sig Dispense Refill  . acetaminophen (TYLENOL) 325 MG tablet Take 650 mg by mouth every 6 (six) hours as needed.    . ALPRAZolam (XANAX) 1 MG tablet TAKE 1 TABLET BY MOUTH 4 TIMES A DAY AS NEEDED FOR SLEEP OR ANXIETY 120 tablet 0  . amLODipine (NORVASC) 5 MG tablet Take 5 mg by mouth daily.    Marland Kitchen anastrozole (ARIMIDEX) 1 MG tablet Take 1 tablet (1 mg total) by mouth daily. 90 tablet 3  . fexofenadine (ALLEGRA) 180 MG tablet Take 180 mg by mouth daily.    . Lactobacillus (PROBIOTIC ACIDOPHILUS PO) Take by mouth.    . losartan-hydrochlorothiazide (HYZAAR) 100-12.5 MG per tablet TAKE 1 TABLET BY MOUTH DAILY. 30 tablet 0  . LYRICA 100 MG capsule TAKE ONE CAPSULE TWICE A DAY 60 capsule 3  . naproxen sodium (ANAPROX) 220 MG tablet Take 220 mg by mouth as needed.    Marland Kitchen omeprazole (PRILOSEC) 20 MG capsule Take 20 mg by mouth daily.    Marland Kitchen oxyCODONE-acetaminophen (PERCOCET/ROXICET) 5-325 MG per tablet     . promethazine (PHENERGAN) 12.5 MG tablet Take 1 tablet (12.5 mg total) by mouth every 6 (six) hours as needed for nausea or vomiting. 30 tablet 0  . tizanidine (ZANAFLEX) 2 MG capsule Take 2 mg by mouth at bedtime as needed for muscle spasms.    . traMADol (ULTRAM) 50 MG tablet     . ZETIA 10 MG tablet TAKE 1 TABLET BY MOUTH DAILY. 90 tablet 1   No current facility-administered medications for this visit.    PHYSICAL EXAMINATION: ECOG PERFORMANCE STATUS: 1 - Symptomatic but completely ambulatory  Filed Vitals:   07/20/15 0935 07/20/15 0936  BP: 133/53 105/57  Pulse: 69   Temp: 97.8 F (36.6 C)   Resp: 18    Filed Weights   07/20/15 0935  Weight: 154 lb 9.6 oz (70.126 kg)    GENERAL:alert, no distress and comfortable SKIN: skin color, texture, turgor are normal, no rashes or  significant lesions EYES: normal, Conjunctiva are pink and non-injected, sclera clear OROPHARYNX:no exudate, no erythema and lips, buccal mucosa, and tongue normal  NECK: supple, thyroid normal size, non-tender, without nodularity LYMPH:  no palpable lymphadenopathy in the cervical, axillary or inguinal LUNGS: clear to auscultation and percussion with normal breathing effort HEART: regular rate & rhythm and no murmurs and no lower extremity edema ABDOMEN:abdomen soft, non-tender and normal bowel sounds MUSCULOSKELETAL:no cyanosis of digits and no clubbing  NEURO: alert & oriented x 3 with fluent speech, no focal motor/sensory deficits EXTREMITIES: No lower extremity edema BREAST: The scar tissue around the right breast scar is nodular and there is mild swelling of the right breast. She has some limitation of range of motion of the right upper extremity. (exam performed in the presence of a chaperone)  LABORATORY DATA:  I have reviewed the data as listed   Chemistry      Component Value Date/Time   NA 140 04/19/2015 0907   NA 138 02/08/2015 1156   K 3.6 04/19/2015 0907   K 3.7 02/08/2015 1156   CL 101 02/08/2015 1156  CO2 26 04/19/2015 0907   CO2 30 02/08/2015 1156   BUN 14.6 04/19/2015 0907   BUN 13 02/08/2015 1156   CREATININE 0.8 04/19/2015 0907   CREATININE 0.91 02/08/2015 1156      Component Value Date/Time   CALCIUM 9.9 04/19/2015 0907   CALCIUM 10.0 02/08/2015 1156   ALKPHOS 70 04/19/2015 0907   ALKPHOS 51 03/16/2013 0939   AST 25 04/19/2015 0907   AST 20 03/16/2013 0939   ALT 24 04/19/2015 0907   ALT 16 03/16/2013 0939   BILITOT 0.39 04/19/2015 0907   BILITOT 0.4 03/16/2013 0939       Lab Results  Component Value Date   WBC 8.7 07/20/2015   HGB 11.9 07/20/2015   HCT 36.9 07/20/2015   MCV 80.5 07/20/2015   PLT 220 07/20/2015   NEUTROABS 6.5 07/20/2015     ASSESSMENT & PLAN:  Breast cancer of lower-outer quadrant of right female breast Right breast  lumpectomy 02/13/2015: margins negative; Invasive ductal carcinoma, G1, 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 1 out of 2 lymph nodes were positive for micrometastasis.Oncotype 12 (8% ROR) T1b N1 mic stage IB On XRT from 03/20/15 to 04/14/2015  Recommendation: Adjuvant antiestrogen therapy  Current treatment: Anastrozole 1 mg daily started November 2016 Anastrozole toxicities: No major toxicities like hot flashes.  Fatigue: Could be related to fibromyalgia   Osteopenia: I would like to obtain bone density report from Dr. Orest Dikes office. If her T score is -2 or worse, I would recommend bisphosphonate therapy. I encouraged her to take calcium and vitamin D.  Return to clinic in 6 months for follow-up   No orders of the defined types were placed in this encounter.   The patient has a good understanding of the overall plan. she agrees with it. she will call with any problems that may develop before the next visit here.   Rulon Eisenmenger, MD 07/20/2015

## 2015-08-16 ENCOUNTER — Ambulatory Visit (INDEPENDENT_AMBULATORY_CARE_PROVIDER_SITE_OTHER): Payer: Medicare Other | Admitting: Podiatry

## 2015-08-16 ENCOUNTER — Ambulatory Visit (INDEPENDENT_AMBULATORY_CARE_PROVIDER_SITE_OTHER): Payer: Medicare Other

## 2015-08-16 ENCOUNTER — Encounter: Payer: Self-pay | Admitting: Podiatry

## 2015-08-16 DIAGNOSIS — M79673 Pain in unspecified foot: Secondary | ICD-10-CM | POA: Diagnosis not present

## 2015-08-16 DIAGNOSIS — M722 Plantar fascial fibromatosis: Secondary | ICD-10-CM

## 2015-08-16 DIAGNOSIS — R600 Localized edema: Secondary | ICD-10-CM

## 2015-08-16 MED ORDER — TRIAMCINOLONE ACETONIDE 10 MG/ML IJ SUSP
10.0000 mg | Freq: Once | INTRAMUSCULAR | Status: AC
  Administered 2015-08-16: 10 mg

## 2015-08-16 NOTE — Progress Notes (Signed)
Subjective:     Patient ID: Shannon Hancock, female   DOB: July 09, 1950, 65 y.o.   MRN: [id number]  HPI patient presents stating I have pain in both my heels and arches and I've had some swelling in my feet with a history of a respiratory infection   Review of Systems     Objective:   Physical Exam  neurovascular status unchanged with patient noted to have discomfort in the plantar arch bilateral with +1 pitting edema in the ankle and negative Homans sign noted. She has no pulmonary distress currently and is not giving any history of heart issues    Assessment:      plantar fasciitis bilateral with edema which may be due to respiratory condition or other pathology but that is being followed by specific doctor's    Plan:      H&P conditions reviewed and today I went ahead and injected the plantar fascia bilateral 3 mg Kenalog 5 mg Xylocaine and dispensed fascial brace for both feet. I then went ahead and discussed elevation and compression for edema and if there's any other systemic issues to go straight back to doctor who has been treating her   x-ray report bilateral indicated spur formation but no signs of stress fracture or arthritis

## 2015-08-22 ENCOUNTER — Ambulatory Visit: Payer: Medicare Other | Admitting: Podiatry

## 2015-09-06 ENCOUNTER — Encounter: Payer: Self-pay | Admitting: Podiatry

## 2015-09-06 ENCOUNTER — Ambulatory Visit (INDEPENDENT_AMBULATORY_CARE_PROVIDER_SITE_OTHER): Payer: Medicare Other | Admitting: Podiatry

## 2015-09-06 DIAGNOSIS — M722 Plantar fascial fibromatosis: Secondary | ICD-10-CM

## 2015-09-06 DIAGNOSIS — B351 Tinea unguium: Secondary | ICD-10-CM

## 2015-09-06 NOTE — Progress Notes (Signed)
Subjective:     Patient ID: Shannon Hancock, female   DOB: Dec 22, 1950, 65 y.o.   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

## 2015-10-24 ENCOUNTER — Other Ambulatory Visit: Payer: Self-pay

## 2015-10-24 DIAGNOSIS — C50511 Malignant neoplasm of lower-outer quadrant of right female breast: Secondary | ICD-10-CM

## 2015-10-30 ENCOUNTER — Telehealth: Payer: Self-pay | Admitting: *Deleted

## 2015-10-30 ENCOUNTER — Ambulatory Visit (HOSPITAL_BASED_OUTPATIENT_CLINIC_OR_DEPARTMENT_OTHER): Payer: Medicare Other | Admitting: Hematology and Oncology

## 2015-10-30 ENCOUNTER — Other Ambulatory Visit: Payer: Self-pay | Admitting: *Deleted

## 2015-10-30 ENCOUNTER — Encounter: Payer: Self-pay | Admitting: Hematology and Oncology

## 2015-10-30 DIAGNOSIS — R0789 Other chest pain: Secondary | ICD-10-CM | POA: Diagnosis not present

## 2015-10-30 DIAGNOSIS — C50511 Malignant neoplasm of lower-outer quadrant of right female breast: Secondary | ICD-10-CM

## 2015-10-30 DIAGNOSIS — M8580 Other specified disorders of bone density and structure, unspecified site: Secondary | ICD-10-CM | POA: Diagnosis not present

## 2015-10-30 DIAGNOSIS — Z79811 Long term (current) use of aromatase inhibitors: Secondary | ICD-10-CM | POA: Diagnosis not present

## 2015-10-30 NOTE — Progress Notes (Signed)
Patient Care Team: [name], MD as PCP - [place] III, MD as Consulting Physician (General Surgery) Kyung Rudd, MD as Consulting Physician (Radiation Oncology) Sylvan Cheese, NP as Nurse Practitioner (Hematology and Oncology) Nicholas Lose, MD as Consulting Physician (Hematology and Oncology)  DIAGNOSIS: Breast cancer of lower-outer quadrant of right female breast [facility])   Staging form: Breast, AJCC 7th Edition     Clinical: Stage IA (T1b, N0, M0) - Unsigned     Pathologic stage from 02/13/2015: Stage IB (T1b, N23m, cM0) - Unsigned   SUMMARY OF ONCOLOGIC HISTORY: Oncology History   Breast cancer of lower-outer quadrant of right female breast   Staging form: Breast, AJCC 7th Edition     Clinical: Stage IA (T1b, N0, M0) - Unsigned     Pathologic stage from 02/13/2015: Stage IB (T1b, N15m cM0) - Unsigned        [place] [place] [facility]  01/05/2015 Mammogram Right breast: possible mass warranting further evaluation   01/05/2015 Breast MRI 10 mm diameter enhancing mass in the lower outer right breast suspicious for malignancy   01/18/2015 Mammogram Diagnostic mammo and ultrasound showed a 0.6 cm mass in the right breast 8:00 location 6 cm from the nipple, and a 0.5 cm lesion in the 10:00 location of the right breast 6 cm from the nipple. Right axilla was negative for adenopathy.   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 1 out of 2 lymph nodes were positive for micrometastasis.   02/13/2015 Oncotype testing RS 12 (8% ROR)   02/13/2015 Pathologic Stage Stage IB: T1b  N1(mi)   03/10/2015 Procedure Breast/Ovarian Panel (Gene Dx): no clinically significant variant at ATM, BARD1, BRCA1, BRCA2, BRIP1, CDH1, CHEK2, FANCC, MLH1, MSH2, MSH6, NBN, PALB2, PMS2, PTEN, RAD51C, RAD51D, TP53, and XRCC2.    03/20/2015 - 04/14/2015 [facility]iation [facility].5 Gy over 17 fractions to the breast using whole-breast tangent fields. Right breast boost 7.5 Gy over 3 fractions. Total dose: 50 Gy   04/19/2015 -  Anti-estrogen oral therapy Anastrozole 1 mg daily. Planned duration of therapy 5-10 years.   07/05/2015 Survivorship Survivorship care plan completed and mailed to patient in lieu of in person visit.    CHIEF COMPLIANT: Right breast and chest wall tenderness  INTERVAL HISTORY: BoKEELI ROBERGs a 65104ear old with above-mentioned history of right breast cancer which was originally detected by breast MRI and not necessarily through a mammogram. It was picked up on ultrasound though. She came in today for an urgent visit complaining of right chest wall pain and tenderness along with the right arm pain. She also felt small palpable nodularity and in the right axilla at the site of the axillary lymph node dissection. She is going to start physical therapy next week.  REVIEW OF SYSTEMS:   Constitutional: Denies fevers, chills or abnormal weight loss Eyes: Denies blurriness of vision Ears, nose, mouth, throat, and face: Denies mucositis or sore throat Respiratory: Denies cough, dyspnea or wheezes Cardiovascular: Denies palpitation, chest discomfort Gastrointestinal:  Denies nausea, heartburn or change in bowel habits Skin: Denies abnormal skin rashes Lymphatics: Denies new lymphadenopathy or easy bruising  Neurological:Denies numbness, tingling or new weaknesses Behavioral/Psych: Mood is stable, no new changes  Extremities: No lower extremity edema Breast: Complains of tenderness underneath the right axilla. As well as right breast pain and radiating down the  right All other systems were reviewed with the patient and are negative.  I have reviewed the past medical history, past surgical history, social history and family history with the patient and they are unchanged from previous note.  ALLERGIES:  is allergic to olive oil and oxycodone.  MEDICATIONS:  Current Outpatient Prescriptions  Medication Sig Dispense Refill  . acetaminophen (TYLENOL) 325 MG tablet Take 650 mg by mouth every 6 (six) hours as needed.    . ALPRAZolam (XANAX) 1 MG tablet TAKE 1 TABLET BY MOUTH 4 TIMES A DAY AS NEEDED FOR SLEEP OR ANXIETY 120 tablet 0  . amLODipine (NORVASC) 5 MG tablet Take 5 mg by mouth daily.    Marland Kitchen anastrozole (ARIMIDEX) 1 MG tablet Take 1 tablet (1 mg total) by mouth daily. 90 tablet 3  . fexofenadine (ALLEGRA) 180 MG tablet Take 180 mg by mouth daily.    . Lactobacillus (PROBIOTIC ACIDOPHILUS PO) Take by mouth.    . losartan-hydrochlorothiazide (HYZAAR) 100-12.5 MG per tablet TAKE 1 TABLET BY MOUTH DAILY. 30 tablet 0  . LYRICA 100 MG capsule TAKE ONE CAPSULE TWICE A DAY 60 capsule 3  . meloxicam (MOBIC) 15 MG tablet Take 15 mg by mouth daily. Reported on 09/06/2015    . naproxen sodium (ANAPROX) 220 MG tablet Take 220 mg by mouth as needed.    Marland Kitchen omeprazole (PRILOSEC) 20 MG capsule Take 20 mg by mouth daily.    . promethazine (PHENERGAN) 12.5 MG tablet Take 1 tablet (12.5 mg total) by mouth every 6 (six) hours as needed for nausea or vomiting. 30 tablet 0  . tizanidine (ZANAFLEX) 2 MG capsule Take 2 mg by mouth at bedtime as needed for muscle spasms.    . traMADol (ULTRAM) 50 MG tablet     . ZETIA 10 MG tablet TAKE 1 TABLET BY MOUTH DAILY. 90 tablet 1   No current facility-administered medications for this visit.    PHYSICAL EXAMINATION: ECOG PERFORMANCE STATUS: 1 - Symptomatic but completely ambulatory  Filed Vitals:   10/30/15 1441  BP: 125/60  Pulse: 78  Temp: 97.6 F (36.4 C)  Resp: 18   Filed Weights   10/30/15 1441  Weight: 153 lb  (69.4 kg)    GENERAL:alert, no distress and comfortable SKIN: skin color, texture, turgor are normal, no rashes or significant lesions EYES: normal, Conjunctiva are pink and non-injected, sclera clear OROPHARYNX:no exudate, no erythema and lips, buccal mucosa, and tongue normal  NECK: supple, thyroid normal size, non-tender, without nodularity LYMPH:  no palpable lymphadenopathy in the cervical, axillary or inguinal LUNGS: clear to auscultation and percussion with normal breathing effort HEART: regular rate & rhythm and no murmurs and no lower extremity edema ABDOMEN:abdomen soft, non-tender and normal bowel sounds MUSCULOSKELETAL:no cyanosis of digits and no clubbing  NEURO: alert & oriented x 3 with fluent speech, no focal motor/sensory deficits EXTREMITIES: No lower extremity edema BREAST:No palpable masses or nodules in either right or left breasts. In the site of palpable nodularity is a surgical incision related to an axillary lymph node dissection. No palpable axillary supraclavicular or infraclavicular adenopathy no breast tenderness or nipple discharge. (exam performed in the presence of a chaperone)  LABORATORY DATA:  I have reviewed the data as listed   Chemistry  Component Value Date/Time   NA 139 07/20/2015 0916   NA 138 02/08/2015 1156   K 3.6 07/20/2015 0916   K 3.7 02/08/2015 1156   CL 101 02/08/2015 1156   CO2 28 07/20/2015 0916   CO2 30 02/08/2015 1156   BUN 15.0 07/20/2015 0916   BUN 13 02/08/2015 1156   CREATININE 1.0 07/20/2015 0916   CREATININE 0.91 02/08/2015 1156      Component Value Date/Time   CALCIUM 9.8 07/20/2015 0916   CALCIUM 10.0 02/08/2015 1156   ALKPHOS 68 07/20/2015 0916   ALKPHOS 51 03/16/2013 0939   AST 24 07/20/2015 0916   AST 20 03/16/2013 0939   ALT 19 07/20/2015 0916   ALT 16 03/16/2013 0939   BILITOT 0.60 07/20/2015 0916   BILITOT 0.4 03/16/2013 0939       Lab Results  Component Value Date   WBC 8.7 07/20/2015   HGB  11.9 07/20/2015   HCT 36.9 07/20/2015   MCV 80.5 07/20/2015   PLT 220 07/20/2015   NEUTROABS 6.5 07/20/2015     ASSESSMENT & PLAN:  Breast cancer of lower-outer quadrant of right female breast Right breast lumpectomy 02/13/2015: margins negative; Invasive ductal carcinoma, G1, 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 1 out of 2 lymph nodes were positive for micrometastasis.Oncotype 12 (8% ROR) T1b N1 mic stage IB On XRT from 03/20/15 to 04/14/2015  Recommendation: Adjuvant antiestrogen therapy  Current treatment: Anastrozole 1 mg daily started November 2016 Anastrozole toxicities: No major toxicities like hot flashes.  Right chest wall tenderness and right axillary lumpiness: I will obtain an ultrasound of the right breast and axilla.   Osteopenia: I encouraged her to take calcium and vitamin D.  Return to clinic as previously scheduled.    Orders Placed This Encounter  Procedures  . US BREAST LTD UNI RIGHT INC AXILLA    Standing Status: Future     Number of Occurrences:      Standing Expiration Date: 12/29/2016    Order Specific Question:  Reason for Exam (SYMPTOM  OR DIAGNOSIS REQUIRED)    Answer:  Right breast pain and tenderness in the axilla    Order Specific Question:  Preferred imaging location?    Answer:  [facility]   The patient has a good understanding of the overall plan. she agrees with it. she will call with any problems that may develop before the next visit here.   Rulon Eisenmenger, MD 10/30/2015

## 2015-10-30 NOTE — Telephone Encounter (Signed)
Patient called stating she is having increased pain in right breast and arm, takes Tylenol which helps some. Wants to be seen for some assurance. She is aware that pain can last for several years after surgery. pof sent to schedule patient for today.

## 2015-10-30 NOTE — Assessment & Plan Note (Signed)
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 1 out of 2 lymph nodes were positive for micrometastasis.Oncotype 12 (8% ROR) T1b N1 mic stage IB On XRT from 03/20/15 to 04/14/2015  Recommendation: Adjuvant antiestrogen therapy  Current treatment: Anastrozole 1 mg daily started November 2016 Anastrozole toxicities: No major toxicities like hot flashes.  Right chest wall tenderness and right axillary lumpiness: I will obtain an ultrasound of the right breast and axilla.   Osteopenia: I encouraged her to take calcium and vitamin D.  Return to clinic as previously scheduled.

## 2015-10-31 ENCOUNTER — Telehealth: Payer: Self-pay | Admitting: Hematology and Oncology

## 2015-10-31 ENCOUNTER — Other Ambulatory Visit: Payer: Self-pay | Admitting: *Deleted

## 2015-10-31 ENCOUNTER — Ambulatory Visit: Payer: Medicare Other | Admitting: Physical Therapy

## 2015-10-31 DIAGNOSIS — C50511 Malignant neoplasm of lower-outer quadrant of right female breast: Secondary | ICD-10-CM

## 2015-10-31 NOTE — Telephone Encounter (Signed)
Spoke with patient to confirm GI appt 5/5 at 1 pm per [facility] pof

## 2015-11-03 ENCOUNTER — Ambulatory Visit
Admission: RE | Admit: 2015-11-03 | Discharge: 2015-11-03 | Disposition: A | Discharge status: Home / Self Care | Payer: Medicare Other | Source: Ambulatory Visit | Attending: Hematology and Oncology | Admitting: Hematology and Oncology

## 2015-11-03 DIAGNOSIS — C50511 Malignant neoplasm of lower-outer quadrant of right female breast: Secondary | ICD-10-CM

## 2015-11-07 ENCOUNTER — Encounter: Payer: Self-pay | Admitting: Podiatry

## 2015-11-07 ENCOUNTER — Ambulatory Visit (INDEPENDENT_AMBULATORY_CARE_PROVIDER_SITE_OTHER): Payer: Medicare Other | Admitting: Podiatry

## 2015-11-07 DIAGNOSIS — B351 Tinea unguium: Secondary | ICD-10-CM

## 2015-11-07 DIAGNOSIS — M722 Plantar fascial fibromatosis: Secondary | ICD-10-CM | POA: Diagnosis not present

## 2015-11-07 NOTE — Patient Instructions (Signed)
Posterior Tibial Tendon Tendinitis With Rehab Tendonitis is a condition that is characterized by inflammation of a tendon or the lining (sheath) that surrounds it. The inflammation is usually caused by damage to the tendon, such as a tendon tear (strain). Sprains are classified into three categories. Grade 1 sprains cause pain, but the tendon is not lengthened. Grade 2 sprains include a lengthened ligament due to the ligament being stretched or partially ruptured. With grade 2 sprains there is still function, although the function may be diminished. Grade 3 sprains are characterized by a complete tear of the tendon or muscle, and function is usually impaired. Posterior tibialis tendonitis is tendonitis of the posterior tibial tendon, which attaches muscles of the lower leg to the foot. The posterior tibial tendon is located in the back of the ankle and helps the body straighten (plantar flex) and rotate inward (medially rotate) the ankle. SYMPTOMS   Pain, tenderness, swelling, warmth, and/or redness over the back of the inner ankle at the posterior tibial tendon or the inner part of the mid-foot.  Pain that worsens with plantar flexion or medial rotation of the ankle.  A crackling sound (crepitation) when the tendon is moved or touched. CAUSES  Posterior tibial tendonitis occurs when damage to the posterior tibial tendon starts an inflammatory response. Common mechanisms of injury include:  Degenerative (occurs with aging) processes that weaken the tendon and make it more susceptible to injury.  Stress placed on the tendon from an increase in the intensity, frequency, or duration of training.  Direct trauma to the ankle.  Returning to activity before a previous ankle injury is allowed to heal. RISK INCREASES WITH:  Activities that involve repetitive and/or stressful plantar flexion (jumping, kicking, or running up/down hills).  Poor strength and flexibility.  Flat feet.  Previous injury to  the foot, ankle, or leg. PREVENTION   Warm up and stretch properly before activity.  Allow for adequate recovery between workouts.  Maintain physical fitness:  Strength, flexibility, and endurance.  Cardiovascular fitness.  Learn and use proper technique. When possible, have a coach correct improper technique.  Complete rehabilitation from a previous foot, ankle, or leg injury.  If you have flat feet, wear arch supports (orthotics). PROGNOSIS  If treated properly, the symptoms of tendonitis usually resolve within 6 weeks. This period may be shorter for injuries caused by direct trauma. RELATED COMPLICATIONS   Prolonged healing time, if improperly treated or reinjured.  Recurrent symptoms that result in a chronic problem.  Partial or complete tendon tear (rupture) requiring surgery. TREATMENT  Treatment initially involves the use of ice and medication to help reduce pain and inflammation. The use of strengthening and stretching exercises may help reduce pain with activity. These exercises may be performed at home or with referral to a therapist. Often times, your caregiver will recommend immobilizing the ankle to allow the tendon to heal. If you have flat feet, you may be advised to wear orthotic arch supports. If symptoms persist for greater than 6 months despite nonsurgical (conservative) treatment, then surgery may be recommended. MEDICATION   If pain medication is necessary, then nonsteroidal anti-inflammatory medications, such as aspirin and ibuprofen, or other minor pain relievers, such as acetaminophen, are often recommended.  Do not take pain medication for 7 days before surgery.  Prescription pain relievers may be given if deemed necessary by your caregiver. Use only as directed and only as much as you need.  Corticosteroid injections may be given by your caregiver. These injections should   be reserved for the most serious cases because they may only be given a certain  number of times. HEAT AND COLD  Cold treatment (icing) relieves pain and reduces inflammation. Cold treatment should be applied for 10 to 15 minutes every 2 to 3 hours for inflammation and pain and immediately after any activity that aggravates your symptoms. Use ice packs or massage the area with a piece of ice (ice massage).  Heat treatment may be used prior to performing the stretching and strengthening activities prescribed by your caregiver, physical therapist, or athletic trainer. Use a heat pack or soak the injury in warm water. SEEK MEDICAL CARE IF:  Treatment seems to offer no benefit, or the condition worsens.  Any medications produce adverse side effects. EXERCISES RANGE OF MOTION (ROM) AND STRETCHING EXERCISES - Posterior Tibial Tendon Tendinitis These exercises may help you when beginning to rehabilitate your injury. Your symptoms may resolve with or without further involvement from your physician, physical therapist or athletic trainer. While completing these exercises, remember:   Restoring tissue flexibility helps normal motion to return to the joints. This allows healthier, less painful movement and activity.  An effective stretch should be held for at least 30 seconds.  A stretch should never be painful. You should only feel a gentle lengthening or release in the stretched tissue. RANGE OF MOTION - Ankle Plantar Flexion   Sit with your right / left leg crossed over your opposite knee.  Use your opposite hand to pull the top of your foot and toes toward you.  You should feel a gentle stretch on the top of your foot/ankle. Hold this position for __________ seconds. Repeat __________ times. Complete this exercise __________ times per day.  RANGE OF MOTION - Ankle Eversion   Sit with your right / left ankle crossed over your opposite knee.  Grip your foot with your opposite hand, placing your thumb on the top of your foot and your fingers across the bottom of your  foot.  Gently push your foot downward with a slight rotation so your littlest toes rise slightly.  You should feel a gentle stretch on the inside of your ankle. Hold the stretch for __________ seconds. Repeat __________ times. Complete this exercise __________ times per day.  RANGE OF MOTION - Ankle Inversion   Sit with your right / left ankle crossed over your opposite knee.  Grip your foot with your opposite hand, placing your thumb on the bottom of your foot and your fingers across the top of your foot.  Gently pull your foot so the smallest toe comes toward you and your thumb pushes the inside of the ball of your foot away from you.  You should feel a gentle stretch on the outside of your ankle. Hold the stretch for __________ seconds. Repeat __________ times. Complete this exercise __________ times per day.  RANGE OF MOTION - Dorsi/Plantar Flexion  While sitting with your right / left knee straight, draw the top of your foot upward by flexing your ankle. Then reverse the motion, pointing your toes downward.  Hold each position for __________ seconds.  After completing your first set of exercises, repeat this exercise with your knee bent. Repeat __________ times. Complete this exercise __________ times per day.  RANGE OF MOTION - Ankle Alphabet  Imagine your right / left big toe is a pen.  Keeping your hip and knee still, write out the entire alphabet with your "pen." Make the letters as large as you can without   increasing any discomfort. Repeat __________ times. Complete this exercise __________ times per day.  STRETCH - Gastrocsoleus   Sit with your right / left leg extended. Holding onto both ends of a belt or towel, loop it around the ball of your foot.  Keeping your right / left ankle and foot relaxed and your knee straight, pull your foot and ankle toward you using the belt/towel.  You should feel a gentle stretch behind your calf or knee. Hold this position for  __________ seconds. Repeat __________ times. Complete this exercise __________ times per day.  STRETCH - Gastroc, Standing   Place hands on wall.  Extend right / left leg, keeping the front knee somewhat bent.  Slightly point your toes inward on your back foot.  Keeping your right / left heel on the floor and your knee straight, shift your weight toward the wall, not allowing your back to arch.  You should feel a gentle stretch in the right / left calf. Hold this position for __________ seconds. Repeat __________ times. Complete this stretch __________ times per day. STRETCH - Soleus, Standing   Place hands on wall.  Extend right / left leg, keeping the other knee somewhat bent.  Slightly point your toes inward on your back foot.  Keep your right / left heel on the floor, bend your back knee, and slightly shift your weight over the back leg so that you feel a gentle stretch deep in your back calf.  Hold this position for __________ seconds. Repeat __________ times. Complete this stretch __________ times per day. STRENGTHENING EXERCISES - Posterior Tibial Tendon Tendinitis These exercises may help you when beginning to rehabilitate your injury. They may resolve your symptoms with or without further involvement from your physician, physical therapist, or athletic trainer. While completing these exercises, remember:   Muscles can gain both the endurance and the strength needed for everyday activities through controlled exercises.  Complete these exercises as instructed by your physician, physical therapist, or athletic trainer. Progress the resistance and repetitions only as guided. STRENGTH - Dorsiflexors  Secure a rubber exercise band/tubing to a fixed object (i.e., table, pole) and loop the other end around your right / left foot.  Sit on the floor facing the fixed object. The band/tubing should be slightly tense when your foot is relaxed.  Slowly draw your foot back toward you  using your ankle and toes.  Hold this position for __________ seconds. Slowly release the tension in the band and return your foot to the starting position. Repeat __________ times. Complete this exercise __________ times per day.  STRENGTH - Towel Curls  Sit in a chair positioned on a non-carpeted surface.  Place your foot on a towel, keeping your heel on the floor.  Pull the towel toward your heel by only curling your toes. Keep your heel on the floor.  If instructed by your physician, physical therapist, or athletic trainer, add ____________________ at the end of the towel. Repeat __________ times. Complete this exercise __________ times per day. STRENGTH - Ankle Eversion   Secure one end of a rubber exercise band/tubing to a fixed object (table, pole). Loop the other end around your foot just before your toes.  Place your fists between your knees. This will focus your strengthening at your ankle.  Drawing the band/tubing across your opposite foot, slowly pull your little toe out and up. Make sure the band/tubing is positioned to resist the entire motion.  Hold this position for __________ seconds.  Have   your muscles resist the band/tubing as it slowly pulls your foot back to the starting position. Repeat __________ times. Complete this exercise __________ times per day.  STRENGTH - Ankle Inversion   Secure one end of a rubber exercise band/tubing to a fixed object (table, pole). Loop the other end around your foot just before your toes.  Place your fists between your knees. This will focus your strengthening at your ankle.  Slowly, pull your big toe up and in, making sure the band/tubing is positioned to resist the entire motion.  Hold this position for __________ seconds.  Have your muscles resist the band/tubing as it slowly pulls your foot back to the starting position. Repeat __________ times. Complete this exercises __________ times per day.    This information is not  intended to replace advice given to you by your health care provider. Make sure you discuss any questions you have with your health care provider.   Document Released: 06/17/2005 Document Revised: 11/01/2014 Document Reviewed: 09/29/2008 [employer] Interactive Patient Education 2016 [employer] Plantar Fasciitis With Rehab The plantar fascia is a fibrous, ligament-like, soft-tissue structure that spans the bottom of the foot. Plantar fasciitis, also called heel spur syndrome, is a condition that causes pain in the foot due to inflammation of the tissue. SYMPTOMS   Pain and tenderness on the underneath side of the foot.  Pain that worsens with standing or walking. CAUSES  Plantar fasciitis is caused by irritation and injury to the plantar fascia on the underneath side of the foot. Common mechanisms of injury include:  Direct trauma to bottom of the foot.  Damage to a small nerve that runs under the foot where the main fascia attaches to the heel bone.  Stress placed on the plantar fascia due to bone spurs. RISK INCREASES WITH:   Activities that place stress on the plantar fascia (running, jumping, pivoting, or cutting).  Poor strength and flexibility.  Improperly fitted shoes.  Tight calf muscles.  Flat feet.  Failure to warm-up properly before activity.  Obesity. PREVENTION  Warm up and stretch properly before activity.  Allow for adequate recovery between workouts.  Maintain physical fitness:  Strength, flexibility, and endurance.  Cardiovascular fitness.  Maintain a health body weight.  Avoid stress on the plantar fascia.  Wear properly fitted shoes, including arch supports for individuals who have flat feet. PROGNOSIS  If treated properly, then the symptoms of plantar fasciitis usually resolve without surgery. However, occasionally surgery is necessary. RELATED COMPLICATIONS   Recurrent symptoms that may result in a chronic condition.  Problems of the  lower back that are caused by compensating for the injury, such as limping.  Pain or weakness of the foot during push-off following surgery.  Chronic inflammation, scarring, and partial or complete fascia tear, occurring more often from repeated injections. TREATMENT  Treatment initially involves the use of ice and medication to help reduce pain and inflammation. The use of strengthening and stretching exercises may help reduce pain with activity, especially stretches of the Achilles tendon. These exercises may be performed at home or with a therapist. Your caregiver may recommend that you use heel cups of arch supports to help reduce stress on the plantar fascia. Occasionally, corticosteroid injections are given to reduce inflammation. If symptoms persist for greater than 6 months despite non-surgical (conservative), then surgery may be recommended.  MEDICATION   If pain medication is necessary, then nonsteroidal anti-inflammatory medications, such as aspirin and ibuprofen, or other minor pain relievers, such as acetaminophen,  are often recommended.  Do not take pain medication within 7 days before surgery.  Prescription pain relievers may be given if deemed necessary by your caregiver. Use only as directed and only as much as you need.  Corticosteroid injections may be given by your caregiver. These injections should be reserved for the most serious cases, because they may only be given a certain number of times. HEAT AND COLD  Cold treatment (icing) relieves pain and reduces inflammation. Cold treatment should be applied for 10 to 15 minutes every 2 to 3 hours for inflammation and pain and immediately after any activity that aggravates your symptoms. Use ice packs or massage the area with a piece of ice (ice massage).  Heat treatment may be used prior to performing the stretching and strengthening activities prescribed by your caregiver, physical therapist, or athletic trainer. Use a heat pack  or soak the injury in warm water. SEEK IMMEDIATE MEDICAL CARE IF:  Treatment seems to offer no benefit, or the condition worsens.  Any medications produce adverse side effects. EXERCISES RANGE OF MOTION (ROM) AND STRETCHING EXERCISES - Plantar Fasciitis (Heel Spur Syndrome) These exercises may help you when beginning to rehabilitate your injury. Your symptoms may resolve with or without further involvement from your physician, physical therapist or athletic trainer. While completing these exercises, remember:   Restoring tissue flexibility helps normal motion to return to the joints. This allows healthier, less painful movement and activity.  An effective stretch should be held for at least 30 seconds.  A stretch should never be painful. You should only feel a gentle lengthening or release in the stretched tissue. RANGE OF MOTION - Toe Extension, Flexion  Sit with your right / left leg crossed over your opposite knee.  Grasp your toes and gently pull them back toward the top of your foot. You should feel a stretch on the bottom of your toes and/or foot.  Hold this stretch for __________ seconds.  Now, gently pull your toes toward the bottom of your foot. You should feel a stretch on the top of your toes and or foot.  Hold this stretch for __________ seconds. Repeat __________ times. Complete this stretch __________ times per day.  RANGE OF MOTION - Ankle Dorsiflexion, Active Assisted  Remove shoes and sit on a chair that is preferably not on a carpeted surface.  Place right / left foot under knee. Extend your opposite leg for support.  Keeping your heel down, slide your right / left foot back toward the chair until you feel a stretch at your ankle or calf. If you do not feel a stretch, slide your bottom forward to the edge of the chair, while still keeping your heel down.  Hold this stretch for __________ seconds. Repeat __________ times. Complete this stretch __________ times per  day.  STRETCH - Gastroc, Standing  Place hands on wall.  Extend right / left leg, keeping the front knee somewhat bent.  Slightly point your toes inward on your back foot.  Keeping your right / left heel on the floor and your knee straight, shift your weight toward the wall, not allowing your back to arch.  You should feel a gentle stretch in the right / left calf. Hold this position for __________ seconds. Repeat __________ times. Complete this stretch __________ times per day. STRETCH - Soleus, Standing  Place hands on wall.  Extend right / left leg, keeping the other knee somewhat bent.  Slightly point your toes inward on your back  foot.  Keep your right / left heel on the floor, bend your back knee, and slightly shift your weight over the back leg so that you feel a gentle stretch deep in your back calf.  Hold this position for __________ seconds. Repeat __________ times. Complete this stretch __________ times per day. STRETCH - Gastrocsoleus, Standing  Note: This exercise can place a lot of stress on your foot and ankle. Please complete this exercise only if specifically instructed by your caregiver.   Place the ball of your right / left foot on a step, keeping your other foot firmly on the same step.  Hold on to the wall or a rail for balance.  Slowly lift your other foot, allowing your body weight to press your heel down over the edge of the step.  You should feel a stretch in your right / left calf.  Hold this position for __________ seconds.  Repeat this exercise with a slight bend in your right / left knee. Repeat __________ times. Complete this stretch __________ times per day.  STRENGTHENING EXERCISES - Plantar Fasciitis (Heel Spur Syndrome)  These exercises may help you when beginning to rehabilitate your injury. They may resolve your symptoms with or without further involvement from your physician, physical therapist or athletic trainer. While completing these  exercises, remember:   Muscles can gain both the endurance and the strength needed for everyday activities through controlled exercises.  Complete these exercises as instructed by your physician, physical therapist or athletic trainer. Progress the resistance and repetitions only as guided. STRENGTH - Towel Curls  Sit in a chair positioned on a non-carpeted surface.  Place your foot on a towel, keeping your heel on the floor.  Pull the towel toward your heel by only curling your toes. Keep your heel on the floor.  If instructed by your physician, physical therapist or athletic trainer, add ____________________ at the end of the towel. Repeat __________ times. Complete this exercise __________ times per day. STRENGTH - Ankle Inversion  Secure one end of a rubber exercise band/tubing to a fixed object (table, pole). Loop the other end around your foot just before your toes.  Place your fists between your knees. This will focus your strengthening at your ankle.  Slowly, pull your big toe up and in, making sure the band/tubing is positioned to resist the entire motion.  Hold this position for __________ seconds.  Have your muscles resist the band/tubing as it slowly pulls your foot back to the starting position. Repeat __________ times. Complete this exercises __________ times per day.    This information is not intended to replace advice given to you by your health care provider. Make sure you discuss any questions you have with your health care provider.   Document Released: 06/17/2005 Document Revised: 11/01/2014 Document Reviewed: 09/29/2008 [employer] Interactive Patient Education [employer].

## 2015-11-07 NOTE — Progress Notes (Signed)
Patient ID: Shannon Hancock, female   DOB: [birth year], 65 y.o.   MRN: [id number]  Subjective: 65 year female presents the office today for follow-up with vibration and continued pain to both of her heels. She'll discuss further treatment for her plantar fasciitis aside from having just steroid injections performed. She states that she's had one steroid injection helped quite a bit for several weeks before the pain surgery occur. She is also purchase multiple over-the-counter inserts and changed shoes without much relief at this point she is inquiring about possible custom inserts. She has been continuing the plantar fascial brace as well as stretching icing. No numbness or tingling. No recent injury or trauma. The pain does not wake her up at night. Denies any systemic complaints such as fevers, chills, nausea, vomiting. No acute changes since last appointment, and no other complaints at this time.   Objective: AAO x3, NAD DP/PT pulses palpable bilaterally, CRT less than 3 seconds Protective sensation intact with Simms Weinstein monofilament Tenderness to palpation along the plantar medial tubercle of the calcaneus at the insertion of plantar fascia on the left and rightfoot. There is mild pain along the course of the plantar fascia within the arch of the foot. Plantar fascia appears to be intact. There is no pain with lateral compression of the calcaneus or pain with vibratory sensation. There is no pain along the course or insertion of the achilles tendon. No other areas of tenderness to bilateral lower extremities. No areas of pinpoint bony tenderness or pain with vibratory sensation. MMT 5/5, ROM WNL. No edema, erythema, increase in warmth to bilateral lower extremities.  Left hallux toenail disease be thick in crack however she is currently not having any pain. There is no redness or drainage or any swelling. She'll to hold off on nail avulsion of possible. It does cause some intermittent  discomfort. No open lesions or pre-ulcerative lesions.  No pain with calf compression, swelling, warmth, erythema  Assessment: 65 year old female continued plantar fasciitis, heel pain; onychodystrophy left hallux toenail  Plan: -All treatment options discussed with the patient including all alternatives, risks, complications.  -Previous x-rays are reviewed. -Etiology of symptoms were discussed -Patient elects to proceed with steroid injection into the left and right heel. Under sterile skin preparation, a total of 2.5cc of kenalog 10, 0.5% Marcaine plain, and 2% lidocaine plain were infiltrated into the symptomatic area without complication. A band-aid was applied. Patient tolerated the injection well without complication. Post-injection care with discussed with the patient. Discussed with the patient to ice the area over the next couple of days to help prevent a steroid flare.  -I had a very long discussion with the patient in regards to treatment options the short-term and long-term for plantar fasciitis. I discussed custom inserts. As discussed shoe gear modifications. Should proceed with custom inserts. She was scanned for inserts and there were sent to [facility] labs. -Continue stretching, icing, anti-inflammatories. Left hallux nail was debrided and sent for culture/biopsy -Follow-up 3 weeks -Patient encouraged to call the office with any questions, concerns, change in symptoms.   Celesta Gentile, DPM

## 2015-11-08 ENCOUNTER — Other Ambulatory Visit: Payer: Self-pay | Admitting: *Deleted

## 2015-11-09 ENCOUNTER — Ambulatory Visit: Payer: Medicare Other | Attending: Hematology and Oncology | Admitting: Physical Therapy

## 2015-11-09 DIAGNOSIS — M25611 Stiffness of right shoulder, not elsewhere classified: Secondary | ICD-10-CM | POA: Insufficient documentation

## 2015-11-09 DIAGNOSIS — M25511 Pain in right shoulder: Secondary | ICD-10-CM | POA: Insufficient documentation

## 2015-11-09 DIAGNOSIS — M6281 Muscle weakness (generalized): Secondary | ICD-10-CM | POA: Insufficient documentation

## 2015-11-10 ENCOUNTER — Other Ambulatory Visit: Payer: Self-pay | Admitting: Hematology and Oncology

## 2015-11-10 DIAGNOSIS — C50511 Malignant neoplasm of lower-outer quadrant of right female breast: Secondary | ICD-10-CM

## 2015-11-15 ENCOUNTER — Ambulatory Visit
Admission: RE | Admit: 2015-11-15 | Discharge: 2015-11-15 | Disposition: A | Discharge status: Home / Self Care | Payer: Medicare Other | Source: Ambulatory Visit | Attending: Hematology and Oncology | Admitting: Hematology and Oncology

## 2015-11-15 ENCOUNTER — Ambulatory Visit: Payer: Medicare Other | Admitting: Physical Therapy

## 2015-11-15 ENCOUNTER — Encounter: Payer: Self-pay | Admitting: Physical Therapy

## 2015-11-15 DIAGNOSIS — M6281 Muscle weakness (generalized): Secondary | ICD-10-CM

## 2015-11-15 DIAGNOSIS — M25511 Pain in right shoulder: Secondary | ICD-10-CM

## 2015-11-15 DIAGNOSIS — M25611 Stiffness of right shoulder, not elsewhere classified: Secondary | ICD-10-CM

## 2015-11-15 DIAGNOSIS — C50511 Malignant neoplasm of lower-outer quadrant of right female breast: Secondary | ICD-10-CM

## 2015-11-15 MED ORDER — GADOBENATE DIMEGLUMINE 529 MG/ML IV SOLN
14.0000 mL | Freq: Once | INTRAVENOUS | Status: AC | PRN
  Administered 2015-11-15: 14 mL via INTRAVENOUS

## 2015-11-15 NOTE — Therapy (Signed)
[place], [place], [address] Phone: [phone]   Fax:  [phone]  Physical Therapy Evaluation  Patient Details  Name: Shannon Hancock MRN: [id number] Date of Birth: [birth year] Referring Provider: Lindi Adie  Encounter Date: 11/15/2015      PT End of Session - 11/15/15 1216    Visit Number 1   Number of Visits 17   Date for PT Re-Evaluation 01/10/16   PT Start Time 0930   PT Stop Time 1013   PT Time Calculation (min) 43 min   Activity Tolerance Patient tolerated treatment well   Behavior During Therapy [facility] for tasks assessed/performed      Past Medical History  Diagnosis Date  . Allergy   . Hypertension   . Fibromyalgia   . Diverticulosis   . Heart murmur   . Anxiety   . 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 Tubal ligation    . Foot surgery    . Breast lumpectomy with radioactive seed and sentinel lymph node biopsy Right 02/13/2015    Procedure: RIGHT BREAST LUMPECTOMY WITH RADIOACTIVE SEED AND SENTINEL LYMPH NODE MAPPING;  Surgeon: [name], MD;  Location: [place];  Service: General;  Laterality: Right;    There were no vitals filed for this visit.       Subjective Assessment - 11/15/15 0938    Subjective Pt had a right lumpectomy and since then pt has had tightness and weakness in her R UE. She states she has been unable to carry things. She also has pain from her shoulder down her arm that she described as a radiating pain. She is not having that pain now.    Pertinent History Right breast lumpectomy 02/13/2015: margins negative; Invasive ductal carcinoma, G1, 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 1 out of 2 lymph nodes were positive for micrometastasis.Oncotype 12 (8% ROR) T1b N1 mic stage IB, pt completed radiation and is currently taking anastrozole    Patient Stated Goals strengthening her right arm to where she can carry her grandchild who is in a car seat   Currently in Pain? No/denies   Pain Score 0-No pain            [facility] PT Assessment - 11/15/15 0001    Assessment   Medical Diagnosis right breast cancer   Referring Provider G[place]   Onset Date/Surgical Date 02/13/15   Hand Dominance Right   Prior Therapy none   Precautions   Precautions Other (comment)  at risk for lymphedema   Restrictions   Weight Bearing Restrictions No   Balance Screen   Has the patient fallen in the past 6 months Yes   How many times? 1  when pt had a respiratory tract infection she missed 3 steps   Has the patient had a decrease in activity level because of a fear of falling?  No   Is the patient reluctant to leave their home because of a fear of falling?  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   Leisure pt does not exercise   Cognition   Overall Cognitive Status Within Functional Limits for tasks assessed   AROM   Right Shoulder Flexion 148 Degrees   Right Shoulder ABduction 134 Degrees   Right Shoulder Internal Rotation 66 Degrees   Right Shoulder External Rotation 90 Degrees   Left Shoulder Flexion 165 Degrees   Left Shoulder ABduction 180 Degrees   Left Shoulder Internal Rotation 63 Degrees   Left Shoulder External Rotation 90 Degrees   Strength   Overall Strength Comments L side UE grossly 5/5            LYMPHEDEMA/ONCOLOGY QUESTIONNAIRE - 11/15/15 0948    Type   Cancer Type right breast cancer   Surgeries   Lumpectomy Date 02/13/15   Sentinel Lymph Node Biopsy Date 02/13/15   Number Lymph Nodes Removed 2   Treatment   Active Chemotherapy Treatment No   Past Chemotherapy Treatment No   Active Radiation Treatment No   Past Radiation Treatment Yes   Date 04/15/15   Body Site R breast   Current Hormone Treatment Yes   Drug Name Anastrozole   Past Hormone Therapy No   What other symptoms do you have   Are you Having Heaviness or Tightness No   Are you having Pain --  somes pt has some tingling   Are you having pitting edema No   Is it Hard or Difficult finding clothes that fit No   Do you have infections No   Is there Decreased scar mobility Yes   Right Upper Extremity Lymphedema   15 cm Proximal to Olecranon Process 29.7 cm   Olecranon Process 25.9 cm   15 cm Proximal to Ulnar Styloid Process 24 cm   Just Proximal to Ulnar Styloid Process 15.6 cm   Across Hand at [employer] 20 cm   At [place] of 2nd Digit 6.1 cm   Left Upper Extremity Lymphedema   15 cm Proximal to Olecranon Process 29.5 cm   Olecranon Process 25.4 cm   15 cm Proximal to Ulnar Styloid Process 25.5 cm   Just Proximal to Ulnar Styloid Process 15.5 cm   Across Hand at [employer] 20 cm   At [place] of 2nd Digit 6.2 cm           Quick Dash - 11/15/15 0001    Open a tight or new jar Moderate difficulty   Do heavy household chores (wash walls, wash floors) Severe difficulty   Carry a shopping bag or briefcase Severe difficulty   Wash your back Moderate difficulty   Use a knife to cut food No difficulty   Recreational activities in which you take some force or impact through your arm, shoulder, or hand (golf, hammering, tennis) Severe difficulty   During the past week, to what extent has your arm, shoulder or hand problem interfered with your normal social activities with family, friends,  neighbors, or groups? Not at all   During the past week, to what extent has your arm, shoulder or hand problem limited your work or other regular daily activities Quite a bit   Arm, shoulder, or hand pain. Moderate   Tingling (pins and needles) in your arm, shoulder, or hand Moderate  Difficulty Sleeping No difficulty   DASH Score 45.45 %                        Short Term Clinic Goals - 12-10-2015 1220    CC Short Term Goal  #1   Title Pt will demonstrate increased right shoulder abduction range of motion of 155 degrees to allow pt to reach items out to sides   Baseline 134   Time 4   Period Weeks   Status New   CC Short Term Goal  #2   Title Pt will report a 25% improvement in pain in right UE for increased comfort   Time 4   Period Weeks   Status New             Long Term Clinic Goals - 12/10/2015 1221    CC Long Term Goal  #1   Title Pt will demonstrate 165 degrees of right shoulder flexion to allow pt to reach items overhead   Baseline 148   Time 8   Period Weeks   Status New   CC Long Term Goal  #2   Title Pt will demonstrate 175 degrees of right shoulder abduction to allow pt to reach items out to sides   Baseline 134   Time 8   Period Weeks   Status New   CC Long Term Goal  #3   Title Pt will be independent in a home exericse program for continued strengthening   Time 8   Period Weeks   Status New   CC Long Term Goal  #4   Title Pt will demonstrate gross R UE strength of 4/5 throughout to allow pt to return to PLOF   Time 8   Period Weeks   Status New   CC Long Term Goal  #5   Title Pt will score less than 15% impairment on Quick DASH for improved UE function   Baseline 45%   Time 8   Period Weeks   Status New            Plan - 12/10/15 1216    Clinical Impression Statement Pt underwent a right lumpectomy and radiation last fall. She has been having decreased strength and ROM in her right upper extremity. She has difficulty  carrying things and wants to be able to carry her grandchild in the carseat. Pt would benefit from skilled PT services for scar tissue mobilization, increase strength and ROM and RUE. At this time pt does not feel she is having any swelling in right breast or UE.    Rehab Potential Excellent   Clinical Impairments Affecting Rehab Potential hx of radiation   PT Frequency 2x / week   PT Duration 8 weeks   PT Treatment/Interventions Taping;Manual techniques;Therapeutic exercise;Scar mobilization;Passive range of motion;ADLs/Self Care Home Management   PT Next Visit Plan begin ROM exercises and scar mobilization on R   Consulted and Agree with Plan of Care Patient      Patient will benefit from skilled therapeutic intervention in order to improve the following deficits and impairments:  Decreased range of motion, Pain, Impaired UE functional use, Increased fascial restricitons, Decreased strength, Decreased scar mobility  Visit Diagnosis: Stiffness of right shoulder, not elsewhere classified - Plan: PT plan of care cert/re-cert  Pain in right shoulder - Plan: PT plan of care cert/re-cert  Muscle weakness (generalized) - Plan: PT plan of care cert/re-cert      G-Codes - December 10, 2015  1223    Functional Assessment Tool Used Quick DASH   Functional Limitation Carrying, moving and handling objects   Carrying, Moving and Handling Objects Current Status [phone]) At least 40 percent but less than 60 percent impaired, limited or restricted   Carrying, Moving and Handling Objects Goal Status ([id number]) At least 1 percent but less than 20 percent impaired, limited or restricted       Problem List Patient Active Problem List   Diagnosis Date Noted  . Family history of breast cancer in sister 03/10/2015  . Breast cancer of lower-outer quadrant of right female breast ([place]) 02/10/2015  . Abdominal bloating 12/16/2013  . Edema 11/09/2013  . Urge incontinence 05/05/2013  . Atrophic vaginitis 05/05/2013  .  Syncope 02/15/2013  . Irritable bowel syndrome 08/20/2012  . Hearing loss 11/07/2011  . Generalized anxiety disorder 11/07/2011  . Peripheral neuropathy ([place]) 07/05/2011  . Osteoporosis 07/05/2011  . Tinnitus, bilateral 04/05/2011  . HYPERCHOLESTEROLEMIA 04/05/2010  . ANEMIA, IRON DEFICIENCY 04/03/2010  . DEPRESSION, MAJOR, MODERATE 04/03/2010  . HYPERTENSION 04/03/2010  . ALLERGIC RHINITIS 04/03/2010  . 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 POSTMENOPAUSAL SYNDROME 04/03/2010  . 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 FIBROMYALGIA 04/03/2010    Alexia Freestone 11/15/2015, 12:26 PM  [place], [place], [address] Phone: [phone]   Fax:  [phone]  Name: Shannon Hancock MRN: [id number] Date of Birth: 03-26-1951   Allyson Sabal, PT 11/15/2015 12:26 PM

## 2015-11-22 ENCOUNTER — Ambulatory Visit: Payer: Medicare Other | Admitting: Physical Therapy

## 2015-11-23 ENCOUNTER — Ambulatory Visit: Payer: Medicare Other | Admitting: Physical Therapy

## 2015-11-24 ENCOUNTER — Telehealth: Payer: Self-pay | Admitting: *Deleted

## 2015-11-24 NOTE — Telephone Encounter (Signed)
Pt states she bought shoes that hurt her feet from [employer], what should she do, and will her feet get use to them.  I told pt that if they hurt not to wear them, it could make her situation worse.

## 2015-11-28 ENCOUNTER — Encounter: Payer: Self-pay | Admitting: Podiatry

## 2015-11-28 ENCOUNTER — Ambulatory Visit (INDEPENDENT_AMBULATORY_CARE_PROVIDER_SITE_OTHER): Payer: Medicare Other | Admitting: Podiatry

## 2015-11-28 VITALS — BP 125/69 | HR 77 | Resp 18

## 2015-11-28 DIAGNOSIS — B351 Tinea unguium: Secondary | ICD-10-CM

## 2015-11-28 DIAGNOSIS — M722 Plantar fascial fibromatosis: Secondary | ICD-10-CM

## 2015-11-28 DIAGNOSIS — L600 Ingrowing nail: Secondary | ICD-10-CM | POA: Diagnosis not present

## 2015-11-28 MED ORDER — TERBINAFINE HCL 250 MG PO TABS: 250.0000 mg | ORAL_TABLET | Freq: Every day | ORAL | Status: DC

## 2015-11-28 NOTE — Patient Instructions (Signed)

## 2015-11-28 NOTE — Progress Notes (Signed)
Patient ID: Shannon Hancock, female   DOB: [birth year], 65 y.o.   MRN: [id number]  Subjective: 65 year female presents the office today for follow-up evaluation of bilateral foot pain. She states her feet has improved and she has tried changing her shoes. She states that since doing that she has noticed some improvement in her symptoms. She is awaiting custom orthotics to be made. She does continue stretching, icing. She says her heels are better. She does present stay discussed nail culture results. He also states that she is getting ingrown toenails to both of her big toes. No redness or drainage or any swelling. Denies any systemic complaints such as fevers, chills, nausea, vomiting. No acute changes since last appointment, and no other complaints at this time.   Objective: AAO x3, NAD DP/PT pulses palpable bilaterally, CRT less than 3 seconds Protective sensation intact with Simms Weinstein monofilament There is greatly improved tenderness to palpation along the plantar medial tubercle of the calcaneus at the insertion of plantar fascia on the left and right foot. There is decreased pain along the course of the plantar fascia within the arch of the foot. Plantar fascia appears to be intact. There is no pain with lateral compression of the calcaneus or pain with vibratory sensation. There is no pain along the course or insertion of the achilles tendon. No other areas of tenderness to bilateral lower extremities. No areas of pinpoint bony tenderness or pain with vibratory sensation. MMT 5/5, ROM WNL. No edema, erythema, increase in warmth to bilateral lower extremities.  Bilateral hallux nails are to be dystrophic, discolored, hypertrophic. There is mild incurvation of both the medial and lateral nail borders without any erythema, drainage or pus or any edema. No open lesions or pre-ulcer lesions. No pain with calf compression, swelling, warmth, erythema  Assessment: 65 year old female continued  plantar fasciitis, heel pain; onychomycosis   Plan: -All treatment options discussed with the patient including all alternatives, risks, complications.  -Nail culture results were discussed the patient which didn't reveal onychomycosis. After long discussion the patient initially she wanted to proceed with Lamisil however she call the office after her appointment with proceed with topical treatment. This was ordered for her as a compound cream to include urea as well. Discussed with her this needs to be used for up to one year. -Bilateral hallux nails were debrided to remove the ingrown portion the toenail. Discussed if symptoms continue may need to have partial nail avulsion she understood this. -Continue stretching, icing as well as supportive shoe gear. She is wearing her orthotics. As her symptoms of improved we'll hold off on another steroid injection for now. -Follow-up to pick up her orthotics or sooner if any issues are to arise. Call if questions concerns meantime.  Celesta Gentile, DPM

## 2015-11-29 LAB — CBC WITH DIFFERENTIAL/PLATELET
BASOS ABS: 0 10*3/uL (ref 0.0–0.2)
Basos: 1 %
EOS (ABSOLUTE): 0.1 10*3/uL (ref 0.0–0.4)
Eos: 2 %
HEMOGLOBIN: 11.7 g/dL (ref 11.1–15.9)
Hematocrit: 36.1 % (ref 34.0–46.6)
IMMATURE GRANULOCYTES: 0 %
Immature Grans (Abs): 0 10*3/uL (ref 0.0–0.1)
LYMPHS ABS: 1.7 10*3/uL (ref 0.7–3.1)
Lymphs: 25 %
MCH: 26.1 pg — ABNORMAL LOW (ref 26.6–33.0)
MCHC: 32.4 g/dL (ref 31.5–35.7)
MCV: 81 fL (ref 79–97)
MONOCYTES: 7 %
Monocytes Absolute: 0.5 10*3/uL (ref 0.1–0.9)
NEUTROS PCT: 65 %
Neutrophils Absolute: 4.5 10*3/uL (ref 1.4–7.0)
Platelets: 297 10*3/uL (ref 150–379)
RBC: 4.48 x10E6/uL (ref 3.77–5.28)
RDW: 15.1 % (ref 12.3–15.4)
WBC: 6.9 10*3/uL (ref 3.4–10.8)

## 2015-11-29 LAB — HEPATIC FUNCTION PANEL
ALBUMIN: 5 g/dL — AB (ref 3.6–4.8)
ALT: 21 IU/L (ref 0–32)
AST: 31 IU/L (ref 0–40)
Alkaline Phosphatase: 73 IU/L (ref 39–117)
BILIRUBIN TOTAL: 0.5 mg/dL (ref 0.0–1.2)
Bilirubin, Direct: 0.13 mg/dL (ref 0.00–0.40)
TOTAL PROTEIN: 7.4 g/dL (ref 6.0–8.5)

## 2015-12-08 ENCOUNTER — Encounter: Payer: Self-pay | Admitting: Physical Therapy

## 2015-12-12 ENCOUNTER — Encounter: Payer: Self-pay | Admitting: Physical Therapy

## 2015-12-15 ENCOUNTER — Encounter: Payer: Self-pay | Admitting: Physical Therapy

## 2015-12-18 ENCOUNTER — Encounter: Payer: Self-pay | Admitting: Physical Therapy

## 2015-12-20 ENCOUNTER — Ambulatory Visit: Payer: Medicare Other | Attending: Hematology and Oncology | Admitting: Physical Therapy

## 2015-12-20 DIAGNOSIS — M25511 Pain in right shoulder: Secondary | ICD-10-CM | POA: Insufficient documentation

## 2015-12-20 DIAGNOSIS — M6281 Muscle weakness (generalized): Secondary | ICD-10-CM | POA: Diagnosis present

## 2015-12-20 DIAGNOSIS — M25611 Stiffness of right shoulder, not elsewhere classified: Secondary | ICD-10-CM | POA: Insufficient documentation

## 2015-12-20 NOTE — Patient Instructions (Addendum)
Cane Overhead - Supine  Hold cane at thighs with both hands, extend arms straight over head.  Repeat 5-10___ times. Do _2__ times per day.  External Rotation (Eccentric), Active-Assist - Supine (Cane)  Lie on back, affected arm out from side, elbow at 90, forearm forward. Use cane to assist in lifting forearm of affected arm to neutral. Slowly lower for 3-5 seconds. _5-10__ reps per set, 2 sets per day   Copyright  VHI. All rights reserved.    Www.youtube.com Lymphatic flow series with Joylene Grapes Crotzer

## 2015-12-20 NOTE — Therapy (Addendum)
[place], [place], [address] Phone: [phone]   Fax:  [phone]  Physical Therapy Treatment  Patient Details  Name: Shannon Hancock MRN: [id number] Date of Birth: [birth year] Referring Provider: Lindi Adie  Encounter Date: 12/20/2015      PT End of Session - 12/20/15 1517    Visit Number 2   Number of Visits 17   Date for PT Re-Evaluation 01/10/16   PT Start Time 0845   PT Stop Time 0930   PT Time Calculation (min) 45 min   Activity Tolerance Patient tolerated treatment well   Behavior During Therapy [facility] for tasks assessed/performed      Past Medical History  Diagnosis Date  . Allergy   . Hypertension   . Fibromyalgia   . Diverticulosis   . Heart murmur   . Anxiety   . 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 Tubal ligation    . Foot surgery    . Breast lumpectomy with radioactive seed and sentinel lymph node biopsy Right 02/13/2015    Procedure: RIGHT BREAST LUMPECTOMY WITH RADIOACTIVE SEED AND SENTINEL LYMPH NODE MAPPING;  Surgeon: [name], MD;  Location: [place];  Service: General;  Laterality: Right;    There were no vitals filed for this visit.      Subjective Assessment - 12/20/15 0850    Subjective pt had been out of town and so is just now beginning her treatment sessions.  she is still having weakness in her rtight arm adn sometimes has aching in arm    Pertinent History Right breast lumpectomy 02/13/2015: margins negative; Invasive ductal carcinoma, G1, 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 1 out of 2 lymph nodes were positive for micrometastasis.Oncotype 12 (8% ROR) T1b N1 mic stage IB, pt completed radiation and is currently taking anastrozole past histoy of fibromyalgia    Patient Stated Goals strengthening her right arm to where she can carry her  grandchild who is in a car seat   Currently in Pain? No/denies  has awarenes of her right arm             [facility] PT Assessment - 12/20/15 0001    AROM   Right Shoulder Flexion 160 Degrees   Right Shoulder ABduction 138 Degrees   Right Shoulder External Rotation 65 Degrees  measured in supine            LYMPHEDEMA/ONCOLOGY QUESTIONNAIRE - 12/20/15 0914    Right Upper Extremity Lymphedema   15 cm Proximal to Olecranon Process 30.5 cm   Olecranon Process 25.9 cm   15 cm Proximal to Ulnar Styloid Process 25 cm   Just Proximal to Ulnar Styloid Process 15.6 cm   Across Hand at [employer] 20 cm   At [place] of 2nd Digit 6 cm   Left Upper Extremity Lymphedema   15 cm Proximal to Olecranon Process 30 cm   Olecranon Process 25.5 cm   15 cm Proximal to Ulnar Styloid Process 25 cm   Just Proximal to Ulnar Styloid Process 15.5 cm   Across Hand at [employer] 20 cm   At [place] of 2nd Digit 6 cm                  [facility] Adult PT Treatment/Exercise - 12/20/15 0001    Self-Care   Self-Care Other Self-Care  Comments   Other Self-Care Comments  issued flyer for Live [employer] and ABC class    Neck Exercises: Seated   Other Seated Exercise brief introduction to lymphatic flow series    Shoulder Exercises: Supine   Protraction AROM;Right;10 reps   Protraction Limitations needs frequent cues for technique   Horizontal ABduction AROM;Both   Horizontal ABduction Limitations e reps with no theraband    External Rotation AROM;Both   External Rotation Limitations 3 reps   Flexion AROM;Both   Flexion Limitations 3 reps narrow and wide grip    Shoulder Exercises: Sidelying   ABduction AROM;Right   ABduction Limitations manual assistance for lateral glide of scapula   Other Sidelying Exercises small circles 5 times in each direction                 PT Education - 12/20/15 1241    Education provided Yes   Education Details shoulder exercise, flyer for live strong and ABC  class ,link for lymphatic yoga series    Person(s) Educated Patient   Methods Explanation;Demonstration;Handout   Comprehension Verbalized understanding;Returned demonstration           Short Term Clinic Goals - 12/20/15 1243    CC Short Term Goal  #1   Title Pt will demonstrate increased right shoulder abduction range of motion of 155 degrees to allow pt to reach items out to sides   Status On-going   CC Short Term Goal  #2   Title Pt will report a 25% improvement in pain in right UE for increased comfort   Status On-going             Organ Term Clinic Goals - 11/15/15 1221    CC Long Term Goal  #1   Title Pt will demonstrate 165 degrees of right shoulder flexion to allow pt to reach items overhead   Baseline 148   Time 8   Period Weeks   Status New   CC Long Term Goal  #2   Title Pt will demonstrate 175 degrees of right shoulder abduction to allow pt to reach items out to sides   Baseline 134   Time 8   Period Weeks   Status New   CC Long Term Goal  #3   Title Pt will be independent in a home exericse program for continued strengthening   Time 8   Period Weeks   Status New   CC Long Term Goal  #4   Title Pt will demonstrate gross R UE strength of 4/5 throughout to allow pt to return to PLOF   Time 8   Period Weeks   Status New   CC Long Term Goal  #5   Title Pt will score less than 15% impairment on Quick DASH for improved UE function   Baseline 45%   Time 8   Period Weeks   Status New            Plan - 12/20/15 1242    Clinical Impression Statement Pt has improved her range of motion some on her own, but continues to have decreased strength in her arm, especailly around her scapula.  She is ready to proceed with strengthening exercise and did well with active motion today.    Rehab Potential Excellent   Clinical Impairments Affecting Rehab Potential hx of radiation   PT Frequency 2x / week   PT Treatment/Interventions Taping;Manual  techniques;Therapeutic exercise;Scar mobilization;Passive range of motion;ADLs/Self Care Home Management   PT Next  Visit Plan review lymphatic flow series, teach supine scapular series with yellow theraband , check scars ,   Consulted and Agree with Plan of Care Patient      Patient will benefit from skilled therapeutic intervention in order to improve the following deficits and impairments:  Decreased range of motion, Pain, Impaired UE functional use, Increased fascial restricitons, Decreased strength, Decreased scar mobility  Visit Diagnosis: Stiffness of right shoulder, not elsewhere classified  Pain in right shoulder  Muscle weakness (generalized)     Problem List Patient Active Problem List   Diagnosis Date Noted  . Family history of breast cancer in sister 03/10/2015  . Breast cancer of lower-outer quadrant of right female breast ([place]) 02/10/2015  . Abdominal bloating 12/16/2013  . Edema 11/09/2013  . Urge incontinence 05/05/2013  . Atrophic vaginitis 05/05/2013  . Syncope 02/15/2013  . Irritable bowel syndrome 08/20/2012  . Hearing loss 11/07/2011  . Generalized anxiety disorder 11/07/2011  . Peripheral neuropathy ([place]) 07/05/2011  . Osteoporosis 07/05/2011  . Tinnitus, bilateral 04/05/2011  . HYPERCHOLESTEROLEMIA 04/05/2010  . ANEMIA, IRON DEFICIENCY 04/03/2010  . DEPRESSION, MAJOR, MODERATE 04/03/2010  . HYPERTENSION 04/03/2010  . ALLERGIC RHINITIS 04/03/2010  . 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 POSTMENOPAUSAL SYNDROME 04/03/2010  . 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 FIBROMYALGIA 04/03/2010   Donato Heinz. Owens Shark, PT  Norwood Levo 12/20/2015, 12:45 PM  [place], [place], [address] Phone: [phone]   Fax:  [phone]  Name: Shannon Hancock MRN: [id number] Date of Birth: 1950-12-02  PHYSICAL THERAPY DISCHARGE  SUMMARY  Visits from Start of Care: 2  Current functional level related to goals / functional outcomes: unknown  Remaining deficits: unknown   Education / Equipment: As above  Plan: Patient agrees to discharge.  Patient goals were partially met. Patient is being discharged due to not returning since the last visit.  ?????     Maudry Diego, PT 02/04/18 11:32 AM

## 2015-12-21 ENCOUNTER — Encounter: Payer: Self-pay | Admitting: Physical Therapy

## 2015-12-22 ENCOUNTER — Ambulatory Visit: Payer: Medicare Other | Admitting: Physical Therapy

## 2016-01-03 ENCOUNTER — Ambulatory Visit: Payer: Medicare Other | Attending: Hematology and Oncology | Admitting: Physical Therapy

## 2016-01-11 ENCOUNTER — Encounter (INDEPENDENT_AMBULATORY_CARE_PROVIDER_SITE_OTHER): Payer: Medicare Other | Admitting: Podiatry

## 2016-01-11 ENCOUNTER — Encounter: Payer: Self-pay | Admitting: Podiatry

## 2016-01-11 NOTE — Progress Notes (Signed)
This encounter was created in error - please disregard.

## 2016-01-18 ENCOUNTER — Telehealth: Payer: Self-pay | Admitting: Hematology and Oncology

## 2016-01-18 ENCOUNTER — Encounter: Payer: Self-pay | Admitting: Hematology and Oncology

## 2016-01-18 ENCOUNTER — Ambulatory Visit (HOSPITAL_BASED_OUTPATIENT_CLINIC_OR_DEPARTMENT_OTHER): Payer: Medicare Other | Admitting: Hematology and Oncology

## 2016-01-18 VITALS — BP 132/73 | HR 80 | Temp 98.5°F | Resp 18 | Wt 160.7 lb

## 2016-01-18 DIAGNOSIS — M8580 Other specified disorders of bone density and structure, unspecified site: Secondary | ICD-10-CM

## 2016-01-18 DIAGNOSIS — C50511 Malignant neoplasm of lower-outer quadrant of right female breast: Secondary | ICD-10-CM

## 2016-01-18 NOTE — Assessment & Plan Note (Signed)
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 1 out of 2 lymph nodes were positive for micrometastasis.Oncotype 12 (8% ROR) T1b N1 mic stage IB XRT from 03/20/15 to 04/14/2015  Current treatment: Anastrozole 1 mg daily started November 2016 Anastrozole toxicities: No major toxicities like hot flashes.  Right chest wall tenderness and right axillary lumpiness: Mammogram ultrasound and breast MRI did not reveal any evidence of recurrence.  Osteopenia: I encouraged her to take calcium and vitamin D.  Breast MRI 11/15/2015: No evidence of breast malignancy. Anterior and lateral skin thickening consistent with radiation change  Return to clinic in 6 months for follow-up

## 2016-01-18 NOTE — Progress Notes (Signed)
Patient Care Team: [name], MD as PCP - Burnside III, MD as Consulting Physician (General Surgery) Kyung Rudd, MD as Consulting Physician (Radiation Oncology) Sylvan Cheese, NP as Nurse Practitioner (Hematology and Oncology) Nicholas Lose, MD as Consulting Physician (Hematology and Oncology)  DIAGNOSIS: Breast cancer of lower-outer quadrant of right female breast [facility])   Staging form: Breast, AJCC 7th Edition     Clinical: Stage IA (T1b, N0, M0) - Unsigned     Pathologic stage from 02/13/2015: Stage IB (T1b, N25m, cM0) - Unsigned   SUMMARY OF ONCOLOGIC HISTORY: Oncology History   Breast cancer of lower-outer quadrant of right female breast   Staging form: Breast, AJCC 7th Edition     Clinical: Stage IA (T1b, N0, M0) - Unsigned     Pathologic stage from 02/13/2015: Stage IB (T1b, N143m cM0) - Unsigned        [place]  01/05/2015 Mammogram Right breast: possible mass warranting further evaluation   01/05/2015 Breast MRI 10 mm diameter enhancing mass in the lower outer right breast suspicious for malignancy   01/18/2015 Mammogram Diagnostic mammo and ultrasound showed a 0.6 cm mass in the right breast 8:00 location 6 cm from the nipple, and a 0.5 cm lesion in the 10:00 location of the right breast 6 cm from the nipple. Right axilla was negative for adenopathy.   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 1 out of 2 lymph nodes were positive for micrometastasis.   02/13/2015 Oncotype testing RS 12 (8% ROR)   02/13/2015 Pathologic Stage Stage IB: T1b  N1(mi)   03/10/2015 Procedure Breast/Ovarian Panel (Gene Dx): no clinically significant variant at ATM, BARD1, BRCA1, BRCA2, BRIP1, CDH1, CHEK2, FANCC, MLH1, MSH2, MSH6, NBN, PALB2, PMS2, PTEN, RAD51C, RAD51D, TP53, and XRCC2.    03/20/2015 - 04/14/2015 [facility]iation Therapy [facility]5 Gy over 17 fractions to the breast using whole-breast tangent fields. Right breast boost 7.5 Gy over 3 fractions. Total dose: 50 Gy   04/19/2015 -  Anti-estrogen oral therapy Anastrozole 1 mg daily. Planned duration of therapy 5-10 years.   07/05/2015 Survivorship Survivorship care plan completed and mailed to patient in lieu of in person visit.   11/15/2015 Breast MRI No evidence of breast malignancy. Anterior and lateral skin thickening consistent with radiation change    CHIEF COMPLIANT: Follow-up of breast cancer on anastrozole  INTERVAL HISTORY: Shannon GOATLEYs a [age]ear old with above-mentioned history of right breast cancer currently on antiestrogen therapy with anastrozole. In May 2017 she had problems with the right breasts and underwent a mammogram ultrasound and also breast MRI. There was no evidence of breast cancer recurrence of her post radiation changes. She has not had bilateral diagnostic mammograms but since she had breast MRIs, we decided to not conduct the additional mammogram on the left breast for this ER. She denies any major problems with anastrozole other than occasional hot flashes.  REVIEW OF SYSTEMS:   Constitutional: Denies fevers, chills or abnormal weight loss Eyes: Denies blurriness of vision Ears, nose, mouth, throat, and face: Denies mucositis or sore throat Respiratory: Denies cough, dyspnea or wheezes Cardiovascular:  Denies palpitation, chest discomfort Gastrointestinal:  Denies nausea, heartburn or change in bowel habits Skin: Denies abnormal skin rashes Lymphatics: Denies new lymphadenopathy or easy bruising Neurological:Denies numbness, tingling or new  weaknesses Behavioral/Psych: Mood is stable, no new changes  Extremities: No lower extremity edema Breast: Skin changes related to prior radiation All other systems were reviewed with the patient and are negative.  I have reviewed the past medical history, past surgical history, social history and family history with the patient and they are unchanged from previous note.  ALLERGIES:  is allergic to olive oil and oxycodone.  MEDICATIONS:  Current Outpatient Prescriptions  Medication Sig Dispense Refill  . acetaminophen (TYLENOL) 325 MG tablet Take 650 mg by mouth every 6 (six) hours as needed.    . ALPRAZolam (XANAX) 1 MG tablet TAKE 1 TABLET BY MOUTH 4 TIMES A DAY AS NEEDED FOR SLEEP OR ANXIETY 120 tablet 0  . amLODipine (NORVASC) 5 MG tablet Take 5 mg by mouth daily.    Marland Kitchen anastrozole (ARIMIDEX) 1 MG tablet Take 1 tablet (1 mg total) by mouth daily. 90 tablet 3  . fexofenadine (ALLEGRA) 180 MG tablet Take 180 mg by mouth daily.    . Lactobacillus (PROBIOTIC ACIDOPHILUS PO) Take by mouth.    . losartan-hydrochlorothiazide (HYZAAR) 100-12.5 MG per tablet TAKE 1 TABLET BY MOUTH DAILY. 30 tablet 0  . LYRICA 100 MG capsule TAKE ONE CAPSULE TWICE A DAY 60 capsule 3  . meloxicam (MOBIC) 15 MG tablet Take 15 mg by mouth daily. Reported on 11/15/2015    . naproxen sodium (ANAPROX) 220 MG tablet Take 220 mg by mouth as needed.    Marland Kitchen omeprazole (PRILOSEC) 20 MG capsule Take 20 mg by mouth daily.    . promethazine (PHENERGAN) 12.5 MG tablet Take 1 tablet (12.5 mg total) by mouth every 6 (six) hours as needed for nausea or vomiting. 30 tablet 0  . tizanidine (ZANAFLEX) 2 MG capsule Take 2 mg by mouth at bedtime as needed for muscle spasms.    . traMADol (ULTRAM) 50 MG tablet Reported on 11/15/2015    . ZETIA 10 MG tablet TAKE 1 TABLET BY MOUTH DAILY. 90 tablet 1   No current facility-administered medications for this visit.    PHYSICAL EXAMINATION: ECOG PERFORMANCE STATUS: 1 - Symptomatic but  completely ambulatory  Filed Vitals:   01/18/16 0854  BP: 132/73  Pulse: 80  Temp: 98.5 F (36.9 C)  Resp: 18   Filed Weights   01/18/16 0854  Weight: 160 lb 11.2 oz (72.893 kg)    GENERAL:alert, no distress and comfortable SKIN: skin color, texture, turgor are normal, no rashes or significant lesions EYES: normal, Conjunctiva are pink and non-injected, sclera clear OROPHARYNX:no exudate, no erythema and lips, buccal mucosa, and tongue normal  NECK: supple, thyroid normal size, non-tender, without nodularity LYMPH:  no palpable lymphadenopathy in the cervical, axillary or inguinal LUNGS: clear to auscultation and percussion with normal breathing effort HEART: regular rate & rhythm and no murmurs and no lower extremity edema ABDOMEN:abdomen soft, non-tender and normal bowel sounds MUSCULOSKELETAL:no cyanosis of digits and no clubbing  NEURO: alert & oriented x 3 with fluent speech, no focal motor/sensory deficits EXTREMITIES: No lower extremity edema  LABORATORY DATA:  I have reviewed the data as listed   Chemistry      Component Value Date/Time   NA 139 07/20/2015 0916   NA 138 02/08/2015 1156   K 3.6 07/20/2015 0916   K 3.7 02/08/2015 1156  CL 101 02/08/2015 1156   CO2 28 07/20/2015 0916   CO2 30 02/08/2015 1156   BUN 15.0 07/20/2015 0916   BUN 13 02/08/2015 1156   CREATININE 1.0 07/20/2015 0916   CREATININE 0.91 02/08/2015 1156      Component Value Date/Time   CALCIUM 9.8 07/20/2015 0916   CALCIUM 10.0 02/08/2015 1156   ALKPHOS 73 11/28/2015 1320   ALKPHOS 68 07/20/2015 0916   AST 31 11/28/2015 1320   AST 24 07/20/2015 0916   ALT 21 11/28/2015 1320   ALT 19 07/20/2015 0916   BILITOT 0.5 11/28/2015 1320   BILITOT 0.60 07/20/2015 0916   BILITOT 0.4 03/16/2013 0939       Lab Results  Component Value Date   WBC 6.9 11/28/2015   HGB 11.9 07/20/2015   HCT 36.1 11/28/2015   MCV 81 11/28/2015   PLT 297 11/28/2015   NEUTROABS 4.5 11/28/2015      ASSESSMENT & PLAN:  Breast cancer of lower-outer quadrant of right female breast Right breast lumpectomy 02/13/2015: margins negative; Invasive ductal carcinoma, G1, 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 1 out of 2 lymph nodes were positive for micrometastasis.Oncotype 12 (8% ROR) T1b N1 mic stage IB XRT from 03/20/15 to 04/14/2015  Current treatment: Anastrozole 1 mg daily started November 2016 Anastrozole toxicities: Occasional hot flashes  Right chest wall tenderness and right axillary lumpiness: Mammogram ultrasound and breast MRI did not reveal any evidence of recurrence.  Osteopenia: I encouraged her to take calcium and vitamin D.  Breast MRI 11/15/2015: No evidence of breast malignancy. Anterior and lateral skin thickening consistent with radiation change  Return to clinic in 1 year for follow-up (Dr. Marlou Starks will be seeing her in 6 months)   No orders of the defined types were placed in this encounter.   The patient has a good understanding of the overall plan. she agrees with it. she will call with any problems that may develop before the next visit here.   Rulon Eisenmenger, MD 01/18/2016

## 2016-01-18 NOTE — Telephone Encounter (Signed)
appt made and avs printed °

## 2016-01-20 ENCOUNTER — Other Ambulatory Visit: Payer: Self-pay | Admitting: Hematology and Oncology

## 2016-01-22 NOTE — Telephone Encounter (Signed)
Chart reviewed.

## 2016-01-29 ENCOUNTER — Other Ambulatory Visit: Payer: Self-pay | Admitting: Hematology and Oncology

## 2016-01-29 ENCOUNTER — Other Ambulatory Visit: Payer: Self-pay | Admitting: *Deleted

## 2016-02-01 ENCOUNTER — Other Ambulatory Visit: Payer: Self-pay | Admitting: Gastroenterology

## 2016-02-01 DIAGNOSIS — K5900 Constipation, unspecified: Secondary | ICD-10-CM

## 2016-02-01 DIAGNOSIS — R140 Abdominal distension (gaseous): Secondary | ICD-10-CM

## 2016-02-01 DIAGNOSIS — R109 Unspecified abdominal pain: Secondary | ICD-10-CM

## 2016-02-02 ENCOUNTER — Other Ambulatory Visit: Payer: Self-pay

## 2016-02-14 ENCOUNTER — Other Ambulatory Visit: Payer: Self-pay | Admitting: Gastroenterology

## 2016-02-14 ENCOUNTER — Ambulatory Visit
Admission: RE | Admit: 2016-02-14 | Discharge: 2016-02-14 | Disposition: A | Discharge status: Home / Self Care | Payer: Medicare Other | Source: Ambulatory Visit | Attending: Gastroenterology | Admitting: Gastroenterology

## 2016-02-14 ENCOUNTER — Other Ambulatory Visit: Payer: Self-pay | Admitting: Family Medicine

## 2016-02-14 ENCOUNTER — Ambulatory Visit
Admission: RE | Admit: 2016-02-14 | Discharge: 2016-02-14 | Disposition: A | Discharge status: Home / Self Care | Payer: Medicare Other | Source: Ambulatory Visit | Attending: Family Medicine | Admitting: Family Medicine

## 2016-02-14 DIAGNOSIS — R109 Unspecified abdominal pain: Secondary | ICD-10-CM

## 2016-02-14 DIAGNOSIS — R1084 Generalized abdominal pain: Secondary | ICD-10-CM

## 2016-02-14 DIAGNOSIS — R0789 Other chest pain: Secondary | ICD-10-CM

## 2016-02-14 DIAGNOSIS — K5900 Constipation, unspecified: Secondary | ICD-10-CM

## 2016-02-14 DIAGNOSIS — R140 Abdominal distension (gaseous): Secondary | ICD-10-CM

## 2016-02-15 ENCOUNTER — Encounter: Payer: Self-pay | Admitting: Podiatry

## 2016-02-15 ENCOUNTER — Ambulatory Visit (INDEPENDENT_AMBULATORY_CARE_PROVIDER_SITE_OTHER): Payer: Medicare Other | Admitting: Podiatry

## 2016-02-15 DIAGNOSIS — M722 Plantar fascial fibromatosis: Secondary | ICD-10-CM | POA: Diagnosis not present

## 2016-02-16 MED ORDER — NONFORMULARY OR COMPOUNDED ITEM: 2 refills | Status: DC

## 2016-02-16 NOTE — Progress Notes (Signed)
Subjective: 54 show female presents the office they for follow-up evaluation of bilateral heel pain. She states that she is still getting pain when she gets up and with activity. She denies any swelling or redness. No numbness or tingling. She has a states she's has had some problem the right knee. She is been told this is part of fibromyalgia but she feels that her foot pain in her knee pain is different. She has not been seen by orthopedic surgery at this time and she is requesting a possible referral. For her knee. Denies any systemic complaints such as fevers, chills, nausea, vomiting. No acute changes since last appointment, and no other complaints at this time.   Objective: AAO x3, NAD DP/PT pulses palpable bilaterally, CRT less than 3 seconds Tenderness to palpation along the plantar medial tubercle of the calcaneus at the insertion of plantar fascia on the left and right foot. There is no pain along the course of the plantar fascia within the arch of the foot. Plantar fascia appears to be intact. There is no pain with lateral compression of the calcaneus or pain with vibratory sensation. There is no pain along the course or insertion of the achilles tendon. No other areas of tenderness to bilateral lower extremities. MMT 5/5, ROM WNL.  No edema, erythema, increase in warmth to bilateral lower extremities.  No open lesions or pre-ulcerative lesions.  No pain with calf compression, swelling, warmth, erythema  Assessment: Bilateral heel pain, plantar fasciitis  Plan: -All treatment options discussed with the patient including all alternatives, risks, complications.  -Patient elects to proceed with steroid injection into the left and right heel. Under sterile skin preparation, a total of 2.5cc of kenalog 10, 0.5% Marcaine plain, and 2% lidocaine plain were infiltrated into the symptomatic area without complication. A band-aid was applied. Patient tolerated the injection well without complication.  Post-injection care with discussed with the patient. Discussed with the patient to ice the area over the next couple of days to help prevent a steroid flare.  -Fascial tapings were applied at her request. -Continue stretching, icing to this. Discussed supportive shoe gear. She does have orthotics that she does not like the way they fit I have returned him to [facility] to make the more narrow possible. Also prescribed physical therapy for her as well today. I also gave her the number for [facility] for her right knee. Follow-up with me as scheduled or as physical therapy or sooner if any issues are to arise. Call any questions or concerns meantime.  Celesta Gentile, DPM

## 2016-02-19 DIAGNOSIS — M722 Plantar fascial fibromatosis: Secondary | ICD-10-CM | POA: Insufficient documentation

## 2016-04-24 ENCOUNTER — Telehealth: Payer: Self-pay | Admitting: *Deleted

## 2016-04-24 DIAGNOSIS — M722 Plantar fascial fibromatosis: Secondary | ICD-10-CM

## 2016-04-24 MED ORDER — MELOXICAM 15 MG PO TABS: 15.0000 mg | ORAL_TABLET | Freq: Every day | ORAL | 0 refills | Status: DC

## 2016-04-24 NOTE — Telephone Encounter (Addendum)
Pt states she would like her pain medication called to [facility] on [address], but did not leave the name of the medication. 04/24/2016-I spoke with pt and she states she is taking Meloxicam at night and the Arthritis strength Tylenol through the day, and that helped until she ran out of Meloxicam.  I told pt I felt she would benefit from Meloxicam and an appt to see Dr. Jacqualyn Posey, since she was using Tylenol to cover the discomfort she was having the remainder of the day. Pt agreed and I transferred to schedulers. 04/29/2016-Pt states Dr. Jacqualyn Posey was going to refer to an orthopedic doctor, but no one has called.  I reviewed LOV and pt was given the [facility] office phone to make her appt.  I told pt she could make an appt without a referral, and pt states she thought it would go faster if we referred her.  I told pt I would make referral for right knee, right arm and pt states she would like to be seen for the plantar fasciitis in both feet. Referral faxed to [facility].

## 2016-04-30 NOTE — Telephone Encounter (Signed)
I will see her for the plantar fasciitis.

## 2016-05-06 ENCOUNTER — Other Ambulatory Visit: Payer: Self-pay | Admitting: Obstetrics and Gynecology

## 2016-05-06 ENCOUNTER — Other Ambulatory Visit (HOSPITAL_COMMUNITY)
Admission: RE | Admit: 2016-05-06 | Discharge: 2016-05-06 | Disposition: A | Discharge status: Home / Self Care | Payer: Medicare Other | Source: Ambulatory Visit | Attending: Obstetrics and Gynecology | Admitting: Obstetrics and Gynecology

## 2016-05-06 DIAGNOSIS — Z1151 Encounter for screening for human papillomavirus (HPV): Secondary | ICD-10-CM | POA: Insufficient documentation

## 2016-05-07 LAB — CERVICOVAGINAL ANCILLARY ONLY: HPV (WINDOPATH): NOT DETECTED

## 2016-05-09 ENCOUNTER — Ambulatory Visit: Payer: Medicare Other | Admitting: Podiatry

## 2016-10-07 ENCOUNTER — Ambulatory Visit: Payer: Medicare Other | Admitting: Podiatry

## 2016-10-08 ENCOUNTER — Ambulatory Visit: Payer: Medicare Other | Admitting: Podiatry

## 2016-10-19 ENCOUNTER — Ambulatory Visit (HOSPITAL_COMMUNITY)
Admission: EM | Admit: 2016-10-19 | Discharge: 2016-10-19 | Disposition: A | Discharge status: Home / Self Care | Payer: Medicare Other

## 2016-10-23 ENCOUNTER — Ambulatory Visit
Admission: RE | Admit: 2016-10-23 | Discharge: 2016-10-23 | Disposition: A | Discharge status: Home / Self Care | Payer: Medicare Other | Source: Ambulatory Visit | Attending: Family Medicine | Admitting: Family Medicine

## 2016-10-23 ENCOUNTER — Other Ambulatory Visit: Payer: Self-pay | Admitting: Family Medicine

## 2016-10-23 DIAGNOSIS — R0781 Pleurodynia: Secondary | ICD-10-CM

## 2016-12-10 ENCOUNTER — Encounter: Payer: Self-pay | Admitting: Sports Medicine

## 2016-12-10 ENCOUNTER — Ambulatory Visit (INDEPENDENT_AMBULATORY_CARE_PROVIDER_SITE_OTHER): Payer: Medicare Other

## 2016-12-10 ENCOUNTER — Ambulatory Visit (INDEPENDENT_AMBULATORY_CARE_PROVIDER_SITE_OTHER): Payer: Medicare Other | Admitting: Sports Medicine

## 2016-12-10 DIAGNOSIS — M79672 Pain in left foot: Secondary | ICD-10-CM | POA: Diagnosis not present

## 2016-12-10 DIAGNOSIS — L02619 Cutaneous abscess of unspecified foot: Secondary | ICD-10-CM | POA: Diagnosis not present

## 2016-12-10 DIAGNOSIS — M795 Residual foreign body in soft tissue: Secondary | ICD-10-CM

## 2016-12-10 DIAGNOSIS — L03119 Cellulitis of unspecified part of limb: Secondary | ICD-10-CM

## 2016-12-10 MED ORDER — AMOXICILLIN-POT CLAVULANATE 875-125 MG PO TABS: 1.0000 | ORAL_TABLET | Freq: Two times a day (BID) | ORAL | 0 refills | Status: DC

## 2016-12-10 MED ORDER — TRAMADOL HCL 50 MG PO TABS: 50.0000 mg | ORAL_TABLET | Freq: Three times a day (TID) | ORAL | 0 refills | Status: DC | PRN

## 2016-12-10 NOTE — Progress Notes (Signed)
Subjective: Shannon Hancock is a 66 y.o. female patient who presents to office for evaluation of left foot pain. Patient complains of progressive pain especially over the last 3 weeks in the ball of the left foot after stepping on a fish bone after cleaning fish and walking to trash barefoot. Patient states that she hasn't tried anything. No other issues.    Patient Active Problem List   Diagnosis Date Noted  . Plantar fasciitis 02/19/2016  . Family history of breast cancer in sister 03/10/2015  . Breast cancer of lower-outer quadrant of right female breast ([place]) 02/10/2015  . Abdominal bloating 12/16/2013  . Edema 11/09/2013  . Urge incontinence 05/05/2013  . Atrophic vaginitis 05/05/2013  . Syncope 02/15/2013  . Irritable bowel syndrome 08/20/2012  . Hearing loss 11/07/2011  . Generalized anxiety disorder 11/07/2011  . Peripheral neuropathy 07/05/2011  . Osteoporosis 07/05/2011  . Tinnitus, bilateral 04/05/2011  . HYPERCHOLESTEROLEMIA 04/05/2010  . ANEMIA, IRON DEFICIENCY 04/03/2010  . DEPRESSION, MAJOR, MODERATE 04/03/2010  . HYPERTENSION 04/03/2010  . ALLERGIC RHINITIS 04/03/2010  . 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 POSTMENOPAUSAL SYNDROME 04/03/2010  . 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 FIBROMYALGIA 04/03/2010    Current Outpatient Prescriptions on File Prior to Visit  Medication Sig Dispense Refill  . acetaminophen (TYLENOL) 325 MG tablet Take 650 mg by mouth every 6 (six) hours as needed.    . ALPRAZolam (XANAX) 1 MG tablet TAKE 1 TABLET BY MOUTH 4 TIMES A DAY AS NEEDED FOR SLEEP OR ANXIETY 120 tablet 0  . amLODipine (NORVASC) 5 MG tablet Take 5 mg by mouth daily.    Marland Kitchen anastrozole (ARIMIDEX) 1 MG tablet TAKE 1 TABLET (1 MG TOTAL) BY MOUTH DAILY. 90 tablet 3  . fexofenadine (ALLEGRA) 180 MG tablet Take 180 mg by mouth daily.    . Lactobacillus (PROBIOTIC ACIDOPHILUS PO) Take by mouth.    . losartan-hydrochlorothiazide  (HYZAAR) 100-12.5 MG per tablet TAKE 1 TABLET BY MOUTH DAILY. 30 tablet 0  . LYRICA 100 MG capsule TAKE ONE CAPSULE TWICE A DAY 60 capsule 3  . meloxicam (MOBIC) 15 MG tablet Take 1 tablet (15 mg total) by mouth daily. Reported on 11/15/2015 30 tablet 0  . naproxen sodium (ANAPROX) 220 MG tablet Take 220 mg by mouth as needed.    . NONFORMULARY OR COMPOUNDED ITEM [facility]:  Onychomycosis Nail lacquer - Fluconazole 2%, Terbinafine 1%, apply daily to affected areas. 120 each 2  . omeprazole (PRILOSEC) 20 MG capsule Take 20 mg by mouth daily.    . promethazine (PHENERGAN) 12.5 MG tablet Take 1 tablet (12.5 mg total) by mouth every 6 (six) hours as needed for nausea or vomiting. 30 tablet 0  . tizanidine (ZANAFLEX) 2 MG capsule Take 2 mg by mouth at bedtime as needed for muscle spasms.    Marland Kitchen ZETIA 10 MG tablet TAKE 1 TABLET BY MOUTH DAILY. 90 tablet 1   No current facility-administered medications on file prior to visit.     Allergies  Allergen Reactions  . Olive Oil Nausea And Vomiting    Severe  Nausea / Vomiting.  . Oxycodone Other (See Comments)    Patient reports itching    Objective:  General: Alert and oriented x3 in no acute distress  Dermatology: Pinpoint callus with entry wound at ball of left foot with mild fluctuance, no webspace macerations, no ecchymosis bilateral, all nails x 10 are well manicured.  Vascular: Dorsalis Pedis and Posterior Tibial pedal pulses  palpable, Capillary Fill Time 3 seconds,(+) pedal hair growth bilateral, no edema bilateral lower extremities, Temperature gradient within normal limits.  Neurology: Johney Maine sensation intact via light touch bilateral. (- )Tinels sign bilateral.   Musculoskeletal: Mild tenderness with palpation at left plantar forefoot at area of entry wound/callus from stepping on fish bone. Strength within normal limits in all groups bilateral.   Gait: Antalgic gait  Xrays  Left Foot   Impression:No clearly visualized foreign  body on xray, pes planus foot type.   Assessment and Plan: Problem List Items Addressed This Visit    None    Visit Diagnoses    Residual foreign body in soft tissue    -  Primary   Relevant Medications   traMADol (ULTRAM) 50 MG tablet   amoxicillin-clavulanate (AUGMENTIN) 875-125 MG tablet   Foot pain, left       Relevant Medications   traMADol (ULTRAM) 50 MG tablet   amoxicillin-clavulanate (AUGMENTIN) 875-125 MG tablet   Other Relevant Orders   DG Foot Complete Left (Completed)   Cellulitis and abscess of foot, except toes           -Complete examination performed -Xrays reviewed -Discussed treatement options for soft tissue foreign body -After verbal consent local field block using 3cc 1:1 mixture of lidocaine and marcine was injected to the ball at left foot. After betadine prep and confirming anesthesia using a 15 blade the callused over entry wound was incised and puss and a small fish bone fragment was removed. Following the area was dressed with offloading antibiotic dressing; I advised patient to dress the area and to soak with epsom salt for 1 week -Dispensed post op shoe -Rx Augmentin and Tramadol for pain  -Patient to return to office in 2 weeks for check of foreign body removal site or sooner if condition worsens.  Landis Martins, DPM

## 2016-12-10 NOTE — Patient Instructions (Signed)
Soak daily at least for 1 week with epsom salt and re-dress area with antibiotic cream and u-shape pad covered with bandaid  Take Augmentin antibiotic daily until you take them all  Dr Cannon Kettle will check area in 2 weeks after you have finished your antibiotics to make sure it is healing

## 2016-12-17 ENCOUNTER — Ambulatory Visit: Payer: Medicare Other | Admitting: Podiatry

## 2016-12-19 ENCOUNTER — Encounter: Payer: Self-pay | Admitting: Gastroenterology

## 2017-01-16 ENCOUNTER — Ambulatory Visit: Payer: Self-pay | Admitting: Hematology and Oncology

## 2017-01-16 NOTE — Assessment & Plan Note (Deleted)
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 1 out of 2 lymph nodes were positive for micrometastasis.Oncotype 12 (8% ROR) T1b N1 mic stage IB XRT from 03/20/15 to 04/14/2015  Current treatment: Anastrozole 1 mg daily started November 2016 Anastrozole toxicities: Occasional hot flashes  Right chest wall tendernessand right axillary lumpiness: Mammogram ultrasound and breast MRI did not reveal any evidence of recurrence.  Osteopenia: I encouraged her to take calcium and vitamin D.  Breast MRI 11/15/2015: No evidence of breast malignancy. Anterior and lateral skin thickening consistent with radiation change  Return to clinic in 1 year for follow-up (Dr. Toth will be seeing her in 6 months) 

## 2017-01-21 ENCOUNTER — Ambulatory Visit: Payer: Medicare Other | Admitting: Sports Medicine

## 2017-02-03 ENCOUNTER — Ambulatory Visit (HOSPITAL_BASED_OUTPATIENT_CLINIC_OR_DEPARTMENT_OTHER): Payer: Medicare Other | Admitting: Hematology and Oncology

## 2017-02-03 VITALS — BP 123/65 | HR 73 | Temp 98.4°F | Resp 18 | Ht 64.0 in | Wt 161.4 lb

## 2017-02-03 DIAGNOSIS — C50511 Malignant neoplasm of lower-outer quadrant of right female breast: Secondary | ICD-10-CM | POA: Diagnosis not present

## 2017-02-03 DIAGNOSIS — M8580 Other specified disorders of bone density and structure, unspecified site: Secondary | ICD-10-CM

## 2017-02-03 DIAGNOSIS — Z79811 Long term (current) use of aromatase inhibitors: Secondary | ICD-10-CM

## 2017-02-03 DIAGNOSIS — Z170 Estrogen receptor positive status [ER+]: Secondary | ICD-10-CM | POA: Diagnosis not present

## 2017-02-03 MED ORDER — ANASTROZOLE 1 MG PO TABS: ORAL_TABLET | ORAL | 3 refills | Status: DC

## 2017-02-03 MED ORDER — MELOXICAM 7.5 MG PO TABS: 7.5000 mg | ORAL_TABLET | Freq: Every day | ORAL | Status: DC

## 2017-02-03 NOTE — Assessment & Plan Note (Signed)
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 1 out of 2 lymph nodes were positive for micrometastasis.Oncotype 12 (8% ROR) T1b N1 mic stage IB XRT from 03/20/15 to 04/14/2015  Current treatment: Anastrozole 1 mg daily started November 2016 Anastrozole toxicities: Occasional hot flashes  Right chest wall tendernessand right axillary lumpiness: Mammogram ultrasound and breast MRI did not reveal any evidence of recurrence.  Osteopenia: I encouraged her to take calcium and vitamin D.  Breast MRI 11/15/2015: No evidence of breast malignancy. Anterior and lateral skin thickening consistent with radiation change  Return to clinic in 1 year for follow-up (Dr. Toth will be seeing her in 6 months) 

## 2017-02-03 NOTE — Progress Notes (Signed)
Patient Care Team: [name], MD as PCP - General [name], MD as Consulting Physician (General Surgery) Kyung Rudd, MD as Consulting Physician (Radiation Oncology) Sylvan Cheese, NP as Nurse Practitioner (Hematology and Oncology) Nicholas Lose, MD as Consulting Physician (Hematology and Oncology)  DIAGNOSIS:  Encounter Diagnosis  Name Primary?  . Malignant neoplasm of lower-outer quadrant of right breast of female, estrogen receptor positive ([place]) Yes    SUMMARY OF ONCOLOGIC HISTORY: Oncology History   Breast cancer of lower-outer quadrant of right female breast   Staging form: Breast, AJCC 7th Edition     Clinical: Stage IA (T1b, N0, M0) - Unsigned     Pathologic stage from 02/13/2015: Stage IB (T1b, N57m, cM0) - Unsigned        [place]   01/05/2015 Mammogram    Right breast: possible mass warranting further evaluation      01/05/2015 Breast MRI    10 mm diameter enhancing mass in the lower outer right breast suspicious for malignancy      01/18/2015 Mammogram    Diagnostic mammo and ultrasound showed a 0.6 cm mass in the right breast 8:00 location 6 cm from the nipple, and a 0.5 cm lesion in the 10:00 location of the right breast 6 cm from the nipple. Right axilla was negative for adenopathy.      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 1 out of 2 lymph nodes were positive for micrometastasis.      02/13/2015 Oncotype testing    RS 12 (8% ROR)      02/13/2015 Pathologic Stage    Stage IB: T1b N1(mi)      03/10/2015 Procedure    Breast/Ovarian Panel (Gene Dx): no clinically significant variant at ATM, BARD1, BRCA1, BRCA2, BRIP1, CDH1, CHEK2, FANCC, MLH1, MSH2, MSH6, NBN, PALB2, PMS2, PTEN, RAD51C, RAD51D, TP53, and XRCC2.       03/20/2015 - 04/14/2015 Radiation Therapy    Adjuvant XRT ([facility]: 42.5 Gy over 17 fractions to the breast using whole-breast tangent fields. Right breast boost 7.5 Gy over 3 fractions. Total dose: 50 Gy      04/19/2015 -  Anti-estrogen oral therapy    Anastrozole 1 mg daily. Planned duration of therapy 5-10 years.      07/05/2015 Survivorship    Survivorship care plan completed and mailed to patient in lieu of in person visit.      11/15/2015 Breast MRI    No evidence of breast malignancy. Anterior and lateral skin thickening consistent with radiation change       CHIEF COMPLIANT: follow-up on anastrozole therapy  INTERVAL HISTORY: Shannon Hancock a 66year old with above-mentioned history of right breast cancer treated with lumpectomy followed by adjuvant radiation and is currently on anastrozole therapy for the past 2 years. She is tolerating anastrozole extremely well. She does have intermittent mild hot flashes. Denies any myalgias.  REVIEW OF SYSTEMS:   Constitutional: Denies fevers, chills or abnormal weight loss Eyes: Denies blurriness of vision Ears, nose, mouth, throat, and face: Denies mucositis  or sore throat Respiratory: Denies cough, dyspnea or wheezes Cardiovascular: Denies palpitation, chest discomfort Gastrointestinal:  Denies nausea, heartburn or change in bowel habits Skin: Denies abnormal skin rashes Lymphatics: Denies new lymphadenopathy or easy bruising Neurological:Denies numbness, tingling or new weaknesses Behavioral/Psych: Mood is stable, no new changes  Extremities: No lower extremity edema Breast:  denies any pain or lumps or nodules in either breasts All other systems were reviewed with the patient and are negative.  I  have reviewed the past medical history, past surgical history, social history and family history with the patient and they are unchanged from previous note.  ALLERGIES:  is allergic to olive oil and oxycodone.  MEDICATIONS:  Current Outpatient Prescriptions  Medication Sig Dispense Refill  . acetaminophen (TYLENOL) 325 MG tablet Take 650 mg by mouth every 6 (six) hours as needed.    . ALPRAZolam (XANAX) 1 MG tablet TAKE 1 TABLET BY MOUTH 4 TIMES A DAY AS NEEDED FOR SLEEP OR ANXIETY 120 tablet 0  . amLODipine (NORVASC) 5 MG tablet Take 5 mg by mouth daily.    Marland Kitchen anastrozole (ARIMIDEX) 1 MG tablet TAKE 1 TABLET (1 MG TOTAL) BY MOUTH DAILY. 90 tablet 3  . fexofenadine (ALLEGRA) 180 MG tablet Take 180 mg by mouth daily.    . Lactobacillus (PROBIOTIC ACIDOPHILUS PO) Take by mouth.    . losartan-hydrochlorothiazide (HYZAAR) 100-12.5 MG per tablet TAKE 1 TABLET BY MOUTH DAILY. 30 tablet 0  . LYRICA 100 MG capsule TAKE ONE CAPSULE TWICE A DAY 60 capsule 3  . meloxicam (MOBIC) 7.5 MG tablet Take 1 tablet (7.5 mg total) by mouth daily. Reported on 11/15/2015    . NONFORMULARY OR COMPOUNDED ITEM [facility]:  Onychomycosis Nail lacquer - Fluconazole 2%, Terbinafine 1%, apply daily to affected areas. 120 each 2  . omeprazole (PRILOSEC) 20 MG capsule Take 20 mg by mouth daily.    . tizanidine (ZANAFLEX) 2 MG capsule Take 2 mg by mouth at bedtime as needed for muscle spasms.    Marland Kitchen ZETIA 10 MG tablet TAKE 1 TABLET BY MOUTH DAILY. 90 tablet 1   No current facility-administered medications for this visit.     PHYSICAL EXAMINATION: ECOG PERFORMANCE STATUS: 1 - Symptomatic but completely ambulatory  Vitals:   02/03/17 1219  BP: 123/65  Pulse: 73  Resp: 18  Temp: 98.4 F (36.9 C)   Filed Weights   02/03/17 1219  Weight: 161 lb 6.4 oz (73.2 kg)    GENERAL:alert, no distress and comfortable SKIN: skin color, texture, turgor are normal, no rashes or significant lesions EYES: normal, Conjunctiva  are pink and non-injected, sclera clear OROPHARYNX:no exudate, no erythema and lips, buccal mucosa, and tongue normal  NECK: supple, thyroid normal size, non-tender, without nodularity LYMPH:  no palpable lymphadenopathy in the cervical, axillary or inguinal LUNGS: clear to auscultation and percussion with normal breathing effort HEART: regular rate & rhythm and no murmurs and no lower extremity edema ABDOMEN:abdomen soft, non-tender and normal bowel sounds MUSCULOSKELETAL:no cyanosis of digits and no clubbing  NEURO: alert & oriented x 3 with fluent speech, no focal motor/sensory deficits EXTREMITIES: No lower extremity edema BREAST: No palpable masses or nodules in either right or left breasts. No palpable axillary supraclavicular or infraclavicular adenopathy no breast tenderness or nipple discharge. (exam performed in the presence of a chaperone)  LABORATORY DATA:  I have reviewed the data as listed   Chemistry      Component Value Date/Time   NA 139 07/20/2015 0916  K 3.6 07/20/2015 0916   CL 101 02/08/2015 1156   CO2 28 07/20/2015 0916   BUN 15.0 07/20/2015 0916   CREATININE 1.0 07/20/2015 0916      Component Value Date/Time   CALCIUM 9.8 07/20/2015 0916   ALKPHOS 73 11/28/2015 1320   ALKPHOS 68 07/20/2015 0916   AST 31 11/28/2015 1320   AST 24 07/20/2015 0916   ALT 21 11/28/2015 1320   ALT 19 07/20/2015 0916   BILITOT 0.5 11/28/2015 1320   BILITOT 0.60 07/20/2015 0916       Lab Results  Component Value Date   WBC 6.9 11/28/2015   HGB 11.7 11/28/2015   HCT 36.1 11/28/2015   MCV 81 11/28/2015   PLT 297 11/28/2015   NEUTROABS 4.5 11/28/2015    ASSESSMENT & PLAN:  Breast cancer of lower-outer quadrant of right female breast Right breast lumpectomy 02/13/2015: margins negative; Invasive ductal carcinoma, G1, 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 1 out of 2 lymph nodes were positive for micrometastasis.Oncotype 12 (8% ROR) T1b N1 mic stage IB XRT from 03/20/15  to 04/14/2015  Current treatment: Anastrozole 1 mg daily started November 2016 Anastrozole toxicities: Occasional hot flashes  Right chest wall tenderness and right axillary lumpiness: Mammogram ultrasound and breast MRI did not reveal any evidence of recurrence.  Osteopenia: I encouraged her to take calcium and vitamin D.  Breast MRI 11/15/2015: No evidence of breast malignancy. Anterior and lateral skin thickening consistent with radiation change  Return to clinic in 1 year for follow-up (Dr. Marlou Starks will be seeing her in 6 months)   I spent 15 minutes talking to the patient of which more than half was spent in counseling and coordination of care.  Orders Placed This Encounter  Procedures  . MM DIAG BREAST TOMO BILATERAL    Standing Status:   Future    Standing Expiration Date:   02/03/2018    Order Specific Question:   Reason for Exam (SYMPTOM  OR DIAGNOSIS REQUIRED)    Answer:   Annual mammograms for breast cancer    Order Specific Question:   Preferred imaging location?    Answer:   [facility]   The patient has a good understanding of the overall plan. she agrees with it. she will call with any problems that may develop before the next visit here.   Rulon Eisenmenger, MD 02/03/17

## 2017-02-19 ENCOUNTER — Ambulatory Visit
Admission: RE | Admit: 2017-02-19 | Discharge: 2017-02-19 | Disposition: A | Discharge status: Home / Self Care | Payer: Medicare Other | Source: Ambulatory Visit | Attending: Hematology and Oncology | Admitting: Hematology and Oncology

## 2017-02-19 DIAGNOSIS — Z170 Estrogen receptor positive status [ER+]: Principal | ICD-10-CM

## 2017-02-19 DIAGNOSIS — C50511 Malignant neoplasm of lower-outer quadrant of right female breast: Secondary | ICD-10-CM

## 2017-03-28 ENCOUNTER — Other Ambulatory Visit: Payer: Self-pay | Admitting: General Surgery

## 2017-03-28 DIAGNOSIS — C50511 Malignant neoplasm of lower-outer quadrant of right female breast: Secondary | ICD-10-CM

## 2017-04-23 ENCOUNTER — Ambulatory Visit
Admission: RE | Admit: 2017-04-23 | Discharge: 2017-04-23 | Disposition: A | Discharge status: Home / Self Care | Payer: Medicare Other | Source: Ambulatory Visit | Attending: General Surgery | Admitting: General Surgery

## 2017-04-23 DIAGNOSIS — C50511 Malignant neoplasm of lower-outer quadrant of right female breast: Secondary | ICD-10-CM

## 2017-04-23 MED ORDER — GADOBENATE DIMEGLUMINE 529 MG/ML IV SOLN
15.0000 mL | Freq: Once | INTRAVENOUS | Status: AC | PRN
  Administered 2017-04-23: 15 mL via INTRAVENOUS

## 2017-06-23 ENCOUNTER — Encounter: Payer: Self-pay | Admitting: Emergency Medicine

## 2017-06-23 ENCOUNTER — Emergency Department
Admission: EM | Admit: 2017-06-23 | Discharge: 2017-06-23 | Disposition: A | Discharge status: Home / Self Care | Payer: Medicare Other | Attending: Emergency Medicine | Admitting: Emergency Medicine

## 2017-06-23 ENCOUNTER — Emergency Department: Payer: Medicare Other

## 2017-06-23 DIAGNOSIS — Z853 Personal history of malignant neoplasm of breast: Secondary | ICD-10-CM | POA: Insufficient documentation

## 2017-06-23 DIAGNOSIS — I10 Essential (primary) hypertension: Secondary | ICD-10-CM | POA: Diagnosis not present

## 2017-06-23 DIAGNOSIS — Z79899 Other long term (current) drug therapy: Secondary | ICD-10-CM | POA: Insufficient documentation

## 2017-06-23 DIAGNOSIS — J209 Acute bronchitis, unspecified: Secondary | ICD-10-CM | POA: Diagnosis not present

## 2017-06-23 DIAGNOSIS — Z87891 Personal history of nicotine dependence: Secondary | ICD-10-CM | POA: Diagnosis not present

## 2017-06-23 DIAGNOSIS — R05 Cough: Secondary | ICD-10-CM | POA: Diagnosis present

## 2017-06-23 MED ORDER — IPRATROPIUM-ALBUTEROL 0.5-2.5 (3) MG/3ML IN SOLN
3.0000 mL | Freq: Once | RESPIRATORY_TRACT | Status: AC
  Administered 2017-06-23: 3 mL via RESPIRATORY_TRACT
  Filled 2017-06-23: qty 3

## 2017-06-23 MED ORDER — AZITHROMYCIN 250 MG PO TABS: ORAL_TABLET | ORAL | 0 refills | Status: AC

## 2017-06-23 MED ORDER — PREDNISONE 50 MG PO TABS: ORAL_TABLET | ORAL | 0 refills | Status: DC

## 2017-06-23 NOTE — ED Triage Notes (Signed)
Patient presents to ED via POV. Patient reports cough, fatigue and congestion x 1 week. Patient reports taking albuterol inhaler and allergy medication with little relief. Patient ambulatory to triage. Even and non labored respirations noted.

## 2017-06-23 NOTE — ED Triage Notes (Signed)
C/O cough and wheezing x 2 weeks.  Had been using an albuterol inhaler, which helped somewhat.  Denies C/O fever.

## 2017-06-23 NOTE — ED Provider Notes (Signed)
[facility] Emergency Department Provider Note  ____________________________________________  Time seen: Approximately 6:34 PM  I have reviewed the triage vital signs and the nursing notes.   HISTORY  Chief Complaint Cough    HPI Shannon Hancock is a 66 y.o. female presents to the emergency department with productive cough for approximately 2 weeks.  Patient reports that she has been wheezing at home.  Patient reports a history of asthma.  She denies chest pain.  No recent travel.  Patient reports wheezing improvement with albuterol usage.  She has been afebrile.   Past Medical History:  Diagnosis Date  . Allergy   . Anxiety   . Breast cancer ([place]) 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; ER/PR+, Her2-, ki67=10%  . Diverticulosis   . Fibromyalgia   . Heart murmur   . Hypertension     Patient Active Problem List   Diagnosis Date Noted  . Plantar fasciitis 02/19/2016  . Family history of breast cancer in sister 03/10/2015  . Breast cancer of lower-outer quadrant of right female breast ([place]) 02/10/2015  . Abdominal bloating 12/16/2013  . Edema 11/09/2013  . Urge incontinence 05/05/2013  . Atrophic vaginitis 05/05/2013  . Syncope 02/15/2013  . Irritable bowel syndrome 08/20/2012  . Hearing loss 11/07/2011  . Generalized anxiety disorder 11/07/2011  . Peripheral neuropathy 07/05/2011  . Osteoporosis 07/05/2011  . Tinnitus, bilateral 04/05/2011  . HYPERCHOLESTEROLEMIA 04/05/2010  . ANEMIA, IRON DEFICIENCY 04/03/2010  . DEPRESSION, MAJOR, MODERATE 04/03/2010  . HYPERTENSION 04/03/2010  . ALLERGIC RHINITIS 04/03/2010  . 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 POSTMENOPAUSAL SYNDROME 04/03/2010  . 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 FIBROMYALGIA 04/03/2010    Past Surgical History:  Procedure Laterality Date  . BREAST LUMPECTOMY WITH RADIOACTIVE SEED AND SENTINEL LYMPH NODE BIOPSY Right 02/13/2015    Procedure: RIGHT BREAST LUMPECTOMY WITH RADIOACTIVE SEED AND SENTINEL LYMPH NODE MAPPING;  Surgeon: [name], MD;  Location: [facility];  Service: General;  Laterality: Right;  . FOOT SURGERY    . TUBAL LIGATION      Prior to Admission medications   Medication Sig Start Date End Date Taking? Authorizing Provider  acetaminophen (TYLENOL) 325 MG tablet Take 650 mg by mouth every 6 (six) hours as needed.    [provider]  ALPRAZolam (XANAX) 1 MG tablet TAKE 1 TABLET BY MOUTH 4 TIMES A DAY AS NEEDED FOR SLEEP OR ANXIETY    Copland, Spencer, MD  amLODipine (NORVASC) 5 MG tablet Take 5 mg by mouth daily.    [provider]  anastrozole (ARIMIDEX) 1 MG tablet TAKE 1 TABLET (1 MG TOTAL) BY MOUTH DAILY. 02/03/17   Nicholas Lose, MD  azithromycin (ZITHROMAX Z-PAK) 250 MG tablet Take 2 tablets (500 mg) on  Day 1,  followed by 1 tablet (250 mg) once daily on Days 2 through 5. 06/23/17 06/28/17  Lannie Fields, PA-C  fexofenadine (ALLEGRA) 180 MG tablet Take 180 mg by mouth daily.    [provider]  Lactobacillus (PROBIOTIC ACIDOPHILUS PO) Take by mouth.    [provider]  losartan-hydrochlorothiazide (HYZAAR) 100-12.5 MG per tablet TAKE 1 TABLET BY MOUTH DAILY. 01/10/15   Bedsole, Amy E, MD  LYRICA 100 MG capsule TAKE ONE CAPSULE TWICE A DAY 06/17/13   Bedsole, Amy E, MD  meloxicam (MOBIC) 7.5 MG tablet Take 1 tablet (7.5 mg total) by mouth daily. Reported on 11/15/2015 02/03/17   Nicholas Lose, MD  NONFORMULARY OR  COMPOUNDED ITEM [facility]:  Onychomycosis Nail lacquer - Fluconazole 2%, Terbinafine 1%, apply daily to affected areas. 02/16/16   Trula Slade, DPM  omeprazole (PRILOSEC) 20 MG capsule Take 20 mg by mouth daily.    [provider]  predniSONE (DELTASONE) 50 MG tablet Take one 50 mg tablet once a day for 5 days. 06/23/17   Lannie Fields, PA-C  tizanidine (ZANAFLEX) 2 MG capsule Take 2 mg by mouth at bedtime as needed  for muscle spasms.    [provider]  ZETIA 10 MG tablet TAKE 1 TABLET BY MOUTH DAILY. 06/12/14   Jinny Sanders, MD    Allergies Olive oil and Oxycodone  Family History  Problem Relation Age of Onset  . Alzheimer's disease Mother   . Stroke Mother   . Coronary artery disease Father   . Hypertension Father   . Depression Father   . Prostate cancer Father 60  . Colon polyps Father        unspecified number  . Breast cancer Sister 27  . Colon polyps Sister        3-4 total  . Hypertension Brother   . Hyperlipidemia Brother   . Prostate cancer Brother 31  . Breast cancer Maternal Aunt        dx. 72s  . Kidney cancer Cousin        dx. 10s; smoker while in college  . Lung cancer Maternal Uncle        smoker  . Cancer Paternal Aunt        unspecified type  . Cirrhosis Paternal Uncle   . Colon cancer Maternal Grandmother 68  . Cancer Maternal Grandfather        unspecified type  . Parkinson's disease Maternal Grandfather   . Kidney cancer Paternal Grandfather        dx. 77s  . Breast cancer Cousin   . Lung cancer Other        MGM's sister  . Breast cancer Other   . Lung cancer Other   . Breast cancer Paternal Aunt 76  . Congestive Heart Failure Paternal Uncle   . Diabetes Paternal Uncle     Social History Social History   Tobacco Use  . Smoking status: Former Smoker    Packs/day: 0.00    Years: 4.00    Pack years: 0.00    Types: Cigarettes    Last attempt to quit: 07/01/1972    Years since quitting: 45.0  . Smokeless tobacco: Never Used  . Tobacco comment: only smoked in college - up to 1 ppd  Substance Use Topics  . Alcohol use: No  . Drug use: No     Review of Systems  Constitutional: No fever/chills Eyes: No visual changes. No discharge ENT: No upper respiratory complaints. Cardiovascular: no chest pain. Respiratory: Patient has productive cough. No SOB. Gastrointestinal: No abdominal pain.  No nausea, no vomiting.  No diarrhea.  No  constipation. Musculoskeletal: Negative for musculoskeletal pain. Skin: Negative for rash, abrasions, lacerations, ecchymosis. Neurological: Negative for headaches, focal weakness or numbness.   ____________________________________________   PHYSICAL EXAM:  VITAL SIGNS: ED Triage Vitals  Enc Vitals Group     BP 06/23/17 1341 (!) 121/57     Pulse Rate 06/23/17 1341 80     Resp 06/23/17 1341 16     Temp 06/23/17 1341 98 F (36.7 C)     Temp Source 06/23/17 1341 Oral     SpO2 06/23/17 1341 97 %  Weight 06/23/17 1341 154 lb (69.9 kg)     Height 06/23/17 1341 '5\' 4"'$  (1.626 m)     Head Circumference --      Peak Flow --      Pain Score 06/23/17 1601 0     Pain Loc --      Pain Edu? --      Excl. in [place]? --      Constitutional: Alert and oriented. Patient is lying supine. Eyes: Conjunctivae are normal. PERRL. EOMI. Head: Atraumatic. ENT:      Ears: Tympanic membranes are mildly injected with mild effusion bilaterally.       Nose: No congestion/rhinnorhea.      Mouth/Throat: Mucous membranes are moist. Posterior pharynx is mildly erythematous.  Hematological/Lymphatic/Immunilogical: No cervical lymphadenopathy.  Cardiovascular: Normal rate, regular rhythm. Normal S1 and S2.  Good peripheral circulation. Respiratory: Normal respiratory effort without tachypnea or retractions. Lungs CTAB. Good air entry to the bases with no decreased or absent breath sounds. Gastrointestinal: Bowel sounds 4 quadrants. Soft and nontender to palpation. No guarding or rigidity. No palpable masses. No distention. No CVA tenderness. Musculoskeletal: Full range of motion to all extremities. No gross deformities appreciated. Neurologic:  Normal speech and language. No gross focal neurologic deficits are appreciated.  Skin:  Skin is warm, dry and intact. No rash noted. Psychiatric: Mood and affect are normal. Speech and behavior are normal. Patient exhibits appropriate insight and  judgement.    ____________________________________________   LABS (all labs ordered are listed, but only abnormal results are displayed)  Labs Reviewed - No data to display ____________________________________________  EKG   ____________________________________________  RADIOLOGY Unk Pinto, personally viewed and evaluated these images (plain radiographs) as part of my medical decision making, as well as reviewing the written report by the radiologist.  Dg Chest 2 View  Result Date: 06/23/2017 CLINICAL DATA:  66 year old with 2 week history of cough and fatigue. Personal history of right breast cancer. EXAM: CHEST  2 VIEW COMPARISON:  10/23/2016, 02/14/2016 and earlier. FINDINGS: Cardiomediastinal silhouette unremarkable, unchanged. Mildly prominent bronchovascular markings diffusely and mild central peribronchial thickening, more so than on the prior examinations. Lungs otherwise clear. No localized airspace consolidation. No pleural effusions. No pneumothorax. Normal pulmonary vascularity. Mild degenerative changes involving the midthoracic spine. IMPRESSION: Mild changes of acute bronchitis and/or asthma without focal airspace pneumonia. Electronically Signed   By: Evangeline Dakin M.D.   On: 06/23/2017 15:12    ____________________________________________    PROCEDURES  Procedure(s) performed:    Procedures    Medications  ipratropium-albuterol (DUONEB) 0.5-2.5 (3) MG/3ML nebulizer solution 3 mL (3 mLs Nebulization Given 06/23/17 1534)     ____________________________________________   INITIAL IMPRESSION / ASSESSMENT AND PLAN / ED COURSE  Pertinent labs & imaging results that were available during my care of the patient were reviewed by me and considered in my medical decision making (see chart for details).  Review of the [place] CSRS was performed in accordance of the [place] prior to dispensing any controlled drugs.    Assessment and plan Acute  bronchitis Patient presents to the emergency department with productive cough and intermittent shortness of breath for the past 2 weeks.  History and physical exam findings are consistent with acute bronchitis.  Patient was discharged with azithromycin and prednisone.  Chest x-ray revealed no consolidations.  Vital signs are reassuring prior to discharge.    ____________________________________________  FINAL CLINICAL IMPRESSION(S) / ED DIAGNOSES  Final diagnoses:  Acute bronchitis, unspecified organism  NEW MEDICATIONS STARTED DURING THIS VISIT:  ED Discharge Orders        Ordered    azithromycin (ZITHROMAX Z-PAK) 250 MG tablet     06/23/17 1531    predniSONE (DELTASONE) 50 MG tablet     06/23/17 1531          This chart was dictated using voice recognition software/Dragon. Despite best efforts to proofread, errors can occur which can change the meaning. Any change was purely unintentional.    Lannie Fields, PA-C 06/23/17 Gabriela Eves, MD 07/03/17 [phone]

## 2017-06-23 NOTE — ED Notes (Signed)
See triage note  States she developed a cough about 2 weeks ago  Sx's became worse this week unsure of fever  States has had some wheezing which is eased off with use of inhalers

## 2017-07-08 ENCOUNTER — Ambulatory Visit: Payer: Medicare Other | Admitting: Sports Medicine

## 2018-01-30 ENCOUNTER — Other Ambulatory Visit: Payer: Self-pay

## 2018-01-30 ENCOUNTER — Other Ambulatory Visit: Payer: Self-pay | Admitting: Hematology and Oncology

## 2018-01-30 DIAGNOSIS — C50511 Malignant neoplasm of lower-outer quadrant of right female breast: Secondary | ICD-10-CM

## 2018-01-30 DIAGNOSIS — Z170 Estrogen receptor positive status [ER+]: Principal | ICD-10-CM

## 2018-01-30 DIAGNOSIS — Z853 Personal history of malignant neoplasm of breast: Secondary | ICD-10-CM

## 2018-02-03 ENCOUNTER — Telehealth: Payer: Self-pay

## 2018-02-03 ENCOUNTER — Inpatient Hospital Stay: Payer: Medicare Other | Admitting: Hematology and Oncology

## 2018-02-03 ENCOUNTER — Telehealth: Payer: Self-pay | Admitting: Hematology and Oncology

## 2018-02-03 NOTE — Assessment & Plan Note (Deleted)
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 1 out of 2 lymph nodes were positive for micrometastasis.Oncotype 12 (8% ROR) T1b N1 mic stage IB XRT from 03/20/15 to 04/14/2015  Current treatment: Anastrozole 1 mg daily started November 2016 Anastrozole toxicities: Occasional hot flashes  Breast cancer surveillance: 1.  Mammogram scheduled for 02/04/2018  Osteopenia: I encouraged her to take calcium and vitamin D.  Return to clinic in 1 year for follow-up

## 2018-02-03 NOTE — Telephone Encounter (Signed)
Returned patient's call regarding appointments.  Patient is scheduled for MRI and Mammogram on 02/20/18 and 02/24/2018. Needs to reschedule appointment with Dr. Lindi Adie for a date after scan and mammogram is complete.  High priority scheduling message sent.  Patient aware scheduling will get in contact with new appointment date and time.  No further needs at this time.

## 2018-02-03 NOTE — Telephone Encounter (Signed)
Called pt re appts that were added per 8/6 sch msg - spoke w/ pt and confirmed appts.

## 2018-02-06 ENCOUNTER — Other Ambulatory Visit (HOSPITAL_COMMUNITY): Payer: Self-pay | Admitting: Family Medicine

## 2018-02-06 DIAGNOSIS — E049 Nontoxic goiter, unspecified: Secondary | ICD-10-CM

## 2018-02-09 ENCOUNTER — Ambulatory Visit (HOSPITAL_COMMUNITY)
Admission: RE | Admit: 2018-02-09 | Discharge: 2018-02-09 | Disposition: A | Discharge status: Home / Self Care | Payer: Medicare Other | Source: Ambulatory Visit | Attending: Family Medicine | Admitting: Family Medicine

## 2018-02-09 DIAGNOSIS — E049 Nontoxic goiter, unspecified: Secondary | ICD-10-CM | POA: Diagnosis present

## 2018-02-19 ENCOUNTER — Other Ambulatory Visit: Payer: Self-pay | Admitting: Hematology and Oncology

## 2018-02-20 ENCOUNTER — Ambulatory Visit
Admission: RE | Admit: 2018-02-20 | Discharge: 2018-02-20 | Disposition: A | Discharge status: Home / Self Care | Payer: Medicare Other | Source: Ambulatory Visit | Attending: Hematology and Oncology | Admitting: Hematology and Oncology

## 2018-02-20 DIAGNOSIS — Z853 Personal history of malignant neoplasm of breast: Secondary | ICD-10-CM

## 2018-02-24 ENCOUNTER — Ambulatory Visit
Admission: RE | Admit: 2018-02-24 | Discharge: 2018-02-24 | Disposition: A | Discharge status: Home / Self Care | Payer: Medicare Other | Source: Ambulatory Visit | Attending: Hematology and Oncology | Admitting: Hematology and Oncology

## 2018-02-24 DIAGNOSIS — Z170 Estrogen receptor positive status [ER+]: Principal | ICD-10-CM

## 2018-02-24 DIAGNOSIS — C50511 Malignant neoplasm of lower-outer quadrant of right female breast: Secondary | ICD-10-CM

## 2018-02-24 MED ORDER — GADOBENATE DIMEGLUMINE 529 MG/ML IV SOLN
15.0000 mL | Freq: Once | INTRAVENOUS | Status: AC | PRN
  Administered 2018-02-24: 15 mL via INTRAVENOUS

## 2018-02-26 ENCOUNTER — Ambulatory Visit: Payer: Medicare Other | Admitting: Internal Medicine

## 2018-02-26 ENCOUNTER — Telehealth: Payer: Self-pay | Admitting: Hematology and Oncology

## 2018-02-26 ENCOUNTER — Encounter: Payer: Self-pay | Admitting: Internal Medicine

## 2018-02-26 ENCOUNTER — Inpatient Hospital Stay: Payer: Medicare Other | Attending: Hematology and Oncology | Admitting: Hematology and Oncology

## 2018-02-26 VITALS — BP 121/66 | HR 67 | Temp 98.6°F | Resp 19 | Ht 64.0 in | Wt 163.1 lb

## 2018-02-26 VITALS — BP 136/80 | HR 77 | Ht 64.0 in | Wt 162.4 lb

## 2018-02-26 DIAGNOSIS — M8580 Other specified disorders of bone density and structure, unspecified site: Secondary | ICD-10-CM | POA: Insufficient documentation

## 2018-02-26 DIAGNOSIS — Z79899 Other long term (current) drug therapy: Secondary | ICD-10-CM | POA: Insufficient documentation

## 2018-02-26 DIAGNOSIS — Z79811 Long term (current) use of aromatase inhibitors: Secondary | ICD-10-CM | POA: Diagnosis not present

## 2018-02-26 DIAGNOSIS — E041 Nontoxic single thyroid nodule: Secondary | ICD-10-CM | POA: Diagnosis not present

## 2018-02-26 DIAGNOSIS — Z1231 Encounter for screening mammogram for malignant neoplasm of breast: Secondary | ICD-10-CM

## 2018-02-26 DIAGNOSIS — Z780 Asymptomatic menopausal state: Secondary | ICD-10-CM

## 2018-02-26 DIAGNOSIS — Z170 Estrogen receptor positive status [ER+]: Secondary | ICD-10-CM | POA: Diagnosis not present

## 2018-02-26 DIAGNOSIS — Z923 Personal history of irradiation: Secondary | ICD-10-CM | POA: Diagnosis not present

## 2018-02-26 DIAGNOSIS — C50511 Malignant neoplasm of lower-outer quadrant of right female breast: Secondary | ICD-10-CM | POA: Diagnosis present

## 2018-02-26 DIAGNOSIS — N951 Menopausal and female climacteric states: Secondary | ICD-10-CM | POA: Insufficient documentation

## 2018-02-26 MED ORDER — CHOLECALCIFEROL 50 MCG (2000 UT) PO CHEW: 3.0000 | CHEWABLE_TABLET | Freq: Every day | ORAL | Status: DC

## 2018-02-26 MED ORDER — ANASTROZOLE 1 MG PO TABS: ORAL_TABLET | ORAL | 3 refills | Status: DC

## 2018-02-26 NOTE — Progress Notes (Signed)
Patient Care Team: [name], MD as PCP - General Shannon Kussmaul, MD as Consulting Physician (General Surgery) Kyung Rudd, MD as Consulting Physician (Radiation Oncology) Sylvan Cheese, NP as Nurse Practitioner (Hematology and Oncology) Nicholas Lose, MD as Consulting Physician (Hematology and Oncology)  DIAGNOSIS:  Encounter Diagnoses  Name Primary?  . Malignant neoplasm of lower-outer quadrant of right breast of female, estrogen receptor positive ([place])   . Encounter for screening mammogram for malignant neoplasm of breast   . Post-menopausal Yes    SUMMARY OF ONCOLOGIC HISTORY: Oncology History   Breast cancer of lower-outer quadrant of right female breast   Staging form: Breast, AJCC 7th Edition     Clinical: Stage IA (T1b, N0, M0) - Unsigned     Pathologic stage from 02/13/2015: Stage IB (T1b, N61m, cM0) - Unsigned        [place]   01/05/2015 Mammogram    Right breast: possible mass warranting further evaluation    01/05/2015 Breast MRI    10 mm diameter enhancing mass in the lower outer right breast suspicious for malignancy    01/18/2015 Mammogram    Diagnostic mammo and ultrasound showed a 0.6 cm mass in the right breast 8:00 location 6 cm from the nipple, and a 0.5 cm lesion in the 10:00 location of the right breast 6 cm from the nipple. Right axilla was negative for adenopathy.    01/19/2015 Initial Biopsy    Right breast 8:00 mass biopsy showed invasive ductal carcinoma and DCIS, grade 1-2, the 10:00 mass biopsy showed fibrocystic change.ER 100% positive, PR 90% positive, HER-2 negative, Ki-67 10%    01/19/2015 Clinical Stage    Stage IA: T1b N0    02/13/2015 Definitive Surgery    Right breast lumpectomy: margins negative; Invasive ductal carcinoma, G1, 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 1 out of 2 lymph nodes were positive for micrometastasis.    02/13/2015 Oncotype testing    RS 12  (8% ROR)    02/13/2015 Pathologic Stage    Stage IB: T1b N1(mi)    03/10/2015 Procedure    Breast/Ovarian Panel (Gene Dx): no clinically significant variant at ATM, BARD1, BRCA1, BRCA2, BRIP1, CDH1, CHEK2, FANCC, MLH1, MSH2, MSH6, NBN, PALB2, PMS2, PTEN, RAD51C, RAD51D, TP53, and XRCC2.     03/20/2015 - 04/14/2015 Radiation Therapy    Adjuvant XRT ([place]: 42.5 Gy over 17 fractions to the breast using whole-breast tangent fields. Right breast boost 7.5 Gy over 3 fractions. Total dose: 50 Gy    04/19/2015 -  Anti-estrogen oral therapy    Anastrozole 1 mg daily. Planned duration of therapy 5-10 years.    07/05/2015 Survivorship    Survivorship care plan completed and mailed to patient in lieu of in person visit.    11/15/2015 Breast MRI    No evidence of breast malignancy. Anterior and lateral skin thickening consistent with radiation change     CHIEF COMPLIANT: Surveillance of breast cancer on anastrozole therapy  INTERVAL HISTORY: Shannon MOLCHANis a 89-yearwith above-mentioned history of right breast cancer treated with lumpectomy followed by radiation and is currently on anastrozole.  She is tolerating anastrozole fairly well.  She does have some hot flashes.  Denies any arthralgias or myalgias.  Denies any lumps or nodules in the breast.  She had a recent mammogram and MRI which were normal.  REVIEW OF SYSTEMS:   Constitutional: Denies fevers, chills or abnormal weight loss Eyes: Denies  blurriness of vision Ears, nose, mouth, throat, and face: Denies mucositis or sore throat Respiratory: Denies cough, dyspnea or wheezes Cardiovascular: Denies palpitation, chest discomfort Gastrointestinal:  Denies nausea, heartburn or change in bowel habits Skin: Denies abnormal skin rashes Lymphatics: Denies new lymphadenopathy or easy bruising Neurological:Denies numbness, tingling or new weaknesses Behavioral/Psych: Mood is stable, no new changes  Extremities: No lower extremity  edema Breast:  denies any pain or lumps or nodules in either breasts All other systems were reviewed with the patient and are negative.  I have reviewed the past medical history, past surgical history, social history and family history with the patient and they are unchanged from previous note.  ALLERGIES:  is allergic to olive oil and oxycodone.  MEDICATIONS:  Current Outpatient Medications  Medication Sig Dispense Refill  . acetaminophen (TYLENOL) 325 MG tablet Take 650 mg by mouth every 6 (six) hours as needed.    . ALPRAZolam (XANAX) 1 MG tablet TAKE 1 TABLET BY MOUTH 4 TIMES A DAY AS NEEDED FOR SLEEP OR ANXIETY 120 tablet 0  . amLODipine (NORVASC) 5 MG tablet Take 5 mg by mouth daily.    Marland Kitchen anastrozole (ARIMIDEX) 1 MG tablet TAKE 1 TABLET BY MOUTH EVERY DAY 90 tablet 3  . Cholecalciferol 2000 units CHEW Chew 3 tablets by mouth daily. 30 tablet   . fexofenadine (ALLEGRA) 180 MG tablet Take 180 mg by mouth daily.    . Lactobacillus (PROBIOTIC ACIDOPHILUS PO) Take by mouth.    . losartan-hydrochlorothiazide (HYZAAR) 100-12.5 MG per tablet TAKE 1 TABLET BY MOUTH DAILY. 30 tablet 0  . LYRICA 100 MG capsule TAKE ONE CAPSULE TWICE A DAY 60 capsule 3  . meloxicam (MOBIC) 7.5 MG tablet Take 1 tablet (7.5 mg total) by mouth daily. Reported on 11/15/2015    . NONFORMULARY OR COMPOUNDED ITEM [facility]:  Onychomycosis Nail lacquer - Fluconazole 2%, Terbinafine 1%, apply daily to affected areas. 120 each 2  . omeprazole (PRILOSEC) 20 MG capsule Take 20 mg by mouth daily.    . tizanidine (ZANAFLEX) 2 MG capsule Take 2 mg by mouth at bedtime as needed for muscle spasms.    Marland Kitchen ZETIA 10 MG tablet TAKE 1 TABLET BY MOUTH DAILY. 90 tablet 1   No current facility-administered medications for this visit.     PHYSICAL EXAMINATION: ECOG PERFORMANCE STATUS: 0 - Asymptomatic  Vitals:   02/26/18 1050  BP: 121/66  Pulse: 67  Resp: 19  Temp: 98.6 F (37 C)  SpO2: 98%   Filed Weights   02/26/18  1050  Weight: 163 lb 1.6 oz (74 kg)    GENERAL:alert, no distress and comfortable SKIN: skin color, texture, turgor are normal, no rashes or significant lesions EYES: normal, Conjunctiva are pink and non-injected, sclera clear OROPHARYNX:no exudate, no erythema and lips, buccal mucosa, and tongue normal  NECK: supple, thyroid normal size, non-tender, without nodularity LYMPH:  no palpable lymphadenopathy in the cervical, axillary or inguinal LUNGS: clear to auscultation and percussion with normal breathing effort HEART: regular rate & rhythm and no murmurs and no lower extremity edema ABDOMEN:abdomen soft, non-tender and normal bowel sounds MUSCULOSKELETAL:no cyanosis of digits and no clubbing  NEURO: alert & oriented x 3 with fluent speech, no focal motor/sensory deficits EXTREMITIES: No lower extremity edema BREAST: No palpable masses or nodules in either right or left breasts. No palpable axillary supraclavicular or infraclavicular adenopathy no breast tenderness or nipple discharge. (exam performed in the presence of a chaperone)  LABORATORY DATA:  I  have reviewed the data as listed CMP Latest Ref Rng & Units 11/28/2015 07/20/2015 04/19/2015  Glucose 70 - 140 mg/dl - 89 80  BUN 7.0 - 26.0 mg/dL - 15.0 14.6  Creatinine 0.6 - 1.1 mg/dL - 1.0 0.8  Sodium 136 - 145 mEq/L - 139 140  Potassium 3.5 - 5.1 mEq/L - 3.6 3.6  Chloride 101 - 111 mmol/L - - -  CO2 22 - 29 mEq/L - 28 26  Calcium 8.4 - 10.4 mg/dL - 9.8 9.9  Total Protein 6.0 - 8.5 g/dL 7.4 7.7 7.7  Total Bilirubin 0.0 - 1.2 mg/dL 0.5 0.60 0.39  Alkaline Phos 39 - 117 IU/L 73 68 70  AST 0 - 40 IU/L _0 ALT 0 - 32 IU/L _1 Lab Results  Component Value Date   WBC 6.9 11/28/2015   HGB 11.7 11/28/2015   HCT 36.1 11/28/2015   MCV 81 11/28/2015   PLT 297 11/28/2015   NEUTROABS 4.5 11/28/2015    ASSESSMENT & PLAN:  Breast cancer of lower-outer quadrant of right female breast Right breast lumpectomy 02/13/2015:  margins negative; Invasive ductal carcinoma, G1, 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 1 out of 2 lymph nodes were positive for micrometastasis.Oncotype 12 (8% ROR) T1b N1 mic stage IB XRT from 03/20/15 to 04/14/2015  Current treatment: Anastrozole 1 mg daily started November 2016 Anastrozole toxicities: Occasional hot flashes  Right chest wall tendernessand right axillary lumpiness: Mammogram ultrasound and breast MRI did not reveal any evidence of recurrence.  Osteopenia: She needs a new bone density test.  I ordered a bone density to be done at the breast center in the next month.  Breast MRI August 2019: No evidence of breast malignancy.  Mammogram August 2019: No evidence of malignancy Return to clinic in 1 year for follow-up     Orders Placed This Encounter  Procedures  . MR BREAST BILATERAL W WO CONTRAST INC CAD    Standing Status:   Future    Standing Expiration Date:   04/29/2019    Order Specific Question:   ** REASON FOR EXAM (FREE TEXT)    Answer:   High risk > 20% risk of breast cancer    Order Specific Question:   If indicated for the ordered procedure, I authorize the administration of contrast media per Radiology protocol    Answer:   Yes    Order Specific Question:   What is the patient's sedation requirement?    Answer:   No Sedation    Order Specific Question:   Does the patient have a pacemaker or implanted devices?    Answer:   No    Order Specific Question:   Radiology Contrast Protocol - do NOT remove file path    Answer:   [email or web address]    Order Specific Question:   Preferred imaging location?    Answer:   [facility] (table limit-350 lbs)  . DG Bone Density    Standing Status:   Future    Standing Expiration Date:   04/02/2019    Order Specific Question:   Reason for exam:    Answer:   Post menopausal on anti estrogen therapy    Order Specific Question:   Preferred imaging location?    Answer:   [facility]   The patient has a good understanding of the overall plan. she agrees with it. she will call with any problems that may develop before the next visit  here.   Harriette Ohara, MD 02/26/18

## 2018-02-26 NOTE — Assessment & Plan Note (Signed)
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 1 out of 2 lymph nodes were positive for micrometastasis.Oncotype 12 (8% ROR) T1b N1 mic stage IB XRT from 03/20/15 to 04/14/2015  Current treatment: Anastrozole 1 mg daily started November 2016 Anastrozole toxicities: Occasional hot flashes  Right chest wall tendernessand right axillary lumpiness: Mammogram ultrasound and breast MRI did not reveal any evidence of recurrence.  Osteopenia: I encouraged her to take calcium and vitamin D.  Breast MRI 11/15/2015: No evidence of breast malignancy. Anterior and lateral skin thickening consistent with radiation change  Return to clinic in 1 year for follow-up (Dr. Marlou Starks will be seeing her in 6 months)

## 2018-02-26 NOTE — Progress Notes (Addendum)
Patient ID: Shannon Hancock, female   DOB: [birth year], 67 y.o.   MRN: [id number]    HPI  Shannon Hancock is a 67 y.o.-year-old female, referred by her PCP, Dr. Marijo File, for evaluation for thyroid nodules.  Patient was seen by Dr. Rockwell Germany on 02/06/2018 and she was complaining of neck compression symptoms.  At that time, she was referred to have a thyroid ultrasound.  Thyroid U/S (02/09/2018): Normal-sized thyroid with bilateral small colloidal nodules: Isthmus: 0.3 cm Right lobe: 4.0 cm x 1.7 cm x 1.0 cm Left lobe: 4.2 cm x 1.3 cm x 1.2 cm  Bilateral colloid nodules of the thyroid, none of which meet criteria for surveillance or biopsy.  Pt mentions: - feeling a R nodule in lower neck - Occasional hoarseness  But not: - dysphagia - choking - SOB with lying down  I reviewed pt's thyroid tests: 12/01/2017: TSH 1.21 Lab Results  Component Value Date   TSH 0.62 03/16/2013   TSH 0.74 07/05/2011    Pt mentions: - + fatigue - + irritability - + migraine HAs - + weight gain  She denies: - heat intolerance/cold intolerance - tremors - palpitations - anxiety/depression - hyperdefecation/constipation - dry skin - hair loss  No FH of thyroid ds. No FH of thyroid cancer. No h/o radiation tx to head or neck.  No seaweed or kelp. No recent contrast studies. + steroid use - recent shoulder inj 4 days ago. No herbal supplements. No Biotin supplements or Hair, Skin and Nails vitamins.  Pt also has a history of Br CA - s/p RxTX, fbmyalgia, OA, HTN, HL, anxiety, depression.  ROS: Constitutional: + See HPI  Eyes: no blurry vision, no xerophthalmia ENT: no sore throat,  + see HPI, + tinnitus, + hypoacusis Cardiovascular: no CP/SOB/palpitations/leg swelling Respiratory: + Cough/no SOB Gastrointestinal: no N/V/D/C/+ heartburn Musculoskeletal: + Both muscle/joint aches Skin: no rashes Neurological: no tremors/numbness/tingling/dizziness, + headache Psychiatric: + Both  depression/anxiety + Low libido  Past Medical History:  Diagnosis Date  . Allergy   . Anxiety   . 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 Diverticulosis   . Fibromyalgia   . Heart murmur   . Hypertension    Past Surgical History:  Procedure Laterality Date  . BREAST LUMPECTOMY WITH RADIOACTIVE SEED AND SENTINEL LYMPH NODE BIOPSY Right 02/13/2015   Procedure: RIGHT BREAST LUMPECTOMY WITH RADIOACTIVE SEED AND SENTINEL LYMPH NODE MAPPING;  Surgeon: [name], MD;  Location: [facility];  Service: General;  Laterality: Right;  . FOOT SURGERY    . TUBAL LIGATION     Social History   Socioeconomic History  . Marital status: Married    Spouse name: Not on file  . Number of children: 2  . Years of education: Not on file  . Highest education level: Not on file  Occupational History  . 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 Financial resource strain: Not on file  . Food insecurity:    Worry: Not on file    Inability: Not on file  . Transportation needs:    Medical: Not on file    Non-medical: Not on file  Tobacco Use  . Smoking status: Former Smoker    Packs/day: 0.00    Years: 4.00    Pack years: 0.00    Types: Cigarettes    Last attempt to quit:  07/01/1972    Years since quitting: 45.6  . Smokeless tobacco: Never Used  . Tobacco comment: only smoked in college - up to 1 ppd  Substance and Sexual Activity  . Alcohol use: No  . Drug use: No  Lifestyle  Relationships  Social History Narrative   Regular exercise-no   Living will: None (reviewed 2014)   Current Outpatient Medications on File Prior to Visit  Medication Sig Dispense Refill  . acetaminophen (TYLENOL) 325 MG tablet Take 650 mg by mouth every 6 (six) hours as needed.    . ALPRAZolam (XANAX) 1 MG tablet TAKE 1 TABLET BY MOUTH 4 TIMES A DAY AS NEEDED FOR SLEEP OR ANXIETY 120 tablet  0  . amLODipine (NORVASC) 5 MG tablet Take 5 mg by mouth daily.    Marland Kitchen anastrozole (ARIMIDEX) 1 MG tablet TAKE 1 TABLET BY MOUTH EVERY DAY 90 tablet 3  . Cholecalciferol 2000 units CHEW Chew 3 tablets by mouth daily. 30 tablet   . fexofenadine (ALLEGRA) 180 MG tablet Take 180 mg by mouth daily.    . Lactobacillus (PROBIOTIC ACIDOPHILUS PO) Take by mouth.    . losartan-hydrochlorothiazide (HYZAAR) 100-12.5 MG per tablet TAKE 1 TABLET BY MOUTH DAILY. 30 tablet 0  . LYRICA 100 MG capsule TAKE ONE CAPSULE TWICE A DAY 60 capsule 3  . meloxicam (MOBIC) 7.5 MG tablet Take 1 tablet (7.5 mg total) by mouth daily. Reported on 11/15/2015    . NONFORMULARY OR COMPOUNDED ITEM [facility]:  Onychomycosis Nail lacquer - Fluconazole 2%, Terbinafine 1%, apply daily to affected areas. 120 each 2  . omeprazole (PRILOSEC) 20 MG capsule Take 20 mg by mouth daily.    . tizanidine (ZANAFLEX) 2 MG capsule Take 2 mg by mouth at bedtime as needed for muscle spasms.    Marland Kitchen ZETIA 10 MG tablet TAKE 1 TABLET BY MOUTH DAILY. 90 tablet 1   No current facility-administered medications on file prior to visit.    Allergies  Allergen Reactions  . Olive Oil Nausea And Vomiting    Severe  Nausea / Vomiting.  . Oxycodone Other (See Comments)    Patient reports itching   Family History  Problem Relation Age of Onset  . Alzheimer's disease Mother   . Stroke Mother   . Coronary artery disease Father   . Hypertension Father   . Depression Father   . Prostate cancer Father 39  . Colon polyps Father        unspecified number  . Breast cancer Sister 9  . Colon polyps Sister        3-4 total  . Hypertension Brother   . Hyperlipidemia Brother   . Prostate cancer Brother 40  . Breast cancer Maternal Aunt        dx. 82s  . Kidney cancer Cousin        dx. 16s; smoker while in college  . Lung cancer Maternal Uncle        smoker  . Cancer Paternal Aunt        unspecified type  . Cirrhosis Paternal Uncle   . Colon  cancer Maternal Grandmother 21  . Cancer Maternal Grandfather        unspecified type  . Parkinson's disease Maternal Grandfather   . Kidney cancer Paternal Grandfather        dx. 67s  . Breast cancer Cousin   . Lung cancer Other        MGM's sister  . Breast cancer Other   .  Lung cancer Other   . Breast cancer Paternal Aunt 10  . Congestive Heart Failure Paternal Uncle   . Diabetes Paternal Uncle     PE: BP 136/80   Pulse 77   Ht 5' 4"  (1.626 m)   Wt 162 lb 6.4 oz (73.7 kg)   SpO2 95%   BMI 27.88 kg/m  Wt Readings from Last 3 Encounters:  02/26/18 162 lb 6.4 oz (73.7 kg)  02/26/18 163 lb 1.6 oz (74 kg)  06/23/17 154 lb (69.9 kg)   Constiutional: Slightly overweight, in NAD Eyes: PERRLA, EOMI, no exophthalmos ENT: moist mucous membranes, no thyromegaly, right thyroid nodule not easily palpable, but pulsations in right lower neck visible, no cervical lymphadenopathy Cardiovascular: RRR, No MRG Respiratory: CTA B Gastrointestinal: abdomen soft, NT, ND, BS+ Musculoskeletal: no deformities, strength intact in all 4;  Skin: moist, warm, no rashes Neurological: no tremor with outstretched hands, DTR normal in all 4  ASSESSMENT: 1.  Thyroid cyst  PLAN: 1.  - I reviewed the images and the report of her thyroid ultrasound along with the patient. I pointed out that her thyroid size is normal (therefore, no goiter) and she does not have significant thyroid nodules.  She has small colloid nodules observed are very common and not worrisome.  1 of the colloid cysts is slightly larger and is situated anterior in the right lower thyroid lobe.  The carotid arteries behind it and it is very likely that the pulsations of this artery are transmitted through the colloid of the nodule and therefore become visible.  She is bothered by the pulsations and is wondering whether something could be done about this.  We discussed about the potential for draining the nodule, but I explained that this is  so small, that it may not be amenable for drainage.  She does want to have this procedure done, if possible.  I will order it to be done in [facility].  I explained that if drainage could not be done, there is not much else that we can do for the nodule.  She agrees to continue to just observe it to see if it grows.  If the drainage could be done, I will see her back if the nodule starts to re-form and starts bothering her again. -We also discussed about possible thyroid nodule worrisome features (which she does not have): -Hypoechogenicity (her cysts are almost on echogenic due to being filled with fluid) -Internal microcalcifications -internal blood flow -Nodules more tall than wide than tall -With invaginations in the surrounding tissue - Pt does not have a thyroid cancer family history or a personal history of RxTx to head/neck. All these would favor benignity.  - We also discussed that the high TSH may be a risk factor for thyroid cancer, however, previous TSH levels including the one obtained in 11/2017 were normal - Upon her questioning, her thyroid is not responsible for her fatigue, irritability, and weight gain, since her thyroid nodules are extremely small and since her thyroid test have been repeatedly normal -I will see her back as needed.  Reading Physician Reading Date Result Priority  Greggory Keen, MD 03/26/2018     Narrative    CLINICAL DATA: Thyroid nodule pulsatile right neck mass  EXAM: THYROID ULTRASOUND  TECHNIQUE: Ultrasound examination of the thyroid gland and adjacent soft tissues was performed.  COMPARISON: 02/09/2018  FINDINGS: Parenchymal Echotexture: Normal  Ultrasound of the thyroid demonstrates stable subcentimeter right thyroid cysts (3). These all measure 7 mm  or less in size and would not meet criteria for any biopsy or follow-up.  Visible right neck pulsatile abnormality on exam correlates with normal tortuosity of the right common  carotid origin extending from the sternal notch into the right neck. _________________________________________________________  No other significant abnormality by ultrasound. No finding that warrants biopsy or follow-up.  IMPRESSION: Right neck pulsatile abnormality on clinical exam correlates with normal tortuosity of the right common carotid artery extending from the sternal notch into the right neck.  Benign-appearing right thyroid subcentimeter colloid cysts.  No indication for follow-up, biopsy or aspiration.  The above is in keeping with the ACR TI-RADS recommendations - J Am Coll Radiol 2017;14:587-595.   Electronically Signed By: Jerilynn Mages. Shick M.D. On: 03/26/2018 16:58     Philemon Kingdom, MD PhD [facility] Endocrinology

## 2018-02-26 NOTE — Patient Instructions (Addendum)
We will schedule a thyroid cyst drainage for you.  Please call me in 1 week if you do not hear from [facility].  Please return to see me as needed.

## 2018-02-26 NOTE — Telephone Encounter (Signed)
Gave avs and c °

## 2018-02-26 NOTE — Telephone Encounter (Signed)
Gave avs and calendar ° °

## 2018-03-11 ENCOUNTER — Telehealth: Payer: Self-pay | Admitting: Emergency Medicine

## 2018-03-11 NOTE — Telephone Encounter (Signed)
[facility] called and wanted to let you know the patient cancelled her biopsy today. Thanks.

## 2018-03-11 NOTE — Telephone Encounter (Signed)
FYI below.

## 2018-03-12 ENCOUNTER — Other Ambulatory Visit: Payer: Self-pay

## 2018-03-18 ENCOUNTER — Ambulatory Visit: Payer: Self-pay | Admitting: Internal Medicine

## 2018-03-26 ENCOUNTER — Other Ambulatory Visit: Payer: Self-pay | Admitting: Internal Medicine

## 2018-03-26 ENCOUNTER — Ambulatory Visit
Admission: RE | Admit: 2018-03-26 | Discharge: 2018-03-26 | Disposition: A | Discharge status: Home / Self Care | Payer: Medicare Other | Source: Ambulatory Visit | Attending: Internal Medicine | Admitting: Internal Medicine

## 2018-03-26 DIAGNOSIS — E041 Nontoxic single thyroid nodule: Secondary | ICD-10-CM

## 2018-03-27 NOTE — Progress Notes (Signed)
Notified patient of message from Dr. Gherghe, patient expressed understanding and agreement. No further questions.  

## 2018-04-23 ENCOUNTER — Ambulatory Visit
Admission: RE | Admit: 2018-04-23 | Discharge: 2018-04-23 | Disposition: A | Discharge status: Home / Self Care | Payer: Medicare Other | Source: Ambulatory Visit | Attending: Hematology and Oncology | Admitting: Hematology and Oncology

## 2018-04-23 DIAGNOSIS — Z780 Asymptomatic menopausal state: Secondary | ICD-10-CM

## 2018-06-04 ENCOUNTER — Other Ambulatory Visit: Payer: Self-pay | Admitting: Family Medicine

## 2018-06-04 DIAGNOSIS — I739 Peripheral vascular disease, unspecified: Principal | ICD-10-CM

## 2018-06-04 DIAGNOSIS — I779 Disorder of arteries and arterioles, unspecified: Secondary | ICD-10-CM

## 2019-01-14 ENCOUNTER — Other Ambulatory Visit: Payer: Self-pay | Admitting: Hematology and Oncology

## 2019-01-14 DIAGNOSIS — Z1231 Encounter for screening mammogram for malignant neoplasm of breast: Secondary | ICD-10-CM

## 2019-02-19 ENCOUNTER — Other Ambulatory Visit: Payer: Self-pay | Admitting: Hematology and Oncology

## 2019-02-19 DIAGNOSIS — Z853 Personal history of malignant neoplasm of breast: Secondary | ICD-10-CM

## 2019-02-22 ENCOUNTER — Telehealth: Payer: Self-pay | Admitting: Hematology and Oncology

## 2019-02-22 NOTE — Telephone Encounter (Signed)
I talk with patient and she prefers to come into the building

## 2019-02-22 NOTE — Assessment & Plan Note (Signed)
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 1 out of 2 lymph nodes were positive for micrometastasis.Oncotype 12 (8% ROR) T1b N1 mic stage IB XRT from 03/20/15 to 04/14/2015  Current treatment: Anastrozole 1 mg daily started November 2016 Anastrozole toxicities: Occasional hot flashes  Right chest wall tendernessand right axillary lumpiness: Mammogram ultrasound and breast MRI did not reveal any evidence of recurrence.  Osteopenia:  Bone density 04/23/2018: T score -1.5: Osteopenia, continue with calcium and vitamin D and weightbearing exercises  Breast cancer surveillance: 1. Breast MRI  02/26/2019: No evidence of breast malignancy.  2. Mammogram 02/23/2019: No evidence of malignancy  Return to clinic in 1 year for follow-up

## 2019-02-23 ENCOUNTER — Ambulatory Visit
Admission: RE | Admit: 2019-02-23 | Discharge: 2019-02-23 | Disposition: A | Discharge status: Home / Self Care | Payer: Medicare Other | Source: Ambulatory Visit | Attending: Hematology and Oncology | Admitting: Hematology and Oncology

## 2019-02-23 ENCOUNTER — Other Ambulatory Visit: Payer: Self-pay

## 2019-02-23 DIAGNOSIS — Z853 Personal history of malignant neoplasm of breast: Secondary | ICD-10-CM

## 2019-02-26 ENCOUNTER — Ambulatory Visit (HOSPITAL_COMMUNITY): Admission: RE | Admit: 2019-02-26 | Payer: Medicare Other | Source: Ambulatory Visit

## 2019-02-27 ENCOUNTER — Other Ambulatory Visit: Payer: Self-pay | Admitting: Hematology and Oncology

## 2019-02-28 NOTE — Progress Notes (Signed)
Patient Care Team: [name], MD as PCP - General [name], MD as Consulting Physician (General Surgery) Kyung Rudd, MD as Consulting Physician (Radiation Oncology) Sylvan Cheese, NP as Nurse Practitioner (Hematology and Oncology) Nicholas Lose, MD as Consulting Physician (Hematology and Oncology)  DIAGNOSIS:    ICD-10-CM   1. Malignant neoplasm of lower-outer quadrant of right breast of female, estrogen receptor positive ([place])  C50.511    Z17.0     SUMMARY OF ONCOLOGIC HISTORY: Oncology History Overview Note  Breast cancer of lower-outer quadrant of right female breast   Staging form: Breast, AJCC 7th Edition     Clinical: Stage IA (T1b, N0, M0) - Unsigned     Pathologic stage from 02/13/2015: Stage IB (T1b, N87m, cM0) - Unsigned      [place]  01/05/2015 Mammogram   Right breast: possible mass warranting further evaluation   01/05/2015 Breast MRI   10 mm diameter enhancing mass in the lower outer right breast suspicious for malignancy   01/18/2015 Mammogram   Diagnostic mammo and ultrasound showed a 0.6 cm mass in the right breast 8:00 location 6 cm from the nipple, and a 0.5 cm lesion in the 10:00 location of the right breast 6 cm from the nipple. Right axilla was negative for adenopathy.   01/19/2015 Initial Biopsy   Right breast 8:00 mass biopsy showed invasive ductal carcinoma and DCIS, grade 1-2, the 10:00 mass biopsy showed fibrocystic change.ER 100% positive, PR 90% positive, HER-2 negative, Ki-67 10%   01/19/2015 Clinical Stage   Stage IA: T1b N0   02/13/2015 Definitive Surgery   Right breast lumpectomy: margins negative; Invasive ductal carcinoma, G1, 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 1 out of 2 lymph nodes were positive for micrometastasis.   02/13/2015 Oncotype testing   RS 12 (8% ROR)   02/13/2015 Pathologic Stage   Stage IB: T1b N1(mi)   03/10/2015 Procedure   Breast/Ovarian  Panel (Gene Dx): no clinically significant variant at ATM, BARD1, BRCA1, BRCA2, BRIP1, CDH1, CHEK2, FANCC, MLH1, MSH2, MSH6, NBN, PALB2, PMS2, PTEN, RAD51C, RAD51D, TP53, and XRCC2.    03/20/2015 - 04/14/2015 Radiation Therapy   Adjuvant XRT ([facility]: 42.5 Gy over 17 fractions to the breast using whole-breast tangent fields. Right breast boost 7.5 Gy over 3 fractions. Total dose: 50 Gy   04/19/2015 -  Anti-estrogen oral therapy   Anastrozole 1 mg daily. Planned duration of therapy 5-10 years.   07/05/2015 Survivorship   Survivorship care plan completed and mailed to patient in lieu of in person visit.   11/15/2015 Breast MRI   No evidence of breast malignancy. Anterior and lateral skin thickening consistent with radiation change     CHIEF COMPLIANT: Surveillance of breast cancer on anastrozole therapy  INTERVAL HISTORY: BTHETA LEAFis a 68y.o. with above-mentioned history of right breast cancer treated with lumpectomy, radiation, and who is currently on anastrozole. I last saw her a year ago. Mammogram on 02/23/19 showed no evidence of malignancy bilaterally. She presents to the clinic today for annual follow-up.  She is complaining of severe pain in her muscles and joints.  Her primary care physician sent her to orthopedics.  She was asking if it is related to anastrozole therapy.  She has been having it for the past 4 years and only gotten worse.  REVIEW OF SYSTEMS:   Constitutional: Denies fevers, chills or abnormal weight loss Eyes: Denies blurriness of vision Ears, nose, mouth,  throat, and face: Denies mucositis or sore throat Respiratory: Denies cough, dyspnea or wheezes Cardiovascular: Denies palpitation, chest discomfort Gastrointestinal: Denies nausea, heartburn or change in bowel habits Skin: Denies abnormal skin rashes Lymphatics: Denies new lymphadenopathy or easy bruising Neurological: Denies numbness, tingling or new weaknesses Behavioral/Psych: Mood is stable, no new  changes  Extremities: No lower extremity edema, complains of diffuse muscle aches and pains Breast: denies any pain or lumps or nodules in either breasts All other systems were reviewed with the patient and are negative.  I have reviewed the past medical history, past surgical history, social history and family history with the patient and they are unchanged from previous note.  ALLERGIES:  is allergic to olive oil and oxycodone.  MEDICATIONS:  Current Outpatient Medications  Medication Sig Dispense Refill  . acetaminophen (TYLENOL) 325 MG tablet Take 650 mg by mouth every 6 (six) hours as needed.    . ALPRAZolam (XANAX) 1 MG tablet TAKE 1 TABLET BY MOUTH 4 TIMES A DAY AS NEEDED FOR SLEEP OR ANXIETY 120 tablet 0  . amLODipine (NORVASC) 5 MG tablet Take 5 mg by mouth daily.    Marland Kitchen anastrozole (ARIMIDEX) 1 MG tablet TAKE 1 TABLET BY MOUTH EVERY DAY 90 tablet 3  . Cholecalciferol 2000 units CHEW Chew 3 tablets by mouth daily. 30 tablet   . fexofenadine (ALLEGRA) 180 MG tablet Take 180 mg by mouth daily.    . Lactobacillus (PROBIOTIC ACIDOPHILUS PO) Take by mouth.    . losartan-hydrochlorothiazide (HYZAAR) 100-12.5 MG per tablet TAKE 1 TABLET BY MOUTH DAILY. 30 tablet 0  . LYRICA 100 MG capsule TAKE ONE CAPSULE TWICE A DAY 60 capsule 3  . meloxicam (MOBIC) 7.5 MG tablet Take 1 tablet (7.5 mg total) by mouth daily. Reported on 11/15/2015    . NONFORMULARY OR COMPOUNDED ITEM [facility]:  Onychomycosis Nail lacquer - Fluconazole 2%, Terbinafine 1%, apply daily to affected areas. 120 each 2  . omeprazole (PRILOSEC) 20 MG capsule Take 20 mg by mouth daily.    . tizanidine (ZANAFLEX) 2 MG capsule Take 2 mg by mouth at bedtime as needed for muscle spasms.    Marland Kitchen ZETIA 10 MG tablet TAKE 1 TABLET BY MOUTH DAILY. 90 tablet 1   No current facility-administered medications for this visit.     PHYSICAL EXAMINATION: ECOG PERFORMANCE STATUS: 1 - Symptomatic but completely ambulatory  Vitals:    03/01/19 0838  BP: 120/63  Pulse: 75  Resp: 17  Temp: 97.8 F (36.6 C)  SpO2: 100%   Filed Weights   03/01/19 0838  Weight: 168 lb 4.8 oz (76.3 kg)    GENERAL: alert, no distress and comfortable SKIN: skin color, texture, turgor are normal, no rashes or significant lesions EYES: normal, Conjunctiva are pink and non-injected, sclera clear OROPHARYNX: no exudate, no erythema and lips, buccal mucosa, and tongue normal  NECK: supple, thyroid normal size, non-tender, without nodularity LYMPH: no palpable lymphadenopathy in the cervical, axillary or inguinal LUNGS: clear to auscultation and percussion with normal breathing effort HEART: regular rate & rhythm and no murmurs and no lower extremity edema ABDOMEN: abdomen soft, non-tender and normal bowel sounds MUSCULOSKELETAL: no cyanosis of digits and no clubbing  NEURO: alert & oriented x 3 with fluent speech, no focal motor/sensory deficits EXTREMITIES: No lower extremity edema   LABORATORY DATA:  I have reviewed the data as listed CMP Latest Ref Rng & Units 11/28/2015 07/20/2015 04/19/2015  Glucose 70 - 140 mg/dl - 89 80  BUN  7.0 - 26.0 mg/dL - 15.0 14.6  Creatinine 0.6 - 1.1 mg/dL - 1.0 0.8  Sodium 136 - 145 mEq/L - 139 140  Potassium 3.5 - 5.1 mEq/L - 3.6 3.6  Chloride 101 - 111 mmol/L - - -  CO2 22 - 29 mEq/L - 28 26  Calcium 8.4 - 10.4 mg/dL - 9.8 9.9  Total Protein 6.0 - 8.5 g/dL 7.4 7.7 7.7  Total Bilirubin 0.0 - 1.2 mg/dL 0.5 0.60 0.39  Alkaline Phos 39 - 117 IU/L 73 68 70  AST 0 - 40 IU/L 31 24 25   ALT 0 - 32 IU/L 21 19 24     Lab Results  Component Value Date   WBC 6.9 11/28/2015   HGB 11.7 11/28/2015   HCT 36.1 11/28/2015   MCV 81 11/28/2015   PLT 297 11/28/2015   NEUTROABS 4.5 11/28/2015    ASSESSMENT & PLAN:  Breast cancer of lower-outer quadrant of right female breast Right breast lumpectomy 02/13/2015: margins negative; Invasive ductal carcinoma, G1, 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 1 out of 2  lymph nodes were positive for micrometastasis.Oncotype 12 (8% ROR) T1b N1 mic stage IB XRT from 03/20/15 to 04/14/2015  Current treatment: Anastrozole 1 mg daily started November 2016 Anastrozole toxicities: Diffuse arthralgias and myalgias: Patient tells me that she has been having it for the past 4 years.  I instructed her to stop anastrozole at this time and 2 weeks after that we will start her on letrozole if her symptoms improve.  I sent her a prescription for 30 days of letrozole.  If she tolerate letrozole better than we can give her 90-day supply.  If she does not tolerate letrozole either then we will consider tamoxifen.  Right chest wall tendernessand right axillary lumpiness: Mammogram ultrasound and breast MRI did not reveal any evidence of recurrence.  Osteopenia:  Bone density 04/23/2018: T score -1.5: Osteopenia, continue with calcium and vitamin D and weightbearing exercises  Breast cancer surveillance: 1. Breast MRI  02/26/2019: No evidence of breast malignancy.  2. Mammogram 02/23/2019: No evidence of malignancy  Return to clinic in 1 year for follow-up    No orders of the defined types were placed in this encounter.  The patient has a good understanding of the overall plan. she agrees with it. she will call with any problems that may develop before the next visit here.  Nicholas Lose, MD 03/01/2019  Julious Oka Dorshimer am acting as scribe for Dr. Nicholas Lose.  I have reviewed the above documentation for accuracy and completeness, and I agree with the above.

## 2019-03-01 ENCOUNTER — Other Ambulatory Visit: Payer: Self-pay

## 2019-03-01 ENCOUNTER — Inpatient Hospital Stay: Payer: Medicare Other | Attending: Hematology and Oncology | Admitting: Hematology and Oncology

## 2019-03-01 ENCOUNTER — Ambulatory Visit (HOSPITAL_COMMUNITY): Payer: Medicare Other

## 2019-03-01 DIAGNOSIS — M791 Myalgia, unspecified site: Secondary | ICD-10-CM | POA: Diagnosis not present

## 2019-03-01 DIAGNOSIS — Z885 Allergy status to narcotic agent status: Secondary | ICD-10-CM | POA: Insufficient documentation

## 2019-03-01 DIAGNOSIS — Z79899 Other long term (current) drug therapy: Secondary | ICD-10-CM | POA: Diagnosis not present

## 2019-03-01 DIAGNOSIS — M8580 Other specified disorders of bone density and structure, unspecified site: Secondary | ICD-10-CM | POA: Insufficient documentation

## 2019-03-01 DIAGNOSIS — Z170 Estrogen receptor positive status [ER+]: Secondary | ICD-10-CM | POA: Diagnosis not present

## 2019-03-01 DIAGNOSIS — C50511 Malignant neoplasm of lower-outer quadrant of right female breast: Secondary | ICD-10-CM

## 2019-03-01 DIAGNOSIS — R0789 Other chest pain: Secondary | ICD-10-CM | POA: Insufficient documentation

## 2019-03-01 DIAGNOSIS — Z79811 Long term (current) use of aromatase inhibitors: Secondary | ICD-10-CM | POA: Insufficient documentation

## 2019-03-01 DIAGNOSIS — M2550 Pain in unspecified joint: Secondary | ICD-10-CM | POA: Insufficient documentation

## 2019-03-01 DIAGNOSIS — Z923 Personal history of irradiation: Secondary | ICD-10-CM | POA: Diagnosis not present

## 2019-03-01 MED ORDER — LETROZOLE 2.5 MG PO TABS: 2.5000 mg | ORAL_TABLET | Freq: Every day | ORAL | 0 refills | Status: DC

## 2019-03-01 MED ORDER — METAMUCIL 0.52 G PO CAPS: 0.5200 g | ORAL_CAPSULE | Freq: Every day | ORAL | Status: DC

## 2019-03-02 ENCOUNTER — Telehealth: Payer: Self-pay | Admitting: Hematology and Oncology

## 2019-03-02 NOTE — Telephone Encounter (Signed)
I talk with patient regarding schedule  

## 2019-03-03 ENCOUNTER — Ambulatory Visit (HOSPITAL_COMMUNITY)
Admission: RE | Admit: 2019-03-03 | Discharge: 2019-03-03 | Disposition: A | Discharge status: Home / Self Care | Payer: Medicare Other | Source: Ambulatory Visit | Attending: Hematology and Oncology | Admitting: Hematology and Oncology

## 2019-03-03 ENCOUNTER — Other Ambulatory Visit: Payer: Self-pay

## 2019-03-03 DIAGNOSIS — Z1231 Encounter for screening mammogram for malignant neoplasm of breast: Secondary | ICD-10-CM | POA: Insufficient documentation

## 2019-03-03 LAB — POCT I-STAT CREATININE: Creatinine, Ser: 1 mg/dL (ref 0.44–1.00)

## 2019-03-03 MED ORDER — GADOBUTROL 1 MMOL/ML IV SOLN
7.0000 mL | Freq: Once | INTRAVENOUS | Status: AC | PRN
  Administered 2019-03-03: 19:00:00 7 mL via INTRAVENOUS

## 2019-03-04 ENCOUNTER — Telehealth: Payer: Self-pay | Admitting: Hematology and Oncology

## 2019-03-04 NOTE — Telephone Encounter (Signed)
I left a voicemail for the patient that the breast MRI was normal.

## 2019-04-03 ENCOUNTER — Other Ambulatory Visit: Payer: Self-pay | Admitting: Hematology and Oncology

## 2019-04-24 ENCOUNTER — Other Ambulatory Visit: Payer: Self-pay | Admitting: Hematology and Oncology

## 2019-05-20 ENCOUNTER — Other Ambulatory Visit: Payer: Self-pay | Admitting: Hematology and Oncology

## 2019-06-15 ENCOUNTER — Other Ambulatory Visit: Payer: Self-pay | Admitting: Hematology and Oncology

## 2019-06-28 ENCOUNTER — Other Ambulatory Visit: Payer: Self-pay | Admitting: Sports Medicine

## 2019-06-28 DIAGNOSIS — M25561 Pain in right knee: Secondary | ICD-10-CM

## 2019-07-07 ENCOUNTER — Other Ambulatory Visit: Payer: Self-pay | Admitting: Hematology and Oncology

## 2019-07-11 ENCOUNTER — Other Ambulatory Visit: Payer: Medicare PPO

## 2019-07-11 NOTE — Progress Notes (Signed)
Patient Care Team: [name], MD as PCP - General [name], MD as Consulting Physician (General Surgery) Kyung Rudd, MD as Consulting Physician (Radiation Oncology) Sylvan Cheese, NP as Nurse Practitioner (Hematology and Oncology) Nicholas Lose, MD as Consulting Physician (Hematology and Oncology)  DIAGNOSIS:    ICD-10-CM   1. Malignant neoplasm of lower-outer quadrant of right breast of female, estrogen receptor positive ([place])  C50.511    Z17.0     SUMMARY OF ONCOLOGIC HISTORY: Oncology History Overview Note  Breast cancer of lower-outer quadrant of right female breast   Staging form: Breast, AJCC 7th Edition     Clinical: Stage IA (T1b, N0, M0) - Unsigned     Pathologic stage from 02/13/2015: Stage IB (T1b, N47m, cM0) - Unsigned      [place]ast [place] of lower-outer quadrant of right [place] breast [place]  01/05/2015 Mammogram   Right breast: possible mass warranting further evaluation   01/05/2015 Breast MRI   10 mm diameter enhancing mass in the lower outer right breast suspicious for malignancy   01/18/2015 Mammogram   Diagnostic mammo and ultrasound showed a 0.6 cm mass in the right breast 8:00 location 6 cm from the nipple, and a 0.5 cm lesion in the 10:00 location of the right breast 6 cm from the nipple. Right axilla was negative for adenopathy.   01/19/2015 Initial Biopsy   Right breast 8:00 mass biopsy showed invasive ductal carcinoma and DCIS, grade 1-2, the 10:00 mass biopsy showed fibrocystic change.ER 100% positive, PR 90% positive, HER-2 negative, Ki-67 10%   01/19/2015 Clinical Stage   Stage IA: T1b N0   02/13/2015 Definitive Surgery   Right breast lumpectomy: margins negative; Invasive ductal carcinoma, G1, 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 1 out of 2 lymph nodes were positive for micrometastasis.   02/13/2015 Oncotype testing   RS 12 (8% ROR)   02/13/2015 Pathologic Stage   Stage IB: T1b N1(mi)   03/10/2015 Procedure   Breast/Ovarian  Panel (Gene Dx): no clinically significant variant at ATM, BARD1, BRCA1, BRCA2, BRIP1, CDH1, CHEK2, FANCC, MLH1, MSH2, MSH6, NBN, PALB2, PMS2, PTEN, RAD51C, RAD51D, TP53, and XRCC2.    03/20/2015 - 04/14/2015 Radiation Therapy   Adjuvant XRT ([facility]: 42.5 Gy over 17 fractions to the breast using whole-breast tangent fields. Right breast boost 7.5 Gy over 3 fractions. Total dose: 50 Gy   04/19/2015 -  Anti-estrogen oral therapy   Anastrozole 1 mg daily. Planned duration of therapy 5-10 years.   07/05/2015 Survivorship   Survivorship care plan completed and mailed to patient in lieu of in person visit.   11/15/2015 Breast MRI   No evidence of breast malignancy. Anterior and lateral skin thickening consistent with radiation change     CHIEF COMPLIANT: Follow-up of right breast cancer on anastrozole therapy  INTERVAL HISTORY: Shannon TOMSONis a 69y.o. with above-mentioned history of right breast cancer treated with lumpectomy, radiation, and who is currently on anastrozole. Breast MRI on 03/03/19 showed no evidence of malignancy bilaterally. She presents to the clinic today for follow-up.  She is complaining of severe joint aches and pains which sound like fibromyalgia-like symptoms.  She cannot be certain that it is related to antiestrogen therapy.  She would like to see Dr. DEstanislado Pandywith rheumatology for this.  Her biggest other complaint is related to weight gain.  She thinks it is not her eating and that there is some other reason for her weight gain and she wants to be investigated.  We will try to obtain her an appointment with the weight loss clinic but apparently they have been very busy and she has not had an appointment yet.  She has seen her primary care physician and orthopedic specialist.  ALLERGIES:  is allergic to olive oil and oxycodone.  MEDICATIONS:  Current Outpatient Medications  Medication Sig Dispense Refill  . acetaminophen (TYLENOL) 325 MG tablet Take 650 mg by mouth  every 6 (six) hours as needed.    . ALPRAZolam (XANAX) 1 MG tablet TAKE 1 TABLET BY MOUTH 4 TIMES A DAY AS NEEDED FOR SLEEP OR ANXIETY 120 tablet 0  . amLODipine (NORVASC) 5 MG tablet Take 5 mg by mouth daily.    . Cholecalciferol 2000 units CHEW Chew 3 tablets by mouth daily. 30 tablet   . fexofenadine (ALLEGRA) 180 MG tablet Take 180 mg by mouth daily.    . Lactobacillus (PROBIOTIC ACIDOPHILUS PO) Take by mouth.    . letrozole (FEMARA) 2.5 MG tablet TAKE 1 TABLET BY MOUTH EVERY DAY 90 tablet 0  . losartan-hydrochlorothiazide (HYZAAR) 100-12.5 MG per tablet TAKE 1 TABLET BY MOUTH DAILY. 30 tablet 0  . LYRICA 100 MG capsule TAKE ONE CAPSULE TWICE A DAY 60 capsule 3  . meloxicam (MOBIC) 7.5 MG tablet Take 1 tablet (7.5 mg total) by mouth daily. Reported on 11/15/2015    . omeprazole (PRILOSEC) 20 MG capsule Take 20 mg by mouth daily.    . psyllium (METAMUCIL) 0.52 g capsule Take 1 capsule (0.52 g total) by mouth daily.    . tizanidine (ZANAFLEX) 2 MG capsule Take 2 mg by mouth at bedtime as needed for muscle spasms.    Marland Kitchen ZETIA 10 MG tablet TAKE 1 TABLET BY MOUTH DAILY. 90 tablet 1   No current facility-administered medications for this visit.    PHYSICAL EXAMINATION: ECOG PERFORMANCE STATUS: 1 - Symptomatic but completely ambulatory  Vitals:   07/12/19 1016  BP: 116/69  Pulse: 78  Resp: 18  Temp: 98.3 F (36.8 C)  SpO2: 100%   Filed Weights   07/12/19 1016  Weight: 176 lb 4.8 oz (80 kg)    BREAST: No palpable masses or nodules in either right or left breasts. No palpable axillary supraclavicular or infraclavicular adenopathy no breast tenderness or nipple discharge. (exam performed in the presence of a chaperone)  LABORATORY DATA:  I have reviewed the data as listed CMP Latest Ref Rng & Units 03/03/2019 11/28/2015 07/20/2015  Glucose 70 - 140 mg/dl - - 89  BUN 7.0 - 26.0 mg/dL - - 15.0  Creatinine 0.44 - 1.00 mg/dL 1.00 - 1.0  Sodium 136 - 145 mEq/L - - 139  Potassium 3.5 - 5.1  mEq/L - - 3.6  Chloride 101 - 111 mmol/L - - -  CO2 22 - 29 mEq/L - - 28  Calcium 8.4 - 10.4 mg/dL - - 9.8  Total Protein 6.0 - 8.5 g/dL - 7.4 7.7  Total Bilirubin 0.0 - 1.2 mg/dL - 0.5 0.60  Alkaline Phos 39 - 117 IU/L - 73 68  AST 0 - 40 IU/L - 31 24  ALT 0 - 32 IU/L - 21 19    Lab Results  Component Value Date   WBC 6.9 11/28/2015   HGB 11.7 11/28/2015   HCT 36.1 11/28/2015   MCV 81 11/28/2015   PLT 297 11/28/2015   NEUTROABS 4.5 11/28/2015    ASSESSMENT & PLAN:  Breast cancer of lower-outer quadrant of right female breast Right breast lumpectomy 02/13/2015:  margins negative; Invasive ductal carcinoma, G1, 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 1 out of 2 lymph nodes were positive for micrometastasis.Oncotype 12 (8% ROR) T1b N1 mic stage IB XRT from 03/20/15 to 04/14/2015  Current treatment: Anastrozole 1 mg daily started November 2016 switched to letrozole 03/01/2019 Letrozole toxicities: 1.  Diffuse arthralgias and myalgias: I discussed with her about different over-the-counter options including turmeric, CBD oils, magnesium, topical measures like Voltaren gel etc.  We will also refer her to see Shannon Hancock. 2.  Osteopenia: Bone density 04/23/2018: T score -1.5: Continue with calcium and vitamin D and weightbearing exercises 3.  Weight gain: We will try to get her an appointment to see [facility] weight loss specialist at [facility].  Breast cancer surveillance: 1.  Breast exam 07/12/2019: Benign 2.  Mammogram 02/23/2019: Benign breast density category C 3.  Breast MRI 03/04/2019: Postsurgical and radiation changes no evidence of malignancy, breast density category C    Return to clinic in 1 year for follow-up    No orders of the defined types were placed in this encounter.  The patient has a good understanding of the overall plan. she agrees with it. she will call with any problems that may develop before the next visit here.  Total time spent: 30 mins including face to  face time and time spent for planning, charting and coordination of care  Nicholas Lose, MD 07/12/2019  I, Cloyde Reams Dorshimer, am acting as scribe for Dr. Nicholas Lose.  I have reviewed the above documentation for accuracy and completeness, and I agree with the above.

## 2019-07-12 ENCOUNTER — Inpatient Hospital Stay: Payer: Medicare PPO | Attending: Hematology and Oncology | Admitting: Hematology and Oncology

## 2019-07-12 ENCOUNTER — Other Ambulatory Visit: Payer: Self-pay | Admitting: *Deleted

## 2019-07-12 ENCOUNTER — Telehealth: Payer: Self-pay | Admitting: Hematology and Oncology

## 2019-07-12 ENCOUNTER — Other Ambulatory Visit: Payer: Self-pay

## 2019-07-12 VITALS — BP 116/69 | HR 78 | Temp 98.3°F | Resp 18 | Ht 64.0 in | Wt 176.3 lb

## 2019-07-12 DIAGNOSIS — G8929 Other chronic pain: Secondary | ICD-10-CM

## 2019-07-12 DIAGNOSIS — M25551 Pain in right hip: Secondary | ICD-10-CM | POA: Diagnosis not present

## 2019-07-12 DIAGNOSIS — Z79899 Other long term (current) drug therapy: Secondary | ICD-10-CM | POA: Insufficient documentation

## 2019-07-12 DIAGNOSIS — Z79811 Long term (current) use of aromatase inhibitors: Secondary | ICD-10-CM | POA: Insufficient documentation

## 2019-07-12 DIAGNOSIS — M25561 Pain in right knee: Secondary | ICD-10-CM

## 2019-07-12 DIAGNOSIS — Z885 Allergy status to narcotic agent status: Secondary | ICD-10-CM | POA: Diagnosis not present

## 2019-07-12 DIAGNOSIS — M25562 Pain in left knee: Secondary | ICD-10-CM

## 2019-07-12 DIAGNOSIS — M791 Myalgia, unspecified site: Secondary | ICD-10-CM | POA: Insufficient documentation

## 2019-07-12 DIAGNOSIS — M25552 Pain in left hip: Secondary | ICD-10-CM

## 2019-07-12 DIAGNOSIS — I219 Acute myocardial infarction, unspecified: Secondary | ICD-10-CM | POA: Diagnosis not present

## 2019-07-12 DIAGNOSIS — M8580 Other specified disorders of bone density and structure, unspecified site: Secondary | ICD-10-CM | POA: Insufficient documentation

## 2019-07-12 DIAGNOSIS — Z170 Estrogen receptor positive status [ER+]: Secondary | ICD-10-CM | POA: Diagnosis not present

## 2019-07-12 DIAGNOSIS — R634 Abnormal weight loss: Secondary | ICD-10-CM | POA: Insufficient documentation

## 2019-07-12 DIAGNOSIS — C50511 Malignant neoplasm of lower-outer quadrant of right female breast: Secondary | ICD-10-CM | POA: Diagnosis not present

## 2019-07-12 DIAGNOSIS — M2550 Pain in unspecified joint: Secondary | ICD-10-CM | POA: Diagnosis not present

## 2019-07-12 NOTE — Assessment & Plan Note (Signed)
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 1 out of 2 lymph nodes were positive for micrometastasis.Oncotype 12 (8% ROR) T1b N1 mic stage IB XRT from 03/20/15 to 04/14/2015  Current treatment: Anastrozole 1 mg daily started November 2016 switched to letrozole 03/01/2019 Letrozole toxicities: 1.  Diffuse arthralgias and myalgias: 2.  Osteopenia: Bone density 04/23/2018: T score -1.5: Continue with calcium and vitamin D and weightbearing exercises  Breast cancer surveillance: 1.  Breast exam 07/12/2019: Benign 2.  Mammogram 02/23/2019: Benign breast density category C 3.  Breast MRI 03/04/2019: Postsurgical and radiation changes no evidence of malignancy, breast density category C  We can plan to obtain MRIs every couple of years. Return to clinic in 1 year for follow-up

## 2019-07-12 NOTE — Telephone Encounter (Signed)
Gave patient time and date

## 2019-07-15 ENCOUNTER — Ambulatory Visit: Payer: Medicare Other | Admitting: Hematology and Oncology

## 2019-07-18 ENCOUNTER — Other Ambulatory Visit: Payer: Self-pay

## 2019-07-18 ENCOUNTER — Ambulatory Visit
Admission: RE | Admit: 2019-07-18 | Discharge: 2019-07-18 | Disposition: A | Discharge status: Home / Self Care | Payer: Medicare PPO | Source: Ambulatory Visit | Attending: Sports Medicine | Admitting: Sports Medicine

## 2019-07-18 DIAGNOSIS — M25561 Pain in right knee: Secondary | ICD-10-CM

## 2019-07-23 ENCOUNTER — Ambulatory Visit: Payer: Medicare PPO | Attending: Internal Medicine

## 2019-07-23 DIAGNOSIS — Z23 Encounter for immunization: Secondary | ICD-10-CM

## 2019-07-26 NOTE — Progress Notes (Signed)
   Covid-19 Vaccination Clinic  Name:  SUNDEE ALKIRE    MRN: [id number] DOB: 25-Nov-1950  07/23/2019  Ms. Chambless was observed post Covid-19 immunization for 15 minutes without incidence. She was provided with Vaccine Information Sheet and instruction to access the V-Safe system.   Ms. Ao was instructed to call 911 with any severe reactions post vaccine: Marland Kitchen Difficulty breathing  . Swelling of your face and throat  . A fast heartbeat  . A bad rash all over your body  . Dizziness and weakness    Immunizations Administered    Name Date Dose VIS Date Route   Moderna COVID-19 Vaccine 07/23/2019  9:51 AM 0.5 mL 06/01/2019 Intramuscular   Manufacturer: Moderna   Lot: [id number]   [id number]

## 2019-07-27 ENCOUNTER — Encounter (INDEPENDENT_AMBULATORY_CARE_PROVIDER_SITE_OTHER): Payer: Self-pay | Admitting: Family Medicine

## 2019-07-27 ENCOUNTER — Other Ambulatory Visit: Payer: Self-pay

## 2019-07-27 ENCOUNTER — Ambulatory Visit (INDEPENDENT_AMBULATORY_CARE_PROVIDER_SITE_OTHER): Payer: Medicare Other | Admitting: Family Medicine

## 2019-07-27 VITALS — BP 95/62 | HR 77 | Temp 98.2°F | Ht 63.0 in | Wt 173.0 lb

## 2019-07-27 DIAGNOSIS — R5383 Other fatigue: Secondary | ICD-10-CM

## 2019-07-27 DIAGNOSIS — D508 Other iron deficiency anemias: Secondary | ICD-10-CM

## 2019-07-27 DIAGNOSIS — R7303 Prediabetes: Secondary | ICD-10-CM | POA: Diagnosis not present

## 2019-07-27 DIAGNOSIS — M1990 Unspecified osteoarthritis, unspecified site: Secondary | ICD-10-CM

## 2019-07-27 DIAGNOSIS — R0602 Shortness of breath: Secondary | ICD-10-CM

## 2019-07-27 DIAGNOSIS — E7849 Other hyperlipidemia: Secondary | ICD-10-CM

## 2019-07-27 DIAGNOSIS — K219 Gastro-esophageal reflux disease without esophagitis: Secondary | ICD-10-CM

## 2019-07-27 DIAGNOSIS — C50919 Malignant neoplasm of unspecified site of unspecified female breast: Secondary | ICD-10-CM

## 2019-07-27 DIAGNOSIS — E559 Vitamin D deficiency, unspecified: Secondary | ICD-10-CM

## 2019-07-27 DIAGNOSIS — Z0289 Encounter for other administrative examinations: Secondary | ICD-10-CM

## 2019-07-27 DIAGNOSIS — G4733 Obstructive sleep apnea (adult) (pediatric): Secondary | ICD-10-CM

## 2019-07-27 DIAGNOSIS — I10 Essential (primary) hypertension: Secondary | ICD-10-CM | POA: Diagnosis not present

## 2019-07-27 DIAGNOSIS — Z6830 Body mass index (BMI) 30.0-30.9, adult: Secondary | ICD-10-CM

## 2019-07-27 DIAGNOSIS — M797 Fibromyalgia: Secondary | ICD-10-CM

## 2019-07-27 DIAGNOSIS — F418 Other specified anxiety disorders: Secondary | ICD-10-CM

## 2019-07-27 DIAGNOSIS — E669 Obesity, unspecified: Secondary | ICD-10-CM

## 2019-07-28 LAB — TSH: TSH: 1.62 u[IU]/mL (ref 0.450–4.500)

## 2019-07-28 LAB — ANEMIA PANEL
Ferritin: 239 ng/mL — ABNORMAL HIGH (ref 15–150)
Folate, Hemolysate: 347 ng/mL
Folate, RBC: 946 ng/mL (ref >498)
Hematocrit: 36.7 % (ref 34.0–46.6)
Iron Saturation: 17 % (ref 15–55)
Iron: 51 ug/dL (ref 27–139)
Retic Ct Pct: 1.4 % (ref 0.6–2.6)
Total Iron Binding Capacity: 302 ug/dL (ref 250–450)
UIBC: 251 ug/dL (ref 118–369)
Vitamin B-12: 1121 pg/mL (ref 232–1245)

## 2019-07-28 LAB — COMPREHENSIVE METABOLIC PANEL
ALT: 24 IU/L (ref 0–32)
AST: 23 IU/L (ref 0–40)
Albumin/Globulin Ratio: 2.5 — ABNORMAL HIGH (ref 1.2–2.2)
Albumin: 4.9 g/dL — ABNORMAL HIGH (ref 3.8–4.8)
Alkaline Phosphatase: 83 IU/L (ref 39–117)
BUN/Creatinine Ratio: 19 (ref 12–28)
BUN: 18 mg/dL (ref 8–27)
Bilirubin Total: 0.2 mg/dL (ref 0.0–1.2)
CO2: 24 mmol/L (ref 20–29)
Calcium: 10.1 mg/dL (ref 8.7–10.3)
Chloride: 101 mmol/L (ref 96–106)
Creatinine, Ser: 0.97 mg/dL (ref 0.57–1.00)
GFR calc Af Amer: 69 mL/min/{1.73_m2} (ref >59)
GFR calc non Af Amer: 60 mL/min/{1.73_m2} (ref >59)
Globulin, Total: 2 g/dL (ref 1.5–4.5)
Glucose: 79 mg/dL (ref 65–99)
Potassium: 4.4 mmol/L (ref 3.5–5.2)
Sodium: 141 mmol/L (ref 134–144)
Total Protein: 6.9 g/dL (ref 6.0–8.5)

## 2019-07-28 LAB — CBC WITH DIFFERENTIAL/PLATELET
Basophils Absolute: 0.1 10*3/uL (ref 0.0–0.2)
Basos: 1 %
EOS (ABSOLUTE): 0.1 10*3/uL (ref 0.0–0.4)
Eos: 2 %
Hemoglobin: 11.6 g/dL (ref 11.1–15.9)
Immature Grans (Abs): 0 10*3/uL (ref 0.0–0.1)
Immature Granulocytes: 0 %
Lymphocytes Absolute: 2.2 10*3/uL (ref 0.7–3.1)
Lymphs: 34 %
MCH: 26.1 pg — ABNORMAL LOW (ref 26.6–33.0)
MCHC: 31.6 g/dL (ref 31.5–35.7)
MCV: 83 fL (ref 79–97)
Monocytes Absolute: 0.6 10*3/uL (ref 0.1–0.9)
Monocytes: 9 %
Neutrophils Absolute: 3.4 10*3/uL (ref 1.4–7.0)
Neutrophils: 54 %
Platelets: 208 10*3/uL (ref 150–450)
RBC: 4.44 x10E6/uL (ref 3.77–5.28)
RDW: 14 % (ref 11.7–15.4)
WBC: 6.4 10*3/uL (ref 3.4–10.8)

## 2019-07-28 LAB — T3: T3, Total: 134 ng/dL (ref 71–180)

## 2019-07-28 LAB — LIPID PANEL WITH LDL/HDL RATIO
Cholesterol, Total: 190 mg/dL (ref 100–199)
HDL: 47 mg/dL (ref >39)
LDL Chol Calc (NIH): 124 mg/dL — ABNORMAL HIGH (ref 0–99)
LDL/HDL Ratio: 2.6 ratio (ref 0.0–3.2)
Triglycerides: 103 mg/dL (ref 0–149)
VLDL Cholesterol Cal: 19 mg/dL (ref 5–40)

## 2019-07-28 LAB — HEMOGLOBIN A1C
Est. average glucose Bld gHb Est-mCnc: 137 mg/dL
Hgb A1c MFr Bld: 6.4 % — ABNORMAL HIGH (ref 4.8–5.6)

## 2019-07-28 LAB — T4, FREE: Free T4: 0.96 ng/dL (ref 0.82–1.77)

## 2019-07-28 LAB — VITAMIN D 25 HYDROXY (VIT D DEFICIENCY, FRACTURES): Vit D, 25-Hydroxy: 45.7 ng/mL (ref 30.0–100.0)

## 2019-07-28 LAB — INSULIN, RANDOM: INSULIN: 32.8 u[IU]/mL — ABNORMAL HIGH (ref 2.6–24.9)

## 2019-07-28 NOTE — Progress Notes (Signed)
Chief Complaint:   OBESITY Shannon Hancock (MR# [id number]) is a 69 y.o. female who presents for evaluation and treatment of obesity and related comorbidities. Current BMI is Body mass index is 30.65 kg/m. Shannon Hancock has been struggling with her weight for many years and has been unsuccessful in either losing weight, maintaining weight loss, or reaching her healthy weight goal.  Shannon Hancock is currently in the action stage of change and ready to dedicate time achieving and maintaining a healthier weight. Shannon Hancock is interested in becoming our patient and working on intensive lifestyle modifications including (but not limited to) diet and exercise for weight loss.  Shannon Hancock's habits were reviewed today and are as follows: Her family sometimes eats meals together, she thinks her family will eat healthier with her, her desired weight loss is 52 pounds, she started gaining weight after she was diagnosed with breast cancer, her heaviest weight ever was her current weight of 172 pounds, she is a picky eater, she craves fruit, turnip greens, collards, chicken, and seafood, she skips breakfast daily and sometimes skips lunch, she is frequently drinking liquids with calories and she frequently makes poor food choices.  Depression Screen Shannon Hancock's Food and Mood (modified PHQ-9) score was 10.  Depression screen Shannon Hancock 2/9 07/27/2019  Decreased Interest 3  Down, Depressed, Hopeless 3  PHQ - 2 Score 6  Altered sleeping 0  Tired, decreased energy 3  Change in appetite 1  Feeling bad or failure about yourself  0  Trouble concentrating 0  Moving slowly or fidgety/restless 0  Suicidal thoughts 0  PHQ-9 Score 10  Difficult doing work/chores Not difficult at all  Some recent data might be hidden   Subjective:   1. Other fatigue Shannon Hancock admits to daytime somnolence and reports waking up still tired. Patent has a history of symptoms of daytime fatigue, morning fatigue and morning headache. Shannon Hancock generally gets 7  or 8 hours of sleep per night, and states that she has poor sleep quality. Snoring is present. Apneic episodes are not present. Epworth Sleepiness Score is 17.  2. Shortness of breath on exertion Shannon Hancock notes increasing shortness of breath with exercising and seems to be worsening over time with weight gain. She notes getting out of breath sooner with activity than she used to. This has gotten worse recently. Shannon Hancock denies shortness of breath at rest or orthopnea.  3. Prediabetes Shannon Hancock has a diagnosis of prediabetes based on her elevated HgA1c and was informed this puts her at greater risk of developing diabetes. She continues to work on diet and exercise to decrease her risk of diabetes. She denies nausea or hypoglycemia.  Lab Results  Component Value Date   INSULIN 32.8 (H) 07/27/2019   4. Essential hypertension Review: taking medications as instructed, no medication side effects noted.  She is taking amlodipine and losartan-HCTZ for blood pressure control.  BP Readings from Last 3 Encounters:  07/27/19 95/62  07/12/19 116/69  03/01/19 120/63   5. Other hyperlipidemia Shannon Hancock has hyperlipidemia and has been trying to improve her cholesterol levels with intensive lifestyle modification including a low saturated fat diet, exercise and weight loss. She denies any chest pain, claudication or myalgias.  She is taking Zetia 10 mg daily.  Lab Results  Component Value Date   ALT 24 07/27/2019   AST 23 07/27/2019   ALKPHOS 83 07/27/2019   BILITOT 0.2 07/27/2019   Lab Results  Component Value Date   CHOL 190 07/27/2019   HDL 47 07/27/2019  LDLCALC 124 (H) 07/27/2019   LDLDIRECT 114.5 03/16/2013   TRIG 103 07/27/2019   CHOLHDL 5 03/16/2013   6. Gastroesophageal reflux disease, unspecified whether esophagitis present She has been experiencing acid reflux and takes omeprazole.  7. Fibromyalgia Zyleigh takes Lyrica 100 mg twice daily and tizanidine 2 mg prn muscle spasms for her  fibromyalgia.  8. OSA (obstructive sleep apnea) Grant has symptoms of OSA including waking up still tired, snoring, and having morning headaches.  9. Vitamin D deficiency Abeni is currently taking vit D. She denies nausea, vomiting or muscle weakness.  10. Other iron deficiency anemia Cannie is not a vegetarian.  She does not have a history of weight loss surgery.   CBC Latest Ref Rng & Units 07/27/2019 11/28/2015 07/20/2015  WBC 3.4 - 10.8 x10E3/uL 6.4 6.9 8.7  Hemoglobin 11.1 - 15.9 g/dL 11.6 11.7 11.9  Hematocrit 34.0 - 46.6 % 36.7 36.1 36.9  Platelets 150 - 450 x10E3/uL 208 297 220   Lab Results  Component Value Date   IRON 51 07/27/2019   TIBC 302 07/27/2019   FERRITIN 239 (H) 07/27/2019   Lab Results  Component Value Date   VITAMINB12 1,121 07/27/2019   11. Malignant neoplasm of female breast, unspecified estrogen receptor status, unspecified laterality, unspecified site of breast ([place]) Shannon Hancock is currently taking letrozole 2.5 mg daily.  12. Arthritis Shannon Hancock has arthritis of the right ankle, which inhibits exercise.  82. Depression with anxiety Armelle suffers from depression and anxiety.  She is taking Xanax 1 mg four times daily as needed.  Her PHQ-2 today is 10.    Assessment/Plan:   1. Other fatigue Jazzlene does feel that her weight is causing her energy to be lower than it should be. Fatigue may be related to obesity, depression or many other causes. Labs will be ordered, and in the meanwhile, Yareli will focus on self care including making healthy food choices, increasing physical activity and focusing on stress reduction.  Orders - EKG 12-Lead - T3 - T4, free - TSH  2. Shortness of breath on exertion Aditri does feel that she gets out of breath more easily that she used to when she exercises. Nyajah's shortness of breath appears to be obesity related and exercise induced. She has agreed to work on weight loss and gradually increase exercise to treat her  exercise induced shortness of breath. Will continue to monitor closely.  Indirect calorimetry today.  3. Prediabetes Malise will continue to work on weight loss, exercise, and decreasing simple carbohydrates to help decrease the risk of diabetes.   Orders - Comprehensive metabolic panel - Hemoglobin A1c - Insulin, random  4. Essential hypertension Melika is working on healthy weight loss and exercise to improve blood pressure control. We will watch for signs of hypotension as she continues her lifestyle modifications.  5. Other hyperlipidemia Cardiovascular risk and specific lipid/LDL goals reviewed.  We discussed several lifestyle modifications today and Licet will continue to work on diet, exercise and weight loss efforts. Orders and follow up as documented in patient record.   Counseling Intensive lifestyle modifications are the first line treatment for this issue. . Dietary changes: Increase soluble fiber. Decrease simple carbohydrates. . Exercise changes: Moderate to vigorous-intensity aerobic activity 150 minutes per week if tolerated. . Lipid-lowering medications: see documented in medical record.  Orders - Lipid Panel With LDL/HDL Ratio  6. Gastroesophageal reflux disease, unspecified whether esophagitis present Intensive lifestyle modifications are the first line treatment for this issue. We discussed several lifestyle  modifications today and she will continue to work on diet, exercise and weight loss efforts. Orders and follow up as documented in patient record.   Counseling . If a person has gastroesophageal reflux disease (GERD), food and stomach acid move back up into the esophagus and cause symptoms or problems such as damage to the esophagus. . Anti-reflux measures include: raising the head of the bed, avoiding tight clothing or belts, avoiding eating late at night, not lying down shortly after mealtime, and achieving weight loss. . Avoid ASA, NSAID's, caffeine,  alcohol, and tobacco.  . OTC Pepcid and/or Tums are often very helpful for as needed use.  Marland Kitchen However, for persisting chronic or daily symptoms, stronger medications like Omeprazole may be needed. . You may need to avoid foods and drinks such as: ? Coffee and tea (with or without caffeine). ? Drinks that contain alcohol. ? Energy drinks and sports drinks. ? Bubbly (carbonated) drinks or sodas. ? Chocolate and cocoa. ? Peppermint and mint flavorings. ? Garlic and onions. ? Horseradish. ? Spicy and acidic foods. These include peppers, chili powder, curry powder, vinegar, hot sauces, and BBQ sauce. ? Citrus fruit juices and citrus fruits, such as oranges, lemons, and limes. ? Tomato-based foods. These include red sauce, chili, salsa, and pizza with red sauce. ? Fried and fatty foods. These include donuts, french fries, potato chips, and high-fat dressings. ? High-fat meats. These include hot dogs, rib eye steak, sausage, ham, and bacon.  7. Fibromyalgia Intensive lifestyle modifications are the first line treatment for this issue. We discussed several lifestyle modifications today and she will continue to work on diet, exercise and weight loss efforts.We will continue to monitor. Orders and follow up as documented in patient record.   Counseling . Try https://www.taylor-robbins.com/, which is a series of self-care modules designed to teach patients several techniques to manage pain.   8. OSA (obstructive sleep apnea) Intensive lifestyle modifications are the first line treatment for this issue. We discussed several lifestyle modifications today and she will continue to work on diet, exercise and weight loss efforts. We will continue to monitor. Orders and follow up as documented in patient record.   Counseling  Sleep apnea is a condition in which breathing pauses or becomes shallow during sleep. This happens over and over during the night. This disrupts your sleep and keeps your body from  getting the rest that it needs, which can cause tiredness and lack of energy (fatigue) during the day.  Sleep apnea treatment: If you were given a device to open your airway while you sleep, USE IT!  Sleep hygiene:   Limit or avoid alcohol, caffeinated beverages, and cigarettes, especially close to bedtime.   Do not eat a large meal or eat spicy foods right before bedtime. This can lead to digestive discomfort that can make it hard for you to sleep.  Keep a sleep diary to help you and your health care provider figure out what could be causing your insomnia.  . Make your bedroom a dark, comfortable place where it is easy to fall asleep. ? Put up shades or blackout curtains to block light from outside. ? Use a white noise machine to block noise. ? Keep the temperature cool. . Limit screen use before bedtime. This includes: ? Watching TV. ? Using your smartphone, tablet, or computer. . Stick to a routine that includes going to bed and waking up at the same times every day and night. This can help you fall asleep faster. Consider  making a quiet activity, such as reading, part of your nighttime routine. . Try to avoid taking naps during the day so that you sleep better at night. . Get out of bed if you are still awake after 15 minutes of trying to sleep. Keep the lights down, but try reading or doing a quiet activity. When you feel sleepy, go back to bed.  9. Vitamin D deficiency Low Vitamin D level contributes to fatigue and are associated with obesity, breast, and colon cancer.  Orders - VITAMIN D 25 Hydroxy (Vit-D Deficiency, Fractures)  10. Other iron deficiency anemia Orders and follow up as documented in patient record.  Counseling . Iron is essential for our bodies to make red blood cells.  Reasons that someone may be deficient include: an iron-deficient diet (more likely in those following vegan or vegetarian diets), women with heavy menses, patients with GI disorders or poor  absorption, patients that have had bariatric surgery, frequent blood donors, patients with cancer, and patients with heart disease.   Shannon Hancock foods include dark leafy greens, red and white meats, eggs, seafood, and beans.   . Certain foods and drinks prevent your body from absorbing iron properly. Avoid eating these foods in the same meal as iron-rich foods or with iron supplements. These foods include: coffee, black tea, and red wine; milk, dairy products, and foods that are high in calcium; beans and soybeans; whole grains.  . Constipation can be a side effect of iron supplementation. Increased water and fiber intake are helpful. Water goal: > 2 liters/day. Fiber goal: > 25 grams/day.  Orders - Anemia panel - CBC with Differential/Platelet  11. Malignant neoplasm of female breast, unspecified estrogen receptor status, unspecified laterality, unspecified site of breast ([place]) Will monitor.  12. Arthritis Will follow.  13. Depression with anxiety Refer to Dr. Sharyon Medicus, psychiatry.   14. Class 1 obesity with serious comorbidity and body mass index (BMI) of 30.0 to 30.9 in adult, unspecified obesity type Shannon Hancock is currently in the action stage of change and her goal is to continue with weight loss efforts. Shannon Hancock recommend Shannon Hancock begin the structured treatment plan as follows:  She has agreed to the Category 1 Plan.  Exercise goals: Older adults should follow the adult guidelines. When older adults cannot meet the adult guidelines, they should be as physically active as their abilities and conditions will allow.  Older adults should do exercises that maintain or improve balance if they are at risk of falling.    Behavioral modification strategies: increasing lean protein intake, decreasing simple carbohydrates, increasing vegetables and increasing water intake.  She was informed of the importance of frequent follow-up visits to maximize her success with intensive lifestyle modifications for her  multiple health conditions. She was informed we would discuss her lab results at her next visit unless there is a critical issue that needs to be addressed sooner. Shannon Hancock agreed to keep her next visit at the agreed upon time to discuss these results.  Objective:   Blood pressure 95/62, pulse 77, temperature 98.2 F (36.8 C), temperature source Oral, height 5\' 3"  (1.6 m), weight 173 lb (78.5 kg), SpO2 98 %. Body mass index is 30.65 kg/m.  EKG: Normal sinus rhythm, rate 69 bpm.  Indirect Calorimeter completed today shows a VO2 of 194 and a REE of 1353.  Her calculated basal metabolic rate is 99991111 thus her basal metabolic rate is worse than expected.  General: Cooperative, alert, well developed, in no acute distress. HEENT: Conjunctivae and  lids unremarkable. Cardiovascular: Regular rhythm.  Lungs: Normal work of breathing. Neurologic: No focal deficits.   Lab Results  Component Value Date   CREATININE 0.97 07/27/2019   BUN 18 07/27/2019   NA 141 07/27/2019   K 4.4 07/27/2019   CL 101 07/27/2019   CO2 24 07/27/2019   Lab Results  Component Value Date   ALT 24 07/27/2019   AST 23 07/27/2019   ALKPHOS 83 07/27/2019   BILITOT 0.2 07/27/2019   Lab Results  Component Value Date   HGBA1C WILL FOLLOW 07/27/2019   Lab Results  Component Value Date   INSULIN 32.8 (H) 07/27/2019   Lab Results  Component Value Date   TSH 1.620 07/27/2019   Lab Results  Component Value Date   CHOL 190 07/27/2019   HDL 47 07/27/2019   LDLCALC 124 (H) 07/27/2019   LDLDIRECT 114.5 03/16/2013   TRIG 103 07/27/2019   CHOLHDL 5 03/16/2013   Lab Results  Component Value Date   WBC 6.4 07/27/2019   HGB 11.6 07/27/2019   HCT 36.7 07/27/2019   MCV 83 07/27/2019   PLT 208 07/27/2019   Lab Results  Component Value Date   IRON 51 07/27/2019   TIBC 302 07/27/2019   FERRITIN 239 (H) 07/27/2019   Attestation Statements:   This is the patient's first visit at [facility]. The  patient's NEW PATIENT PACKET was reviewed at length. Included in the packet: current and past health history, medications, allergies, ROS, gynecologic history (women only), surgical history, family history, social history, weight history, weight loss surgery history (for those that have had weight loss surgery), nutritional evaluation, mood and food questionnaire, PHQ9, Epworth questionnaire, sleep habits questionnaire, patient life and health improvement goals questionnaire. These will all be scanned into the patient's chart under media.   During the visit, Shannon Hancock independently reviewed the patient's EKG, bioimpedance scale results, and indirect calorimeter results. Shannon Hancock used this information to tailor a meal plan for the patient that will help her to lose weight and will improve her obesity-related conditions going forward. Shannon Hancock performed a medically necessary appropriate examination and/or evaluation. Shannon Hancock discussed the assessment and treatment plan with the patient. The patient was provided an opportunity to ask questions and all were answered. The patient agreed with the plan and demonstrated an understanding of the instructions. Labs were ordered at this visit and will be reviewed at the next visit unless more critical results need to be addressed immediately. Clinical information was updated and documented in the EMR.   Time spent on visit including pre-visit chart review and post-visit care was 65 minutes.   Shannon Hancock, Water quality scientist, CMA, am acting as Location manager for [employer], DO.  Shannon Hancock have reviewed the above documentation for accuracy and completeness, and Shannon Hancock agree with the above. Briscoe Deutscher, DO

## 2019-08-10 ENCOUNTER — Ambulatory Visit (INDEPENDENT_AMBULATORY_CARE_PROVIDER_SITE_OTHER): Payer: Medicare PPO | Admitting: Family Medicine

## 2019-08-10 ENCOUNTER — Encounter (INDEPENDENT_AMBULATORY_CARE_PROVIDER_SITE_OTHER): Payer: Self-pay | Admitting: Family Medicine

## 2019-08-10 ENCOUNTER — Other Ambulatory Visit: Payer: Self-pay

## 2019-08-10 VITALS — BP 112/53 | HR 76 | Temp 98.6°F | Ht 63.0 in | Wt 171.0 lb

## 2019-08-10 DIAGNOSIS — E66811 Obesity, class 1: Secondary | ICD-10-CM

## 2019-08-10 DIAGNOSIS — Z6830 Body mass index (BMI) 30.0-30.9, adult: Secondary | ICD-10-CM | POA: Diagnosis not present

## 2019-08-10 DIAGNOSIS — M797 Fibromyalgia: Secondary | ICD-10-CM | POA: Diagnosis not present

## 2019-08-10 DIAGNOSIS — E7849 Other hyperlipidemia: Secondary | ICD-10-CM

## 2019-08-10 DIAGNOSIS — R7303 Prediabetes: Secondary | ICD-10-CM | POA: Diagnosis not present

## 2019-08-10 DIAGNOSIS — E669 Obesity, unspecified: Secondary | ICD-10-CM | POA: Diagnosis not present

## 2019-08-10 NOTE — Progress Notes (Signed)
Chief Complaint:   OBESITY Vondra is here to discuss her progress with her obesity treatment plan along with follow-up of her obesity related diagnoses. Halina is on the Category 1 Plan and states she is following her eating plan approximately 50% of the time. Ajah states she is exercising for 0 minutes 0 times per week.  Today's visit was #: 2 Starting weight: 173 lbs Starting date: 07/27/2019 Today's weight: 171 lbs Today's date: 08/10/2019 Total lbs lost to date: 2 lbs Total lbs lost since last in-office visit: 2 lbs  Interim History: Zainub has been doing 16:8 intermittent fasting and staying under 1500 calories per day.  Subjective:   1. Prediabetes Cadince has a diagnosis of prediabetes based on her elevated HgA1c and was informed this puts her at greater risk of developing diabetes. She continues to work on diet and exercise to decrease her risk of diabetes. She denies nausea or hypoglycemia.  Lab Results  Component Value Date   HGBA1C 6.4 (H) 07/27/2019   Lab Results  Component Value Date   INSULIN 32.8 (H) 07/27/2019   2. Other hyperlipidemia Katenia has hyperlipidemia and has been trying to improve her cholesterol levels with intensive lifestyle modification including a low saturated fat diet, exercise and weight loss. She denies any chest pain, claudication or myalgias.  She is taking Zetia.  Lab Results  Component Value Date   ALT 24 07/27/2019   AST 23 07/27/2019   ALKPHOS 83 07/27/2019   BILITOT 0.2 07/27/2019   Lab Results  Component Value Date   CHOL 190 07/27/2019   HDL 47 07/27/2019   LDLCALC 124 (H) 07/27/2019   LDLDIRECT 114.5 03/16/2013   TRIG 103 07/27/2019   CHOLHDL 5 03/16/2013   3. Fibromyalgia Denzil is taking Lyria and tizanidine for fibromyalgia.  Assessment/Plan:   1. Prediabetes Reni will continue to work on weight loss, exercise, and decreasing simple carbohydrates to help decrease the risk of diabetes.   2. Other  hyperlipidemia Cardiovascular risk and specific lipid/LDL goals reviewed.  We discussed several lifestyle modifications today and Shaelene will continue to work on diet, exercise and weight loss efforts. Orders and follow up as documented in patient record.   Counseling Intensive lifestyle modifications are the first line treatment for this issue. . Dietary changes: Increase soluble fiber. Decrease simple carbohydrates. . Exercise changes: Moderate to vigorous-intensity aerobic activity 150 minutes per week if tolerated. . Lipid-lowering medications: see documented in medical record.  3. Fibromyalgia Intensive lifestyle modifications are the first line treatment for this issue. We discussed several lifestyle modifications today and she will continue to work on diet, exercise and weight loss efforts.We will continue to monitor. Orders and follow up as documented in patient record.   Counseling . Try https://www.taylor-robbins.com/, which is a series of self-care modules designed to teach patients several techniques to manage pain.   4. Class 1 obesity with serious comorbidity and body mass index (BMI) of 30.0 to 30.9 in adult, unspecified obesity type Inamae is currently in the action stage of change. As such, her goal is to continue with weight loss efforts. She has agreed to the Category 1 Plan.  She prefers intermittent fasting, low carb.  Exercise goals: Older adults should follow the adult guidelines. When older adults cannot meet the adult guidelines, they should be as physically active as their abilities and conditions will allow.  Older adults should do exercises that maintain or improve balance if they are at risk of falling.  Behavioral modification strategies: increasing lean protein intake and meal planning and cooking strategies.  Valois has agreed to follow-up with our clinic in 2 weeks. She was informed of the importance of frequent follow-up visits to maximize her success with  intensive lifestyle modifications for her multiple health conditions.   Objective:   Blood pressure (!) 112/53, pulse 76, temperature 98.6 F (37 C), temperature source Oral, height 5\' 3"  (1.6 m), weight 171 lb (77.6 kg), SpO2 100 %. Body mass index is 30.29 kg/m.  General: Cooperative, alert, well developed, in no acute distress. HEENT: Conjunctivae and lids unremarkable. Cardiovascular: Regular rhythm.  Lungs: Normal work of breathing. Neurologic: No focal deficits.   Lab Results  Component Value Date   CREATININE 0.97 07/27/2019   BUN 18 07/27/2019   NA 141 07/27/2019   K 4.4 07/27/2019   CL 101 07/27/2019   CO2 24 07/27/2019   Lab Results  Component Value Date   ALT 24 07/27/2019   AST 23 07/27/2019   ALKPHOS 83 07/27/2019   BILITOT 0.2 07/27/2019   Lab Results  Component Value Date   HGBA1C 6.4 (H) 07/27/2019   Lab Results  Component Value Date   INSULIN 32.8 (H) 07/27/2019   Lab Results  Component Value Date   TSH 1.620 07/27/2019   Lab Results  Component Value Date   CHOL 190 07/27/2019   HDL 47 07/27/2019   LDLCALC 124 (H) 07/27/2019   LDLDIRECT 114.5 03/16/2013   TRIG 103 07/27/2019   CHOLHDL 5 03/16/2013   Lab Results  Component Value Date   WBC 6.4 07/27/2019   HGB 11.6 07/27/2019   HCT 36.7 07/27/2019   MCV 83 07/27/2019   PLT 208 07/27/2019   Lab Results  Component Value Date   IRON 51 07/27/2019   TIBC 302 07/27/2019   FERRITIN 239 (H) 07/27/2019    Obesity Behavioral Intervention:   Approximately 15 minutes were spent on the discussion below.  ASK: We discussed the diagnosis of obesity with Horris Latino today and Jacquline agreed to give [place] permission to discuss obesity behavioral modification therapy today.  ASSESS: Jaylinne has the diagnosis of obesity and her BMI today is 30.4. Hasset is in the action stage of change.   ADVISE: Jammy was educated on the multiple health risks of obesity as well as the benefit of weight loss to  improve her health. She was advised of the need for long term treatment and the importance of lifestyle modifications to improve her current health and to decrease her risk of future health problems.  AGREE: Multiple dietary modification options and treatment options were discussed and Ronnae agreed to follow the recommendations documented in the above note.  ARRANGE: Torri was educated on the importance of frequent visits to treat obesity as outlined per CMS and USPSTF guidelines and agreed to schedule her next follow up appointment today.  Attestation Statements:   Reviewed by clinician on day of visit: allergies, medications, problem list, medical history, surgical history, family history, social history, and previous encounter notes.  I, Water quality scientist, CMA, am acting as Location manager for [employer], DO.  I have reviewed the above documentation for accuracy and completeness, and I agree with the above. Briscoe Deutscher, DO

## 2019-08-20 ENCOUNTER — Ambulatory Visit: Payer: Medicare PPO

## 2019-08-25 ENCOUNTER — Ambulatory Visit (INDEPENDENT_AMBULATORY_CARE_PROVIDER_SITE_OTHER): Payer: Medicare PPO | Admitting: Family Medicine

## 2019-08-27 ENCOUNTER — Ambulatory Visit: Payer: Medicare PPO | Attending: Internal Medicine

## 2019-08-27 DIAGNOSIS — Z23 Encounter for immunization: Secondary | ICD-10-CM

## 2019-08-27 NOTE — Progress Notes (Signed)
   Covid-19 Vaccination Clinic  Name:  Shannon Hancock    MRN: [id number] DOB: Sep 30, 1950  08/27/2019  [name] was observed post Covid-19 immunization for 15 minutes without incidence. She was provided with Vaccine Information Sheet and instruction to access the V-Safe system.   [name] was instructed to call 911 with any severe reactions post vaccine: Marland Kitchen Difficulty breathing  . Swelling of your face and throat  . A fast heartbeat  . A bad rash all over your body  . Dizziness and weakness    Immunizations Administered    Name Date Dose VIS Date Route   Moderna COVID-19 Vaccine 08/27/2019  8:58 AM 0.5 mL 06/01/2019 Intramuscular   Manufacturer: Moderna   Lot: [id number]   [place]:[id number]

## 2019-08-31 NOTE — Progress Notes (Signed)
Office Visit Note  Patient: Shannon Hancock             Date of Birth: 03-24-1951           MRN: [id number]             PCP: [name], MD Referring: [name], MD Visit Date: 09/06/2019 Occupation: _0 @  Subjective:  Pain in multiple joints.   History of Present Illness: Shannon Hancock is a 69 y.o. female seen in consultation per request of her PCP.  According to patient she was diagnosed with fibromyalgia syndrome several years ago.  Patient states about 3 years ago she started working on the concrete floors and started experiencing increased pain in her feet and swelling.  She also started experiencing increased pain in her knee joints, her hip joints and her right shoulder.  She also has lower back pain.  She has been under care of Dr. Delilah Shan at [facility] who has given her injections for plantar fasciitis and also knee joint osteoarthritis.  She is also had Visco supplement injections in her knees.  She has not seen any relief from the injections in her knee joints.  She denies any joint swelling.  She also states that she has increased muscle pain in her extremities.  She can differentiate the pain between the fibromyalgia and the joint pain.  She states recently she has been experiencing more joint pain.  There is no family history of autoimmune disease.  There is no history of rash.  She denies any history of oral ulcers, nasal ulcers, malar rash, photosensitivity, Raynaud's phenomenon.  Activities of Daily Living:  Patient reports morning stiffness for 24 hours.   Patient Reports nocturnal pain.  Difficulty dressing/grooming: Denies Difficulty climbing stairs: Reports Difficulty getting out of chair: Reports Difficulty using hands for taps, buttons, cutlery, and/or writing: Denies  Review of Systems  Constitutional: Positive for fatigue. Negative for night sweats, weight gain and weight loss.  HENT: Positive for mouth dryness. Negative for mouth sores,  trouble swallowing, trouble swallowing and nose dryness.   Eyes: Positive for dryness. Negative for pain, redness and visual disturbance.  Respiratory: Negative for cough, shortness of breath and difficulty breathing.   Cardiovascular: Negative for chest pain, palpitations, hypertension, irregular heartbeat and swelling in legs/feet.  Gastrointestinal: Negative for blood in stool, constipation and diarrhea.  Endocrine: Negative for increased urination.  Genitourinary: Negative for difficulty urinating, painful urination and vaginal dryness.  Musculoskeletal: Positive for arthralgias, joint pain, joint swelling, morning stiffness and muscle tenderness. Negative for myalgias, muscle weakness and myalgias.  Skin: Negative for color change, rash, hair loss, skin tightness, ulcers and sensitivity to sunlight.  Allergic/Immunologic: Negative for susceptible to infections.  Neurological: Negative for dizziness, headaches, memory loss, night sweats and weakness.  Hematological: Negative for bruising/bleeding tendency and swollen glands.  Psychiatric/Behavioral: Positive for depressed mood and sleep disturbance. Negative for confusion. The patient is nervous/anxious.     PMFS History:  Patient Active Problem List   Diagnosis Date Noted  . Thyroid cyst 02/26/2018  . Plantar fasciitis 02/19/2016  . Family history of breast cancer in sister 03/10/2015  . Breast cancer of lower-outer quadrant of right female breast ([place]) 02/10/2015  . Abdominal bloating 12/16/2013  . Edema 11/09/2013  . Urge incontinence 05/05/2013  . Atrophic vaginitis 05/05/2013  . Syncope 02/15/2013  . Irritable bowel syndrome 08/20/2012  . Hearing loss 11/07/2011  . Generalized anxiety disorder 11/07/2011  . Peripheral neuropathy 07/05/2011  . Osteoporosis 07/05/2011  .  Tinnitus, bilateral 04/05/2011  . HYPERCHOLESTEROLEMIA 04/05/2010  . ANEMIA, IRON DEFICIENCY 04/03/2010  . DEPRESSION, MAJOR, MODERATE 04/03/2010  .  Essential hypertension 04/03/2010  . ALLERGIC RHINITIS 04/03/2010  . 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 POSTMENOPAUSAL SYNDROME 04/03/2010  . 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 FIBROMYALGIA 04/03/2010    Past Medical History:  Diagnosis Date  . Allergy   . Anxiety   . 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 Depression   . Diverticulosis   . Fibromyalgia   . GERD (gastroesophageal reflux disease)   . Heart murmur   . High cholesterol   . Hypertension   . Joint pain   . Prediabetes     Family History  Problem Relation Age of Onset  . Alzheimer's disease Mother   . Stroke Mother   . Diabetes Mother   . Hypertension Mother   . Hyperlipidemia Mother   . Depression Mother   . Anxiety disorder Mother   . Coronary artery disease Father   . Hypertension Father   . Depression Father   . Prostate cancer Father 75  . Colon polyps Father        unspecified number  . Diabetes Father   . Hyperlipidemia Father   . Heart disease Father   . Kidney disease Father   . Breast cancer Sister 109  . Colon polyps Sister        3-4 total  . Urinary tract infection Sister   . Hypertension Brother   . Hyperlipidemia Brother   . Prostate cancer Brother 8  . Breast cancer Maternal Aunt        dx. 45s  . Kidney cancer Cousin        dx. 67s; smoker while in college  . Lung cancer Maternal Uncle        smoker  . Cancer Paternal Aunt        unspecified type  . Cirrhosis Paternal Uncle   . Colon cancer Maternal Grandmother 44  . Cancer Maternal Grandfather        unspecified type  . Parkinson's disease Maternal Grandfather   . Kidney cancer Paternal Grandfather        dx. 57s  . Healthy Daughter   . Breast cancer Cousin   . Lung cancer Other        MGM's sister  . Breast cancer Other   . Lung cancer Other   . Breast cancer Paternal Aunt 78  . Congestive Heart Failure Paternal Uncle   .  Diabetes Paternal Uncle   . Healthy Daughter    Past Surgical History:  Procedure Laterality Date  . BREAST LUMPECTOMY WITH RADIOACTIVE SEED AND SENTINEL LYMPH NODE BIOPSY Right 02/13/2015   Procedure: RIGHT BREAST LUMPECTOMY WITH RADIOACTIVE SEED AND SENTINEL LYMPH NODE MAPPING;  Surgeon: [name], MD;  Location: [facility];  Service: General;  Laterality: Right;  . FOOT SURGERY Left   . TUBAL LIGATION     Social History   Social History Narrative   Regular exercise-no   Living will: None (reviewed 2014)   Immunization History  Administered Date(s) Administered  . Influenza Split 07/05/2011  . Influenza Whole 04/03/2010, 04/04/2010  . Influenza,inj,Quad PF,6+ Mos 03/16/2013  . Moderna SARS-COVID-2 Vaccination 07/23/2019, 08/27/2019  . Td 07/01/2001     Objective: Vital Signs: BP 134/73 (BP Location: Left Arm, Patient Position: Sitting, Cuff Size: Normal)   Pulse 80  Resp 14   Ht 5' 3.5" (1.613 m)   Wt 180 lb (81.6 kg)   BMI 31.39 kg/m    Physical Exam Vitals and nursing note reviewed.  Constitutional:      Appearance: She is well-developed.  HENT:     Head: Normocephalic and atraumatic.  Eyes:     Conjunctiva/sclera: Conjunctivae normal.  Cardiovascular:     Rate and Rhythm: Normal rate and regular rhythm.     Heart sounds: Normal heart sounds.  Pulmonary:     Effort: Pulmonary effort is normal.     Breath sounds: Normal breath sounds.  Abdominal:     General: Bowel sounds are normal.     Palpations: Abdomen is soft.  Musculoskeletal:     Cervical back: Normal range of motion.  Lymphadenopathy:     Cervical: No cervical adenopathy.  Skin:    General: Skin is warm and dry.     Capillary Refill: Capillary refill takes less than 2 seconds.  Neurological:     Mental Status: She is alert and oriented to person, place, and time.  Psychiatric:        Behavior: Behavior normal.      Musculoskeletal Exam: Patient had discomfort range of  motion of her cervical spine.  She has discomfort range of motion of the lumbar spine.  She has painful range of motion of her right shoulder joint.  Other joints are in good range of motion without discomfort.  She has tenderness over bilateral wrist joints and MCPs.  No synovitis was noted.  She had painful range of motion of her right hip joint.  She has limited extension of her right knee joint with some warmth on palpation of her right knee joint.  She has no discomfort over ankle joints.  She has some tenderness over plantar fascia.  She has bilateral pes planus.  Some PIP and DIP tenderness was noted.  CDAI Exam: CDAI Score: -- Patient Global: --; Provider Global: -- Swollen: --; Tender: -- Joint Exam 09/06/2019   No joint exam has been documented for this visit   There is currently no information documented on the homunculus. Go to the Rheumatology activity and complete the homunculus joint exam.  Investigation: No additional findings.  Imaging: No results found.  Recent Labs: Lab Results  Component Value Date   WBC 6.4 07/27/2019   HGB 11.6 07/27/2019   PLT 208 07/27/2019   NA 141 07/27/2019   K 4.4 07/27/2019   CL 101 07/27/2019   CO2 24 07/27/2019   GLUCOSE 79 07/27/2019   BUN 18 07/27/2019   CREATININE 0.97 07/27/2019   BILITOT 0.2 07/27/2019   ALKPHOS 83 07/27/2019   AST 23 07/27/2019   ALT 24 07/27/2019   PROT 6.9 07/27/2019   ALBUMIN 4.9 (H) 07/27/2019   CALCIUM 10.1 07/27/2019   GFRAA 69 07/27/2019    Speciality Comments: No specialty comments available.  Procedures:  No procedures performed Allergies: Olive oil and Oxycodone   Assessment / Plan:     Visit Diagnoses: Chronic right shoulder pain -patient is pain and discomfort in her right shoulder joint for the last few years.  She states she has had cortisone injections in the past without much relief.  Plan: XR Shoulder Right.  Acromioclavicular arthritis was noted.  Pain in both hands -she  complains of pain and discomfort in her bilateral hands.  She had tenderness over MCPs and wrist joints.  No synovitis was noted.  Plan: XR Hand 2 View  Right, XR Hand 2 View Left, x-ray of bilateral hands are consistent with osteoarthritis.  Sedimentation rate, Rheumatoid factor, Cyclic citrul peptide antibody, IgG, 14-3-3 eta Protein, ANA, Angiotensin converting enzyme, Uric acid  Chronic arthralgias of knees and hips -she complains of pain and discomfort in the bilateral knee joints.  Her right knee joint has limited extension.  She has some discomfort in her right hip as well.  Plan: XR KNEE 3 VIEW RIGHT, XR Pelvis 1-2 Views, XR KNEE 3 VIEW LEFT severe osteoarthritis of the right knee joint and moderate osteoarthritis of the left knee joint was noted.  Bilateral moderate chondromalacia patella was noted.  Pain in both feet -she has pain in her bilateral feet.  No synovitis was noted.  Plan: XR Foot 2 Views Right, XR Foot 2 Views Left.  X-ray findings are consistent with osteoarthritis.  Plantar fasciitis -patient gives history of recurrent plantar fasciitis and had cortisone injections.  DDD (degenerative disc disease), cervical-he has chronic pain.  Fibromyalgia-patient states she was diagnosed with fibromyalgia several years ago and continues to have some generalized pain and discomfort.  Other fatigue - Plan: CK  Anxiety and depression-she is on medications.  History of peripheral neuropathy  Essential hypertension-blood pressure is fairly well controlled  Malignant neoplasm of lower-outer quadrant of right breast of female, estrogen receptor positive ([place])  History of IBS  History of gastroesophageal reflux (GERD)  History of diverticulosis  Chronic interstitial cystitis with hematuria  Atrophic vaginitis  Thyroid cyst  Prediabetes  Age-related osteoporosis without current pathological fracture-according to patient the bone density was borderline and she does not require  treatment.  Orders: Orders Placed This Encounter  Procedures  . XR Hand 2 View Right  . XR Hand 2 View Left  . XR KNEE 3 VIEW RIGHT  . XR Pelvis 1-2 Views  . XR KNEE 3 VIEW LEFT  . XR Foot 2 Views Right  . XR Foot 2 Views Left  . XR Shoulder Right  . Sedimentation rate  . CK  . Rheumatoid factor  . Cyclic citrul peptide antibody, IgG  . 14-3-3 eta Protein  . ANA  . Angiotensin converting enzyme  . Uric acid   No orders of the defined types were placed in this encounter.   Face-to-face time spent with patient was 60 minutes. Greater than 50% of time was spent in counseling and coordination of care.  Follow-Up Instructions: Return for Polyarthralgia.   Bo Merino, MD  Note - This record has been created using Editor, commissioning.  Chart creation errors have been sought, but may not always  have been located. Such creation errors do not reflect on  the standard of medical care.

## 2019-09-01 ENCOUNTER — Ambulatory Visit (INDEPENDENT_AMBULATORY_CARE_PROVIDER_SITE_OTHER): Payer: Medicare PPO | Admitting: Family Medicine

## 2019-09-06 ENCOUNTER — Ambulatory Visit: Payer: Self-pay

## 2019-09-06 ENCOUNTER — Other Ambulatory Visit: Payer: Self-pay

## 2019-09-06 ENCOUNTER — Encounter: Payer: Self-pay | Admitting: Rheumatology

## 2019-09-06 ENCOUNTER — Ambulatory Visit: Payer: Medicare PPO | Admitting: Rheumatology

## 2019-09-06 VITALS — BP 134/73 | HR 80 | Resp 14 | Ht 63.5 in | Wt 180.0 lb

## 2019-09-06 DIAGNOSIS — M79672 Pain in left foot: Secondary | ICD-10-CM | POA: Diagnosis not present

## 2019-09-06 DIAGNOSIS — Z8719 Personal history of other diseases of the digestive system: Secondary | ICD-10-CM

## 2019-09-06 DIAGNOSIS — M79671 Pain in right foot: Secondary | ICD-10-CM | POA: Diagnosis not present

## 2019-09-06 DIAGNOSIS — F32A Depression, unspecified: Secondary | ICD-10-CM

## 2019-09-06 DIAGNOSIS — I10 Essential (primary) hypertension: Secondary | ICD-10-CM

## 2019-09-06 DIAGNOSIS — M25562 Pain in left knee: Secondary | ICD-10-CM

## 2019-09-06 DIAGNOSIS — M5030 Other cervical disc degeneration, unspecified cervical region: Secondary | ICD-10-CM

## 2019-09-06 DIAGNOSIS — M25561 Pain in right knee: Secondary | ICD-10-CM

## 2019-09-06 DIAGNOSIS — F329 Major depressive disorder, single episode, unspecified: Secondary | ICD-10-CM

## 2019-09-06 DIAGNOSIS — M25551 Pain in right hip: Secondary | ICD-10-CM | POA: Diagnosis not present

## 2019-09-06 DIAGNOSIS — M810 Age-related osteoporosis without current pathological fracture: Secondary | ICD-10-CM

## 2019-09-06 DIAGNOSIS — M25511 Pain in right shoulder: Secondary | ICD-10-CM

## 2019-09-06 DIAGNOSIS — G8929 Other chronic pain: Secondary | ICD-10-CM | POA: Diagnosis not present

## 2019-09-06 DIAGNOSIS — M79641 Pain in right hand: Secondary | ICD-10-CM | POA: Diagnosis not present

## 2019-09-06 DIAGNOSIS — R5383 Other fatigue: Secondary | ICD-10-CM

## 2019-09-06 DIAGNOSIS — C50511 Malignant neoplasm of lower-outer quadrant of right female breast: Secondary | ICD-10-CM

## 2019-09-06 DIAGNOSIS — M797 Fibromyalgia: Secondary | ICD-10-CM

## 2019-09-06 DIAGNOSIS — M79642 Pain in left hand: Secondary | ICD-10-CM

## 2019-09-06 DIAGNOSIS — N3011 Interstitial cystitis (chronic) with hematuria: Secondary | ICD-10-CM

## 2019-09-06 DIAGNOSIS — Z170 Estrogen receptor positive status [ER+]: Secondary | ICD-10-CM

## 2019-09-06 DIAGNOSIS — R7303 Prediabetes: Secondary | ICD-10-CM

## 2019-09-06 DIAGNOSIS — M25552 Pain in left hip: Secondary | ICD-10-CM

## 2019-09-06 DIAGNOSIS — Z8669 Personal history of other diseases of the nervous system and sense organs: Secondary | ICD-10-CM

## 2019-09-06 DIAGNOSIS — F419 Anxiety disorder, unspecified: Secondary | ICD-10-CM

## 2019-09-06 DIAGNOSIS — M722 Plantar fascial fibromatosis: Secondary | ICD-10-CM

## 2019-09-06 DIAGNOSIS — E041 Nontoxic single thyroid nodule: Secondary | ICD-10-CM

## 2019-09-06 DIAGNOSIS — N952 Postmenopausal atrophic vaginitis: Secondary | ICD-10-CM

## 2019-09-07 NOTE — Progress Notes (Signed)
Please add aldolase and myositis assessment panel.  We will repeat CK at the follow-up visit as well.

## 2019-09-08 ENCOUNTER — Other Ambulatory Visit: Payer: Self-pay | Admitting: *Deleted

## 2019-09-08 DIAGNOSIS — M25511 Pain in right shoulder: Secondary | ICD-10-CM

## 2019-09-08 DIAGNOSIS — R5383 Other fatigue: Secondary | ICD-10-CM

## 2019-09-08 DIAGNOSIS — G8929 Other chronic pain: Secondary | ICD-10-CM

## 2019-09-08 DIAGNOSIS — M79641 Pain in right hand: Secondary | ICD-10-CM

## 2019-09-08 DIAGNOSIS — M79642 Pain in left hand: Secondary | ICD-10-CM

## 2019-09-08 NOTE — Addendum Note (Signed)
Addended by: Carole Binning on: 09/08/2019 09:51 AM   Modules accepted: Orders

## 2019-09-13 ENCOUNTER — Ambulatory Visit (INDEPENDENT_AMBULATORY_CARE_PROVIDER_SITE_OTHER): Payer: Medicare PPO | Admitting: Family Medicine

## 2019-09-13 ENCOUNTER — Encounter (INDEPENDENT_AMBULATORY_CARE_PROVIDER_SITE_OTHER): Payer: Self-pay | Admitting: Family Medicine

## 2019-09-13 ENCOUNTER — Other Ambulatory Visit: Payer: Self-pay

## 2019-09-13 VITALS — BP 128/78 | HR 82 | Temp 98.5°F | Ht 63.0 in | Wt 170.0 lb

## 2019-09-13 DIAGNOSIS — R0602 Shortness of breath: Secondary | ICD-10-CM | POA: Diagnosis not present

## 2019-09-13 DIAGNOSIS — E669 Obesity, unspecified: Secondary | ICD-10-CM | POA: Diagnosis not present

## 2019-09-13 DIAGNOSIS — E66811 Obesity, class 1: Secondary | ICD-10-CM

## 2019-09-13 DIAGNOSIS — Z6830 Body mass index (BMI) 30.0-30.9, adult: Secondary | ICD-10-CM

## 2019-09-13 DIAGNOSIS — G8929 Other chronic pain: Secondary | ICD-10-CM

## 2019-09-13 DIAGNOSIS — M797 Fibromyalgia: Secondary | ICD-10-CM

## 2019-09-13 DIAGNOSIS — E559 Vitamin D deficiency, unspecified: Secondary | ICD-10-CM

## 2019-09-13 DIAGNOSIS — R7303 Prediabetes: Secondary | ICD-10-CM | POA: Diagnosis not present

## 2019-09-13 DIAGNOSIS — I10 Essential (primary) hypertension: Secondary | ICD-10-CM

## 2019-09-13 DIAGNOSIS — M25561 Pain in right knee: Secondary | ICD-10-CM

## 2019-09-13 NOTE — Progress Notes (Signed)
Chief Complaint:   OBESITY Shannon Hancock is here to discuss her progress with her obesity treatment plan along with follow-up of her obesity related diagnoses. Shannon Hancock is on the Category 1 Plan and states she is following her eating plan approximately 0% of the time. Shannon Hancock states she is exercising for 0 minutes 0 times per week.  Today's visit was #: 3 Starting weight: 173 lbs Starting date: 07/27/2019 Today's weight: 170 lbs Today's date: 09/13/2019 Total lbs lost to date: 3 lbs Total lbs lost since last in-office visit: 1 lb  Interim History: Shannon Hancock reports her sister is still living with her and dominates her food choices.  Shannon Hancock will be seeing a therapist soon and will work on boundaries.  She says she is open to journaling.  Subjective:   1. Prediabetes Shannon Hancock has a diagnosis of prediabetes based on her elevated HgA1c and was informed this puts her at greater risk of developing diabetes. She continues to work on diet and exercise to decrease her risk of diabetes. She denies nausea or hypoglycemia.  Lab Results  Component Value Date   HGBA1C 6.4 (H) 07/27/2019   Lab Results  Component Value Date   INSULIN 32.8 (H) 07/27/2019   2. Fibromyalgia Shannon Hancock has fibromyalgia and takes Robaxin.  3. Vitamin D deficiency Shannon Hancock's Vitamin D level was 45.7 on 07/27/2019. She is currently taking vit D. She denies nausea, vomiting or muscle weakness.  4. Essential hypertension Review: taking medications as instructed, no medication side effects noted, no chest pain on exertion, no dyspnea on exertion, no swelling of ankles. She takes Norvasc and Hyzaar.  BP Readings from Last 3 Encounters:  09/13/19 128/78  09/06/19 134/73  08/10/19 (!) 112/53   5. Chronic pain of right knee She sees Rheumatology for this and says they have okayed her using a recumbent bike.  6. SOB (shortness of breath) on exertion Shannon Hancock has been complaining of worsening shortness of breath.  Assessment/Plan:    1. Prediabetes Shannon Hancock will continue to work on weight loss, exercise, and decreasing simple carbohydrates to help decrease the risk of diabetes.   2. Fibromyalgia Recommend Shannon Hancock get PT or try pilates.  3. Vitamin D deficiency Low Vitamin D level contributes to fatigue and are associated with obesity, breast, and colon cancer. She agrees to continue to take Vitamin D @5000  IU daily and will follow-up for routine testing of Vitamin D, at least 2-3 times per year to avoid over-replacement.  4. Essential hypertension Shannon Hancock is working on healthy weight loss and exercise to improve blood pressure control. We will watch for signs of hypotension as she continues her lifestyle modifications.  5. Chronic pain of right knee Will follow because mobility and pain control are important for weight management.  6. SOB (shortness of breath) on exertion New IC today showed RMR of 1700.  7. Class 1 obesity with serious comorbidity and body mass index (BMI) of 30.0 to 30.9 in adult, unspecified obesity type Shannon Hancock is currently in the action stage of change. As such, her goal is to continue with weight loss efforts. She has agreed to keeping a food journal and adhering to recommended goals of 1200-1300 calories and 85-95 grams of protein.   Exercise goals: PT pilates  Behavioral modification strategies: increasing lean protein intake, increasing water intake and meal planning and cooking strategies.  Shannon Hancock has agreed to follow-up with our clinic in 2 weeks. She was informed of the importance of frequent follow-up visits to maximize her success with  intensive lifestyle modifications for her multiple health conditions.   Objective:   Blood pressure 128/78, pulse 82, temperature 98.5 F (36.9 C), temperature source Oral, height 5\' 3"  (1.6 m), weight 170 lb (77.1 kg), SpO2 98 %. Body mass index is 30.11 kg/m.  General: Cooperative, alert, well developed, in no acute distress. HEENT: Conjunctivae  and lids unremarkable. Cardiovascular: Regular rhythm.  Lungs: Normal work of breathing. Neurologic: No focal deficits.   Lab Results  Component Value Date   CREATININE 0.97 07/27/2019   BUN 18 07/27/2019   NA 141 07/27/2019   K 4.4 07/27/2019   CL 101 07/27/2019   CO2 24 07/27/2019   Lab Results  Component Value Date   ALT 24 07/27/2019   AST 23 07/27/2019   ALKPHOS 83 07/27/2019   BILITOT 0.2 07/27/2019   Lab Results  Component Value Date   HGBA1C 6.4 (H) 07/27/2019   Lab Results  Component Value Date   INSULIN 32.8 (H) 07/27/2019   Lab Results  Component Value Date   TSH 1.620 07/27/2019   Lab Results  Component Value Date   CHOL 190 07/27/2019   HDL 47 07/27/2019   LDLCALC 124 (H) 07/27/2019   LDLDIRECT 114.5 03/16/2013   TRIG 103 07/27/2019   CHOLHDL 5 03/16/2013   Lab Results  Component Value Date   WBC 6.4 07/27/2019   HGB 11.6 07/27/2019   HCT 36.7 07/27/2019   MCV 83 07/27/2019   PLT 208 07/27/2019   Lab Results  Component Value Date   IRON 51 07/27/2019   TIBC 302 07/27/2019   FERRITIN 239 (H) 07/27/2019   Obesity Behavioral Intervention:   Approximately 15 minutes were spent on the discussion below.  ASK: We discussed the diagnosis of obesity with Shannon Hancock today and Shannon Hancock agreed to give [place] permission to discuss obesity behavioral modification therapy today.  ASSESS: Shannon Hancock has the diagnosis of obesity and her BMI today is 30.2. Shannon Hancock is in the action stage of change.   ADVISE: Shannon Hancock was educated on the multiple health risks of obesity as well as the benefit of weight loss to improve her health. She was advised of the need for long term treatment and the importance of lifestyle modifications to improve her current health and to decrease her risk of future health problems.  AGREE: Multiple dietary modification options and treatment options were discussed and Shannon Hancock agreed to follow the recommendations documented in the above  note.  ARRANGE: Shannon Hancock was educated on the importance of frequent visits to treat obesity as outlined per CMS and USPSTF guidelines and agreed to schedule her next follow up appointment today.  Attestation Statements:   Reviewed by clinician on day of visit: allergies, medications, problem list, medical history, surgical history, family history, social history, and previous encounter notes.  I, Water quality scientist, CMA, am acting as Location manager for [employer], DO.  I have reviewed the above documentation for accuracy and completeness, and I agree with the above. Briscoe Deutscher, DO

## 2019-09-14 NOTE — Progress Notes (Signed)
I will discuss results at the follow-up visit.

## 2019-09-17 LAB — TEST AUTHORIZATION

## 2019-09-17 LAB — 14-3-3 ETA PROTEIN: 14-3-3 eta Protein: 0.2 ng/mL — ABNORMAL HIGH (ref <0.2)

## 2019-09-17 LAB — SEDIMENTATION RATE: Sed Rate: 19 mm/h (ref 0–30)

## 2019-09-17 LAB — MYOSITIS ASSESSR PLUS JO-1 AUTOABS
EJ Autoabs: NOT DETECTED
Jo-1 Autoabs: <1 AI
Ku Autoabs: NOT DETECTED
Mi-2 Autoabs: NOT DETECTED
OJ Autoabs: NOT DETECTED
PL-12 Autoabs: NOT DETECTED
PL-7 Autoabs: NOT DETECTED
SRP Autoabs: NOT DETECTED

## 2019-09-17 LAB — CK: Total CK: 340 U/L — ABNORMAL HIGH (ref 29–143)

## 2019-09-17 LAB — ANA: Anti Nuclear Antibody (ANA): NEGATIVE

## 2019-09-17 LAB — ANGIOTENSIN CONVERTING ENZYME: Angiotensin-Converting Enzyme: 29 U/L (ref 9–67)

## 2019-09-17 LAB — RHEUMATOID FACTOR: Rheumatoid fact SerPl-aCnc: <14 IU/mL (ref <14)

## 2019-09-17 LAB — CYCLIC CITRUL PEPTIDE ANTIBODY, IGG: Cyclic Citrullin Peptide Ab: <16 UNITS

## 2019-09-17 LAB — URIC ACID: Uric Acid, Serum: 5.1 mg/dL (ref 2.5–7.0)

## 2019-09-21 ENCOUNTER — Other Ambulatory Visit: Payer: Self-pay | Admitting: Sports Medicine

## 2019-09-21 ENCOUNTER — Ambulatory Visit (INDEPENDENT_AMBULATORY_CARE_PROVIDER_SITE_OTHER): Payer: Medicare PPO | Admitting: Family Medicine

## 2019-09-21 DIAGNOSIS — M25571 Pain in right ankle and joints of right foot: Secondary | ICD-10-CM

## 2019-09-22 ENCOUNTER — Ambulatory Visit (INDEPENDENT_AMBULATORY_CARE_PROVIDER_SITE_OTHER): Payer: Medicare PPO | Admitting: Psychology

## 2019-09-22 DIAGNOSIS — F411 Generalized anxiety disorder: Secondary | ICD-10-CM

## 2019-09-30 ENCOUNTER — Other Ambulatory Visit: Payer: Self-pay | Admitting: Hematology and Oncology

## 2019-10-04 NOTE — Progress Notes (Signed)
Office Visit Note  Patient: Shannon Hancock             Date of Birth: 1950/12/07           MRN: [id number]             PCP: [name], MD Referring: [name], MD Visit Date: 10/07/2019 Occupation: @GUAROCC @  Subjective:  Discuss lab work   History of Present Illness: Shannon Hancock is a 69 y.o. female with history of osteoarthritis, DDD, and elevated CK.  She reports she had cortisone injections in both knee joints on 09/20/19 performed by Dr. Delilah Shan. She states the her discomfort has improved significantly.  She states she is having less difficulty walking but still has discomfort climbing steps.  She continues to have discomfort in both feet.  She wears arch supports and supportive shoes.  She states she has intermittent swelling.  She has an upcoming MRI of the right ankle joint on 10/19/19. She is not having any discomfort in her hands or wrist joints at this time.  She has chronic neck and lower back pain.  She denies any symptoms of radiculopathy. She takes robaxin as needed for muscle spasms. She takes meloxicam 7.5 mg 1 tablet by mouth daily for pain relief.   Activities of Daily Living:  Patient reports morning stiffness for  0 minutes.   Patient Denies nocturnal pain.  Difficulty dressing/grooming: Denies Difficulty climbing stairs: Reports Difficulty getting out of chair: Denies Difficulty using hands for taps, buttons, cutlery, and/or writing: Denies  Review of Systems  Constitutional: Positive for fatigue.  HENT: Negative for mouth sores, mouth dryness and nose dryness.   Eyes: Positive for dryness. Negative for pain and visual disturbance.  Respiratory: Negative for cough, hemoptysis, shortness of breath and difficulty breathing.   Cardiovascular: Negative for chest pain, palpitations, hypertension and swelling in legs/feet.  Gastrointestinal: Negative for blood in stool, constipation and diarrhea.  Endocrine: Negative for increased urination.    Genitourinary: Negative for difficulty urinating and painful urination.  Musculoskeletal: Positive for arthralgias, joint pain, muscle weakness and muscle tenderness. Negative for joint swelling, myalgias, morning stiffness and myalgias.  Skin: Negative for color change, pallor, rash, hair loss, nodules/bumps, skin tightness, ulcers and sensitivity to sunlight.  Allergic/Immunologic: Negative for susceptible to infections.  Neurological: Positive for weakness. Negative for dizziness and headaches.  Hematological: Negative for bruising/bleeding tendency and swollen glands.  Psychiatric/Behavioral: Negative for depressed mood and sleep disturbance. The patient is not nervous/anxious.     PMFS History:  Patient Active Problem List   Diagnosis Date Noted  . Thyroid cyst 02/26/2018  . Plantar fasciitis 02/19/2016  . Family history of breast cancer in sister 03/10/2015  . Breast cancer of lower-outer quadrant of right female breast ([place]) 02/10/2015  . Abdominal bloating 12/16/2013  . Edema 11/09/2013  . Urge incontinence 05/05/2013  . Atrophic vaginitis 05/05/2013  . Syncope 02/15/2013  . Irritable bowel syndrome 08/20/2012  . Hearing loss 11/07/2011  . Generalized anxiety disorder 11/07/2011  . Peripheral neuropathy 07/05/2011  . Osteoporosis 07/05/2011  . Tinnitus, bilateral 04/05/2011  . HYPERCHOLESTEROLEMIA 04/05/2010  . ANEMIA, IRON DEFICIENCY 04/03/2010  . DEPRESSION, MAJOR, MODERATE 04/03/2010  . Essential hypertension 04/03/2010  . ALLERGIC RHINITIS 04/03/2010  . 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 POSTMENOPAUSAL SYNDROME 04/03/2010  . 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 FIBROMYALGIA 04/03/2010    Past Medical History:  Diagnosis Date  . Allergy   . Anxiety   .  Breast cancer ([place]) 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; ER/PR+, Her2-, ki67=10%  . Depression   . Diverticulosis   . Fibromyalgia   . GERD (gastroesophageal reflux  disease)   . Heart murmur   . High cholesterol   . Hypertension   . Joint pain   . Prediabetes     Family History  Problem Relation Age of Onset  . Alzheimer's disease Mother   . Stroke Mother   . Diabetes Mother   . Hypertension Mother   . Hyperlipidemia Mother   . Depression Mother   . Anxiety disorder Mother   . Coronary artery disease Father   . Hypertension Father   . Depression Father   . Prostate cancer Father 84  . Colon polyps Father        unspecified number  . Diabetes Father   . Hyperlipidemia Father   . Heart disease Father   . Kidney disease Father   . Breast cancer Sister 42  . Colon polyps Sister        3-4 total  . Urinary tract infection Sister   . Hypertension Brother   . Hyperlipidemia Brother   . Prostate cancer Brother 27  . Breast cancer Maternal Aunt        dx. 3s  . Kidney cancer Cousin        dx. 69s; smoker while in college  . Lung cancer Maternal Uncle        smoker  . Cancer Paternal Aunt        unspecified type  . Cirrhosis Paternal Uncle   . Colon cancer Maternal Grandmother 12  . Cancer Maternal Grandfather        unspecified type  . Parkinson's disease Maternal Grandfather   . Kidney cancer Paternal Grandfather        dx. 22s  . Healthy Daughter   . Breast cancer Cousin   . Lung cancer Other        MGM's sister  . Breast cancer Other   . Lung cancer Other   . Breast cancer Paternal Aunt 91  . Congestive Heart Failure Paternal Uncle   . Diabetes Paternal Uncle   . Healthy Daughter    Past Surgical History:  Procedure Laterality Date  . BREAST LUMPECTOMY WITH RADIOACTIVE SEED AND SENTINEL LYMPH NODE BIOPSY Right 02/13/2015   Procedure: RIGHT BREAST LUMPECTOMY WITH RADIOACTIVE SEED AND SENTINEL LYMPH NODE MAPPING;  Surgeon: [name], MD;  Location: [facility];  Service: General;  Laterality: Right;  . FOOT SURGERY Left   . TUBAL LIGATION     Social History   Social History Narrative   Regular  exercise-no   Living will: None (reviewed 2014)   Immunization History  Administered Date(s) Administered  . Influenza Split 07/05/2011  . Influenza Whole 04/03/2010, 04/04/2010  . Influenza,inj,Quad PF,6+ Mos 03/16/2013  . Moderna SARS-COVID-2 Vaccination 07/23/2019, 08/27/2019  . Td 07/01/2001     Objective: Vital Signs: BP 136/69 (BP Location: Left Arm, Patient Position: Sitting, Cuff Size: Normal)   Pulse 71   Resp 16   Ht 5' 3.5" (1.613 m)   Wt 177 lb 6.4 oz (80.5 kg)   BMI 30.93 kg/m    Physical Exam Vitals and nursing note reviewed.  Constitutional:      Appearance: She is well-developed.  HENT:     Head: Normocephalic and atraumatic.  Eyes:     Conjunctiva/sclera: Conjunctivae normal.  Pulmonary:     Effort: Pulmonary effort  is normal.  Abdominal:     General: Bowel sounds are normal.     Palpations: Abdomen is soft.  Musculoskeletal:     Cervical back: Normal range of motion.  Lymphadenopathy:     Cervical: No cervical adenopathy.  Skin:    General: Skin is warm and dry.     Capillary Refill: Capillary refill takes less than 2 seconds.  Neurological:     Mental Status: She is alert and oriented to person, place, and time.  Psychiatric:        Behavior: Behavior normal.      Musculoskeletal Exam: C-spine limited lateral rotation.  Thoracic and lumbar spine good ROM.  Shoulder joints, elbow joints, wrist joints, MCPs, PIPs, and DIPs good ROM with no synovitis.  Complete fist formation bilaterally.  Hip joints, knee joints, ankle joints, MTPs, PIPs, and DIPs good ROM with no synovitis.  No warmth or effusion of knee joints.  No tenderness or swelling of ankle joints.    CDAI Exam: CDAI Score: -- Patient Global: --; Provider Global: -- Swollen: --; Tender: -- Joint Exam 10/07/2019   No joint exam has been documented for this visit   There is currently no information documented on the homunculus. Go to the Rheumatology activity and complete the homunculus  joint exam.  Investigation: No additional findings.  Imaging: No results found.  Recent Labs: Lab Results  Component Value Date   WBC 6.4 07/27/2019   HGB 11.6 07/27/2019   PLT 208 07/27/2019   NA 141 07/27/2019   K 4.4 07/27/2019   CL 101 07/27/2019   CO2 24 07/27/2019   GLUCOSE 79 07/27/2019   BUN 18 07/27/2019   CREATININE 0.97 07/27/2019   BILITOT 0.2 07/27/2019   ALKPHOS 83 07/27/2019   AST 23 07/27/2019   ALT 24 07/27/2019   PROT 6.9 07/27/2019   ALBUMIN 4.9 (H) 07/27/2019   CALCIUM 10.1 07/27/2019   GFRAA 69 07/27/2019  March 2021 CK 340, myositis panel negative, ESR 19, RF negative, anti-CCP negative, 14 3 3  eta 0.2, ANA negative, ACE 29, uric acid 5.1  Speciality Comments: No specialty comments available.  Procedures:  No procedures performed Allergies: Olive oil and Oxycodone   Assessment / Plan:     Visit Diagnoses: Arthritis of right acromioclavicular joint: She has no tenderness or inflammation on exam today.  Lab work and x-rays from 09/06/19 were reviewed with the patient today in the office.  All questions were addressed.  She was advised to notify us if her symptoms persist or worsen.  She will follow up in 2 months.   Primary osteoarthritis of both hands: RF-, Anti-CCP negative, and 14-3-3 eta positive: She has no tenderness or synovitis on exam.  Mild PIP and DIP thickening consistent with osteoarthritis noted. No clinical features of rheumatoid arthritis on examination. Lab work was reviewed with the patient today in the office and all questions were addressed. Joint protection and muscle strengthening were discussed.   Primary osteoarthritis of both knees - Right knee joint severe osteoarthritis, left knee joint moderate osteoarthritis and bilateral moderate chondromalacia patella.  She has good ROM of both knee joints.  No warmth or effusion noted.  She had bilateral knee joint cortisone injections performed on 09/20/19 by Dr. Delilah Shan.  Her knee joint pain  has improved significantly.  She is planning on following up with Dr. Delilah Shan for repeat Gelsyn injections in the future.   Primary osteoarthritis of both feet: She has chronic pain in both feet.  She notices intermittent swelling in both feet and ankle joints.  Good ROM of both ankle joints noted.  No tenderness or inflammation was noted. She wears arch supports and supportive shoes, which are helpful.  She will be following up with Dr. Doran Durand for further evaluation. She has an upcoming MRI of the right ankle joint on 10/19/19.  Plantar fasciitis: Resolved. She wears arch supports bilaterally.    DDD (degenerative disc disease), cervical: She has limited ROM with lateral rotation.  She has no symptoms of radiculopathy.  She experiences stiffness in the C-spine but no discomfort at this time.  She was encouraged to perform neck ROM exercises.   Elevated CK - CK 340, myositis panel negative. Lab work was reviewed with the patient at length.  All questions were addressed. She is not having any muscle tenderness, myalgias, or muscle weakness at this time. CK and aldolase will be checked today. Plan repeat CK and aldolase.  Fibromyalgia: She has intermittent fibromyalgia flares. She takes robaxin 500 mg BID PRN for muscle spasms.   Age-related osteoporosis without current pathological fracture: She is taking a vitamin D supplement.   Other medical conditions are listed as follows:   Anxiety and depression  History of peripheral neuropathy  Essential hypertension  History of gastroesophageal reflux (GERD)  History of diverticulosis  History of IBS  Chronic interstitial cystitis with hematuria  Prediabetes  Malignant neoplasm of lower-outer quadrant of right breast of female, estrogen receptor positive ([place])  Orders: Orders Placed This Encounter  Procedures  . Aldolase  . CK   No orders of the defined types were placed in this encounter.   Face-to-face time spent with patient was  30 minutes. Greater than 50% of time was spent in counseling and coordination of care.  Follow-Up Instructions: Return in about 2 months (around 12/07/2019) for Osteoarthritis, DDD, Elevated CK.   Ofilia Neas, PA-C  I examined and evaluated the patient with Hazel Sams PA.  Patient has no muscle weakness or tenderness on examination today.  We will repeat her CK and aldolase today.  She continues to have some discomfort due to underlying osteoarthritis.  The plan of care was discussed as noted above.  Bo Merino, MD Note - This record has been created using Editor, commissioning.  Chart creation errors have been sought, but may not always  have been located. Such creation errors do not reflect on  the standard of medical care.

## 2019-10-05 ENCOUNTER — Ambulatory Visit (INDEPENDENT_AMBULATORY_CARE_PROVIDER_SITE_OTHER): Payer: Medicare PPO | Admitting: Family Medicine

## 2019-10-07 ENCOUNTER — Ambulatory Visit: Payer: Medicare PPO | Admitting: Rheumatology

## 2019-10-07 ENCOUNTER — Other Ambulatory Visit: Payer: Self-pay

## 2019-10-07 ENCOUNTER — Encounter: Payer: Self-pay | Admitting: Rheumatology

## 2019-10-07 VITALS — BP 136/69 | HR 71 | Resp 16 | Ht 63.5 in | Wt 177.4 lb

## 2019-10-07 DIAGNOSIS — M722 Plantar fascial fibromatosis: Secondary | ICD-10-CM

## 2019-10-07 DIAGNOSIS — M19041 Primary osteoarthritis, right hand: Secondary | ICD-10-CM | POA: Diagnosis not present

## 2019-10-07 DIAGNOSIS — R748 Abnormal levels of other serum enzymes: Secondary | ICD-10-CM | POA: Diagnosis not present

## 2019-10-07 DIAGNOSIS — M810 Age-related osteoporosis without current pathological fracture: Secondary | ICD-10-CM

## 2019-10-07 DIAGNOSIS — N3011 Interstitial cystitis (chronic) with hematuria: Secondary | ICD-10-CM

## 2019-10-07 DIAGNOSIS — M19011 Primary osteoarthritis, right shoulder: Secondary | ICD-10-CM | POA: Diagnosis not present

## 2019-10-07 DIAGNOSIS — M19042 Primary osteoarthritis, left hand: Secondary | ICD-10-CM

## 2019-10-07 DIAGNOSIS — M5030 Other cervical disc degeneration, unspecified cervical region: Secondary | ICD-10-CM

## 2019-10-07 DIAGNOSIS — Z170 Estrogen receptor positive status [ER+]: Secondary | ICD-10-CM

## 2019-10-07 DIAGNOSIS — Z8719 Personal history of other diseases of the digestive system: Secondary | ICD-10-CM

## 2019-10-07 DIAGNOSIS — I10 Essential (primary) hypertension: Secondary | ICD-10-CM

## 2019-10-07 DIAGNOSIS — M19072 Primary osteoarthritis, left ankle and foot: Secondary | ICD-10-CM

## 2019-10-07 DIAGNOSIS — F329 Major depressive disorder, single episode, unspecified: Secondary | ICD-10-CM

## 2019-10-07 DIAGNOSIS — R7303 Prediabetes: Secondary | ICD-10-CM

## 2019-10-07 DIAGNOSIS — M19071 Primary osteoarthritis, right ankle and foot: Secondary | ICD-10-CM | POA: Diagnosis not present

## 2019-10-07 DIAGNOSIS — M797 Fibromyalgia: Secondary | ICD-10-CM

## 2019-10-07 DIAGNOSIS — Z8669 Personal history of other diseases of the nervous system and sense organs: Secondary | ICD-10-CM

## 2019-10-07 DIAGNOSIS — M170 Bilateral primary osteoarthritis of knee: Secondary | ICD-10-CM

## 2019-10-07 DIAGNOSIS — F419 Anxiety disorder, unspecified: Secondary | ICD-10-CM

## 2019-10-07 DIAGNOSIS — C50511 Malignant neoplasm of lower-outer quadrant of right female breast: Secondary | ICD-10-CM

## 2019-10-08 LAB — CK: Total CK: 216 U/L — ABNORMAL HIGH (ref 29–143)

## 2019-10-08 LAB — ALDOLASE: Aldolase: 8.3 U/L — ABNORMAL HIGH (ref <=8.1)

## 2019-10-10 NOTE — Progress Notes (Signed)
Patient Care Team: [name], MD as PCP - General [name], MD as Consulting Physician (General Surgery) Kyung Rudd, MD as Consulting Physician (Radiation Oncology) Sylvan Cheese, NP as Nurse Practitioner (Hematology and Oncology) Nicholas Lose, MD as Consulting Physician (Hematology and Oncology)  DIAGNOSIS:    ICD-10-CM   1. Malignant neoplasm of lower-outer quadrant of right breast of female, estrogen receptor positive ([place])  C50.511    Z17.0     SUMMARY OF ONCOLOGIC HISTORY: Oncology History Overview Note  Breast cancer of lower-outer quadrant of right female breast   Staging form: Breast, AJCC 7th Edition     Clinical: Stage IA (T1b, N0, M0) - Unsigned     Pathologic stage from 02/13/2015: Stage IB (T1b, N17m, cM0) - Unsigned      [place] of [place]  01/05/2015 Mammogram   Right breast: possible mass warranting further evaluation   01/05/2015 Breast MRI   10 mm diameter enhancing mass in the lower outer right breast suspicious for malignancy   01/18/2015 Mammogram   Diagnostic mammo and ultrasound showed a 0.6 cm mass in the right breast 8:00 location 6 cm from the nipple, and a 0.5 cm lesion in the 10:00 location of the right breast 6 cm from the nipple. Right axilla was negative for adenopathy.   01/19/2015 Initial Biopsy   Right breast 8:00 mass biopsy showed invasive ductal carcinoma and DCIS, grade 1-2, the 10:00 mass biopsy showed fibrocystic change.ER 100% positive, PR 90% positive, HER-2 negative, Ki-67 10%   01/19/2015 Clinical Stage   Stage IA: T1b N0   02/13/2015 Definitive Surgery   Right breast lumpectomy: margins negative; Invasive ductal carcinoma, G1, 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 1 out of 2 lymph nodes were positive for micrometastasis.   02/13/2015 Oncotype testing   RS 12 (8% ROR)   02/13/2015 Pathologic Stage   Stage IB: T1b N1(mi)   03/10/2015 Procedure   Breast/Ovarian  Panel (Gene Dx): no clinically significant variant at ATM, BARD1, BRCA1, BRCA2, BRIP1, CDH1, CHEK2, FANCC, MLH1, MSH2, MSH6, NBN, PALB2, PMS2, PTEN, RAD51C, RAD51D, TP53, and XRCC2.    03/20/2015 - 04/14/2015 Radiation Therapy   Adjuvant XRT ([facility]: 42.5 Gy over 17 fractions to the breast using whole-breast tangent fields. Right breast boost 7.5 Gy over 3 fractions. Total dose: 50 Gy   04/19/2015 -  Anti-estrogen oral therapy   Anastrozole 1 mg daily. Planned duration of therapy 5-10 years.   07/05/2015 Survivorship   Survivorship care plan completed and mailed to patient in lieu of in person visit.   11/15/2015 Breast MRI   No evidence of breast malignancy. Anterior and lateral skin thickening consistent with radiation change     CHIEF COMPLIANT: Follow-up of right breast cancer on anastrozole therapy  INTERVAL HISTORY: Shannon SERESis a 69y.o. with above-mentioned history of right breast cancer treated with lumpectomy,radiation,and whois currently on anastrozole. Shannon Hancock presents to the clinic todayfor follow-up. Shannon Hancock reports intermittent tenderness in the right breast and axilla that can be sharp and uncomfortable.  Shannon Hancock continues to gain weight.  ALLERGIES:  is allergic to olive oil and oxycodone.  MEDICATIONS:  Current Outpatient Medications  Medication Sig Dispense Refill  . acetaminophen (TYLENOL) 325 MG tablet Take 650 mg by mouth every 6 (six) hours as needed.    . ALPRAZolam (XANAX) 1 MG tablet TAKE 1 TABLET BY MOUTH 4 TIMES A DAY AS NEEDED FOR SLEEP OR ANXIETY 120 tablet 0  .  amLODipine (NORVASC) 5 MG tablet Take 5 mg by mouth daily.    . budesonide-formoterol (SYMBICORT) 80-4.5 MCG/ACT inhaler Inhale 2 puffs into the lungs 2 (two) times daily.    . Cholecalciferol (VITAMIN D) 125 MCG (5000 UT) CAPS Take 1 capsule by mouth daily.    . fexofenadine (ALLEGRA) 180 MG tablet Take 180 mg by mouth daily.    . fluticasone (FLONASE) 50 MCG/ACT nasal spray Place into both  nostrils as needed for allergies or rhinitis.    . Lactobacillus (PROBIOTIC ACIDOPHILUS PO) Take by mouth.    . letrozole (FEMARA) 2.5 MG tablet TAKE 1 TABLET BY MOUTH EVERY DAY 90 tablet 0  . losartan-hydrochlorothiazide (HYZAAR) 100-12.5 MG per tablet TAKE 1 TABLET BY MOUTH DAILY. 30 tablet 0  . LYRICA 100 MG capsule TAKE ONE CAPSULE TWICE A DAY 60 capsule 3  . magnesium oxide (MAG-OX) 400 MG tablet Take 400 mg by mouth daily.    . meloxicam (MOBIC) 7.5 MG tablet Take 1 tablet (7.5 mg total) by mouth daily. Reported on 11/15/2015 (Patient taking differently: Take 7.5 mg by mouth 2 (two) times daily. )    . methocarbamol (ROBAXIN) 500 MG tablet 2 (two) times daily as needed.    . metoprolol succinate (TOPROL-XL) 25 MG 24 hr tablet     . omeprazole (PRILOSEC) 40 MG capsule omeprazole 40 mg capsule,delayed release  1 CAPSULE 30 MINUTES BEFORE MORNING MEAL ONCE A DAY ORALLY 90 DAYS    . psyllium (METAMUCIL) 0.52 g capsule Take 1 capsule (0.52 g total) by mouth daily.    Marland Kitchen ZETIA 10 MG tablet TAKE 1 TABLET BY MOUTH DAILY. 90 tablet 1   No current facility-administered medications for this visit.    PHYSICAL EXAMINATION: ECOG PERFORMANCE STATUS: 1 - Symptomatic but completely ambulatory  Vitals:   10/11/19 1017  BP: 133/63  Pulse: 87  Resp: 17  Temp: 97.8 F (36.6 C)  SpO2: 99%   Filed Weights   10/11/19 1017  Weight: 178 lb 12.8 oz (81.1 kg)    LABORATORY DATA:  I have reviewed the data as listed CMP Latest Ref Rng & Units 07/27/2019 03/03/2019 11/28/2015  Glucose 65 - 99 mg/dL 79 - -  BUN 8 - 27 mg/dL 18 - -  Creatinine 0.57 - 1.00 mg/dL 0.97 1.00 -  Sodium 134 - 144 mmol/L 141 - -  Potassium 3.5 - 5.2 mmol/L 4.4 - -  Chloride 96 - 106 mmol/L 101 - -  CO2 20 - 29 mmol/L 24 - -  Calcium 8.7 - 10.3 mg/dL 10.1 - -  Total Protein 6.0 - 8.5 g/dL 6.9 - 7.4  Total Bilirubin 0.0 - 1.2 mg/dL 0.2 - 0.5  Alkaline Phos 39 - 117 IU/L 83 - 73  AST 0 - 40 IU/L 23 - 31  ALT 0 - 32 IU/L 24 -  21    Lab Results  Component Value Date   WBC 6.4 07/27/2019   HGB 11.6 07/27/2019   HCT 36.7 07/27/2019   MCV 83 07/27/2019   PLT 208 07/27/2019   NEUTROABS 3.4 07/27/2019    ASSESSMENT & PLAN:  Breast cancer of lower-outer quadrant of right female breast Right breast lumpectomy 02/13/2015: margins negative; Invasive ductal carcinoma, G1, 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 1 out of 2 lymph nodes were positive for micrometastasis.Oncotype 12 (8% ROR) T1b N1 mic stage IB XRT from 03/20/15 to 04/14/2015  Current treatment: Anastrozole 1 mg daily started November 2016 switched to letrozole 03/01/2019 Letrozole  toxicities: 1.  Diffuse arthralgias and myalgias: I discussed with her about different over-the-counter options including turmeric, CBD oils, magnesium, topical measures like Voltaren gel etc.  We will also refer her to see Dr.Deveshwar. 2.  Osteopenia: Bone density 04/23/2018: T score -1.5: Continue with calcium and vitamin D and weightbearing exercises 3.  Weight gain: Monitoring  Breast cancer surveillance: 1.  Breast exam 10/11/2019: Benign 2.  Mammogram 02/23/2019: Benign breast density category C 3.  Breast MRI 03/04/2019: Postsurgical and radiation changes no evidence of malignancy, breast density category C  We would like you to MRI 6 months from the mammograms.  Right breast tenderness: I discussed with her that it is most likely scar tissue.  I provided her with a prescription for her to be fitted with bras. Return to clinic in 1 year for follow-up    No orders of the defined types were placed in this encounter.  The patient has a good understanding of the overall plan. Shannon Hancock agrees with it. Shannon Hancock will call with any problems that may develop before the next visit here.  Total time spent: 20 mins including face to face time and time spent for planning, charting and coordination of care  Nicholas Lose, MD 10/11/2019  I, Cloyde Reams Dorshimer, am acting as scribe for  Dr. Nicholas Lose.  I have reviewed the above documentation for accuracy and completeness, and I agree with the above.

## 2019-10-11 ENCOUNTER — Inpatient Hospital Stay: Payer: Medicare PPO | Attending: Hematology and Oncology | Admitting: Hematology and Oncology

## 2019-10-11 ENCOUNTER — Other Ambulatory Visit: Payer: Self-pay

## 2019-10-11 ENCOUNTER — Telehealth: Payer: Self-pay | Admitting: *Deleted

## 2019-10-11 DIAGNOSIS — C50511 Malignant neoplasm of lower-outer quadrant of right female breast: Secondary | ICD-10-CM | POA: Diagnosis not present

## 2019-10-11 DIAGNOSIS — Z170 Estrogen receptor positive status [ER+]: Secondary | ICD-10-CM | POA: Insufficient documentation

## 2019-10-11 DIAGNOSIS — M791 Myalgia, unspecified site: Secondary | ICD-10-CM | POA: Insufficient documentation

## 2019-10-11 DIAGNOSIS — Z885 Allergy status to narcotic agent status: Secondary | ICD-10-CM | POA: Diagnosis not present

## 2019-10-11 DIAGNOSIS — Z79811 Long term (current) use of aromatase inhibitors: Secondary | ICD-10-CM | POA: Diagnosis not present

## 2019-10-11 DIAGNOSIS — M2550 Pain in unspecified joint: Secondary | ICD-10-CM | POA: Diagnosis not present

## 2019-10-11 DIAGNOSIS — Z79899 Other long term (current) drug therapy: Secondary | ICD-10-CM | POA: Insufficient documentation

## 2019-10-11 DIAGNOSIS — M8580 Other specified disorders of bone density and structure, unspecified site: Secondary | ICD-10-CM | POA: Diagnosis not present

## 2019-10-11 DIAGNOSIS — R748 Abnormal levels of other serum enzymes: Secondary | ICD-10-CM

## 2019-10-11 MED ORDER — ANASTROZOLE 1 MG PO TABS: 1.0000 mg | ORAL_TABLET | Freq: Every day | ORAL | 3 refills | Status: DC

## 2019-10-11 MED ORDER — ALENDRONATE SODIUM 70 MG PO TABS: 70.0000 mg | ORAL_TABLET | ORAL | 3 refills | Status: DC

## 2019-10-11 NOTE — Assessment & Plan Note (Signed)
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 1 out of 2 lymph nodes were positive for micrometastasis.Oncotype 12 (8% ROR) T1b N1 mic stage IB XRT from 03/20/15 to 04/14/2015  Current treatment: Anastrozole 1 mg daily started November 2016 switched to letrozole 03/01/2019 Letrozole toxicities: 1.  Diffuse arthralgias and myalgias: I discussed with her about different over-the-counter options including turmeric, CBD oils, magnesium, topical measures like Voltaren gel etc.  We will also refer her to see Dr.Deveshwar. 2.  Osteopenia: Bone density 04/23/2018: T score -1.5: Continue with calcium and vitamin D and weightbearing exercises 3.  Weight gain: Monitoring  Breast cancer surveillance: 1.  Breast exam 10/11/2019: Benign 2.  Mammogram 02/23/2019: Benign breast density category C 3.  Breast MRI 03/04/2019: Postsurgical and radiation changes no evidence of malignancy, breast density category C    Return to clinic in 1 year for follow-up

## 2019-10-11 NOTE — Telephone Encounter (Signed)
-----   Message from Ofilia Neas, PA-C sent at 10/11/2019 12:46 PM EDT ----- CK elevated-216 but trending down.  Aldolase is borderline elevated.  Discussed with Dr. Estanislado Pandy.  She would like the patient to have CK and aldolase rechecked in 1 month.

## 2019-10-11 NOTE — Progress Notes (Signed)
CK elevated-216 but trending down.  Aldolase is borderline elevated.  Discussed with Dr. Estanislado Pandy.  She would like the patient to have CK and aldolase rechecked in 1 month.

## 2019-10-12 ENCOUNTER — Telehealth: Payer: Self-pay | Admitting: Hematology and Oncology

## 2019-10-12 NOTE — Telephone Encounter (Signed)
Scheduled per los, patient has been called and voicemail is full.

## 2019-10-19 ENCOUNTER — Other Ambulatory Visit: Payer: Self-pay

## 2019-10-19 ENCOUNTER — Ambulatory Visit
Admission: RE | Admit: 2019-10-19 | Discharge: 2019-10-19 | Disposition: A | Discharge status: Home / Self Care | Payer: Medicare PPO | Source: Ambulatory Visit | Attending: Sports Medicine | Admitting: Sports Medicine

## 2019-10-19 DIAGNOSIS — S86811A Strain of other muscle(s) and tendon(s) at lower leg level, right leg, initial encounter: Secondary | ICD-10-CM | POA: Diagnosis not present

## 2019-10-19 DIAGNOSIS — M25571 Pain in right ankle and joints of right foot: Secondary | ICD-10-CM

## 2019-10-25 DIAGNOSIS — N3281 Overactive bladder: Secondary | ICD-10-CM | POA: Diagnosis not present

## 2019-10-25 DIAGNOSIS — R300 Dysuria: Secondary | ICD-10-CM | POA: Diagnosis not present

## 2019-11-04 DIAGNOSIS — E559 Vitamin D deficiency, unspecified: Secondary | ICD-10-CM | POA: Diagnosis not present

## 2019-11-04 DIAGNOSIS — R7303 Prediabetes: Secondary | ICD-10-CM | POA: Diagnosis not present

## 2019-11-04 DIAGNOSIS — H6123 Impacted cerumen, bilateral: Secondary | ICD-10-CM | POA: Diagnosis not present

## 2019-11-04 DIAGNOSIS — I10 Essential (primary) hypertension: Secondary | ICD-10-CM | POA: Diagnosis not present

## 2019-11-04 DIAGNOSIS — E785 Hyperlipidemia, unspecified: Secondary | ICD-10-CM | POA: Diagnosis not present

## 2019-11-08 ENCOUNTER — Other Ambulatory Visit: Payer: Self-pay

## 2019-11-08 DIAGNOSIS — R748 Abnormal levels of other serum enzymes: Secondary | ICD-10-CM

## 2019-11-09 LAB — ALDOLASE: Aldolase: 6.8 U/L (ref <=8.1)

## 2019-11-09 LAB — CK: Total CK: 277 U/L — ABNORMAL HIGH (ref 29–143)

## 2019-11-09 NOTE — Progress Notes (Signed)
CK is elevated but is stable.  Aldolase is pending.

## 2019-11-10 ENCOUNTER — Other Ambulatory Visit: Payer: Self-pay

## 2019-11-10 ENCOUNTER — Ambulatory Visit
Admission: RE | Admit: 2019-11-10 | Discharge: 2019-11-10 | Disposition: A | Discharge status: Home / Self Care | Payer: Medicare PPO | Source: Ambulatory Visit | Attending: Hematology and Oncology | Admitting: Hematology and Oncology

## 2019-11-10 DIAGNOSIS — Z170 Estrogen receptor positive status [ER+]: Secondary | ICD-10-CM

## 2019-11-10 DIAGNOSIS — Z853 Personal history of malignant neoplasm of breast: Secondary | ICD-10-CM | POA: Diagnosis not present

## 2019-11-10 MED ORDER — GADOBUTROL 1 MMOL/ML IV SOLN
8.0000 mL | Freq: Once | INTRAVENOUS | Status: AC | PRN
  Administered 2019-11-10: 8 mL via INTRAVENOUS

## 2019-11-10 NOTE — Progress Notes (Signed)
CK is elevated, aldolase is normal.  Patient was asymptomatic.  We will recheck CK in the future if needed.

## 2019-11-11 DIAGNOSIS — E1169 Type 2 diabetes mellitus with other specified complication: Secondary | ICD-10-CM | POA: Diagnosis not present

## 2019-11-11 DIAGNOSIS — Z23 Encounter for immunization: Secondary | ICD-10-CM | POA: Diagnosis not present

## 2019-11-25 NOTE — Progress Notes (Signed)
Office Visit Note  Patient: Shannon Hancock             Date of Birth: [birth year]           MRN: [id number]             PCP: [name], MD Referring: [name], MD Visit Date: 12/09/2019 Occupation: _0 @  Subjective:  Joint stiffness.   History of Present Illness: KARAH CARUTHERS is a 69 y.o. female with history of osteoarthritis.  She states she had a cortisone injection to her knee joints by Dr. Delilah Shan about 4 weeks ago which gave her good relief.  She still have some ongoing discomfort in the plantar fascia when she goes for a long walk.  She continues to have some stiffness in her hands and feet due to underlying osteoarthritis but not much pain.  Right shoulder joint is doing better now.  Activities of Daily Living:  Patient reports morning stiffness for a few  minutes.   Patient Denies nocturnal pain.  Difficulty dressing/grooming: Denies Difficulty climbing stairs: Reports Difficulty getting out of chair: Denies Difficulty using hands for taps, buttons, cutlery, and/or writing: Denies  Review of Systems  Constitutional: Positive for fatigue. Negative for night sweats, weight gain and weight loss.  HENT: Positive for mouth dryness. Negative for mouth sores, trouble swallowing, trouble swallowing and nose dryness.   Eyes: Positive for dryness. Negative for pain, redness, itching and visual disturbance.  Respiratory: Negative for cough, shortness of breath and difficulty breathing.   Cardiovascular: Negative for chest pain, palpitations, hypertension, irregular heartbeat and swelling in legs/feet.  Gastrointestinal: Negative for blood in stool, constipation and diarrhea.  Endocrine: Negative for increased urination.  Genitourinary: Negative for difficulty urinating and vaginal dryness.  Musculoskeletal: Positive for arthralgias, joint pain, myalgias, morning stiffness, muscle tenderness and myalgias. Negative for joint swelling and muscle weakness.    Skin: Negative for color change, rash, hair loss, redness, skin tightness, ulcers and sensitivity to sunlight.  Allergic/Immunologic: Negative for susceptible to infections.  Neurological: Negative for dizziness, numbness, headaches, memory loss, night sweats and weakness.  Hematological: Negative for bruising/bleeding tendency and swollen glands.  Psychiatric/Behavioral: Negative for depressed mood, confusion and sleep disturbance. The patient is not nervous/anxious.     PMFS History:  Patient Active Problem List   Diagnosis Date Noted  . Thyroid cyst 02/26/2018  . Plantar fasciitis 02/19/2016  . Family history of breast cancer in sister 03/10/2015  . Breast cancer of lower-outer quadrant of right female breast ([place]) 02/10/2015  . Abdominal bloating 12/16/2013  . Edema 11/09/2013  . Urge incontinence 05/05/2013  . Atrophic vaginitis 05/05/2013  . Syncope 02/15/2013  . Irritable bowel syndrome 08/20/2012  . Hearing loss 11/07/2011  . Generalized anxiety disorder 11/07/2011  . Peripheral neuropathy 07/05/2011  . Osteoporosis 07/05/2011  . Tinnitus, bilateral 04/05/2011  . HYPERCHOLESTEROLEMIA 04/05/2010  . ANEMIA, IRON DEFICIENCY 04/03/2010  . DEPRESSION, MAJOR, MODERATE 04/03/2010  . Essential hypertension 04/03/2010  . ALLERGIC RHINITIS 04/03/2010  . 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 POSTMENOPAUSAL SYNDROME 04/03/2010  . 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 FIBROMYALGIA 04/03/2010    Past Medical History:  Diagnosis Date  . Allergy   . Anxiety   . Breast cancer ([place]) 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; ER/PR+, Her2-, ki67=10%  . Depression   . Diverticulosis   . Fibromyalgia   . GERD (gastroesophageal reflux disease)   . Heart murmur   . High cholesterol   .  Hypertension   . Joint pain   . Prediabetes     Family History  Problem Relation Age of Onset  . Alzheimer's disease Mother   . Stroke Mother   . Diabetes  Mother   . Hypertension Mother   . Hyperlipidemia Mother   . Depression Mother   . Anxiety disorder Mother   . Coronary artery disease Father   . Hypertension Father   . Depression Father   . Prostate cancer Father 6  . Colon polyps Father        unspecified number  . Diabetes Father   . Hyperlipidemia Father   . Heart disease Father   . Kidney disease Father   . Breast cancer Sister 33  . Colon polyps Sister        3-4 total  . Urinary tract infection Sister   . Hypertension Brother   . Hyperlipidemia Brother   . Prostate cancer Brother 52  . Breast cancer Maternal Aunt        dx. 56s  . Kidney cancer Cousin        dx. 66s; smoker while in college  . Lung cancer Maternal Uncle        smoker  . Cancer Paternal Aunt        unspecified type  . Cirrhosis Paternal Uncle   . Colon cancer Maternal Grandmother 77  . Cancer Maternal Grandfather        unspecified type  . Parkinson's disease Maternal Grandfather   . Kidney cancer Paternal Grandfather        dx. 27s  . Healthy Daughter   . Breast cancer Cousin   . Lung cancer Other        MGM's sister  . Breast cancer Other   . Lung cancer Other   . Breast cancer Paternal Aunt 25  . Congestive Heart Failure Paternal Uncle   . Diabetes Paternal Uncle   . Healthy Daughter    Past Surgical History:  Procedure Laterality Date  . BREAST LUMPECTOMY WITH RADIOACTIVE SEED AND SENTINEL LYMPH NODE BIOPSY Right 02/13/2015   Procedure: RIGHT BREAST LUMPECTOMY WITH RADIOACTIVE SEED AND SENTINEL LYMPH NODE MAPPING;  Surgeon: [name], MD;  Location: [facility];  Service: General;  Laterality: Right;  . FOOT SURGERY Left   . TUBAL LIGATION     Social History   Social History Narrative   Regular exercise-no   Living will: None (reviewed 2014)   Immunization History  Administered Date(s) Administered  . Influenza Split 07/05/2011  . Influenza Whole 04/03/2010, 04/04/2010  . Influenza,inj,Quad PF,6+ Mos  03/16/2013  . Moderna SARS-COVID-2 Vaccination 07/23/2019, 08/27/2019  . Td 07/01/2001     Objective: Vital Signs: BP 104/64 (BP Location: Left Arm, Patient Position: Sitting, Cuff Size: Normal)   Pulse 71   Resp 15   Ht _0  (1.6 m)   Wt 174 lb 9.6 oz (79.2 kg)   BMI 30.93 kg/m    Physical Exam Vitals and nursing note reviewed.  Constitutional:      Appearance: She is well-developed.  HENT:     Head: Normocephalic and atraumatic.  Eyes:     Conjunctiva/sclera: Conjunctivae normal.  Cardiovascular:     Rate and Rhythm: Normal rate and regular rhythm.     Heart sounds: Normal heart sounds.  Pulmonary:     Effort: Pulmonary effort is normal.     Breath sounds: Normal breath sounds.  Abdominal:     General: Bowel sounds are  normal.     Palpations: Abdomen is soft.  Musculoskeletal:     Cervical back: Normal range of motion.  Lymphadenopathy:     Cervical: No cervical adenopathy.  Skin:    General: Skin is warm and dry.     Capillary Refill: Capillary refill takes less than 2 seconds.  Neurological:     Mental Status: She is alert and oriented to person, place, and time.  Psychiatric:        Behavior: Behavior normal.      Musculoskeletal Exam: C-spine was in good range of motion.  Shoulder joints, elbow joints, wrist joints with good range of motion.  She has  DIP and PIP thickening in her hands and feet.  She has some tenderness over like plantar fascia.  Hip joints were in good range of motion.  Her right knee joint has limited extension.  CDAI Exam: CDAI Score: -- Patient Global: --; Provider Global: -- Swollen: --; Tender: -- Joint Exam 12/09/2019   No joint exam has been documented for this visit   There is currently no information documented on the homunculus. Go to the Rheumatology activity and complete the homunculus joint exam.  Investigation: No additional findings.  Imaging: MR BREAST BILATERAL W WO CONTRAST INC CAD  Result Date:  11/10/2019 CLINICAL DATA:  Greater than 20% calculated lifetime risk of developing breast cancer. Status post right lumpectomy and radiation therapy for breast cancer in 2016. LABS:  None obtained on site today. EXAM: BILATERAL BREAST MRI WITH AND WITHOUT CONTRAST TECHNIQUE: Multiplanar, multisequence MR images of both breasts were obtained prior to and following the intravenous administration of 8 ml of Gadavist Three-dimensional MR images were rendered by post-processing of the original MR data on an independent workstation. The three-dimensional MR images were interpreted, and findings are reported in the following complete MRI report for this study. Three dimensional images were evaluated at the independent Dyna[employer] workstation COMPARISON:  Previous examinations, including the bilateral breast MR dated 03/03/2019 and bilateral diagnostic mammogram dated 02/23/2019. FINDINGS: Breast composition: c. Heterogeneous fibroglandular tissue. Background parenchymal enhancement: Minimal Right breast: Stable post lumpectomy changes on the right. No mass or abnormal enhancement. Left breast: No mass or abnormal enhancement. Lymph nodes: No abnormal appearing lymph nodes. Ancillary findings:  None. IMPRESSION: No evidence of malignancy. RECOMMENDATION: Annual screening mammography and annual screening MRI of the breasts. BI-RADS CATEGORY  2: Benign. Electronically Signed   By: Claudie Revering M.D.   On: 11/10/2019 17:20    Recent Labs: Lab Results  Component Value Date   WBC 6.4 07/27/2019   HGB 11.6 07/27/2019   PLT 208 07/27/2019   NA 141 07/27/2019   K 4.4 07/27/2019   CL 101 07/27/2019   CO2 24 07/27/2019   GLUCOSE 79 07/27/2019   BUN 18 07/27/2019   CREATININE 0.97 07/27/2019   BILITOT 0.2 07/27/2019   ALKPHOS 83 07/27/2019   AST 23 07/27/2019   ALT 24 07/27/2019   PROT 6.9 07/27/2019   ALBUMIN 4.9 (H) 07/27/2019   CALCIUM 10.1 07/27/2019   GFRAA 69 07/27/2019    Speciality Comments: No specialty  comments available.  Procedures:  No procedures performed Allergies: Olive oil and Oxycodone   Assessment / Plan:     Visit Diagnoses: Arthritis of right acromioclavicular joint-doing better she had good range of motion.  Primary osteoarthritis of both hands - RF-, Anti-CCP negative, and 14-3-3 eta positive.  Patient had no synovitis.  Joint protection was discussed.  Primary osteoarthritis of both knees -  Right knee joint severe osteoarthritis, left knee joint moderate osteoarthritis and bilateral moderate chondromalacia patella.  She had good response to cortisone injections given by Dr. Delilah Shan.  She still has limited extension of her right knee joint.  She would benefit from right total knee replacement.  She also wants to try physical therapy.  She will contact Dr. Delilah Shan.  Have given her a handout on knee joint exercises.  Primary osteoarthritis of both feet-doing better with proper fitting shoes.  Plantar fasciitis -she has recurrent problem.  She has appointment with Dr. Doran Durand.  DDD (degenerative disc disease), cervical -patient had fairly good range of motion of cervical spine.  Elevated CK - CK 340, myositis panel negative.  Repeat aldolase was normal.  She had no muscle weakness.  Fibromyalgia-she continues to have some generalized pain.  Age-related osteoporosis without current pathological fracture-she is on alendronate by her oncologist.  Her other medical problems are listed as follows:  History of peripheral neuropathy  Anxiety and depression  Prediabetes  History of diverticulosis  History of IBS  Essential hypertension  History of gastroesophageal reflux (GERD)  Chronic interstitial cystitis with hematuria  Malignant neoplasm of lower-outer quadrant of right breast of female, estrogen receptor positive ([place])  Orders: No orders of the defined types were placed in this encounter.  No orders of the defined types were placed in this  encounter.    Follow-Up Instructions: Return in about 6 months (around 06/09/2020) for Osteoarthritis.   Bo Merino, MD  Note - This record has been created using Editor, commissioning.  Chart creation errors have been sought, but may not always  have been located. Such creation errors do not reflect on  the standard of medical care.

## 2019-12-01 DIAGNOSIS — N3281 Overactive bladder: Secondary | ICD-10-CM | POA: Diagnosis not present

## 2019-12-01 DIAGNOSIS — R102 Pelvic and perineal pain: Secondary | ICD-10-CM | POA: Diagnosis not present

## 2019-12-01 DIAGNOSIS — R300 Dysuria: Secondary | ICD-10-CM | POA: Diagnosis not present

## 2019-12-02 ENCOUNTER — Other Ambulatory Visit: Payer: Self-pay | Admitting: Obstetrics and Gynecology

## 2019-12-02 DIAGNOSIS — R102 Pelvic and perineal pain: Secondary | ICD-10-CM

## 2019-12-09 ENCOUNTER — Encounter: Payer: Self-pay | Admitting: Rheumatology

## 2019-12-09 ENCOUNTER — Other Ambulatory Visit: Payer: Self-pay

## 2019-12-09 ENCOUNTER — Ambulatory Visit: Payer: Medicare PPO | Admitting: Rheumatology

## 2019-12-09 VITALS — BP 104/64 | HR 71 | Resp 15 | Ht 63.0 in | Wt 174.6 lb

## 2019-12-09 DIAGNOSIS — M19072 Primary osteoarthritis, left ankle and foot: Secondary | ICD-10-CM

## 2019-12-09 DIAGNOSIS — M19011 Primary osteoarthritis, right shoulder: Secondary | ICD-10-CM

## 2019-12-09 DIAGNOSIS — M170 Bilateral primary osteoarthritis of knee: Secondary | ICD-10-CM | POA: Diagnosis not present

## 2019-12-09 DIAGNOSIS — Z170 Estrogen receptor positive status [ER+]: Secondary | ICD-10-CM

## 2019-12-09 DIAGNOSIS — Z8719 Personal history of other diseases of the digestive system: Secondary | ICD-10-CM

## 2019-12-09 DIAGNOSIS — Z8669 Personal history of other diseases of the nervous system and sense organs: Secondary | ICD-10-CM

## 2019-12-09 DIAGNOSIS — M5030 Other cervical disc degeneration, unspecified cervical region: Secondary | ICD-10-CM | POA: Diagnosis not present

## 2019-12-09 DIAGNOSIS — M19041 Primary osteoarthritis, right hand: Secondary | ICD-10-CM | POA: Diagnosis not present

## 2019-12-09 DIAGNOSIS — N3011 Interstitial cystitis (chronic) with hematuria: Secondary | ICD-10-CM

## 2019-12-09 DIAGNOSIS — M810 Age-related osteoporosis without current pathological fracture: Secondary | ICD-10-CM

## 2019-12-09 DIAGNOSIS — I10 Essential (primary) hypertension: Secondary | ICD-10-CM

## 2019-12-09 DIAGNOSIS — M19071 Primary osteoarthritis, right ankle and foot: Secondary | ICD-10-CM | POA: Diagnosis not present

## 2019-12-09 DIAGNOSIS — C50511 Malignant neoplasm of lower-outer quadrant of right female breast: Secondary | ICD-10-CM

## 2019-12-09 DIAGNOSIS — R7303 Prediabetes: Secondary | ICD-10-CM

## 2019-12-09 DIAGNOSIS — R748 Abnormal levels of other serum enzymes: Secondary | ICD-10-CM

## 2019-12-09 DIAGNOSIS — F419 Anxiety disorder, unspecified: Secondary | ICD-10-CM

## 2019-12-09 DIAGNOSIS — M722 Plantar fascial fibromatosis: Secondary | ICD-10-CM

## 2019-12-09 DIAGNOSIS — F329 Major depressive disorder, single episode, unspecified: Secondary | ICD-10-CM

## 2019-12-09 DIAGNOSIS — M19042 Primary osteoarthritis, left hand: Secondary | ICD-10-CM

## 2019-12-09 DIAGNOSIS — M797 Fibromyalgia: Secondary | ICD-10-CM | POA: Diagnosis not present

## 2019-12-09 NOTE — Patient Instructions (Signed)
Journal for Nurse Practitioners, 15(4), 263-267. Retrieved April 06, 2018 from http://clinicalkey.com/nursing">  Knee Exercises Ask your health care provider which exercises are safe for you. Do exercises exactly as told by your health care provider and adjust them as directed. It is normal to feel mild stretching, pulling, tightness, or discomfort as you do these exercises. Stop right away if you feel sudden pain or your pain gets worse. Do not begin these exercises until told by your health care provider. Stretching and range-of-motion exercises These exercises warm up your muscles and joints and improve the movement and flexibility of your knee. These exercises also help to relieve pain and swelling. Knee extension, prone 1. Lie on your abdomen (prone position) on a bed. 2. Place your left / right knee just beyond the edge of the surface so your knee is not on the bed. You can put a towel under your left / right thigh just above your kneecap for comfort. 3. Relax your leg muscles and allow gravity to straighten your knee (extension). You should feel a stretch behind your left / right knee. 4. Hold this position for __________ seconds. 5. Scoot up so your knee is supported between repetitions. Repeat __________ times. Complete this exercise __________ times a day. Knee flexion, active  1. Lie on your back with both legs straight. If this causes back discomfort, bend your left / right knee so your foot is flat on the floor. 2. Slowly slide your left / right heel back toward your buttocks. Stop when you feel a gentle stretch in the front of your knee or thigh (flexion). 3. Hold this position for __________ seconds. 4. Slowly slide your left / right heel back to the starting position. Repeat __________ times. Complete this exercise __________ times a day. Quadriceps stretch, prone  1. Lie on your abdomen on a firm surface, such as a bed or padded floor. 2. Bend your left / right knee and hold  your ankle. If you cannot reach your ankle or pant leg, loop a belt around your foot and grab the belt instead. 3. Gently pull your heel toward your buttocks. Your knee should not slide out to the side. You should feel a stretch in the front of your thigh and knee (quadriceps). 4. Hold this position for __________ seconds. Repeat __________ times. Complete this exercise __________ times a day. Hamstring, supine 1. Lie on your back (supine position). 2. Loop a belt or towel over the ball of your left / right foot. The ball of your foot is on the walking surface, right under your toes. 3. Straighten your left / right knee and slowly pull on the belt to raise your leg until you feel a gentle stretch behind your knee (hamstring). ? Do not let your knee bend while you do this. ? Keep your other leg flat on the floor. 4. Hold this position for __________ seconds. Repeat __________ times. Complete this exercise __________ times a day. Strengthening exercises These exercises build strength and endurance in your knee. Endurance is the ability to use your muscles for a long time, even after they get tired. Quadriceps, isometric This exercise stretches the muscles in front of your thigh (quadriceps) without moving your knee joint (isometric). 1. Lie on your back with your left / right leg extended and your other knee bent. Put a rolled towel or small pillow under your knee if told by your health care provider. 2. Slowly tense the muscles in the front of your left /   right thigh. You should see your kneecap slide up toward your hip or see increased dimpling just above the knee. This motion will push the back of the knee toward the floor. 3. For __________ seconds, hold the muscle as tight as you can without increasing your pain. 4. Relax the muscles slowly and completely. Repeat __________ times. Complete this exercise __________ times a day. Straight leg raises This exercise stretches the muscles in front  of your thigh (quadriceps) and the muscles that move your hips (hip flexors). 1. Lie on your back with your left / right leg extended and your other knee bent. 2. Tense the muscles in the front of your left / right thigh. You should see your kneecap slide up or see increased dimpling just above the knee. Your thigh may even shake a bit. 3. Keep these muscles tight as you raise your leg 4-6 inches (10-15 cm) off the floor. Do not let your knee bend. 4. Hold this position for __________ seconds. 5. Keep these muscles tense as you lower your leg. 6. Relax your muscles slowly and completely after each repetition. Repeat __________ times. Complete this exercise __________ times a day. Hamstring, isometric 1. Lie on your back on a firm surface. 2. Bend your left / right knee about __________ degrees. 3. Dig your left / right heel into the surface as if you are trying to pull it toward your buttocks. Tighten the muscles in the back of your thighs (hamstring) to "dig" as hard as you can without increasing any pain. 4. Hold this position for __________ seconds. 5. Release the tension gradually and allow your muscles to relax completely for __________ seconds after each repetition. Repeat __________ times. Complete this exercise __________ times a day. Hamstring curls If told by your health care provider, do this exercise while wearing ankle weights. Begin with __________ lb weights. Then increase the weight by 1 lb (0.5 kg) increments. Do not wear ankle weights that are more than __________ lb. 1. Lie on your abdomen with your legs straight. 2. Bend your left / right knee as far as you can without feeling pain. Keep your hips flat against the floor. 3. Hold this position for __________ seconds. 4. Slowly lower your leg to the starting position. Repeat __________ times. Complete this exercise __________ times a day. Squats This exercise strengthens the muscles in front of your thigh and knee  (quadriceps). 1. Stand in front of a table, with your feet and knees pointing straight ahead. You may rest your hands on the table for balance but not for support. 2. Slowly bend your knees and lower your hips like you are going to sit in a chair. ? Keep your weight over your heels, not over your toes. ? Keep your lower legs upright so they are parallel with the table legs. ? Do not let your hips go lower than your knees. ? Do not bend lower than told by your health care provider. ? If your knee pain increases, do not bend as low. 3. Hold the squat position for __________ seconds. 4. Slowly push with your legs to return to standing. Do not use your hands to pull yourself to standing. Repeat __________ times. Complete this exercise __________ times a day. Wall slides This exercise strengthens the muscles in front of your thigh and knee (quadriceps). 1. Lean your back against a smooth wall or door, and walk your feet out 18-24 inches (46-61 cm) from it. 2. Place your feet hip-width apart. 3.   Slowly slide down the wall or door until your knees bend __________ degrees. Keep your knees over your heels, not over your toes. Keep your knees in line with your hips. 4. Hold this position for __________ seconds. Repeat __________ times. Complete this exercise __________ times a day. Straight leg raises This exercise strengthens the muscles that rotate the leg at the hip and move it away from your body (hip abductors). 1. Lie on your side with your left / right leg in the top position. Lie so your head, shoulder, knee, and hip line up. You may bend your bottom knee to help you keep your balance. 2. Roll your hips slightly forward so your hips are stacked directly over each other and your left / right knee is facing forward. 3. Leading with your heel, lift your top leg 4-6 inches (10-15 cm). You should feel the muscles in your outer hip lifting. ? Do not let your foot drift forward. ? Do not let your knee  roll toward the ceiling. 4. Hold this position for __________ seconds. 5. Slowly return your leg to the starting position. 6. Let your muscles relax completely after each repetition. Repeat __________ times. Complete this exercise __________ times a day. Straight leg raises This exercise stretches the muscles that move your hips away from the front of the pelvis (hip extensors). 1. Lie on your abdomen on a firm surface. You can put a pillow under your hips if that is more comfortable. 2. Tense the muscles in your buttocks and lift your left / right leg about 4-6 inches (10-15 cm). Keep your knee straight as you lift your leg. 3. Hold this position for __________ seconds. 4. Slowly lower your leg to the starting position. 5. Let your leg relax completely after each repetition. Repeat __________ times. Complete this exercise __________ times a day. This information is not intended to replace advice given to you by your health care provider. Make sure you discuss any questions you have with your health care provider. Document Revised: 04/07/2018 Document Reviewed: 04/07/2018 Elsevier Patient Education  2020 [employer]  

## 2019-12-14 ENCOUNTER — Encounter: Payer: Medicare PPO | Attending: Family Medicine | Admitting: Dietician

## 2019-12-14 ENCOUNTER — Other Ambulatory Visit: Payer: Self-pay

## 2019-12-14 DIAGNOSIS — E119 Type 2 diabetes mellitus without complications: Secondary | ICD-10-CM | POA: Diagnosis not present

## 2019-12-15 DIAGNOSIS — Q6689 Other  specified congenital deformities of feet: Secondary | ICD-10-CM | POA: Diagnosis not present

## 2019-12-16 ENCOUNTER — Other Ambulatory Visit: Payer: Medicare PPO

## 2019-12-17 ENCOUNTER — Encounter: Payer: Self-pay | Admitting: Dietician

## 2019-12-17 NOTE — Progress Notes (Signed)
Patient was seen on 12/14/2019 for the first of a series of three diabetes self-management courses at the [facility].  Patient Education Plan per assessed needs and concerns is to attend three course education program for Diabetes Self Management Education.  The following learning objectives were met by the patient during this class:  Describe diabetes, types of diabetes and pathophysiology  State some common risk factors for diabetes  Defines the role of glucose and insulin  Describe the relationship between diabetes and cardiovascular and other risks  State the members of the Healthcare Team  States the rationale for glucose monitoring and when to test  State their individual Target Range  State the importance of logging glucose readings and how to interpret the readings  Identifies A1C target  Explain the correlation between A1c and eAG values  State symptoms and treatment of high blood glucose and low blood glucose  Explain proper technique for glucose testing and identify proper sharps disposal  Handouts given during class include:  How to Thrive:  A Guide for Your Journey with Diabetes by the ADA  Meal Plan Card and carbohydrate content list  Dietary intake form  Low Sodium Flavoring Tips  Types of Fats  Dining Out  Label reading  Snack list  Planning a balanced meal  The diabetes portion plate  Diabetes Resources  A1c to eAG Conversion Chart  Blood Glucose Log  Diabetes Recommended Care Schedule  Support Group  Diabetes Success Plan  Core Class Satisfaction Survey   Follow-Up Plan:  Attend core 2   

## 2019-12-21 ENCOUNTER — Other Ambulatory Visit: Payer: Self-pay

## 2019-12-21 ENCOUNTER — Encounter: Payer: Self-pay | Admitting: Dietician

## 2019-12-21 ENCOUNTER — Encounter: Payer: Medicare PPO | Admitting: Dietician

## 2019-12-21 DIAGNOSIS — E119 Type 2 diabetes mellitus without complications: Secondary | ICD-10-CM | POA: Diagnosis not present

## 2019-12-21 NOTE — Progress Notes (Signed)
Patient was seen on 12/21/2019 for the second of a series of three diabetes self-management courses at the [facility]. The following learning objectives were met by the patient during this class:   Describe the role of different macronutrients on glucose  Explain how carbohydrates affect blood glucose  State what foods contain the most carbohydrates  Demonstrate carbohydrate counting  Demonstrate how to read Nutrition Facts food label  Describe effects of various fats on heart health  Describe the importance of good nutrition for health and healthy eating strategies  Describe techniques for managing your shopping, cooking and meal planning  List strategies to follow meal plan when dining out  Describe the effects of alcohol on glucose and how to use it safely  Goals:  Follow Diabetes Meal Plan as instructed  Aim to spread carbs evenly throughout the day  Aim for 3 meals per day and snacks as needed Include lean protein foods to meals/snacks  Monitor glucose levels as instructed by your doctor   Follow-Up Plan:  Attend Core 3  Work towards following your personal food plan.

## 2019-12-22 DIAGNOSIS — M170 Bilateral primary osteoarthritis of knee: Secondary | ICD-10-CM | POA: Diagnosis not present

## 2019-12-22 DIAGNOSIS — M1711 Unilateral primary osteoarthritis, right knee: Secondary | ICD-10-CM | POA: Diagnosis not present

## 2019-12-22 DIAGNOSIS — M1712 Unilateral primary osteoarthritis, left knee: Secondary | ICD-10-CM | POA: Diagnosis not present

## 2019-12-28 ENCOUNTER — Other Ambulatory Visit: Payer: Medicare PPO

## 2019-12-28 DIAGNOSIS — M25571 Pain in right ankle and joints of right foot: Secondary | ICD-10-CM | POA: Diagnosis not present

## 2019-12-28 DIAGNOSIS — M25572 Pain in left ankle and joints of left foot: Secondary | ICD-10-CM | POA: Diagnosis not present

## 2019-12-28 DIAGNOSIS — M25561 Pain in right knee: Secondary | ICD-10-CM | POA: Diagnosis not present

## 2020-01-05 DIAGNOSIS — M1712 Unilateral primary osteoarthritis, left knee: Secondary | ICD-10-CM | POA: Diagnosis not present

## 2020-01-12 ENCOUNTER — Other Ambulatory Visit: Payer: Medicare PPO

## 2020-01-18 ENCOUNTER — Other Ambulatory Visit: Payer: Medicare PPO

## 2020-01-19 DIAGNOSIS — M1712 Unilateral primary osteoarthritis, left knee: Secondary | ICD-10-CM | POA: Diagnosis not present

## 2020-01-25 DIAGNOSIS — M1712 Unilateral primary osteoarthritis, left knee: Secondary | ICD-10-CM | POA: Diagnosis not present

## 2020-01-25 DIAGNOSIS — M25572 Pain in left ankle and joints of left foot: Secondary | ICD-10-CM | POA: Diagnosis not present

## 2020-01-25 DIAGNOSIS — M25571 Pain in right ankle and joints of right foot: Secondary | ICD-10-CM | POA: Diagnosis not present

## 2020-01-26 DIAGNOSIS — M25572 Pain in left ankle and joints of left foot: Secondary | ICD-10-CM | POA: Diagnosis not present

## 2020-01-26 DIAGNOSIS — M79671 Pain in right foot: Secondary | ICD-10-CM | POA: Diagnosis not present

## 2020-01-26 DIAGNOSIS — M1712 Unilateral primary osteoarthritis, left knee: Secondary | ICD-10-CM | POA: Diagnosis not present

## 2020-01-26 DIAGNOSIS — M25571 Pain in right ankle and joints of right foot: Secondary | ICD-10-CM | POA: Diagnosis not present

## 2020-01-26 DIAGNOSIS — Q6689 Other  specified congenital deformities of feet: Secondary | ICD-10-CM | POA: Diagnosis not present

## 2020-01-26 DIAGNOSIS — M67873 Other specified disorders of tendon, right ankle and foot: Secondary | ICD-10-CM | POA: Diagnosis not present

## 2020-01-27 ENCOUNTER — Ambulatory Visit
Admission: RE | Admit: 2020-01-27 | Discharge: 2020-01-27 | Disposition: A | Discharge status: Home / Self Care | Payer: Medicare PPO | Source: Ambulatory Visit | Attending: Obstetrics and Gynecology | Admitting: Obstetrics and Gynecology

## 2020-01-27 DIAGNOSIS — D252 Subserosal leiomyoma of uterus: Secondary | ICD-10-CM | POA: Diagnosis not present

## 2020-01-27 DIAGNOSIS — R102 Pelvic and perineal pain: Secondary | ICD-10-CM

## 2020-01-31 DIAGNOSIS — M25572 Pain in left ankle and joints of left foot: Secondary | ICD-10-CM | POA: Diagnosis not present

## 2020-01-31 DIAGNOSIS — M25571 Pain in right ankle and joints of right foot: Secondary | ICD-10-CM | POA: Diagnosis not present

## 2020-01-31 DIAGNOSIS — M1712 Unilateral primary osteoarthritis, left knee: Secondary | ICD-10-CM | POA: Diagnosis not present

## 2020-02-02 ENCOUNTER — Other Ambulatory Visit: Payer: Self-pay | Admitting: Hematology and Oncology

## 2020-02-02 DIAGNOSIS — Z853 Personal history of malignant neoplasm of breast: Secondary | ICD-10-CM

## 2020-02-02 DIAGNOSIS — Z9889 Other specified postprocedural states: Secondary | ICD-10-CM

## 2020-02-02 DIAGNOSIS — M25571 Pain in right ankle and joints of right foot: Secondary | ICD-10-CM | POA: Diagnosis not present

## 2020-02-02 DIAGNOSIS — N644 Mastodynia: Secondary | ICD-10-CM | POA: Diagnosis not present

## 2020-02-02 DIAGNOSIS — M1712 Unilateral primary osteoarthritis, left knee: Secondary | ICD-10-CM | POA: Diagnosis not present

## 2020-02-02 DIAGNOSIS — M25572 Pain in left ankle and joints of left foot: Secondary | ICD-10-CM | POA: Diagnosis not present

## 2020-02-02 DIAGNOSIS — N3281 Overactive bladder: Secondary | ICD-10-CM | POA: Diagnosis not present

## 2020-02-07 DIAGNOSIS — M1711 Unilateral primary osteoarthritis, right knee: Secondary | ICD-10-CM | POA: Diagnosis not present

## 2020-02-07 DIAGNOSIS — M1712 Unilateral primary osteoarthritis, left knee: Secondary | ICD-10-CM | POA: Diagnosis not present

## 2020-02-07 DIAGNOSIS — M170 Bilateral primary osteoarthritis of knee: Secondary | ICD-10-CM | POA: Diagnosis not present

## 2020-02-07 DIAGNOSIS — M25572 Pain in left ankle and joints of left foot: Secondary | ICD-10-CM | POA: Diagnosis not present

## 2020-02-07 DIAGNOSIS — M25561 Pain in right knee: Secondary | ICD-10-CM | POA: Diagnosis not present

## 2020-02-07 DIAGNOSIS — M25562 Pain in left knee: Secondary | ICD-10-CM | POA: Diagnosis not present

## 2020-02-07 DIAGNOSIS — M25571 Pain in right ankle and joints of right foot: Secondary | ICD-10-CM | POA: Diagnosis not present

## 2020-02-09 DIAGNOSIS — M25572 Pain in left ankle and joints of left foot: Secondary | ICD-10-CM | POA: Diagnosis not present

## 2020-02-09 DIAGNOSIS — M1712 Unilateral primary osteoarthritis, left knee: Secondary | ICD-10-CM | POA: Diagnosis not present

## 2020-02-09 DIAGNOSIS — M25571 Pain in right ankle and joints of right foot: Secondary | ICD-10-CM | POA: Diagnosis not present

## 2020-02-16 DIAGNOSIS — M1712 Unilateral primary osteoarthritis, left knee: Secondary | ICD-10-CM | POA: Diagnosis not present

## 2020-02-16 DIAGNOSIS — M25571 Pain in right ankle and joints of right foot: Secondary | ICD-10-CM | POA: Diagnosis not present

## 2020-02-16 DIAGNOSIS — M25572 Pain in left ankle and joints of left foot: Secondary | ICD-10-CM | POA: Diagnosis not present

## 2020-02-18 DIAGNOSIS — M1712 Unilateral primary osteoarthritis, left knee: Secondary | ICD-10-CM | POA: Diagnosis not present

## 2020-02-18 DIAGNOSIS — M25572 Pain in left ankle and joints of left foot: Secondary | ICD-10-CM | POA: Diagnosis not present

## 2020-02-18 DIAGNOSIS — M25571 Pain in right ankle and joints of right foot: Secondary | ICD-10-CM | POA: Diagnosis not present

## 2020-02-22 DIAGNOSIS — M25572 Pain in left ankle and joints of left foot: Secondary | ICD-10-CM | POA: Diagnosis not present

## 2020-02-22 DIAGNOSIS — M1712 Unilateral primary osteoarthritis, left knee: Secondary | ICD-10-CM | POA: Diagnosis not present

## 2020-02-22 DIAGNOSIS — M25571 Pain in right ankle and joints of right foot: Secondary | ICD-10-CM | POA: Diagnosis not present

## 2020-02-25 ENCOUNTER — Other Ambulatory Visit: Payer: Self-pay

## 2020-02-25 ENCOUNTER — Ambulatory Visit
Admission: RE | Admit: 2020-02-25 | Discharge: 2020-02-25 | Disposition: A | Discharge status: Home / Self Care | Payer: Medicare PPO | Source: Ambulatory Visit | Attending: Hematology and Oncology | Admitting: Hematology and Oncology

## 2020-02-25 DIAGNOSIS — Z853 Personal history of malignant neoplasm of breast: Secondary | ICD-10-CM | POA: Diagnosis not present

## 2020-02-25 DIAGNOSIS — Z803 Family history of malignant neoplasm of breast: Secondary | ICD-10-CM | POA: Diagnosis not present

## 2020-02-25 DIAGNOSIS — Z9889 Other specified postprocedural states: Secondary | ICD-10-CM

## 2020-02-28 NOTE — Progress Notes (Signed)
Patient Care Team: [name], MD as PCP - General [name], MD as Consulting Physician (General Surgery) Kyung Rudd, MD as Consulting Physician (Radiation Oncology) Sylvan Cheese, NP as Nurse Practitioner (Hematology and Oncology) Nicholas Lose, MD as Consulting Physician (Hematology and Oncology)  DIAGNOSIS:    ICD-10-CM   1. Malignant neoplasm of lower-outer quadrant of right breast of female, estrogen receptor positive ([place])  C50.511    Z17.0     SUMMARY OF ONCOLOGIC HISTORY: Oncology History Overview Note  Breast cancer of lower-outer quadrant of right female breast   Staging form: Breast, AJCC 7th Edition     Clinical: Stage IA (T1b, N0, M0) - Unsigned     Pathologic stage from 02/13/2015: Stage IB (T1b, N41mi, cM0) - Unsigned      Breast cancer of lower-outer quadrant of right female breast ([place])  01/05/2015 Mammogram   Right breast: possible mass warranting further evaluation   01/05/2015 Breast MRI   10 mm diameter enhancing mass in the lower outer right breast suspicious for malignancy   01/18/2015 Mammogram   Diagnostic mammo and ultrasound showed a 0.6 cm mass in the right breast 8:00 location 6 cm from the nipple, and a 0.5 cm lesion in the 10:00 location of the right breast 6 cm from the nipple. Right axilla was negative for adenopathy.   01/19/2015 Initial Biopsy   Right breast 8:00 mass biopsy showed invasive ductal carcinoma and DCIS, grade 1-2, the 10:00 mass biopsy showed fibrocystic change.ER 100% positive, PR 90% positive, HER-2 negative, Ki-67 10%   01/19/2015 Clinical Stage   Stage IA: T1b N0   02/13/2015 Definitive Surgery   Right breast lumpectomy: margins negative; Invasive ductal carcinoma, G1, 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 1 out of 2 lymph nodes were positive for micrometastasis.   02/13/2015 Oncotype testing   RS 12 (8% ROR)   02/13/2015 Pathologic Stage   Stage IB: T1b N1(mi)   03/10/2015 Procedure   Breast/Ovarian  Panel (Gene Dx): no clinically significant variant at ATM, BARD1, BRCA1, BRCA2, BRIP1, CDH1, CHEK2, FANCC, MLH1, MSH2, MSH6, NBN, PALB2, PMS2, PTEN, RAD51C, RAD51D, TP53, and XRCC2.    03/20/2015 - 04/14/2015 Radiation Therapy   Adjuvant XRT [facility]): 42.5 Gy over 17 fractions to the breast using whole-breast tangent fields. Right breast boost 7.5 Gy over 3 fractions. Total dose: 50 Gy   04/19/2015 -  Anti-estrogen oral therapy   Anastrozole 1 mg daily. Planned duration of therapy 5-10 years.   07/05/2015 Survivorship   Survivorship care plan completed and mailed to patient in lieu of in person visit.   11/15/2015 Breast MRI   No evidence of breast malignancy. Anterior and lateral skin thickening consistent with radiation change     CHIEF COMPLIANT: Follow-upofrightbreast cancer on anastrozole therapy  INTERVAL HISTORY: Shannon Hancock is a 69 y.o. with above-mentioned history of right breast cancer treated with lumpectomy,radiation,and whois currently on anastrozole.Breast MRI on 11/10/19 showed no evidence of malignancy bilaterally. Mammogram on 02/25/20 showed no evidence of malignancy bilaterally. She presents to the clinic todayfor follow-up.   ALLERGIES:  is allergic to olive oil and oxycodone.  MEDICATIONS:  Current Outpatient Medications  Medication Sig Dispense Refill  . acetaminophen (TYLENOL) 325 MG tablet Take 650 mg by mouth every 6 (six) hours as needed.    Marland Kitchen alendronate (FOSAMAX) 70 MG tablet Take 1 tablet (70 mg total) by mouth once a week. Take with a full glass of water on an empty stomach. 12 tablet 3  .  ALPRAZolam (XANAX) 1 MG tablet TAKE 1 TABLET BY MOUTH 4 TIMES A DAY AS NEEDED FOR SLEEP OR ANXIETY 120 tablet 0  . amLODipine (NORVASC) 5 MG tablet Take 5 mg by mouth daily.    Marland Kitchen anastrozole (ARIMIDEX) 1 MG tablet Take 1 tablet (1 mg total) by mouth daily. 90 tablet 3  . budesonide-formoterol (SYMBICORT) 80-4.5 MCG/ACT inhaler Inhale 2 puffs into the lungs 2  (two) times daily.    . Cholecalciferol (VITAMIN D) 125 MCG (5000 UT) CAPS Take 1 capsule by mouth daily.    . fexofenadine (ALLEGRA) 180 MG tablet Take 180 mg by mouth daily.    . fluticasone (FLONASE) 50 MCG/ACT nasal spray Place into both nostrils as needed for allergies or rhinitis.    . Lactobacillus (PROBIOTIC ACIDOPHILUS PO) Take by mouth.    . losartan-hydrochlorothiazide (HYZAAR) 100-12.5 MG per tablet TAKE 1 TABLET BY MOUTH DAILY. 30 tablet 0  . LYRICA 100 MG capsule TAKE ONE CAPSULE TWICE A DAY 60 capsule 3  . magnesium oxide (MAG-OX) 400 MG tablet Take 400 mg by mouth daily.    . meloxicam (MOBIC) 7.5 MG tablet Take 1 tablet (7.5 mg total) by mouth daily. Reported on 11/15/2015 (Patient taking differently: Take 7.5 mg by mouth 2 (two) times daily. )    . methocarbamol (ROBAXIN) 500 MG tablet 2 (two) times daily as needed.    . metoprolol succinate (TOPROL-XL) 25 MG 24 hr tablet     . omeprazole (PRILOSEC) 40 MG capsule omeprazole 40 mg capsule,delayed release  1 CAPSULE 30 MINUTES BEFORE MORNING MEAL ONCE A DAY ORALLY 90 DAYS    . psyllium (METAMUCIL) 0.52 g capsule Take 1 capsule (0.52 g total) by mouth daily.    . solifenacin (VESICARE) 5 MG tablet solifenacin 5 mg tablet  TAKE 1 TABLET BY MOUTH EVERY DAY    . ZETIA 10 MG tablet TAKE 1 TABLET BY MOUTH DAILY. 90 tablet 1   No current facility-administered medications for this visit.    PHYSICAL EXAMINATION: ECOG PERFORMANCE STATUS: 1 - Symptomatic but completely ambulatory  There were no vitals filed for this visit. There were no vitals filed for this visit.  BREAST: No palpable masses or nodules in either right or left breasts. No palpable axillary supraclavicular or infraclavicular adenopathy no breast tenderness or nipple discharge. (exam performed in the presence of a chaperone)  LABORATORY DATA:  I have reviewed the data as listed CMP Latest Ref Rng & Units 07/27/2019 03/03/2019 11/28/2015  Glucose 65 - 99 mg/dL 79 - -    BUN 8 - 27 mg/dL 18 - -  Creatinine 0.57 - 1.00 mg/dL 0.97 1.00 -  Sodium 134 - 144 mmol/L 141 - -  Potassium 3.5 - 5.2 mmol/L 4.4 - -  Chloride 96 - 106 mmol/L 101 - -  CO2 20 - 29 mmol/L 24 - -  Calcium 8.7 - 10.3 mg/dL 10.1 - -  Total Protein 6.0 - 8.5 g/dL 6.9 - 7.4  Total Bilirubin 0.0 - 1.2 mg/dL 0.2 - 0.5  Alkaline Phos 39 - 117 IU/L 83 - 73  AST 0 - 40 IU/L 23 - 31  ALT 0 - 32 IU/L 24 - 21    Lab Results  Component Value Date   WBC 6.4 07/27/2019   HGB 11.6 07/27/2019   HCT 36.7 07/27/2019   MCV 83 07/27/2019   PLT 208 07/27/2019   NEUTROABS 3.4 07/27/2019    ASSESSMENT & PLAN:  Breast cancer of lower-outer quadrant  of right female breast Right breast lumpectomy 02/13/2015: margins negative; Invasive ductal carcinoma, G1, 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 1 out of 2 lymph nodes were positive for micrometastasis.Oncotype 12 (8% ROR) T1b N1 mic stage IB XRT from 03/20/15 to 04/14/2015  Current treatment: Anastrozole 1 mg daily started November 2016switched to letrozole 03/01/2019 Letrozole toxicities: 1.Diffuse arthralgias and myalgias: Doing better since she got injections in her knees 2.Osteopenia: Bone density 04/23/2018: T score -1.5: Continue with calcium and vitamin D and Fosamax and weightbearing exercises.  We will obtain a new bone density test in October of this year 3.Weight gain: Monitoring  Breast cancer surveillance: 1.Breast exam 10/11/2019: Benign 2.Mammogram 02/25/2020: Benign breast density category C 3.Breast MRI 11/10/2019: No evidence of malignancy, breast density category C  Continue with annual breast MRIs in addition to annual mammograms  Right breast tenderness: Encouraged her to exercise and do stretching Return to clinic in 1 year for follow-up     No orders of the defined types were placed in this encounter.  The patient has a good understanding of the overall plan. she agrees with it. she will call with any  problems that may develop before the next visit here.  Total time spent: 30 mins including face to face time and time spent for planning, charting and coordination of care  Nicholas Lose, MD 02/29/2020  I, Cloyde Reams Dorshimer, am acting as scribe for Dr. Nicholas Lose.  I have reviewed the above documentation for accuracy and completeness, and I agree with the above.

## 2020-02-29 ENCOUNTER — Other Ambulatory Visit: Payer: Self-pay

## 2020-02-29 ENCOUNTER — Inpatient Hospital Stay: Payer: Medicare PPO | Attending: Hematology and Oncology | Admitting: Hematology and Oncology

## 2020-02-29 VITALS — BP 133/66 | HR 73 | Temp 97.6°F | Resp 18 | Ht 63.0 in | Wt 173.4 lb

## 2020-02-29 DIAGNOSIS — Z79899 Other long term (current) drug therapy: Secondary | ICD-10-CM | POA: Insufficient documentation

## 2020-02-29 DIAGNOSIS — C50511 Malignant neoplasm of lower-outer quadrant of right female breast: Secondary | ICD-10-CM | POA: Diagnosis not present

## 2020-02-29 DIAGNOSIS — Z170 Estrogen receptor positive status [ER+]: Secondary | ICD-10-CM | POA: Diagnosis not present

## 2020-02-29 DIAGNOSIS — M791 Myalgia, unspecified site: Secondary | ICD-10-CM | POA: Insufficient documentation

## 2020-02-29 DIAGNOSIS — Z885 Allergy status to narcotic agent status: Secondary | ICD-10-CM | POA: Insufficient documentation

## 2020-02-29 DIAGNOSIS — Z79811 Long term (current) use of aromatase inhibitors: Secondary | ICD-10-CM | POA: Diagnosis not present

## 2020-02-29 DIAGNOSIS — M85859 Other specified disorders of bone density and structure, unspecified thigh: Secondary | ICD-10-CM

## 2020-02-29 DIAGNOSIS — T451X5A Adverse effect of antineoplastic and immunosuppressive drugs, initial encounter: Secondary | ICD-10-CM | POA: Diagnosis not present

## 2020-02-29 DIAGNOSIS — M2550 Pain in unspecified joint: Secondary | ICD-10-CM | POA: Diagnosis not present

## 2020-02-29 DIAGNOSIS — M8580 Other specified disorders of bone density and structure, unspecified site: Secondary | ICD-10-CM | POA: Diagnosis not present

## 2020-02-29 NOTE — Assessment & Plan Note (Signed)
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 1 out of 2 lymph nodes were positive for micrometastasis.Oncotype 12 (8% ROR) T1b N1 mic stage IB XRT from 03/20/15 to 04/14/2015  Current treatment: Anastrozole 1 mg daily started November 2016switched to letrozole 03/01/2019 Letrozole toxicities: 1.Diffuse arthralgias and myalgias: I discussed with her about different over-the-counter options including turmeric, CBD oils, magnesium, topical measures like Voltaren gel etc. We will also refer her to see Dr.Deveshwar. 2.Osteopenia: Bone density 04/23/2018: T score -1.5: Continue with calcium and vitamin D and weightbearing exercises 3.Weight gain: Monitoring  Breast cancer surveillance: 1.Breast exam 10/11/2019: Benign 2.Mammogram 02/25/2020: Benign breast density category C 3.Breast MRI 11/10/2019: No evidence of malignancy, breast density category C  Continue with annual breast MRIs in addition to annual mammograms  Right breast tenderness:  Return to clinic in 1 year for follow-up

## 2020-03-01 ENCOUNTER — Telehealth: Payer: Self-pay | Admitting: *Deleted

## 2020-03-01 ENCOUNTER — Telehealth: Payer: Self-pay | Admitting: Hematology and Oncology

## 2020-03-01 DIAGNOSIS — M25572 Pain in left ankle and joints of left foot: Secondary | ICD-10-CM | POA: Diagnosis not present

## 2020-03-01 DIAGNOSIS — M1712 Unilateral primary osteoarthritis, left knee: Secondary | ICD-10-CM | POA: Diagnosis not present

## 2020-03-01 DIAGNOSIS — M25571 Pain in right ankle and joints of right foot: Secondary | ICD-10-CM | POA: Diagnosis not present

## 2020-03-01 NOTE — Telephone Encounter (Signed)
Ordered BCI per D.r Gudena. Faxed requisition to pathology and [facility]. 

## 2020-03-01 NOTE — Telephone Encounter (Signed)
Scheduled per 8/31 los. Called and spoke with pt, pt requested an appt letter and calendar.

## 2020-03-03 DIAGNOSIS — M25571 Pain in right ankle and joints of right foot: Secondary | ICD-10-CM | POA: Diagnosis not present

## 2020-03-03 DIAGNOSIS — M1712 Unilateral primary osteoarthritis, left knee: Secondary | ICD-10-CM | POA: Diagnosis not present

## 2020-03-03 DIAGNOSIS — M25572 Pain in left ankle and joints of left foot: Secondary | ICD-10-CM | POA: Diagnosis not present

## 2020-03-08 DIAGNOSIS — M1711 Unilateral primary osteoarthritis, right knee: Secondary | ICD-10-CM | POA: Diagnosis not present

## 2020-03-14 ENCOUNTER — Ambulatory Visit: Payer: Medicare PPO | Attending: Internal Medicine

## 2020-03-14 DIAGNOSIS — Z23 Encounter for immunization: Secondary | ICD-10-CM

## 2020-03-14 NOTE — Progress Notes (Signed)
   Covid-19 Vaccination Clinic  Name:  LEAHNA HEWSON    MRN: [id number] DOB: 1950/11/28  03/14/2020  Ms. Cunning was observed post Covid-19 immunization for 15 minutes without incident. She was provided with Vaccine Information Sheet and instruction to access the V-Safe system.   Ms. Mckendree was instructed to call 911 with any severe reactions post vaccine: Marland Kitchen Difficulty breathing  . Swelling of face and throat  . A fast heartbeat  . A bad rash all over body  . Dizziness and weakness

## 2020-03-16 DIAGNOSIS — R8271 Bacteriuria: Secondary | ICD-10-CM | POA: Diagnosis not present

## 2020-03-16 DIAGNOSIS — R102 Pelvic and perineal pain: Secondary | ICD-10-CM | POA: Diagnosis not present

## 2020-03-16 DIAGNOSIS — N952 Postmenopausal atrophic vaginitis: Secondary | ICD-10-CM | POA: Diagnosis not present

## 2020-03-16 DIAGNOSIS — N3000 Acute cystitis without hematuria: Secondary | ICD-10-CM | POA: Diagnosis not present

## 2020-03-20 DIAGNOSIS — M25572 Pain in left ankle and joints of left foot: Secondary | ICD-10-CM | POA: Diagnosis not present

## 2020-03-20 DIAGNOSIS — M25571 Pain in right ankle and joints of right foot: Secondary | ICD-10-CM | POA: Diagnosis not present

## 2020-03-20 DIAGNOSIS — M1712 Unilateral primary osteoarthritis, left knee: Secondary | ICD-10-CM | POA: Diagnosis not present

## 2020-03-20 DIAGNOSIS — C50511 Malignant neoplasm of lower-outer quadrant of right female breast: Secondary | ICD-10-CM | POA: Diagnosis not present

## 2020-03-20 DIAGNOSIS — M170 Bilateral primary osteoarthritis of knee: Secondary | ICD-10-CM | POA: Diagnosis not present

## 2020-03-20 DIAGNOSIS — Z170 Estrogen receptor positive status [ER+]: Secondary | ICD-10-CM | POA: Diagnosis not present

## 2020-03-21 ENCOUNTER — Encounter (HOSPITAL_COMMUNITY): Payer: Self-pay | Admitting: Hematology and Oncology

## 2020-03-21 ENCOUNTER — Telehealth: Payer: Self-pay | Admitting: Hematology and Oncology

## 2020-03-21 NOTE — Telephone Encounter (Signed)
I informed her that the breast cancer index revealed that she does not need to stay on antiestrogen therapy for more than 5 years. She will stop anastrozole in November 2021. Her risk of distant recurrence is 1.2%.

## 2020-03-22 DIAGNOSIS — M25571 Pain in right ankle and joints of right foot: Secondary | ICD-10-CM | POA: Diagnosis not present

## 2020-03-22 DIAGNOSIS — M1712 Unilateral primary osteoarthritis, left knee: Secondary | ICD-10-CM | POA: Diagnosis not present

## 2020-03-22 DIAGNOSIS — M25572 Pain in left ankle and joints of left foot: Secondary | ICD-10-CM | POA: Diagnosis not present

## 2020-03-23 DIAGNOSIS — E785 Hyperlipidemia, unspecified: Secondary | ICD-10-CM | POA: Diagnosis not present

## 2020-03-23 DIAGNOSIS — E559 Vitamin D deficiency, unspecified: Secondary | ICD-10-CM | POA: Diagnosis not present

## 2020-03-23 DIAGNOSIS — H918X3 Other specified hearing loss, bilateral: Secondary | ICD-10-CM | POA: Diagnosis not present

## 2020-03-23 DIAGNOSIS — I10 Essential (primary) hypertension: Secondary | ICD-10-CM | POA: Diagnosis not present

## 2020-03-23 DIAGNOSIS — M797 Fibromyalgia: Secondary | ICD-10-CM | POA: Diagnosis not present

## 2020-03-23 DIAGNOSIS — C50911 Malignant neoplasm of unspecified site of right female breast: Secondary | ICD-10-CM | POA: Diagnosis not present

## 2020-03-23 DIAGNOSIS — K219 Gastro-esophageal reflux disease without esophagitis: Secondary | ICD-10-CM | POA: Diagnosis not present

## 2020-03-23 DIAGNOSIS — F419 Anxiety disorder, unspecified: Secondary | ICD-10-CM | POA: Diagnosis not present

## 2020-03-23 DIAGNOSIS — E1169 Type 2 diabetes mellitus with other specified complication: Secondary | ICD-10-CM | POA: Diagnosis not present

## 2020-03-27 DIAGNOSIS — M65861 Other synovitis and tenosynovitis, right lower leg: Secondary | ICD-10-CM | POA: Diagnosis not present

## 2020-03-27 DIAGNOSIS — M65862 Other synovitis and tenosynovitis, left lower leg: Secondary | ICD-10-CM | POA: Diagnosis not present

## 2020-03-27 DIAGNOSIS — M1712 Unilateral primary osteoarthritis, left knee: Secondary | ICD-10-CM | POA: Diagnosis not present

## 2020-03-27 DIAGNOSIS — M25572 Pain in left ankle and joints of left foot: Secondary | ICD-10-CM | POA: Diagnosis not present

## 2020-03-27 DIAGNOSIS — M25571 Pain in right ankle and joints of right foot: Secondary | ICD-10-CM | POA: Diagnosis not present

## 2020-03-28 ENCOUNTER — Encounter: Payer: Self-pay | Admitting: Hematology and Oncology

## 2020-04-03 DIAGNOSIS — M1712 Unilateral primary osteoarthritis, left knee: Secondary | ICD-10-CM | POA: Diagnosis not present

## 2020-04-03 DIAGNOSIS — M25571 Pain in right ankle and joints of right foot: Secondary | ICD-10-CM | POA: Diagnosis not present

## 2020-04-03 DIAGNOSIS — M25572 Pain in left ankle and joints of left foot: Secondary | ICD-10-CM | POA: Diagnosis not present

## 2020-04-05 DIAGNOSIS — M25571 Pain in right ankle and joints of right foot: Secondary | ICD-10-CM | POA: Diagnosis not present

## 2020-04-05 DIAGNOSIS — M25572 Pain in left ankle and joints of left foot: Secondary | ICD-10-CM | POA: Diagnosis not present

## 2020-04-05 DIAGNOSIS — D72829 Elevated white blood cell count, unspecified: Secondary | ICD-10-CM | POA: Diagnosis not present

## 2020-04-05 DIAGNOSIS — M1712 Unilateral primary osteoarthritis, left knee: Secondary | ICD-10-CM | POA: Diagnosis not present

## 2020-04-07 ENCOUNTER — Telehealth: Payer: Self-pay | Admitting: Hematology and Oncology

## 2020-04-07 NOTE — Telephone Encounter (Signed)
Scheduled appt per patient request. Confirmed with provider that it was ok. Confirmed appt with patient.

## 2020-04-08 DIAGNOSIS — R102 Pelvic and perineal pain: Secondary | ICD-10-CM | POA: Diagnosis not present

## 2020-04-08 DIAGNOSIS — R300 Dysuria: Secondary | ICD-10-CM | POA: Diagnosis not present

## 2020-04-09 NOTE — Progress Notes (Signed)
Patient Care Team: [name], MD as PCP - General [name], MD as Consulting Physician (General Surgery) Kyung Rudd, MD as Consulting Physician (Radiation Oncology) Sylvan Cheese, NP as Nurse Practitioner (Hematology and Oncology) Nicholas Lose, MD as Consulting Physician (Hematology and Oncology)  DIAGNOSIS:    ICD-10-CM   1. Malignant neoplasm of lower-outer quadrant of right breast of female, estrogen receptor positive ([place])  C50.511 CT ABDOMEN PELVIS W CONTRAST   Z17.0     SUMMARY OF ONCOLOGIC HISTORY: Oncology History Overview Note  Breast cancer of lower-outer quadrant of right female breast   Staging form: Breast, AJCC 7th Edition     Clinical: Stage IA (T1b, N0, M0) - Unsigned     Pathologic stage from 02/13/2015: Stage IB (T1b, N62m, cM0) - Unsigned      Breast cancer of lower-outer quadrant of right female breast ([place]  01/05/2015 Mammogram   Right breast: possible mass warranting further evaluation   01/05/2015 Breast MRI   10 mm diameter enhancing mass in the lower outer right breast suspicious for malignancy   01/18/2015 Mammogram   Diagnostic mammo and ultrasound showed a 0.6 cm mass in the right breast 8:00 location 6 cm from the nipple, and a 0.5 cm lesion in the 10:00 location of the right breast 6 cm from the nipple. Right axilla was negative for adenopathy.   01/19/2015 Initial Biopsy   Right breast 8:00 mass biopsy showed invasive ductal carcinoma and DCIS, grade 1-2, the 10:00 mass biopsy showed fibrocystic change.ER 100% positive, PR 90% positive, HER-2 negative, Ki-67 10%   01/19/2015 Clinical Stage   Stage IA: T1b N0   02/13/2015 Definitive Surgery   Right breast lumpectomy: margins negative; Invasive ductal carcinoma, G1, 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 1 out of 2 lymph nodes were positive for micrometastasis.   02/13/2015 Oncotype testing   RS 12 (8% ROR)   02/13/2015 Pathologic Stage   Stage IB: T1b N1(mi)   03/10/2015  Procedure   Breast/Ovarian Panel (Gene Dx): no clinically significant variant at ATM, BARD1, BRCA1, BRCA2, BRIP1, CDH1, CHEK2, FANCC, MLH1, MSH2, MSH6, NBN, PALB2, PMS2, PTEN, RAD51C, RAD51D, TP53, and XRCC2.    03/20/2015 - 04/14/2015 Radiation Therapy   Adjuvant XRT ([facility]: 42.5 Gy over 17 fractions to the breast using whole-breast tangent fields. Right breast boost 7.5 Gy over 3 fractions. Total dose: 50 Gy   04/19/2015 -  Anti-estrogen oral therapy   Anastrozole 1 mg daily. Planned duration of therapy 5-10 years. Breast cancer index (03/20/20) revealed chance of distant recurrence is 1.2%, no benefit of continued antiestrogen therapy.   07/05/2015 Survivorship   Survivorship care plan completed and mailed to patient in lieu of in person visit.   11/15/2015 Breast MRI   No evidence of breast malignancy. Anterior and lateral skin thickening consistent with radiation change     CHIEF COMPLIANT: Follow-upofrightbreast cancer and leukocytosis  INTERVAL HISTORY: Shannon LAUGHMANis a 69y.o. with above-mentioned history of right breast cancer treated with lumpectomy,radiation,and whois currently on anastrozole. Breast cancer index revealed her chance of distant recurrence is 1.2%, with no benefit of continued antiestrogen therapy.  She is here primarily to discuss the recent lab findings of elevated leukocyte count.  WBC count was elevated to 14 in September and on repeat testing in October it went up to 16.  There were primarily neutrophils and a slight increase in lymphocytes as well.  Prior to all of this she has been experiencing urinary tract infection  symptoms.  She has been on 2 courses of antibiotics.  She continues to feel bilateral groin discomfort.  She underwent a pelvic ultrasound with her gynecologist and was told there was nothing wrong.  She has also undergone bilateral knee injections for joint swelling and inflammation.  Apart from this she had third dose of moderna  vaccine.   ALLERGIES:  is allergic to olive oil and oxycodone.  MEDICATIONS:  Current Outpatient Medications  Medication Sig Dispense Refill  . acetaminophen (TYLENOL) 325 MG tablet Take 650 mg by mouth every 6 (six) hours as needed.    Marland Kitchen alendronate (FOSAMAX) 70 MG tablet Take 1 tablet (70 mg total) by mouth once a week. Take with a full glass of water on an empty stomach. 12 tablet 3  . ALPRAZolam (XANAX) 1 MG tablet TAKE 1 TABLET BY MOUTH 4 TIMES A DAY AS NEEDED FOR SLEEP OR ANXIETY 120 tablet 0  . amLODipine (NORVASC) 5 MG tablet Take 5 mg by mouth daily.    Marland Kitchen anastrozole (ARIMIDEX) 1 MG tablet Take 1 tablet (1 mg total) by mouth daily. 90 tablet 3  . budesonide-formoterol (SYMBICORT) 80-4.5 MCG/ACT inhaler Inhale 2 puffs into the lungs 2 (two) times daily.    . Cholecalciferol (VITAMIN D) 125 MCG (5000 UT) CAPS Take 1 capsule by mouth daily.    . fexofenadine (ALLEGRA) 180 MG tablet Take 180 mg by mouth daily.    . fluticasone (FLONASE) 50 MCG/ACT nasal spray Place into both nostrils as needed for allergies or rhinitis.    . Lactobacillus (PROBIOTIC ACIDOPHILUS PO) Take by mouth.    . losartan-hydrochlorothiazide (HYZAAR) 100-12.5 MG per tablet TAKE 1 TABLET BY MOUTH DAILY. 30 tablet 0  . LYRICA 100 MG capsule TAKE ONE CAPSULE TWICE A DAY 60 capsule 3  . magnesium oxide (MAG-OX) 400 MG tablet Take 400 mg by mouth daily.    . meloxicam (MOBIC) 7.5 MG tablet Take 1 tablet (7.5 mg total) by mouth daily. Reported on 11/15/2015 (Patient taking differently: Take 7.5 mg by mouth 2 (two) times daily. )    . methocarbamol (ROBAXIN) 500 MG tablet 2 (two) times daily as needed.    . metoprolol succinate (TOPROL-XL) 25 MG 24 hr tablet     . omeprazole (PRILOSEC) 40 MG capsule omeprazole 40 mg capsule,delayed release  1 CAPSULE 30 MINUTES BEFORE MORNING MEAL ONCE A DAY ORALLY 90 DAYS    . psyllium (METAMUCIL) 0.52 g capsule Take 1 capsule (0.52 g total) by mouth daily.    . solifenacin (VESICARE) 5  MG tablet solifenacin 5 mg tablet  TAKE 1 TABLET BY MOUTH EVERY DAY    . ZETIA 10 MG tablet TAKE 1 TABLET BY MOUTH DAILY. 90 tablet 1   No current facility-administered medications for this visit.    PHYSICAL EXAMINATION: ECOG PERFORMANCE STATUS: 1 - Symptomatic but completely ambulatory  Vitals:   04/10/20 0901  BP: (!) 143/60  Pulse: 77  Resp: 17  Temp: (!) 97.2 F (36.2 C)  SpO2: 99%   Filed Weights   04/10/20 0901  Weight: 169 lb 1.6 oz (76.7 kg)     LABORATORY DATA:  I have reviewed the data as listed CMP Latest Ref Rng & Units 07/27/2019 03/03/2019 11/28/2015  Glucose 65 - 99 mg/dL 79 - -  BUN 8 - 27 mg/dL 18 - -  Creatinine 0.57 - 1.00 mg/dL 0.97 1.00 -  Sodium 134 - 144 mmol/L 141 - -  Potassium 3.5 - 5.2 mmol/L 4.4 - -  Chloride 96 - 106 mmol/L 101 - -  CO2 20 - 29 mmol/L 24 - -  Calcium 8.7 - 10.3 mg/dL 10.1 - -  Total Protein 6.0 - 8.5 g/dL 6.9 - 7.4  Total Bilirubin 0.0 - 1.2 mg/dL 0.2 - 0.5  Alkaline Phos 39 - 117 IU/L 83 - 73  AST 0 - 40 IU/L 23 - 31  ALT 0 - 32 IU/L 24 - 21    Lab Results  Component Value Date   WBC 6.4 07/27/2019   HGB 11.6 07/27/2019   HCT 36.7 07/27/2019   MCV 83 07/27/2019   PLT 208 07/27/2019   NEUTROABS 3.4 07/27/2019    ASSESSMENT & PLAN:  Breast cancer of lower-outer quadrant of right female breast Right breast lumpectomy 02/13/2015: margins negative; Invasive ductal carcinoma, G1, 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 1 out of 2 lymph nodes were positive for micrometastasis.Oncotype 12 (8% ROR) T1b N1 mic stage IB XRT from 03/20/15 to 04/14/2015  Current treatment: Anastrozole 1 mg daily started November 2016switched to letrozole 03/01/2019 Letrozole toxicities: 1.Diffuse arthralgias and myalgias: Doing better since she got injections in her knees 2.Osteopenia: Bone density 04/23/2018: T score -1.5: Continue with calcium and vitamin D and Fosamax and weightbearing exercises.  We will obtain a new bone density  test in October of this year 3.Weight gain:Monitoring  Breast cancer index 03/20/2020: No benefit from extended adjuvant therapy, risk of distant recurrence 1.2% Based on these results I recommended discontinuation of antiestrogen therapy having completed 5 years.  Breast cancer surveillance: 1.Breast exam4/05/2020: Benign 2.Mammogram 02/25/2020: Benign breast density category C 3.Breast MRI 11/10/2019: No evidence of malignancy, breast density category C   Leukocytosis: Primarily elevated neutrophils.  I discussed with her that these indicate either infection or inflammation. There was a slight elevation in lymphocytes which is most likely related to her Covid booster vaccine. I recommended obtaining a abdominal and pelvic CT scan to evaluate the cause of her groin symptoms and recurrent UTIs. Recurrent UTIs: Recently started another round of antibiotics. Yesterday she had a urgent care visit at [facility] where the UA was apparently negative.  We will request for the CT scan and I will talk to her after the scan to discuss the results.    Orders Placed This Encounter  Procedures  . CT ABDOMEN PELVIS W CONTRAST    Standing Status:   Future    Standing Expiration Date:   04/10/2021    Order Specific Question:   If indicated for the ordered procedure, I authorize the administration of contrast media per Radiology protocol    Answer:   Yes    Order Specific Question:   Preferred imaging location?    Answer:   [facility]    Order Specific Question:   Release to patient    Answer:   Immediate    Order Specific Question:   Is Oral Contrast requested for this exam?    Answer:   Yes, Per Radiology protocol    Order Specific Question:   Radiology Contrast Protocol - do NOT remove file path    Answer:   [email or web address]   The patient has a good understanding of the overall plan. she agrees with it. she will call with any problems  that may develop before the next visit here.  Total time spent: 30 mins including face to face time and time spent for planning, charting and coordination of care  Nicholas Lose, MD 04/10/2020  I, Molly Dorshimer, am acting as  scribe for Dr. Grayton Lobo.  I have reviewed the above documentation for accuracy and completeness, and I agree with the above.       

## 2020-04-10 ENCOUNTER — Other Ambulatory Visit: Payer: Self-pay

## 2020-04-10 ENCOUNTER — Inpatient Hospital Stay: Payer: Medicare PPO | Attending: Hematology and Oncology | Admitting: Hematology and Oncology

## 2020-04-10 DIAGNOSIS — Z170 Estrogen receptor positive status [ER+]: Secondary | ICD-10-CM | POA: Diagnosis not present

## 2020-04-10 DIAGNOSIS — Z79899 Other long term (current) drug therapy: Secondary | ICD-10-CM | POA: Diagnosis not present

## 2020-04-10 DIAGNOSIS — C50511 Malignant neoplasm of lower-outer quadrant of right female breast: Secondary | ICD-10-CM | POA: Diagnosis not present

## 2020-04-10 DIAGNOSIS — M2550 Pain in unspecified joint: Secondary | ICD-10-CM | POA: Insufficient documentation

## 2020-04-10 DIAGNOSIS — M8580 Other specified disorders of bone density and structure, unspecified site: Secondary | ICD-10-CM | POA: Diagnosis not present

## 2020-04-10 DIAGNOSIS — C779 Secondary and unspecified malignant neoplasm of lymph node, unspecified: Secondary | ICD-10-CM | POA: Insufficient documentation

## 2020-04-10 DIAGNOSIS — D72829 Elevated white blood cell count, unspecified: Secondary | ICD-10-CM | POA: Insufficient documentation

## 2020-04-10 DIAGNOSIS — M791 Myalgia, unspecified site: Secondary | ICD-10-CM | POA: Diagnosis not present

## 2020-04-10 NOTE — Assessment & Plan Note (Signed)
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 1 out of 2 lymph nodes were positive for micrometastasis.Oncotype 12 (8% ROR) T1b N1 mic stage IB XRT from 03/20/15 to 04/14/2015  Current treatment: Anastrozole 1 mg daily started November 2016switched to letrozole 03/01/2019 Letrozole toxicities: 1.Diffuse arthralgias and myalgias: Doing better since she got injections in her knees 2.Osteopenia: Bone density 04/23/2018: T score -1.5: Continue with calcium and vitamin D and Fosamax and weightbearing exercises.  We will obtain a new bone density test in October of this year 3.Weight gain:Monitoring  Breast cancer index 03/20/2020: No benefit from extended adjuvant therapy, risk of distant recurrence 1.2% Based on these results I recommended discontinuation of antiestrogen therapy having completed 5 years.  Breast cancer surveillance: 1.Breast exam4/05/2020: Benign 2.Mammogram 02/25/2020: Benign breast density category C 3.Breast MRI 11/10/2019: No evidence of malignancy, breast density category C  Continue with annual breast MRIs in addition to annual mammograms

## 2020-04-11 DIAGNOSIS — M170 Bilateral primary osteoarthritis of knee: Secondary | ICD-10-CM | POA: Diagnosis not present

## 2020-04-12 ENCOUNTER — Telehealth: Payer: Self-pay | Admitting: Hematology and Oncology

## 2020-04-12 DIAGNOSIS — R300 Dysuria: Secondary | ICD-10-CM | POA: Diagnosis not present

## 2020-04-12 DIAGNOSIS — R102 Pelvic and perineal pain: Secondary | ICD-10-CM | POA: Diagnosis not present

## 2020-04-12 NOTE — Telephone Encounter (Signed)
Scheduled per 10/11 los. Called and spoke with pt, confirmed 10/20 appt

## 2020-04-13 DIAGNOSIS — M6289 Other specified disorders of muscle: Secondary | ICD-10-CM | POA: Diagnosis not present

## 2020-04-13 DIAGNOSIS — R300 Dysuria: Secondary | ICD-10-CM | POA: Diagnosis not present

## 2020-04-13 DIAGNOSIS — M62838 Other muscle spasm: Secondary | ICD-10-CM | POA: Diagnosis not present

## 2020-04-13 DIAGNOSIS — R102 Pelvic and perineal pain: Secondary | ICD-10-CM | POA: Diagnosis not present

## 2020-04-18 ENCOUNTER — Other Ambulatory Visit: Payer: Self-pay

## 2020-04-18 ENCOUNTER — Ambulatory Visit (HOSPITAL_COMMUNITY)
Admission: RE | Admit: 2020-04-18 | Discharge: 2020-04-18 | Disposition: A | Discharge status: Home / Self Care | Payer: Medicare PPO | Source: Ambulatory Visit | Attending: Hematology and Oncology | Admitting: Hematology and Oncology

## 2020-04-18 ENCOUNTER — Encounter (HOSPITAL_COMMUNITY): Payer: Self-pay

## 2020-04-18 DIAGNOSIS — Z170 Estrogen receptor positive status [ER+]: Secondary | ICD-10-CM | POA: Insufficient documentation

## 2020-04-18 DIAGNOSIS — R109 Unspecified abdominal pain: Secondary | ICD-10-CM | POA: Diagnosis not present

## 2020-04-18 DIAGNOSIS — C50511 Malignant neoplasm of lower-outer quadrant of right female breast: Secondary | ICD-10-CM | POA: Insufficient documentation

## 2020-04-18 LAB — POCT I-STAT CREATININE: Creatinine, Ser: 0.9 mg/dL (ref 0.44–1.00)

## 2020-04-18 MED ORDER — IOHEXOL 300 MG/ML  SOLN
100.0000 mL | Freq: Once | INTRAMUSCULAR | Status: AC | PRN
  Administered 2020-04-18: 100 mL via INTRAVENOUS

## 2020-04-18 NOTE — Progress Notes (Signed)
Patient Care Team: [name], MD as PCP - General [name], MD as Consulting Physician (General Surgery) Kyung Rudd, MD as Consulting Physician (Radiation Oncology) Sylvan Cheese, NP as Nurse Practitioner (Hematology and Oncology) Nicholas Lose, MD as Consulting Physician (Hematology and Oncology)  DIAGNOSIS:    ICD-10-CM   1. Malignant neoplasm of lower-outer quadrant of right breast of female, estrogen receptor positive ([place])  C50.511    Z17.0   2. Leukocytosis, unspecified type  D72.829     SUMMARY OF ONCOLOGIC HISTORY: Oncology History Overview Note  Breast cancer of lower-outer quadrant of right female breast   Staging form: Breast, AJCC 7th Edition     Clinical: Stage IA (T1b, N0, M0) - Unsigned     Pathologic stage from 02/13/2015: Stage IB (T1b, N56m, cM0) - Unsigned      Breast cancer of lower-outer quadrant of right female breast ([place]  01/05/2015 Mammogram   Right breast: possible mass warranting further evaluation   01/05/2015 Breast MRI   10 mm diameter enhancing mass in the lower outer right breast suspicious for malignancy   01/18/2015 Mammogram   Diagnostic mammo and ultrasound showed a 0.6 cm mass in the right breast 8:00 location 6 cm from the nipple, and a 0.5 cm lesion in the 10:00 location of the right breast 6 cm from the nipple. Right axilla was negative for adenopathy.   01/19/2015 Initial Biopsy   Right breast 8:00 mass biopsy showed invasive ductal carcinoma and DCIS, grade 1-2, the 10:00 mass biopsy showed fibrocystic change.ER 100% positive, PR 90% positive, HER-2 negative, Ki-67 10%   01/19/2015 Clinical Stage   Stage IA: T1b N0   02/13/2015 Definitive Surgery   Right breast lumpectomy: margins negative; Invasive ductal carcinoma, G1, 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 1 out of 2 lymph nodes were positive for micrometastasis.   02/13/2015 Oncotype testing   RS 12 (8% ROR)   02/13/2015 Pathologic Stage   Stage IB: T1b  N1(mi)   03/10/2015 Procedure   Breast/Ovarian Panel (Gene Dx): no clinically significant variant at ATM, BARD1, BRCA1, BRCA2, BRIP1, CDH1, CHEK2, FANCC, MLH1, MSH2, MSH6, NBN, PALB2, PMS2, PTEN, RAD51C, RAD51D, TP53, and XRCC2.    03/20/2015 - 04/14/2015 Radiation Therapy   Adjuvant XRT ([facility]: 42.5 Gy over 17 fractions to the breast using whole-breast tangent fields. Right breast boost 7.5 Gy over 3 fractions. Total dose: 50 Gy   04/19/2015 -  Anti-estrogen oral therapy   Anastrozole 1 mg daily. Planned duration of therapy 5-10 years. Breast cancer index (03/20/20) revealed chance of distant recurrence is 1.2%, no benefit of continued antiestrogen therapy.   07/05/2015 Survivorship   Survivorship care plan completed and mailed to patient in lieu of in person visit.   11/15/2015 Breast MRI   No evidence of breast malignancy. Anterior and lateral skin thickening consistent with radiation change     CHIEF COMPLIANT: Follow-upofrightbreast cancer and leukocytosis  INTERVAL HISTORY: Shannon ISHIKAWAis a 69y.o. with above-mentioned history of right breast cancer treated with lumpectomy,radiation,and whois currently on anastrozole.She also has a history of leukocytosis. CT abdomen/pelvis on 04/18/20 showed no evidence of malignancy and no cause for her abdominal pain. She presents to the clinic today to review her scan.   The scans show cystocele and degenerative arthritis and foraminal narrowing at L5-S1.  Otherwise no other abnormalities noted. She tells me that the without antibiotics she gets urinary tract infection symptoms.  Part of it is also vaginal dryness.  ALLERGIES:  is allergic to olive oil and oxycodone.  MEDICATIONS:  Current Outpatient Medications  Medication Sig Dispense Refill  . acetaminophen (TYLENOL) 325 MG tablet Take 650 mg by mouth every 6 (six) hours as needed.    Marland Kitchen alendronate (FOSAMAX) 70 MG tablet Take 1 tablet (70 mg total) by mouth once a week. Take with  a full glass of water on an empty stomach. 12 tablet 3  . ALPRAZolam (XANAX) 1 MG tablet TAKE 1 TABLET BY MOUTH 4 TIMES A DAY AS NEEDED FOR SLEEP OR ANXIETY 120 tablet 0  . amLODipine (NORVASC) 5 MG tablet Take 5 mg by mouth daily.    Marland Kitchen anastrozole (ARIMIDEX) 1 MG tablet Take 1 tablet (1 mg total) by mouth daily. 90 tablet 3  . budesonide-formoterol (SYMBICORT) 80-4.5 MCG/ACT inhaler Inhale 2 puffs into the lungs 2 (two) times daily.    . Cholecalciferol (VITAMIN D) 125 MCG (5000 UT) CAPS Take 1 capsule by mouth daily.    . fexofenadine (ALLEGRA) 180 MG tablet Take 180 mg by mouth daily.    . fluticasone (FLONASE) 50 MCG/ACT nasal spray Place into both nostrils as needed for allergies or rhinitis.    . Lactobacillus (PROBIOTIC ACIDOPHILUS PO) Take by mouth.    . losartan-hydrochlorothiazide (HYZAAR) 100-12.5 MG per tablet TAKE 1 TABLET BY MOUTH DAILY. 30 tablet 0  . LYRICA 100 MG capsule TAKE ONE CAPSULE TWICE A DAY 60 capsule 3  . magnesium oxide (MAG-OX) 400 MG tablet Take 400 mg by mouth daily.    . meloxicam (MOBIC) 7.5 MG tablet Take 1 tablet (7.5 mg total) by mouth daily. Reported on 11/15/2015 (Patient taking differently: Take 7.5 mg by mouth 2 (two) times daily. )    . methocarbamol (ROBAXIN) 500 MG tablet 2 (two) times daily as needed.    . metoprolol succinate (TOPROL-XL) 25 MG 24 hr tablet     . omeprazole (PRILOSEC) 40 MG capsule omeprazole 40 mg capsule,delayed release  1 CAPSULE 30 MINUTES BEFORE MORNING MEAL ONCE A DAY ORALLY 90 DAYS    . psyllium (METAMUCIL) 0.52 g capsule Take 1 capsule (0.52 g total) by mouth daily.    . solifenacin (VESICARE) 5 MG tablet solifenacin 5 mg tablet  TAKE 1 TABLET BY MOUTH EVERY DAY    . ZETIA 10 MG tablet TAKE 1 TABLET BY MOUTH DAILY. 90 tablet 1   No current facility-administered medications for this visit.    PHYSICAL EXAMINATION: ECOG PERFORMANCE STATUS: 1 - Symptomatic but completely ambulatory  Vitals:   04/19/20 0955  BP: 127/61    Pulse: 77  Resp: 17  Temp: 98.2 F (36.8 C)  SpO2: 99%   Filed Weights   04/19/20 0955  Weight: 170 lb 11.2 oz (77.4 kg)     LABORATORY DATA:  I have reviewed the data as listed CMP Latest Ref Rng & Units 04/18/2020 07/27/2019 03/03/2019  Glucose 65 - 99 mg/dL - 79 -  BUN 8 - 27 mg/dL - 18 -  Creatinine 0.44 - 1.00 mg/dL 0.90 0.97 1.00  Sodium 134 - 144 mmol/L - 141 -  Potassium 3.5 - 5.2 mmol/L - 4.4 -  Chloride 96 - 106 mmol/L - 101 -  CO2 20 - 29 mmol/L - 24 -  Calcium 8.7 - 10.3 mg/dL - 10.1 -  Total Protein 6.0 - 8.5 g/dL - 6.9 -  Total Bilirubin 0.0 - 1.2 mg/dL - 0.2 -  Alkaline Phos 39 - 117 IU/L - 83 -  AST 0 - 40  IU/L - 23 -  ALT 0 - 32 IU/L - 24 -    Lab Results  Component Value Date   WBC 6.4 07/27/2019   HGB 11.6 07/27/2019   HCT 36.7 07/27/2019   MCV 83 07/27/2019   PLT 208 07/27/2019   NEUTROABS 3.4 07/27/2019    ASSESSMENT & PLAN:  Breast cancer of lower-outer quadrant of right female breast Right breast lumpectomy 02/13/2015: margins negative; Invasive ductal carcinoma, G1, 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 1 out of 2 lymph nodes were positive for micrometastasis.Oncotype 12 (8% ROR) T1b N1 mic stage IB XRT from 03/20/15 to 04/14/2015  Current treatment: Anastrozole 1 mg daily started November 2016switched to letrozole 03/01/2019 Letrozole toxicities: 1.Diffuse arthralgias and myalgias:Doing better since she got injections in her knees 2.Osteopenia: Bone density 04/23/2018: T score -1.5: Continue with calcium and vitamin Dand Fosamaxand weightbearing exercises. We will obtain a new bone density test in October of this year 3.Weight gain:Monitoring  Breast cancer index 03/20/2020: No benefit from extended adjuvant therapy, risk of distant recurrence 1.2% Based on these results I recommended discontinuation of antiestrogen therapy having completed 5 years.  Breast cancer surveillance: 1.Breast exam4/05/2020:  Benign 2.Mammogram8/27/2021: Benign breast density category C 3.Breast MRI5/05/2020:No evidence of malignancy,breast density category C  CT abdomen pelvis: Small cystocele, no evidence of bone abnormalities.  Foraminal narrowing L5-S1 Patient will follow up with her gynecologist about the cystocele Return to clinic in August 2022 for follow-up.  No orders of the defined types were placed in this encounter.  The patient has a good understanding of the overall plan. she agrees with it. she will call with any problems that may develop before the next visit here.  Total time spent: 30 mins including face to face time and time spent for planning, charting and coordination of care  Nicholas Lose, MD 04/19/2020  I, Cloyde Reams Dorshimer, am acting as scribe for Dr. Nicholas Lose.  I have reviewed the above documentation for accuracy and completeness, and I agree with the above.

## 2020-04-19 ENCOUNTER — Other Ambulatory Visit: Payer: Self-pay

## 2020-04-19 ENCOUNTER — Inpatient Hospital Stay: Payer: Medicare PPO | Admitting: Hematology and Oncology

## 2020-04-19 DIAGNOSIS — Z170 Estrogen receptor positive status [ER+]: Secondary | ICD-10-CM | POA: Diagnosis not present

## 2020-04-19 DIAGNOSIS — M2550 Pain in unspecified joint: Secondary | ICD-10-CM | POA: Diagnosis not present

## 2020-04-19 DIAGNOSIS — D72829 Elevated white blood cell count, unspecified: Secondary | ICD-10-CM | POA: Insufficient documentation

## 2020-04-19 DIAGNOSIS — C779 Secondary and unspecified malignant neoplasm of lymph node, unspecified: Secondary | ICD-10-CM | POA: Diagnosis not present

## 2020-04-19 DIAGNOSIS — M8580 Other specified disorders of bone density and structure, unspecified site: Secondary | ICD-10-CM | POA: Diagnosis not present

## 2020-04-19 DIAGNOSIS — C50511 Malignant neoplasm of lower-outer quadrant of right female breast: Secondary | ICD-10-CM

## 2020-04-19 DIAGNOSIS — M791 Myalgia, unspecified site: Secondary | ICD-10-CM | POA: Diagnosis not present

## 2020-04-19 DIAGNOSIS — Z79899 Other long term (current) drug therapy: Secondary | ICD-10-CM | POA: Diagnosis not present

## 2020-04-19 NOTE — Assessment & Plan Note (Signed)
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 1 out of 2 lymph nodes were positive for micrometastasis.Oncotype 12 (8% ROR) T1b N1 mic stage IB XRT from 03/20/15 to 04/14/2015  Current treatment: Anastrozole 1 mg daily started November 2016switched to letrozole 03/01/2019 Letrozole toxicities: 1.Diffuse arthralgias and myalgias:Doing better since Shannon Hancock got injections in her knees 2.Osteopenia: Bone density 04/23/2018: T score -1.5: Continue with calcium and vitamin Dand Fosamaxand weightbearing exercises. We will obtain a new bone density test in October of this year 3.Weight gain:Monitoring  Breast cancer index 03/20/2020: No benefit from extended adjuvant therapy, risk of distant recurrence 1.2% Based on these results I recommended discontinuation of antiestrogen therapy having completed 5 years.  Breast cancer surveillance: 1.Breast exam4/05/2020: Benign 2.Mammogram8/27/2021: Benign breast density category C 3.Breast MRI5/05/2020:No evidence of malignancy,breast density category C

## 2020-04-21 DIAGNOSIS — D72829 Elevated white blood cell count, unspecified: Secondary | ICD-10-CM | POA: Diagnosis not present

## 2020-04-23 DIAGNOSIS — R21 Rash and other nonspecific skin eruption: Secondary | ICD-10-CM | POA: Diagnosis not present

## 2020-04-24 ENCOUNTER — Ambulatory Visit (HOSPITAL_COMMUNITY)
Admission: EM | Admit: 2020-04-24 | Discharge: 2020-04-24 | Disposition: A | Discharge status: Home / Self Care | Payer: Medicare PPO | Attending: Internal Medicine | Admitting: Internal Medicine

## 2020-04-24 ENCOUNTER — Other Ambulatory Visit: Payer: Self-pay

## 2020-04-24 ENCOUNTER — Encounter (HOSPITAL_COMMUNITY): Payer: Self-pay | Admitting: *Deleted

## 2020-04-24 DIAGNOSIS — H903 Sensorineural hearing loss, bilateral: Secondary | ICD-10-CM | POA: Diagnosis not present

## 2020-04-24 DIAGNOSIS — T7840XA Allergy, unspecified, initial encounter: Secondary | ICD-10-CM

## 2020-04-24 MED ORDER — PREDNISONE 10 MG (21) PO TBPK: ORAL_TABLET | Freq: Every day | ORAL | 0 refills | Status: DC

## 2020-04-24 MED ORDER — CALAMINE EX LOTN: 1.0000 "application " | TOPICAL_LOTION | CUTANEOUS | 0 refills | Status: DC | PRN

## 2020-04-24 MED ORDER — DIPHENHYDRAMINE HCL 25 MG PO TABS: 25.0000 mg | ORAL_TABLET | Freq: Three times a day (TID) | ORAL | 0 refills | Status: DC | PRN

## 2020-04-24 NOTE — ED Provider Notes (Signed)
[facility]    CSN: [id number] Arrival date & time: 04/24/20  1043      History   Chief Complaint Chief Complaint  Patient presents with   Rash    HPI Shannon Hancock is a 69 y.o. female comes to urgent care with complaints of persistent pruritic rash which started on Sunday.  Patient went to a [place] today evening at about 2 AM on Sunday she experienced pruritic generalized rash.  She denied any tongue swelling, lip swelling or feeling of throat closing.  No wheezing.  Patient was seen in a walk-in clinic and was given a shot of steroids, hydroxyzine and some prednisone.  According to the patient the itching has not improved.  The rash is persistent and may have gotten slightly worse.  Patient denies any change in cosmetics agents.  No changes in prescription medications.  Patient refilled so for medications last week.  Patient denies any shortness of breath, tongue swelling or chest tightness.Marland Kitchen   HPI  Past Medical History:  Diagnosis Date   Allergy    Anxiety    Breast cancer ([place]) 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; ER/PR+, Her2-, ki67=10%   Depression    Diabetes mellitus without complication (HCC)    Diverticulosis    Fibromyalgia    GERD (gastroesophageal reflux disease)    Heart murmur    High cholesterol    Hypertension    Joint pain    Prediabetes     Patient Active Problem List   Diagnosis Date Noted   Leukocytosis 04/19/2020   Thyroid cyst 02/26/2018   Plantar fasciitis 02/19/2016   Family history of breast cancer in sister 03/10/2015   Breast cancer of lower-outer quadrant of right female breast ([place]) 02/10/2015   Abdominal bloating 12/16/2013   Edema 11/09/2013   Urge incontinence 05/05/2013   Atrophic vaginitis 05/05/2013   Syncope 02/15/2013   Irritable bowel syndrome 08/20/2012   Hearing loss 11/07/2011   Generalized anxiety disorder 11/07/2011   Peripheral neuropathy 07/05/2011    Osteoporosis 07/05/2011   Tinnitus, bilateral 04/05/2011   HYPERCHOLESTEROLEMIA 04/05/2010   ANEMIA, IRON DEFICIENCY 04/03/2010   DEPRESSION, MAJOR, MODERATE 04/03/2010   Essential hypertension 04/03/2010   ALLERGIC RHINITIS 04/03/2010   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/03/2010   POSTMENOPAUSAL SYNDROME 04/03/2010   DEGENERATIVE JOINT DISEASE, CERVICAL SPINE 04/03/2010   FIBROMYALGIA 04/03/2010    Past Surgical History:  Procedure Laterality Date   BREAST LUMPECTOMY Right 2016   BREAST LUMPECTOMY WITH RADIOACTIVE SEED AND SENTINEL LYMPH NODE BIOPSY Right 02/13/2015   Procedure: RIGHT BREAST LUMPECTOMY WITH RADIOACTIVE SEED AND SENTINEL LYMPH NODE MAPPING;  Surgeon: [name], MD;  Location: [facility];  Service: General;  Laterality: Right;   FOOT SURGERY Left    TUBAL LIGATION      OB History    Gravida  2   Para      Term      Preterm      AB      Living        SAB      TAB      Ectopic      Multiple      Live Births               Home Medications    Prior to Admission medications   Medication Sig Start Date End Date Taking? Authorizing Provider  acetaminophen (TYLENOL) 325 MG tablet Take 650 mg by mouth every 6 (  six) hours as needed.   Yes [provider]  alendronate (FOSAMAX) 70 MG tablet Take 1 tablet (70 mg total) by mouth once a week. Take with a full glass of water on an empty stomach. 10/11/19  Yes Nicholas Lose, MD  ALPRAZolam (XANAX) 1 MG tablet TAKE 1 TABLET BY MOUTH 4 TIMES A DAY AS NEEDED FOR SLEEP OR ANXIETY   Yes Copland, Spencer, MD  amLODipine (NORVASC) 5 MG tablet Take 5 mg by mouth daily.   Yes [provider]  budesonide-formoterol (SYMBICORT) 80-4.5 MCG/ACT inhaler Inhale 2 puffs into the lungs 2 (two) times daily.   Yes [provider]  Cholecalciferol (VITAMIN D) 125 MCG (5000 UT) CAPS Take 1 capsule by mouth daily.   Yes [provider]    fexofenadine (ALLEGRA) 180 MG tablet Take 180 mg by mouth daily.   Yes [provider]  fluticasone (FLONASE) 50 MCG/ACT nasal spray Place into both nostrils as needed for allergies or rhinitis.   Yes [provider]  Lactobacillus (PROBIOTIC ACIDOPHILUS PO) Take by mouth.   Yes [provider]  losartan-hydrochlorothiazide (HYZAAR) 100-12.5 MG per tablet TAKE 1 TABLET BY MOUTH DAILY. 01/10/15  Yes Bedsole, Amy E, MD  LYRICA 100 MG capsule TAKE ONE CAPSULE TWICE A DAY 06/17/13  Yes Bedsole, Amy E, MD  magnesium oxide (MAG-OX) 400 MG tablet Take 400 mg by mouth daily.   Yes [provider]  meloxicam (MOBIC) 7.5 MG tablet Take 1 tablet (7.5 mg total) by mouth daily. Reported on 11/15/2015 Patient taking differently: Take 7.5 mg by mouth 2 (two) times daily.  02/03/17  Yes Nicholas Lose, MD  methocarbamol (ROBAXIN) 500 MG tablet 2 (two) times daily as needed. 08/19/19  Yes [provider]  metoprolol succinate (TOPROL-XL) 25 MG 24 hr tablet  09/09/19  Yes [provider]  omeprazole (PRILOSEC) 40 MG capsule omeprazole 40 mg capsule,delayed release  1 CAPSULE 30 MINUTES BEFORE MORNING MEAL ONCE A DAY ORALLY 90 DAYS   Yes [provider]  psyllium (METAMUCIL) 0.52 g capsule Take 1 capsule (0.52 g total) by mouth daily. 03/01/19  Yes Nicholas Lose, MD  solifenacin (VESICARE) 5 MG tablet solifenacin 5 mg tablet  TAKE 1 TABLET BY MOUTH EVERY DAY   Yes [provider]  ZETIA 10 MG tablet TAKE 1 TABLET BY MOUTH DAILY. 06/12/14  Yes Bedsole, Amy E, MD  anastrozole (ARIMIDEX) 1 MG tablet Take 1 tablet (1 mg total) by mouth daily. 10/11/19   Nicholas Lose, MD  calamine lotion Apply 1 application topically as needed for itching. 04/24/20   Tayelor Osborne, Myrene Galas, MD  diphenhydrAMINE (BENADRYL) 25 MG tablet Take 1 tablet (25 mg total) by mouth every 8 (eight) hours as needed. 04/24/20   Bellamy Rubey, Myrene Galas, MD  predniSONE (STERAPRED UNI-PAK 21 TAB) 10  MG (21) TBPK tablet Take by mouth daily. Take 6 tabs by mouth daily  for 2 days, then 5 tabs for 2 days, then 4 tabs for 2 days, then 3 tabs for 2 days, 2 tabs for 2 days, then 1 tab by mouth daily for 2 days 04/24/20   Atianna Haidar, Myrene Galas, MD    Family History Family History  Problem Relation Age of Onset   Alzheimer's disease Mother    Stroke Mother    Diabetes Mother    Hypertension Mother    Hyperlipidemia Mother    Depression Mother    Anxiety disorder Mother    Coronary artery disease Father  Hypertension Father    Depression Father    Prostate cancer Father 44   Colon polyps Father        unspecified number   Diabetes Father    Hyperlipidemia Father    Heart disease Father    Kidney disease Father    Breast cancer Sister 25   Colon polyps Sister        3-4 total   Urinary tract infection Sister    Hypertension Brother    Hyperlipidemia Brother    Prostate cancer Brother 39   Breast cancer Maternal Aunt        dx. 41s   Kidney cancer Cousin        dx. 88s; smoker while in college   Lung cancer Maternal Uncle        smoker   Cancer Paternal Aunt        unspecified type   Cirrhosis Paternal Uncle    Colon cancer Maternal Grandmother 40   Cancer Maternal Grandfather        unspecified type   Parkinson's disease Maternal Grandfather    Kidney cancer Paternal Grandfather        dx. 61s   Healthy Daughter    Breast cancer Cousin    Lung cancer Other        MGM's sister   Breast cancer Other    Lung cancer Other    Breast cancer Paternal Aunt 8   Congestive Heart Failure Paternal Uncle    Diabetes Paternal Uncle    Healthy Daughter     Social History Social History   Tobacco Use   Smoking status: Former Smoker    Packs/day: 0.00    Years: 4.00    Pack years: 0.00    Types: Cigarettes    Quit date: 07/01/1972    Years since quitting: 47.8   Smokeless tobacco: Never Used   Tobacco comment: only smoked in  college -   Vaping Use   Vaping Use: Never used  Substance Use Topics   Alcohol use: No   Drug use: No     Allergies   Olive oil and Oxycodone   Review of Systems Review of Systems  Constitutional: Negative.   Gastrointestinal: Negative.   Genitourinary: Negative.   Musculoskeletal: Negative.   Skin: Positive for rash. Negative for color change, pallor and wound.  Neurological: Negative.      Physical Exam Triage Vital Signs ED Triage Vitals  Enc Vitals Group     BP 04/24/20 1150 (!) 142/62     Pulse Rate 04/24/20 1150 81     Resp 04/24/20 1150 18     Temp 04/24/20 1150 97.9 F (36.6 C)     Temp Source 04/24/20 1150 Oral     SpO2 04/24/20 1150 99 %     Weight 04/24/20 1152 170 lb (77.1 kg)     Height 04/24/20 1152 5' 3"  (1.6 m)     Head Circumference --      Peak Flow --      Pain Score 04/24/20 1152 0     Pain Loc --      Pain Edu? --      Excl. in [place]? --    No data found.  Updated Vital Signs BP (!) 142/62 (BP Location: Left Arm)    Pulse 81    Temp 97.9 F (36.6 C) (Oral)    Resp 18    Ht 5' 3"  (1.6 m)    Wt 77.1 kg  SpO2 99%    BMI 30.11 kg/m   Visual Acuity Right Eye Distance:   Left Eye Distance:   Bilateral Distance:    Right Eye Near:   Left Eye Near:    Bilateral Near:     Physical Exam Vitals and nursing note reviewed.  Constitutional:      General: She is in acute distress.     Appearance: She is not ill-appearing.  HENT:     Mouth/Throat:     Mouth: Mucous membranes are moist.     Comments: Uvula is midline Cardiovascular:     Rate and Rhythm: Normal rate and regular rhythm.     Pulses: Normal pulses.     Heart sounds: Normal heart sounds.  Musculoskeletal:        General: Normal range of motion.  Skin:    General: Skin is warm.     Findings: Rash present.     Comments: Generalized urticarial rash over the upper extremities and torso.  No tongue/lip swelling.  Neurological:     Mental Status: She is alert.      UC  Treatments / Results  Labs (all labs ordered are listed, but only abnormal results are displayed) Labs Reviewed - No data to display  EKG   Radiology No results found.  Procedures Procedures (including critical care time)  Medications Ordered in UC Medications - No data to display  Initial Impression / Assessment and Plan / UC Course  I have reviewed the triage vital signs and the nursing notes.  Pertinent labs & imaging results that were available during my care of the patient were reviewed by me and considered in my medical decision making (see chart for details).     1.  Allergic reaction, probably related to something she ate on the Saturday night: It is not clear to me exactly the treatment regimen given to the patient.  After further discussion with the patient she agrees with doing a tapering dose of prednisone, Benadryl as needed for itching and over-the-counter calamine lotion.  Patient is advised not to operate machinery or drive while is on Benadryl.  Patient verbalized understanding. Final Clinical Impressions(s) / UC Diagnoses   Final diagnoses:  Allergic reaction, initial encounter     Discharge Instructions     Please take medications as prescribed Do not operate a vehicle or equipment while taking Benadryl Return to urgent care if symptoms persist.   ED Prescriptions    Medication Sig Dispense Auth. Provider   predniSONE (STERAPRED UNI-PAK 21 TAB) 10 MG (21) TBPK tablet Take by mouth daily. Take 6 tabs by mouth daily  for 2 days, then 5 tabs for 2 days, then 4 tabs for 2 days, then 3 tabs for 2 days, 2 tabs for 2 days, then 1 tab by mouth daily for 2 days 42 tablet Gwendloyn Forsee, Myrene Galas, MD   diphenhydrAMINE (BENADRYL) 25 MG tablet Take 1 tablet (25 mg total) by mouth every 8 (eight) hours as needed. 30 tablet Shevelle Smither, Myrene Galas, MD   calamine lotion Apply 1 application topically as needed for itching. 120 mL Keimya Briddell, Myrene Galas, MD     PDMP not reviewed this  encounter.   Chase Picket, MD 04/24/20 [phone]

## 2020-04-24 NOTE — Discharge Instructions (Addendum)
Please take medications as prescribed Do not operate a vehicle or equipment while taking Benadryl Return to urgent care if symptoms persist.

## 2020-04-24 NOTE — ED Triage Notes (Signed)
Pt reports full body rash and went to walk in clinic Sunday. Pt reports she was given a shot and pills but rash has not improved.

## 2020-04-26 DIAGNOSIS — M25571 Pain in right ankle and joints of right foot: Secondary | ICD-10-CM | POA: Diagnosis not present

## 2020-04-26 DIAGNOSIS — M1712 Unilateral primary osteoarthritis, left knee: Secondary | ICD-10-CM | POA: Diagnosis not present

## 2020-04-26 DIAGNOSIS — M25561 Pain in right knee: Secondary | ICD-10-CM | POA: Diagnosis not present

## 2020-04-26 DIAGNOSIS — M25572 Pain in left ankle and joints of left foot: Secondary | ICD-10-CM | POA: Diagnosis not present

## 2020-05-02 DIAGNOSIS — Z9189 Other specified personal risk factors, not elsewhere classified: Secondary | ICD-10-CM | POA: Diagnosis not present

## 2020-05-02 DIAGNOSIS — Z853 Personal history of malignant neoplasm of breast: Secondary | ICD-10-CM | POA: Diagnosis not present

## 2020-05-02 DIAGNOSIS — N952 Postmenopausal atrophic vaginitis: Secondary | ICD-10-CM | POA: Diagnosis not present

## 2020-05-10 DIAGNOSIS — M1712 Unilateral primary osteoarthritis, left knee: Secondary | ICD-10-CM | POA: Diagnosis not present

## 2020-05-10 DIAGNOSIS — M25571 Pain in right ankle and joints of right foot: Secondary | ICD-10-CM | POA: Diagnosis not present

## 2020-05-10 DIAGNOSIS — M25572 Pain in left ankle and joints of left foot: Secondary | ICD-10-CM | POA: Diagnosis not present

## 2020-05-12 DIAGNOSIS — M1712 Unilateral primary osteoarthritis, left knee: Secondary | ICD-10-CM | POA: Diagnosis not present

## 2020-05-12 DIAGNOSIS — M25572 Pain in left ankle and joints of left foot: Secondary | ICD-10-CM | POA: Diagnosis not present

## 2020-05-12 DIAGNOSIS — M25571 Pain in right ankle and joints of right foot: Secondary | ICD-10-CM | POA: Diagnosis not present

## 2020-05-15 ENCOUNTER — Telehealth: Payer: Self-pay | Admitting: *Deleted

## 2020-05-15 ENCOUNTER — Encounter: Payer: Self-pay | Admitting: *Deleted

## 2020-05-15 NOTE — Telephone Encounter (Signed)
Received call from pt requesting recent office note from Dr. Lindi Adie be faxed to PCP Dr. [name].  RN successfully routed office note to PCP through epic.

## 2020-05-24 DIAGNOSIS — H903 Sensorineural hearing loss, bilateral: Secondary | ICD-10-CM | POA: Diagnosis not present

## 2020-05-24 DIAGNOSIS — H9313 Tinnitus, bilateral: Secondary | ICD-10-CM | POA: Diagnosis not present

## 2020-05-29 ENCOUNTER — Other Ambulatory Visit: Payer: Self-pay | Admitting: Otolaryngology

## 2020-05-29 DIAGNOSIS — H918X9 Other specified hearing loss, unspecified ear: Secondary | ICD-10-CM

## 2020-05-30 NOTE — Progress Notes (Deleted)
Office Visit Note  Patient: Shannon Hancock             Date of Birth: [birth year]           MRN: [id number]             PCP: [name], MD Referring: [name], MD Visit Date: 06/12/2020 Occupation: @GUAROCC @  Subjective:  No chief complaint on file.   History of Present Illness: Shannon Hancock is a 69 y.o. female ***   Activities of Daily Living:  Patient reports morning stiffness for *** {minute/hour:19697}.   Patient {ACTIONS;DENIES/REPORTS:21021675::"Denies"} nocturnal pain.  Difficulty dressing/grooming: {ACTIONS;DENIES/REPORTS:21021675::"Denies"} Difficulty climbing stairs: {ACTIONS;DENIES/REPORTS:21021675::"Denies"} Difficulty getting out of chair: {ACTIONS;DENIES/REPORTS:21021675::"Denies"} Difficulty using hands for taps, buttons, cutlery, and/or writing: {ACTIONS;DENIES/REPORTS:21021675::"Denies"}  No Rheumatology ROS completed.   PMFS History:  Patient Active Problem List   Diagnosis Date Noted  . Leukocytosis 04/19/2020  . Thyroid cyst 02/26/2018  . Plantar fasciitis 02/19/2016  . Family history of breast cancer in sister 03/10/2015  . Breast cancer of lower-outer quadrant of right female breast ([place]) 02/10/2015  . Abdominal bloating 12/16/2013  . Edema 11/09/2013  . Urge incontinence 05/05/2013  . Atrophic vaginitis 05/05/2013  . Syncope 02/15/2013  . Irritable bowel syndrome 08/20/2012  . Hearing loss 11/07/2011  . Generalized anxiety disorder 11/07/2011  . Peripheral neuropathy 07/05/2011  . Osteoporosis 07/05/2011  . Tinnitus, bilateral 04/05/2011  . HYPERCHOLESTEROLEMIA 04/05/2010  . ANEMIA, IRON DEFICIENCY 04/03/2010  . DEPRESSION, MAJOR, MODERATE 04/03/2010  . Essential hypertension 04/03/2010  . ALLERGIC RHINITIS 04/03/2010  . 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 POSTMENOPAUSAL SYNDROME 04/03/2010  . 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 FIBROMYALGIA 04/03/2010    Past Medical  History:  Diagnosis Date  . Allergy   . Anxiety   . 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 Depression   . Diabetes mellitus without complication ([place])   . Diverticulosis   . Fibromyalgia   . GERD (gastroesophageal reflux disease)   . Heart murmur   . High cholesterol   . Hypertension   . Joint pain   . Prediabetes     Family History  Problem Relation Age of Onset  . Alzheimer's disease Mother   . Stroke Mother   . Diabetes Mother   . Hypertension Mother   . Hyperlipidemia Mother   . Depression Mother   . Anxiety disorder Mother   . Coronary artery disease Father   . Hypertension Father   . Depression Father   . Prostate cancer Father 16  . Colon polyps Father        unspecified number  . Diabetes Father   . Hyperlipidemia Father   . Heart disease Father   . Kidney disease Father   . Breast cancer Sister 47  . Colon polyps Sister        3-4 total  . Urinary tract infection Sister   . Hypertension Brother   . Hyperlipidemia Brother   . Prostate cancer Brother 20  . Breast cancer Maternal Aunt        dx. 7s  . Kidney cancer Cousin        dx. 46s; smoker while in college  . Lung cancer Maternal Uncle        smoker  . Cancer Paternal Aunt        unspecified type  . Cirrhosis Paternal Uncle   . Colon cancer Maternal Grandmother 30  . Cancer  Maternal Grandfather        unspecified type  . Parkinson's disease Maternal Grandfather   . Kidney cancer Paternal Grandfather        dx. 39s  . Healthy Daughter   . Breast cancer Cousin   . Lung cancer Other        MGM's sister  . Breast cancer Other   . Lung cancer Other   . Breast cancer Paternal Aunt 49  . Congestive Heart Failure Paternal Uncle   . Diabetes Paternal Uncle   . Healthy Daughter    Past Surgical History:  Procedure Laterality Date  . BREAST LUMPECTOMY Right 2016  . BREAST LUMPECTOMY WITH RADIOACTIVE SEED AND SENTINEL LYMPH NODE BIOPSY Right 02/13/2015    Procedure: RIGHT BREAST LUMPECTOMY WITH RADIOACTIVE SEED AND SENTINEL LYMPH NODE MAPPING;  Surgeon: [name], MD;  Location: [facility];  Service: General;  Laterality: Right;  . FOOT SURGERY Left   . TUBAL LIGATION     Social History   Social History Narrative   Regular exercise-no   Living will: None (reviewed 2014)   Immunization History  Administered Date(s) Administered  . Influenza Split 07/05/2011  . Influenza Whole 04/03/2010, 04/04/2010  . Influenza,inj,Quad PF,6+ Mos 03/16/2013  . Moderna SARS-COVID-2 Vaccination 07/23/2019, 08/27/2019, 03/14/2020  . Td 07/01/2001     Objective: Vital Signs: There were no vitals taken for this visit.   Physical Exam   Musculoskeletal Exam: ***  CDAI Exam: CDAI Score: -- Patient Global: --; Provider Global: -- Swollen: --; Tender: -- Joint Exam 06/12/2020   No joint exam has been documented for this visit   There is currently no information documented on the homunculus. Go to the Rheumatology activity and complete the homunculus joint exam.  Investigation: No additional findings.  Imaging: No results found.  Recent Labs: Lab Results  Component Value Date   WBC 6.4 07/27/2019   HGB 11.6 07/27/2019   PLT 208 07/27/2019   NA 141 07/27/2019   K 4.4 07/27/2019   CL 101 07/27/2019   CO2 24 07/27/2019   GLUCOSE 79 07/27/2019   BUN 18 07/27/2019   CREATININE 0.90 04/18/2020   BILITOT 0.2 07/27/2019   ALKPHOS 83 07/27/2019   AST 23 07/27/2019   ALT 24 07/27/2019   PROT 6.9 07/27/2019   ALBUMIN 4.9 (H) 07/27/2019   CALCIUM 10.1 07/27/2019   GFRAA 69 07/27/2019    Speciality Comments: No specialty comments available.  Procedures:  No procedures performed Allergies: Olive oil and Oxycodone   Assessment / Plan:     Visit Diagnoses: No diagnosis found.  Orders: No orders of the defined types were placed in this encounter.  No orders of the defined types were placed in this  encounter.   Face-to-face time spent with patient was *** minutes. Greater than 50% of time was spent in counseling and coordination of care.  Follow-Up Instructions: No follow-ups on file.   Earnestine Mealing, CMA  Note - This record has been created using Editor, commissioning.  Chart creation errors have been sought, but may not always  have been located. Such creation errors do not reflect on  the standard of medical care.

## 2020-06-12 ENCOUNTER — Ambulatory Visit: Payer: Medicare PPO | Admitting: Rheumatology

## 2020-06-12 DIAGNOSIS — M19041 Primary osteoarthritis, right hand: Secondary | ICD-10-CM

## 2020-06-12 DIAGNOSIS — M5030 Other cervical disc degeneration, unspecified cervical region: Secondary | ICD-10-CM

## 2020-06-12 DIAGNOSIS — M722 Plantar fascial fibromatosis: Secondary | ICD-10-CM

## 2020-06-12 DIAGNOSIS — Z170 Estrogen receptor positive status [ER+]: Secondary | ICD-10-CM

## 2020-06-12 DIAGNOSIS — F32A Depression, unspecified: Secondary | ICD-10-CM

## 2020-06-12 DIAGNOSIS — M19011 Primary osteoarthritis, right shoulder: Secondary | ICD-10-CM

## 2020-06-12 DIAGNOSIS — N3011 Interstitial cystitis (chronic) with hematuria: Secondary | ICD-10-CM

## 2020-06-12 DIAGNOSIS — R748 Abnormal levels of other serum enzymes: Secondary | ICD-10-CM

## 2020-06-12 DIAGNOSIS — M797 Fibromyalgia: Secondary | ICD-10-CM

## 2020-06-12 DIAGNOSIS — Z8669 Personal history of other diseases of the nervous system and sense organs: Secondary | ICD-10-CM

## 2020-06-12 DIAGNOSIS — R7303 Prediabetes: Secondary | ICD-10-CM

## 2020-06-12 DIAGNOSIS — M19072 Primary osteoarthritis, left ankle and foot: Secondary | ICD-10-CM

## 2020-06-12 DIAGNOSIS — M810 Age-related osteoporosis without current pathological fracture: Secondary | ICD-10-CM

## 2020-06-12 DIAGNOSIS — I10 Essential (primary) hypertension: Secondary | ICD-10-CM

## 2020-06-12 DIAGNOSIS — M170 Bilateral primary osteoarthritis of knee: Secondary | ICD-10-CM

## 2020-06-12 DIAGNOSIS — Z8719 Personal history of other diseases of the digestive system: Secondary | ICD-10-CM

## 2020-07-06 DIAGNOSIS — H04123 Dry eye syndrome of bilateral lacrimal glands: Secondary | ICD-10-CM | POA: Diagnosis not present

## 2020-07-06 DIAGNOSIS — H35373 Puckering of macula, bilateral: Secondary | ICD-10-CM | POA: Diagnosis not present

## 2020-07-06 DIAGNOSIS — H25812 Combined forms of age-related cataract, left eye: Secondary | ICD-10-CM | POA: Diagnosis not present

## 2020-07-06 DIAGNOSIS — H5202 Hypermetropia, left eye: Secondary | ICD-10-CM | POA: Diagnosis not present

## 2020-07-11 DIAGNOSIS — M170 Bilateral primary osteoarthritis of knee: Secondary | ICD-10-CM | POA: Diagnosis not present

## 2020-07-20 ENCOUNTER — Other Ambulatory Visit: Payer: Medicare PPO

## 2020-07-24 DIAGNOSIS — F419 Anxiety disorder, unspecified: Secondary | ICD-10-CM | POA: Diagnosis not present

## 2020-07-24 DIAGNOSIS — I700 Atherosclerosis of aorta: Secondary | ICD-10-CM | POA: Diagnosis not present

## 2020-07-24 DIAGNOSIS — F3341 Major depressive disorder, recurrent, in partial remission: Secondary | ICD-10-CM | POA: Diagnosis not present

## 2020-07-24 DIAGNOSIS — I10 Essential (primary) hypertension: Secondary | ICD-10-CM | POA: Diagnosis not present

## 2020-07-24 DIAGNOSIS — E785 Hyperlipidemia, unspecified: Secondary | ICD-10-CM | POA: Diagnosis not present

## 2020-07-24 DIAGNOSIS — E1169 Type 2 diabetes mellitus with other specified complication: Secondary | ICD-10-CM | POA: Diagnosis not present

## 2020-07-24 DIAGNOSIS — M797 Fibromyalgia: Secondary | ICD-10-CM | POA: Diagnosis not present

## 2020-07-24 DIAGNOSIS — Z79899 Other long term (current) drug therapy: Secondary | ICD-10-CM | POA: Diagnosis not present

## 2020-07-24 DIAGNOSIS — Z23 Encounter for immunization: Secondary | ICD-10-CM | POA: Diagnosis not present

## 2020-08-02 ENCOUNTER — Other Ambulatory Visit: Payer: Self-pay | Admitting: Hematology and Oncology

## 2020-08-17 DIAGNOSIS — M170 Bilateral primary osteoarthritis of knee: Secondary | ICD-10-CM | POA: Diagnosis not present

## 2020-08-29 DIAGNOSIS — Z79899 Other long term (current) drug therapy: Secondary | ICD-10-CM | POA: Diagnosis not present

## 2020-08-29 DIAGNOSIS — E785 Hyperlipidemia, unspecified: Secondary | ICD-10-CM | POA: Diagnosis not present

## 2020-09-08 DIAGNOSIS — M25572 Pain in left ankle and joints of left foot: Secondary | ICD-10-CM | POA: Diagnosis not present

## 2020-09-08 DIAGNOSIS — M25571 Pain in right ankle and joints of right foot: Secondary | ICD-10-CM | POA: Diagnosis not present

## 2020-10-03 DIAGNOSIS — M5459 Other low back pain: Secondary | ICD-10-CM | POA: Diagnosis not present

## 2020-10-03 DIAGNOSIS — M1711 Unilateral primary osteoarthritis, right knee: Secondary | ICD-10-CM | POA: Diagnosis not present

## 2020-10-10 ENCOUNTER — Ambulatory Visit: Payer: Medicare PPO | Admitting: Hematology and Oncology

## 2020-10-11 DIAGNOSIS — M170 Bilateral primary osteoarthritis of knee: Secondary | ICD-10-CM | POA: Diagnosis not present

## 2020-10-13 ENCOUNTER — Other Ambulatory Visit: Payer: Self-pay

## 2020-10-13 ENCOUNTER — Emergency Department: Payer: Medicare PPO

## 2020-10-13 ENCOUNTER — Encounter: Payer: Self-pay | Admitting: Emergency Medicine

## 2020-10-13 ENCOUNTER — Emergency Department
Admission: EM | Admit: 2020-10-13 | Discharge: 2020-10-13 | Disposition: A | Discharge status: Home / Self Care | Payer: Medicare PPO | Attending: Physician Assistant | Admitting: Physician Assistant

## 2020-10-13 DIAGNOSIS — E119 Type 2 diabetes mellitus without complications: Secondary | ICD-10-CM | POA: Diagnosis not present

## 2020-10-13 DIAGNOSIS — Z853 Personal history of malignant neoplasm of breast: Secondary | ICD-10-CM | POA: Diagnosis not present

## 2020-10-13 DIAGNOSIS — I10 Essential (primary) hypertension: Secondary | ICD-10-CM | POA: Diagnosis not present

## 2020-10-13 DIAGNOSIS — R0981 Nasal congestion: Secondary | ICD-10-CM | POA: Diagnosis present

## 2020-10-13 DIAGNOSIS — Z87891 Personal history of nicotine dependence: Secondary | ICD-10-CM | POA: Diagnosis not present

## 2020-10-13 DIAGNOSIS — Z79899 Other long term (current) drug therapy: Secondary | ICD-10-CM | POA: Insufficient documentation

## 2020-10-13 DIAGNOSIS — J069 Acute upper respiratory infection, unspecified: Secondary | ICD-10-CM | POA: Insufficient documentation

## 2020-10-13 DIAGNOSIS — U071 COVID-19: Secondary | ICD-10-CM | POA: Insufficient documentation

## 2020-10-13 DIAGNOSIS — R059 Cough, unspecified: Secondary | ICD-10-CM | POA: Diagnosis not present

## 2020-10-13 LAB — SARS CORONAVIRUS 2 (TAT 6-24 HRS): SARS Coronavirus 2: POSITIVE — AB

## 2020-10-13 MED ORDER — FEXOFENADINE-PSEUDOEPHED ER 60-120 MG PO TB12: 1.0000 | ORAL_TABLET | Freq: Two times a day (BID) | ORAL | 0 refills | Status: DC

## 2020-10-13 MED ORDER — AMOXICILLIN 875 MG PO TABS: 875.0000 mg | ORAL_TABLET | Freq: Two times a day (BID) | ORAL | 0 refills | Status: DC

## 2020-10-13 NOTE — ED Provider Notes (Signed)
[name]   ____________________________________________   Event Date/Time   First MD Initiated Contact with Patient 10/13/20 [phone]     (approximate)  I have reviewed the triage vital signs and the nursing notes.   HISTORY  Chief Complaint Nasal Congestion    HPI Shannon Hancock is a 70 y.o. female patient presents with nasal congestion and cough.  Onset of complaint while she was using fragrance cleaning house yesterday.  Patient also state husband tested positive for COVID-19.  Patient request to be tested.  Patient has taken Covid vaccine but not taken to boosters.         Past Medical History:  Diagnosis Date  . Allergy   . Anxiety   . 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 Depression   . Diabetes mellitus without complication ([place])   . Diverticulosis   . Fibromyalgia   . GERD (gastroesophageal reflux disease)   . Heart murmur   . High cholesterol   . Hypertension   . Joint pain   . Prediabetes     Patient Active Problem List   Diagnosis Date Noted  . Leukocytosis 04/19/2020  . Thyroid cyst 02/26/2018  . Plantar fasciitis 02/19/2016  . Family history of breast cancer in sister 03/10/2015  . Breast cancer of lower-outer quadrant of right female breast ([place]) 02/10/2015  . Abdominal bloating 12/16/2013  . Edema 11/09/2013  . Urge incontinence 05/05/2013  . Atrophic vaginitis 05/05/2013  . Syncope 02/15/2013  . Irritable bowel syndrome 08/20/2012  . Hearing loss 11/07/2011  . Generalized anxiety disorder 11/07/2011  . Peripheral neuropathy 07/05/2011  . Osteoporosis 07/05/2011  . Tinnitus, bilateral 04/05/2011  . HYPERCHOLESTEROLEMIA 04/05/2010  . ANEMIA, IRON DEFICIENCY 04/03/2010  . DEPRESSION, MAJOR, MODERATE 04/03/2010  . Essential hypertension 04/03/2010  . ALLERGIC RHINITIS 04/03/2010  . Chronic interstitial cystitis with hematuria  04/03/2010  . POSTMENOPAUSAL SYNDROME 04/03/2010  . 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 FIBROMYALGIA 04/03/2010    Past Surgical History:  Procedure Laterality Date  . BREAST LUMPECTOMY Right 2016  . BREAST LUMPECTOMY WITH RADIOACTIVE SEED AND SENTINEL LYMPH NODE BIOPSY Right 02/13/2015   Procedure: RIGHT BREAST LUMPECTOMY WITH RADIOACTIVE SEED AND SENTINEL LYMPH NODE MAPPING;  Surgeon: [name], MD;  Location: [facility];  Service: General;  Laterality: Right;  . FOOT SURGERY Left   . TUBAL LIGATION      Prior to Admission medications   Medication Sig Start Date End Date Taking? Authorizing Provider  amoxicillin (AMOXIL) 875 MG tablet Take 1 tablet (875 mg total) by mouth 2 (two) times daily. 10/13/20  Yes Sable Feil, PA-C  fexofenadine-pseudoephedrine (ALLEGRA-D) 60-120 MG 12 hr tablet Take 1 tablet by mouth 2 (two) times daily. 10/13/20  Yes Sable Feil, PA-C  acetaminophen (TYLENOL) 325 MG tablet Take 650 mg by mouth every 6 (six) hours as needed.    [provider]  alendronate (FOSAMAX) 70 MG tablet TAKE 1 TABLET BY MOUTH ONCE A WEEK. TAKE WITH A FULL GLASS OF WATER ON AN EMPTY STOMACH. 08/02/20   Nicholas Lose, MD  ALPRAZolam (XANAX) 1 MG tablet TAKE 1 TABLET BY MOUTH 4 TIMES A DAY AS NEEDED FOR SLEEP OR ANXIETY    Copland, Spencer, MD  amLODipine (NORVASC) 5 MG tablet Take 5 mg by mouth daily.    [provider]  anastrozole (ARIMIDEX) 1 MG tablet Take 1 tablet (1  mg total) by mouth daily. 10/11/19   Nicholas Lose, MD  budesonide-formoterol (SYMBICORT) 80-4.5 MCG/ACT inhaler Inhale 2 puffs into the lungs 2 (two) times daily.    [provider]  calamine lotion Apply 1 application topically as needed for itching. 04/24/20   LampteyMyrene Galas, MD  Cholecalciferol (VITAMIN D) 125 MCG (5000 UT) CAPS Take 1 capsule by mouth daily.    [provider]  diphenhydrAMINE (BENADRYL) 25 MG tablet Take 1 tablet (25  mg total) by mouth every 8 (eight) hours as needed. 04/24/20   Chase Picket, MD  fexofenadine (ALLEGRA) 180 MG tablet Take 180 mg by mouth daily.    [provider]  fluticasone (FLONASE) 50 MCG/ACT nasal spray Place into both nostrils as needed for allergies or rhinitis.    [provider]  Lactobacillus (PROBIOTIC ACIDOPHILUS PO) Take by mouth.    [provider]  losartan-hydrochlorothiazide (HYZAAR) 100-12.5 MG per tablet TAKE 1 TABLET BY MOUTH DAILY. 01/10/15   Bedsole, Amy E, MD  LYRICA 100 MG capsule TAKE ONE CAPSULE TWICE A DAY 06/17/13   Bedsole, Amy E, MD  magnesium oxide (MAG-OX) 400 MG tablet Take 400 mg by mouth daily.    [provider]  meloxicam (MOBIC) 7.5 MG tablet Take 1 tablet (7.5 mg total) by mouth daily. Reported on 11/15/2015 Patient taking differently: Take 7.5 mg by mouth 2 (two) times daily.  02/03/17   Nicholas Lose, MD  methocarbamol (ROBAXIN) 500 MG tablet 2 (two) times daily as needed. 08/19/19   [provider]  metoprolol succinate (TOPROL-XL) 25 MG 24 hr tablet  09/09/19   [provider]  omeprazole (PRILOSEC) 40 MG capsule omeprazole 40 mg capsule,delayed release  1 CAPSULE 30 MINUTES BEFORE MORNING MEAL ONCE A DAY ORALLY 90 DAYS    [provider]  predniSONE (STERAPRED UNI-PAK 21 TAB) 10 MG (21) TBPK tablet Take by mouth daily. Take 6 tabs by mouth daily  for 2 days, then 5 tabs for 2 days, then 4 tabs for 2 days, then 3 tabs for 2 days, 2 tabs for 2 days, then 1 tab by mouth daily for 2 days 04/24/20   Chase Picket, MD  psyllium (METAMUCIL) 0.52 g capsule Take 1 capsule (0.52 g total) by mouth daily. 03/01/19   Nicholas Lose, MD  solifenacin (VESICARE) 5 MG tablet solifenacin 5 mg tablet  TAKE 1 TABLET BY MOUTH EVERY DAY    [provider]  ZETIA 10 MG tablet TAKE 1 TABLET BY MOUTH DAILY. 06/12/14   Jinny Sanders, MD    Allergies Olive oil and Oxycodone  Family History  Problem  Relation Age of Onset  . Alzheimer's disease Mother   . Stroke Mother   . Diabetes Mother   . Hypertension Mother   . Hyperlipidemia Mother   . Depression Mother   . Anxiety disorder Mother   . Coronary artery disease Father   . Hypertension Father   . Depression Father   . Prostate cancer Father 14  . Colon polyps Father        unspecified number  . Diabetes Father   . Hyperlipidemia Father   . Heart disease Father   . Kidney disease Father   . Breast cancer Sister 52  . Colon polyps Sister        3-4 total  . Urinary tract infection Sister   . Hypertension Brother   . Hyperlipidemia Brother   . Prostate cancer Brother 38  .  Breast cancer Maternal Aunt        dx. 71s  . Kidney cancer Cousin        dx. 12s; smoker while in college  . Lung cancer Maternal Uncle        smoker  . Cancer Paternal Aunt        unspecified type  . Cirrhosis Paternal Uncle   . Colon cancer Maternal Grandmother 31  . Cancer Maternal Grandfather        unspecified type  . Parkinson's disease Maternal Grandfather   . Kidney cancer Paternal Grandfather        dx. 37s  . Healthy Daughter   . Breast cancer Cousin   . Lung cancer Other        MGM's sister  . Breast cancer Other   . Lung cancer Other   . Breast cancer Paternal Aunt 30  . Congestive Heart Failure Paternal Uncle   . Diabetes Paternal Uncle   . Healthy Daughter     Social History Social History   Tobacco Use  . Smoking status: Former Smoker    Packs/day: 0.00    Years: 4.00    Pack years: 0.00    Types: Cigarettes    Quit date: 07/01/1972    Years since quitting: 48.3  . Smokeless tobacco: Never Used  . Tobacco comment: only smoked in college -   Vaping Use  . Vaping Use: Never used  Substance Use Topics  . Alcohol use: No  . Drug use: No    Review of Systems Constitutional: No fever/chills Eyes: No visual changes. ENT: No sore throat. Cardiovascular: Denies chest pain. Respiratory: Denies shortness of  breath. Gastrointestinal: No abdominal pain.  No nausea, no vomiting.  No diarrhea.  No constipation. Genitourinary: Negative for dysuria. Musculoskeletal: Negative for back pain. Skin: Negative for rash. Neurological: Negative for headaches, focal weakness or numbness. Psychiatric:  Anxiety Endocrine:  Diabetes, hyperlipidemia, hypertension. Allergic/Immunilogical: Albuterol and oxycodone. ____________________________________________   PHYSICAL EXAM:  VITAL SIGNS: ED Triage Vitals  Enc Vitals Group     BP 10/13/20 0814 133/62     Pulse Rate 10/13/20 0814 78     Resp 10/13/20 0814 18     Temp 10/13/20 0814 98.4 F (36.9 C)     Temp Source 10/13/20 0814 Oral     SpO2 10/13/20 0814 98 %     Weight 10/13/20 0810 167 lb (75.8 kg)     Height 10/13/20 0810 5' 4"  (1.626 m)     Head Circumference --      Peak Flow --      Pain Score 10/13/20 0810 0     Pain Loc --      Pain Edu? --      Excl. in [place]? --     Constitutional: Alert and oriented. Well appearing and in no acute distress. Eyes: Conjunctivae are normal. PERRL. EOMI. Head: Atraumatic. Nose: Clear rhinorrhea.   Mouth/Throat: Mucous membranes are moist.  Oropharynx non-erythematous.  Postnasal drainage Neck: No stridor.  Hematological/Lymphatic/Immunilogical: No cervical lymphadenopathy. Cardiovascular: Normal rate, regular rhythm. Grossly normal heart sounds.  Good peripheral circulation. Respiratory: Normal respiratory effort.  No retractions. Lungs CTAB. Gastrointestinal: Soft and nontender. No distention. No abdominal bruits. No CVA tenderness. Genitourinary: Deferred Musculoskeletal: No lower extremity tenderness nor edema.  No joint effusions. Neurologic:  Normal speech and language. No gross focal neurologic deficits are appreciated. No gait instability. Skin:  Skin is warm, dry and intact. No rash noted. Psychiatric: Mood and  affect are normal. Speech and behavior are  normal.  ____________________________________________   LABS (all labs ordered are listed, but only abnormal results are displayed)  Labs Reviewed  SARS CORONAVIRUS 2 (TAT 6-24 HRS)   ____________________________________________  EKG   ____________________________________________  RADIOLOGY I, Sable Feil, personally viewed and evaluated these images (plain radiographs) as part of my medical decision making, as well as reviewing the written report by the radiologist.  ED MD interpretation: No acute findings on chest x-ray.  Official radiology report(s): DG Chest 2 View  Result Date: 10/13/2020 CLINICAL DATA:  70 year old female with nasal congestion, cough and abnormal lung auscultation. Husband positive for COVID-19 last week. EXAM: CHEST - 2 VIEW COMPARISON:  Chest radiographs 08/24/2016 and earlier. FINDINGS: Large lung volumes are stable since 2018. Mediastinal contours are stable, normal aside from tortuous descending thoracic aorta. Visualized tracheal air column is within normal limits. No pneumothorax, pulmonary edema, pleural effusion or acute pulmonary opacity. Chronic postoperative changes to the right axilla/breast. No acute osseous abnormality identified. Negative visible bowel gas pattern. IMPRESSION: No acute cardiopulmonary abnormality. Electronically Signed   By: Genevie Ann M.D.   On: 10/13/2020 08:57    ____________________________________________   PROCEDURES  Procedure(s) performed (including Critical Care):  Procedures   ____________________________________________   INITIAL IMPRESSION / ASSESSMENT AND PLAN / ED COURSE  As part of my medical decision making, I reviewed the following data within the [place]          Patient presents with nasal congestion and cough.  Patient also concerned.  Due to recent exposure to COVID-19 by her husband.  Discussed no acute findings on chest x-ray.  Advised patient COVID-19 test is also  pending.  Patient complaining physical exam consistent with upper respiratory infection.  Patient given discharge care instructions and advised self quarantine pending results of COVID-19 test.     ____________________________________________   FINAL CLINICAL IMPRESSION(S) / ED DIAGNOSES  Final diagnoses:  Upper respiratory tract infection, unspecified type     ED Discharge Orders         Ordered    fexofenadine-pseudoephedrine (ALLEGRA-D) 60-120 MG 12 hr tablet  2 times daily        10/13/20 0909    amoxicillin (AMOXIL) 875 MG tablet  2 times daily        10/13/20 6837          *Please note:  Shannon Hancock was evaluated in Emergency Department on 10/13/2020 for the symptoms described in the history of present illness. She was evaluated in the context of the global COVID-19 pandemic, which necessitated consideration that the patient might be at risk for infection with the SARS-CoV-2 virus that causes COVID-19. Institutional protocols and algorithms that pertain to the evaluation of patients at risk for COVID-19 are in a state of rapid change based on information released by regulatory bodies including the CDC and federal and state organizations. These policies and algorithms were followed during the patient's care in the ED.  Some ED evaluations and interventions may be delayed as a result of limited staffing during and the pandemic.*   Note:  This document was prepared using Dragon voice recognition software and may include unintentional dictation errors.    Sable Feil, PA-C 10/13/20 1534    Naaman Plummer, MD 10/14/20 [phone]

## 2020-10-13 NOTE — ED Notes (Signed)
See triage note  States she developed a cough yesterday  States she was around several fragrances while cleaning yesterday  No fever but husband was positive for COVID recently

## 2020-10-13 NOTE — Discharge Instructions (Signed)
Your COVID-19 test results are pending.  You may find the results later in the MyChart app.  Follow discharge care instruction take medication as directed.

## 2020-10-13 NOTE — ED Triage Notes (Signed)
Pt to ED via POV with c/o nasal congestion and cough, pt states was doing cleaning. Pt states known allergy to fragrances. Pt also requesting covid test at this time.   Pt states husband tested positive for covid last week.

## 2020-10-14 ENCOUNTER — Telehealth: Payer: Self-pay | Admitting: Unknown Physician Specialty

## 2020-10-14 NOTE — Telephone Encounter (Signed)
Called to discuss with patient about COVID-19 symptoms and the use of one of the available treatments for those with mild to moderate Covid symptoms and at a high risk of hospitalization.  Pt appears to qualify for outpatient treatment due to co-morbid conditions and/or a member of an at-risk group in accordance with the FDA Emergency Use Authorization.    Symptom onset: 4/14 Vaccinated: yes Booster? no Qualifiers: multiple  Unable to reach pt - LMOM.  No mychart   Kathrine Haddock

## 2020-10-24 ENCOUNTER — Other Ambulatory Visit: Payer: Self-pay

## 2020-10-24 ENCOUNTER — Ambulatory Visit
Admission: RE | Admit: 2020-10-24 | Discharge: 2020-10-24 | Disposition: A | Discharge status: Home / Self Care | Payer: Medicare PPO | Source: Ambulatory Visit | Attending: Hematology and Oncology | Admitting: Hematology and Oncology

## 2020-10-24 DIAGNOSIS — Z170 Estrogen receptor positive status [ER+]: Secondary | ICD-10-CM

## 2020-10-24 DIAGNOSIS — C50511 Malignant neoplasm of lower-outer quadrant of right female breast: Secondary | ICD-10-CM

## 2020-10-24 DIAGNOSIS — N6489 Other specified disorders of breast: Secondary | ICD-10-CM | POA: Diagnosis not present

## 2020-10-24 MED ORDER — GADOBUTROL 1 MMOL/ML IV SOLN
7.0000 mL | Freq: Once | INTRAVENOUS | Status: AC | PRN
  Administered 2020-10-24: 7 mL via INTRAVENOUS

## 2020-10-25 ENCOUNTER — Other Ambulatory Visit: Payer: Self-pay | Admitting: *Deleted

## 2020-10-25 DIAGNOSIS — C50511 Malignant neoplasm of lower-outer quadrant of right female breast: Secondary | ICD-10-CM

## 2020-10-25 DIAGNOSIS — Z170 Estrogen receptor positive status [ER+]: Secondary | ICD-10-CM

## 2020-11-01 DIAGNOSIS — E559 Vitamin D deficiency, unspecified: Secondary | ICD-10-CM | POA: Diagnosis not present

## 2020-11-01 DIAGNOSIS — K219 Gastro-esophageal reflux disease without esophagitis: Secondary | ICD-10-CM | POA: Diagnosis not present

## 2020-11-01 DIAGNOSIS — E785 Hyperlipidemia, unspecified: Secondary | ICD-10-CM | POA: Diagnosis not present

## 2020-11-01 DIAGNOSIS — F419 Anxiety disorder, unspecified: Secondary | ICD-10-CM | POA: Diagnosis not present

## 2020-11-01 DIAGNOSIS — E1169 Type 2 diabetes mellitus with other specified complication: Secondary | ICD-10-CM | POA: Diagnosis not present

## 2020-11-01 DIAGNOSIS — I10 Essential (primary) hypertension: Secondary | ICD-10-CM | POA: Diagnosis not present

## 2020-11-01 DIAGNOSIS — M1711 Unilateral primary osteoarthritis, right knee: Secondary | ICD-10-CM | POA: Diagnosis not present

## 2020-11-01 DIAGNOSIS — M797 Fibromyalgia: Secondary | ICD-10-CM | POA: Diagnosis not present

## 2020-11-08 DIAGNOSIS — M170 Bilateral primary osteoarthritis of knee: Secondary | ICD-10-CM | POA: Diagnosis not present

## 2020-11-14 DIAGNOSIS — M545 Low back pain, unspecified: Secondary | ICD-10-CM | POA: Diagnosis not present

## 2020-11-14 DIAGNOSIS — M79605 Pain in left leg: Secondary | ICD-10-CM | POA: Diagnosis not present

## 2020-11-25 DIAGNOSIS — M5416 Radiculopathy, lumbar region: Secondary | ICD-10-CM | POA: Diagnosis not present

## 2020-11-30 ENCOUNTER — Ambulatory Visit: Payer: Medicare PPO | Attending: Sports Medicine

## 2020-11-30 ENCOUNTER — Other Ambulatory Visit: Payer: Self-pay

## 2020-11-30 DIAGNOSIS — M5442 Lumbago with sciatica, left side: Secondary | ICD-10-CM | POA: Diagnosis not present

## 2020-11-30 DIAGNOSIS — M357 Hypermobility syndrome: Secondary | ICD-10-CM | POA: Insufficient documentation

## 2020-11-30 DIAGNOSIS — R262 Difficulty in walking, not elsewhere classified: Secondary | ICD-10-CM | POA: Diagnosis not present

## 2020-11-30 NOTE — Therapy (Signed)
[facility] [facility] [address] [place], [address] Phone: [phone]   Fax:  [phone]  Physical Therapy Evaluation  Patient Details  Name: Shannon Hancock MRN: [id number] Date of Birth: 05/23/1951 Referring Provider (PT): [name] (Sports medicine @ [facility] [place])   Encounter Date: 11/30/2020   PT End of Session - 11/30/20 1731    Visit Number 1    Number of Visits 16    Date for PT Re-Evaluation 12/28/20    Authorization Type [employer] Choice PPO    Authorization Time Period 11/30/20-01/25/21    PT Start Time 1545    PT Stop Time 1630    PT Time Calculation (min) 45 min    Activity Tolerance Patient tolerated treatment well;No increased pain    Behavior During Therapy WFL for tasks assessed/performed           Past Medical History:  Diagnosis Date  . Allergy   . Anxiety   . 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 Depression   . Diabetes mellitus without complication ([place])   . Diverticulosis   . Fibromyalgia   . GERD (gastroesophageal reflux disease)   . Heart murmur   . High cholesterol   . Hypertension   . Joint pain   . Prediabetes     Past Surgical History:  Procedure Laterality Date  . BREAST LUMPECTOMY Right 2016  . BREAST LUMPECTOMY WITH RADIOACTIVE SEED AND SENTINEL LYMPH NODE BIOPSY Right 02/13/2015   Procedure: RIGHT BREAST LUMPECTOMY WITH RADIOACTIVE SEED AND SENTINEL LYMPH NODE MAPPING;  Surgeon: [name], MD;  Location: [facility];  Service: General;  Laterality: Right;  . FOOT SURGERY Left   . TUBAL LIGATION      There were no vitals filed for this visit.    Subjective Assessment - 11/30/20 1722    Subjective Pt here for an acute low back pain and leg pain less than a week ago. Symptoms now better with medications.    Pertinent History Shannon Hancock is a 33yoF who presents to [facility] OPPT for  acute onset Low back pain with Left leg referral, referred by Vickki Hearing, Sports Medicine at [facility]. PMH: Rt knee DJD c joint stiffness. Pt reports 5 days prior having circumferential ABD pain in a ring-like distribution low back to anterior, also with pain down left medial groin/thigh to medial ankle. [facility] where she is an established patient, was diagnosed with lumbar spine pain with radiculopathy, and started on prednisone taper with thus far good pain resolution. Pt denies burning, denies paresthesia, denies frank motor weakness. Pt reports the pain was very severe and immobilizing, much worse than her chronic Rt knee pain.    How long can you sit comfortably? 15-30 minutes    How long can you stand comfortably? 5-10 minutes    How long can you walk comfortably? Able to tolerated her household AMB for ADL/IADL only at evaluation    Currently in Pain? No/denies              [facility] PT Assessment - 11/30/20 0001      Assessment   Medical Diagnosis Acute low back pain with Left leg pain    Referring Provider (PT) [name]   Sports medicine @ [facility] [place]   Onset Date/Surgical Date 11/25/20      Balance Screen   Has  the patient fallen in the past 6 months No    Has the patient had a decrease in activity level because of a fear of falling?  No    Is the patient reluctant to leave their home because of a fear of falling?  No      Home Social worker Private residence    Living Arrangements Spouse/significant other;Children   DT Rliving in home currenty   Available Help at Discharge Family    Type of [place] to enter   steep incline in driveway currently   Entrance Stairs-Number of Steps 5    Entrance Stairs-Rails [place];Able to live on main level with bedroom/bathroom   does not use 2nd level     Prior Function   Level of Independence Independent   Still able to AMB in  community ad lib despite Rt knee DJD   Vocation Retired          Symptoms detail:  -denies burning, denies paresthesias, denies motor weakness  PMH: Rt knee DJD, s/p contracture lacks full extension. (self DC from PT ~4 months ago)   AMB screening: able to perform toe walking/heel walking  Sensory: -tactile localizaton intact, sensation equal  MMT: 5/5 hamstrings and 5/5 tib anterior bilat   slump test: (-) Rt hip to tib anterior belly, normal neural tension; Lt negative ? calf/knee pain ? Unable to determine  Lumbar P/ROM testing: *performed sitting due to chronic knee limitations -seated flexion to floor, mild lordosis maintained in flexion, no pain with OP -seated extension moderate lorsosis, pain with OP (symptomatic)   *Would like to transition to [facility] after DC from our services. Wants help with determinin gHEP adn insurance use for silver sneakers.     Objective measurements completed on examination: See above findings.       PT Education - 11/30/20 1731    Education Details Educated patient on exam findings thus far and plan for continued examination upon return next visit.    Person(s) Educated Patient    Methods Explanation;Demonstration    Comprehension Verbalized understanding;Returned demonstration            PT Short Term Goals - 11/30/20 1803      PT SHORT TERM GOAL #1   Title After 4 weeks pt to demonstrate seated lumbar flexion/extension A/ROM without pain.    Baseline Eval: pain in extension    Time 4    Period Weeks    Status New    Target Date 12/28/20      PT SHORT TERM GOAL #2   Title After 4 weeks pt demonstrates ability to lift 15lb from floor without LOB or pain exacerbation to improve ability to perform household management.    Time 4    Period Weeks    Status New    Target Date 12/28/20      PT SHORT TERM GOAL #3   Title After 4 weeks pt to demonstrate 6MWT >1228f to show improved capacity to perform community based acitivity.     Baseline eval: tolerates household AMB only.    Time 4    Period Weeks    Status New    Target Date 12/28/20             PT Long Term Goals - 11/30/20 1804      PT LONG TERM GOAL #1   Title After 8 weeks pt to demonstrate  5xSTS hands free from chair in ~15sec or less without pain in low back or left leg.    Time 8    Period Weeks    Status New    Target Date 01/25/21      PT LONG TERM GOAL #2   Title After 8 weeks pt to score >55 on FOTO survey to correlate improved perception of ease in basic mobility for ADL, IADL.    Baseline 31 at eval    Time 8    Period Weeks    Status New    Target Date 01/25/21      PT LONG TERM GOAL #3   Title After 8 weeks pt to demonstrate 5/5 MMT hips and knees (excluding right knee), and SLS performance >10sec bilat to improve indepdence and safety in IADL.    Time 8    Period Weeks    Status New    Target Date 01/25/21      PT LONG TERM GOAL #4   Title After 8 weeks pt will report confidence in ability to begin an advanced HEP for DC at local gym.    Baseline eval: asking for instruction    Time 8    Period Weeks    Status New    Target Date 01/25/21                  Plan - 11/30/20 1735    Clinical Impression Statement Zamiyah Resendes is a 54yoF presents to OPPT for acute low back pain with leg pain. Examination is limited due to time restrictions, but thus far shows generalized stiffness in lumbar spine, Right lumbar paraspinal spasm. Pt has normal light touch sensation, normal tactile localization, normal ankle dorsiflexion and hamstrings strength testing. Slump test is negative Rt and inconclusive left. Pt is able to perform toe and heel walking with equal ankle excursion, no signs of neurological weakness. WIll continue with examination at next visit. Pt will benefit from skilled PT intervention to address limitations and restrictions identified in this visit as well as next visit (as they pertain to low back pain  diagnosis) in order to reduce pain, improve mobility tolerance to ADL/IADL, and improve quality of life.    Personal Factors and Comorbidities Comorbidity 1    Comorbidities severe Rt knee OA    Examination-Activity Limitations Bed Mobility;Carry;Lift;Locomotion Level;Squat;Sit    Examination-Participation Restrictions Church;Cleaning;Meal Prep;Community Activity;Laundry;Driving    Stability/Clinical Decision Making Evolving/Moderate complexity    Clinical Decision Making High    Rehab Potential Good    PT Frequency 2x / week    PT Duration 8 weeks    PT Treatment/Interventions ADLs/Self Care Home Management;Gait training;Stair training;Functional mobility training;Therapeutic activities;Therapeutic exercise;Balance training;Patient/family education;Passive range of motion    PT Next Visit Plan Passive SLR test, finish lumbar and hip ROM assessment, Manual to paraspinal spasm, HEP creation/development.    PT Home Exercise Plan defer to visit dos    Consulted and Agree with Plan of Care Patient           Patient will benefit from skilled therapeutic intervention in order to improve the following deficits and impairments:  Abnormal gait,Decreased activity tolerance,Decreased balance,Decreased endurance,Decreased knowledge of use of DME,Decreased mobility,Decreased strength,Decreased range of motion,Hypomobility,Increased muscle spasms,Impaired flexibility  Visit Diagnosis: Acute midline low back pain with left-sided sciatica  Hypermobility syndrome  Difficulty in walking, not elsewhere classified     Problem List Patient Active Problem List   Diagnosis Date Noted  . Leukocytosis 04/19/2020  .  Thyroid cyst 02/26/2018  . Plantar fasciitis 02/19/2016  . Family history of breast cancer in sister 03/10/2015  . Breast cancer of lower-outer quadrant of right female breast ([place]) 02/10/2015  . Abdominal bloating 12/16/2013  . Edema 11/09/2013  . Urge incontinence 05/05/2013  . Atrophic  vaginitis 05/05/2013  . Syncope 02/15/2013  . Irritable bowel syndrome 08/20/2012  . Hearing loss 11/07/2011  . Generalized anxiety disorder 11/07/2011  . Peripheral neuropathy 07/05/2011  . Osteoporosis 07/05/2011  . Tinnitus, bilateral 04/05/2011  . HYPERCHOLESTEROLEMIA 04/05/2010  . ANEMIA, IRON DEFICIENCY 04/03/2010  . DEPRESSION, MAJOR, MODERATE 04/03/2010  . Essential hypertension 04/03/2010  . ALLERGIC RHINITIS 04/03/2010  . 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 POSTMENOPAUSAL SYNDROME 04/03/2010  . 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 FIBROMYALGIA 04/03/2010   6:25 PM, 11/30/20 Etta Grandchild, PT, DPT Physical Therapist - [place] [phone] (Office)   Etta Grandchild 11/30/2020, 6:11 PM  [facility] [address] [place], [address] Phone: [phone]   Fax:  [phone]  Name: SHIREE ALTEMUS MRN: [id number] Date of Birth: May 19, 1951

## 2020-12-01 DIAGNOSIS — M1711 Unilateral primary osteoarthritis, right knee: Secondary | ICD-10-CM | POA: Diagnosis not present

## 2020-12-04 DIAGNOSIS — M4316 Spondylolisthesis, lumbar region: Secondary | ICD-10-CM | POA: Diagnosis not present

## 2020-12-04 DIAGNOSIS — M5416 Radiculopathy, lumbar region: Secondary | ICD-10-CM | POA: Diagnosis not present

## 2020-12-05 ENCOUNTER — Ambulatory Visit: Payer: Medicare PPO

## 2020-12-07 ENCOUNTER — Ambulatory Visit: Payer: Medicare PPO

## 2020-12-07 ENCOUNTER — Telehealth: Payer: Self-pay

## 2020-12-07 NOTE — Telephone Encounter (Signed)
No show. Called patient and left a message pertaining to appointment and a reminder for the next follow up session. Return phone call requested. Phone number ([phone]) provided.   

## 2021-01-03 DIAGNOSIS — M5459 Other low back pain: Secondary | ICD-10-CM | POA: Diagnosis not present

## 2021-01-03 DIAGNOSIS — M5416 Radiculopathy, lumbar region: Secondary | ICD-10-CM | POA: Diagnosis not present

## 2021-01-08 ENCOUNTER — Other Ambulatory Visit: Payer: Self-pay | Admitting: Chiropractic Medicine

## 2021-01-08 DIAGNOSIS — M5459 Other low back pain: Secondary | ICD-10-CM

## 2021-01-09 ENCOUNTER — Ambulatory Visit: Payer: Medicare PPO | Attending: Sports Medicine

## 2021-01-09 ENCOUNTER — Other Ambulatory Visit: Payer: Self-pay

## 2021-01-09 DIAGNOSIS — R2689 Other abnormalities of gait and mobility: Secondary | ICD-10-CM | POA: Insufficient documentation

## 2021-01-09 DIAGNOSIS — R278 Other lack of coordination: Secondary | ICD-10-CM | POA: Insufficient documentation

## 2021-01-09 DIAGNOSIS — M25562 Pain in left knee: Secondary | ICD-10-CM | POA: Insufficient documentation

## 2021-01-09 DIAGNOSIS — G8929 Other chronic pain: Secondary | ICD-10-CM | POA: Diagnosis not present

## 2021-01-09 DIAGNOSIS — M6281 Muscle weakness (generalized): Secondary | ICD-10-CM | POA: Insufficient documentation

## 2021-01-09 DIAGNOSIS — M25561 Pain in right knee: Secondary | ICD-10-CM | POA: Insufficient documentation

## 2021-01-09 NOTE — Therapy (Signed)
[place] MAIN [facility] SERVICES [address] [place], [place], [address] Phone: [phone]   Fax:  [phone]  Physical Therapy Treatment/RECERT  Patient Details  Name: Shannon Hancock MRN: [id number] Date of Birth: [birth year] Referring Provider (PT): [name] (Sports medicine @ [facility] [place])   Encounter Date: 01/09/2021   PT End of Session - 01/09/21 1725     Visit Number 2    Number of Visits 40    Date for PT Re-Evaluation 04/03/21    Authorization Type [employer] Choice PPO    Authorization Time Period 7/0/17-7/93/90; new cert for 3/00-92/3    PT Start Time 0810    PT Stop Time 0858    PT Time Calculation (min) 48 min    Equipment Utilized During Treatment Gait belt    Activity Tolerance Patient tolerated treatment well;No increased pain    Behavior During Therapy [facility] for tasks assessed/performed             Past Medical History:  Diagnosis Date   Allergy    Anxiety    Breast cancer ([place]) 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; ER/PR+, Her2-, 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%   Depression    Diabetes mellitus without complication ([facility])    Diverticulosis    Fibromyalgia    GERD (gastroesophageal reflux disease)    Heart murmur    High cholesterol    Hypertension    Joint pain    Prediabetes     Past Surgical History:  Procedure Laterality Date   BREAST LUMPECTOMY Right 2016   BREAST LUMPECTOMY WITH RADIOACTIVE SEED AND SENTINEL LYMPH NODE BIOPSY Right 02/13/2015   Procedure: RIGHT BREAST LUMPECTOMY WITH RADIOACTIVE SEED AND SENTINEL LYMPH NODE MAPPING;  Surgeon: [name], MD;  Location: [place];  Service: General;  Laterality: Right;   FOOT SURGERY Left    TUBAL LIGATION      There were no vitals filed for this visit.   Subjective Assessment - 01/09/21 0814     Subjective Pt presents to PT session after long absence. Pt reports reason for absence is she was switching locations to this clinic and had  to wait for available appointment time. The pt currently has B knee pain and back pain. She says her back pain is currently a 9-10/10 and describes it as a soreness. She says this morning has been particularly bad for her pain. She says at rest, her knee pain is OK. She reports as soon as she gets up and takes "20-30 steps" her knees start to hurt and "doesn't stop hurting until I sit down." She reports using ice provides some pain relief. Pt reports prednisone and injections helped her pain for a few months. Pt reports puffiness developed in face and abdomen and states, "I am puffy all over." She says this started with taking prednisone but did not go away once prednisone stopped.    Pertinent History Shannon Hancock is a 4yoF who presents to [facility] OPPT for acute onset Low back pain with Left leg referral, referred by Vickki Hearing, Sports Medicine at [facility]. PMH: Rt knee DJD c joint stiffness. Pt reports 5 days prior having circumferential ABD pain in a ring-like distribution low back to anterior, also with pain down left medial groin/thigh to medial ankle. [facility] went to [facility] where she is an established patient, was diagnosed with lumbar spine pain with radiculopathy, and started on prednisone taper with thus far good pain resolution. Pt denies  burning, denies paresthesia, denies frank motor weakness. Pt reports the pain was very severe and immobilizing, much worse than her chronic Rt knee pain.    How long can you sit comfortably? 15-30 minutes    How long can you stand comfortably? 5-10 minutes    How long can you walk comfortably? Able to tolerated her household AMB for ADL/IADL only at evaluation    Currently in Pain? Yes    Pain Score 9     Pain Location Back    Pain Orientation Lower    Pain Onset More than a month ago            TREATMENT Reassessment for RECERT  MMT: grossly 3+/5   Sensation testing: sensation slightly decreased on R compared to LLE, otherwise pt reports  able to feel light-touch in BLEs.   ROM: R knee ext. ROM: lacing 20 deg from neutral  5xSTS: 20 sec, use of hands to assist, some pain felt in lateral aspect of legs  Seated piriformis stretch 60 sec BLEs. Reports tightness.   Seated thoracic extension 2x20  Seated spinal flexion 5x.   Observation:  Transfer- takes increased time, uses B Ues to assist.  Brief gait observation - Pt with difficulty lifting BLEs to take steps. Reports sensation of heaviness in BLEs.     PT instructs pt in new HEP: Access Code: [email or web address] URL: [email or web address] Date: 01/09/2021 Prepared by: Ricard Dillon  Exercises Seated Piriformis Stretch - 1 x daily - 7 x weekly - 1 sets - 2 reps - 30 hold    Assessment: Pt presents to PT after prolonged absence d/t needing to change locations for appointments. Reassessment/RECERT performed where pt with gross decrease in BLE strength, impaired LE ROM, and decreased LE strength and power. Pt 5xSTS performance indicates pt is at an increased risk for falls.  Per pt reports and upon reassessment pt mobility is also limited d/t LBP as well as BLE pain where LLE pain>RLE pain. PT issued seated piriformis stretch this session to begin to address mobility limitations. D/t reported "puffiness" and appearance of abdominal distension, PT advised pt to contact her doctor regarding this. Pt verbalized understanding. The pt will benefit from further skilled PT to improve pain, general strength, gait and functional mobility to increase ease and safety with ADLs and to reduce fall risk.      PT Education - 01/09/21 1724     Education Details Education on further assessment findings, indications for PT/POC    Person(s) Educated Patient    Methods Explanation;Verbal cues;Demonstration;Handout    Comprehension Verbalized understanding;Returned demonstration;Need further instruction              PT Short Term Goals - 01/10/21 1318       PT SHORT TERM GOAL  #1   Title After 4 weeks pt to demonstrate seated lumbar flexion/extension A/ROM without pain.    Baseline Eval: pain in extension; 7/12: pt continues to have back pain with ext.    Time 4    Period Weeks    Status On-going    Target Date 02/20/21      PT SHORT TERM GOAL #2   Title After 4 weeks pt demonstrates ability to lift 15lb from floor without LOB or pain exacerbation to improve ability to perform household management.    Baseline 7/12: deferred    Time 4    Period Weeks    Status On-going    Target Date 02/20/21      PT  SHORT TERM GOAL #3   Title After 4 weeks pt to demonstrate 6MWT >124ft to show improved capacity to perform community based acitivity.    Baseline eval: tolerates household AMB only. 7/12: deferred d/t time    Time 4    Period Weeks    Status New    Target Date 02/20/21               PT Long Term Goals - 01/10/21 1319       PT LONG TERM GOAL #1   Title After 8 weeks pt to demonstrate 5xSTS hands free from chair in ~15sec or less without pain in low back or left leg.    Baseline 7/12: 20 sec with use of hands to assist    Time 12    Period Weeks    Status On-going    Target Date 04/03/21      PT LONG TERM GOAL #2   Title After 8 weeks pt to score >55 on FOTO survey to correlate improved perception of ease in basic mobility for ADL, IADL.    Baseline 31 at eval; 7/21: to be taken next session    Time 12    Period Weeks    Status On-going    Target Date 04/03/21      PT LONG TERM GOAL #3   Title After 8 weeks pt to demonstrate 5/5 MMT hips and knees (excluding right knee), and SLS performance >10sec bilat to improve indepdence and safety in IADL.    Baseline 7/12: BLEs grossly 3+/5 with pain limitations    Time 12    Period Weeks    Status On-going    Target Date 04/03/21      PT LONG TERM GOAL #4   Title After 8 weeks pt will report confidence in ability to begin an advanced HEP for DC at local gym.    Baseline eval: asking for  instruction; 7/12: HEP issued    Time 12    Period Weeks    Status On-going    Target Date 04/03/21                   Plan - 01/09/21 1726     Clinical Impression Statement Pt presents to PT after prolonged absence d/t needing to change locations for appointments. Reassessment/RECERT performed where pt with gross decrease in BLE strength, impaired LE ROM, and decreased LE strength and power. Pt 5xSTS performance indicates pt is at an increased risk for falls.  Per pt reports and upon reassessment pt mobility is also limited d/t LBP as well as BLE pain where LLE pain>RLE pain. PT issued seated piriformis stretch this session to begin to address mobility limitations. D/t reported "puffiness" and appearance of abdominal distension, PT advised pt to contact her doctor regarding this. Pt verbalized understanding. The pt will benefit from further skilled PT to improve pain, general strength, gait and functional mobility to increase ease and safety with ADLs and to reduce fall risk.    Personal Factors and Comorbidities Comorbidity 1    Comorbidities severe Rt knee OA    Examination-Activity Limitations Bed Mobility;Carry;Lift;Locomotion Level;Squat;Sit    Examination-Participation Restrictions Church;Cleaning;Meal Prep;Community Activity;Laundry;Driving    Stability/Clinical Decision Making Evolving/Moderate complexity    Rehab Potential Good    PT Frequency 2x / week    PT Duration 12 weeks    PT Treatment/Interventions ADLs/Self Care Home Management;Gait training;Stair training;Functional mobility training;Therapeutic activities;Therapeutic exercise;Balance training;Patient/family education;Passive range of motion    PT  Next Visit Plan Passive SLR test, finish lumbar and hip ROM assessment, Manual to paraspinal spasm, HEP creation/development, endurance, strengthening, ROM and balance    PT Home Exercise Plan initial HEP Access Code: [email or web address]    Consulted and Agree with Plan of Care  Patient             Patient will benefit from skilled therapeutic intervention in order to improve the following deficits and impairments:  Abnormal gait, Decreased activity tolerance, Decreased balance, Decreased endurance, Decreased knowledge of use of DME, Decreased mobility, Decreased strength, Decreased range of motion, Hypomobility, Increased muscle spasms, Impaired flexibility  Visit Diagnosis: Muscle weakness (generalized)  Other abnormalities of gait and mobility  Other lack of coordination  Chronic pain of left knee  Chronic pain of right knee     Problem List Patient Active Problem List   Diagnosis Date Noted   Leukocytosis 04/19/2020   Thyroid cyst 02/26/2018   Plantar fasciitis 02/19/2016   Family history of breast cancer in sister 03/10/2015   Breast cancer of lower-outer quadrant of right female breast ([place]) 02/10/2015   Abdominal bloating 12/16/2013   Edema 11/09/2013   Urge incontinence 05/05/2013   Atrophic vaginitis 05/05/2013   Syncope 02/15/2013   Irritable bowel syndrome 08/20/2012   Hearing loss 11/07/2011   Generalized anxiety disorder 11/07/2011   Peripheral neuropathy 07/05/2011   Osteoporosis 07/05/2011   Tinnitus, bilateral 04/05/2011   HYPERCHOLESTEROLEMIA 04/05/2010   ANEMIA, IRON DEFICIENCY 04/03/2010   DEPRESSION, MAJOR, MODERATE 04/03/2010   Essential hypertension 04/03/2010   ALLERGIC RHINITIS 04/03/2010   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/03/2010   POSTMENOPAUSAL SYNDROME 04/03/2010   DEGENERATIVE JOINT DISEASE, CERVICAL SPINE 04/03/2010   FIBROMYALGIA 04/03/2010   Ricard Dillon PT, DPT  01/10/2021, 1:29 PM  [facility] MAIN [facility] SERVICES [address] [place], [place], [address] Phone: [phone]   Fax:  [phone]  Name: OLUWAKEMI SALSBERRY MRN: [id number] Date of Birth: Jan 04, 1951

## 2021-01-11 ENCOUNTER — Other Ambulatory Visit: Payer: Self-pay

## 2021-01-11 ENCOUNTER — Ambulatory Visit: Payer: Medicare PPO

## 2021-01-11 DIAGNOSIS — M6281 Muscle weakness (generalized): Secondary | ICD-10-CM

## 2021-01-11 DIAGNOSIS — R278 Other lack of coordination: Secondary | ICD-10-CM | POA: Diagnosis not present

## 2021-01-11 DIAGNOSIS — G8929 Other chronic pain: Secondary | ICD-10-CM | POA: Diagnosis not present

## 2021-01-11 DIAGNOSIS — M25561 Pain in right knee: Secondary | ICD-10-CM | POA: Diagnosis not present

## 2021-01-11 DIAGNOSIS — R2689 Other abnormalities of gait and mobility: Secondary | ICD-10-CM | POA: Diagnosis not present

## 2021-01-11 DIAGNOSIS — M25562 Pain in left knee: Secondary | ICD-10-CM | POA: Diagnosis not present

## 2021-01-11 NOTE — Therapy (Signed)
[place] MAIN [facility] SERVICES [address] [place], [place], [address] Phone: [phone]   Fax:  [phone]  Physical Therapy Treatment  Patient Details  Name: Shannon Hancock MRN: [id number] Date of Birth: 1950-09-19 Referring Provider (PT): [name] (Sports medicine @ [facility] [place])   Encounter Date: 01/11/2021   PT End of Session - 01/11/21 0810     Visit Number 3    Number of Visits 40    Date for PT Re-Evaluation 04/03/21    Authorization Type [employer] PPO    Authorization Time Period 12/02/82-5/36/46; new cert for 8/03-21/2    PT Start Time 0802    PT Stop Time 0845    PT Time Calculation (min) 43 min    Equipment Utilized During Treatment Gait belt    Activity Tolerance Patient tolerated treatment well;No increased pain    Behavior During Therapy [facility] for tasks assessed/performed             Past Medical History:  Diagnosis Date   Allergy    Anxiety    Breast cancer ([place]) 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; ER/PR+, Her2-, 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%   Depression    Diabetes mellitus without complication (HCC)    Diverticulosis    Fibromyalgia    GERD (gastroesophageal reflux disease)    Heart murmur    High cholesterol    Hypertension    Joint pain    Prediabetes     Past Surgical History:  Procedure Laterality Date   BREAST LUMPECTOMY Right 2016   BREAST LUMPECTOMY WITH RADIOACTIVE SEED AND SENTINEL LYMPH NODE BIOPSY Right 02/13/2015   Procedure: RIGHT BREAST LUMPECTOMY WITH RADIOACTIVE SEED AND SENTINEL LYMPH NODE MAPPING;  Surgeon: [name], MD;  Location: [facility];  Service: General;  Laterality: Right;   FOOT SURGERY Left    TUBAL LIGATION      There were no vitals filed for this visit.   Subjective Assessment - 01/11/21 0807     Subjective Pt reports she has done more than usual since last session and she is feeling a little better. She had help with stretching. She reports  she has been taking care of her sister who was sick and she feels she stopped taking care of herself so much in order to help her sister.    Pertinent History Shannon Hancock is a 59yoF who presents to [facility] for acute onset Low back pain with Left leg referral, referred by Vickki Hearing, Sports Medicine at [facility]. PMH: Rt knee DJD c joint stiffness. Pt reports 5 days prior having circumferential ABD pain in a ring-like distribution low back to anterior, also with pain down left medial groin/thigh to medial ankle. Pt went to [facility] where she is an established patient, was diagnosed with lumbar spine pain with radiculopathy, and started on prednisone taper with thus far good pain resolution. Pt denies burning, denies paresthesia, denies frank motor weakness. Pt reports the pain was very severe and immobilizing, much worse than her chronic Rt knee pain.    How long can you sit comfortably? 15-30 minutes    How long can you stand comfortably? 5-10 minutes    How long can you walk comfortably? Able to tolerated her household AMB for ADL/IADL only at evaluation    Currently in Pain? Yes    Pain Location Back    Pain Orientation Lower    Pain Onset More than a month ago  TREATMENT  Nustep level 0 x 5 min (unbilled)   6MWT - 675 ft   Supine PT-assisted hamstring stretches 60-90 sec B  Knee to chest - 10x each LE  Lower trunk rotations 2x20  Glute bridges 12x  SLR 11x each LE; pt rates hard  A: Further testing completed on this date. Pt 6MWT indicates impairment in gait ability, functional capacity and that pt is below community ambulator status. PT introduced supine strengthening where pt was challenged with all therex. The pt will benefit from further skilled PT to improve endurance, gait ability/mechanics and strength to increase ease and safety with all functional mobility.   HEP: supine glute bridges 3x10 for 4x/week.    PT Short Term Goals - 01/10/21 1318        PT SHORT TERM GOAL #1   Title After 4 weeks pt to demonstrate seated lumbar flexion/extension A/ROM without pain.    Baseline Eval: pain in extension; 7/12: pt continues to have back pain with ext.    Time 4    Period Weeks    Status On-going    Target Date 02/20/21      PT SHORT TERM GOAL #2   Title After 4 weeks pt demonstrates ability to lift 15lb from floor without LOB or pain exacerbation to improve ability to perform household management.    Baseline 7/12: deferred    Time 4    Period Weeks    Status On-going    Target Date 02/20/21      PT SHORT TERM GOAL #3   Title After 4 weeks pt to demonstrate 6MWT >1233f to show improved capacity to perform community based acitivity.    Baseline eval: tolerates household AMB only. 7/12: deferred d/t time    Time 4    Period Weeks    Status New    Target Date 02/20/21               PT Long Term Goals - 01/10/21 1319       PT LONG TERM GOAL #1   Title After 8 weeks pt to demonstrate 5xSTS hands free from chair in ~15sec or less without pain in low back or left leg.    Baseline 7/12: 20 sec with use of hands to assist    Time 12    Period Weeks    Status On-going    Target Date 04/03/21      PT LONG TERM GOAL #2   Title After 8 weeks pt to score >55 on FOTO survey to correlate improved perception of ease in basic mobility for ADL, IADL.    Baseline 31 at eval; 7/21: to be taken next session    Time 12    Period Weeks    Status On-going    Target Date 04/03/21      PT LONG TERM GOAL #3   Title After 8 weeks pt to demonstrate 5/5 MMT hips and knees (excluding right knee), and SLS performance >10sec bilat to improve indepdence and safety in IADL.    Baseline 7/12: BLEs grossly 3+/5 with pain limitations    Time 12    Period Weeks    Status On-going    Target Date 04/03/21      PT LONG TERM GOAL #4   Title After 8 weeks pt will report confidence in ability to begin an advanced HEP for DC at local gym.     Baseline eval: asking for instruction; 7/12: HEP issued    Time  12    Period Weeks    Status On-going    Target Date 04/03/21                   Plan - 01/11/21 4961     Clinical Impression Statement Further testing completed on this date. Pt 6MWT indicates impairment in gait ability, functional capacity and that pt is below community ambulator status. PT introduced supine strengthening where pt was challenged with all therex. The pt will benefit from further skilled PT to improve endurance, gait ability/mechanics and strength to increase ease and safety with all functional mobility.    Personal Factors and Comorbidities Comorbidity 1    Comorbidities severe Rt knee OA    Examination-Activity Limitations Bed Mobility;Carry;Lift;Locomotion Level;Squat;Sit    Examination-Participation Restrictions Church;Cleaning;Meal Prep;Community Activity;Laundry;Driving    Stability/Clinical Decision Making Evolving/Moderate complexity    Rehab Potential Good    PT Frequency 2x / week    PT Duration 12 weeks    PT Treatment/Interventions ADLs/Self Care Home Management;Gait training;Stair training;Functional mobility training;Therapeutic activities;Therapeutic exercise;Balance training;Patient/family education;Passive range of motion    PT Next Visit Plan Passive SLR test, finish lumbar and hip ROM assessment, Manual to paraspinal spasm, HEP creation/development, endurance, strengthening, ROM and balance    PT Home Exercise Plan initial HEP Access Code: [email or web address]; HEP: supine glute bridges 3x10 for 4x/week.    Consulted and Agree with Plan of Care Patient             Patient will benefit from skilled therapeutic intervention in order to improve the following deficits and impairments:  Abnormal gait, Decreased activity tolerance, Decreased balance, Decreased endurance, Decreased knowledge of use of DME, Decreased mobility, Decreased strength, Decreased range of motion, Hypomobility, Increased  muscle spasms, Impaired flexibility  Visit Diagnosis: Muscle weakness (generalized)  Other abnormalities of gait and mobility  Other lack of coordination     Problem List Patient Active Problem List   Diagnosis Date Noted   Leukocytosis 04/19/2020   Thyroid cyst 02/26/2018   Plantar fasciitis 02/19/2016   Family history of breast cancer in sister 03/10/2015   Breast cancer of lower-outer quadrant of right female breast ([place]) 02/10/2015   Abdominal bloating 12/16/2013   Edema 11/09/2013   Urge incontinence 05/05/2013   Atrophic vaginitis 05/05/2013   Syncope 02/15/2013   Irritable bowel syndrome 08/20/2012   Hearing loss 11/07/2011   Generalized anxiety disorder 11/07/2011   Peripheral neuropathy 07/05/2011   Osteoporosis 07/05/2011   Tinnitus, bilateral 04/05/2011   HYPERCHOLESTEROLEMIA 04/05/2010   ANEMIA, IRON DEFICIENCY 04/03/2010   DEPRESSION, MAJOR, MODERATE 04/03/2010   Essential hypertension 04/03/2010   ALLERGIC RHINITIS 04/03/2010   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/03/2010   POSTMENOPAUSAL SYNDROME 04/03/2010   DEGENERATIVE JOINT DISEASE, CERVICAL SPINE 04/03/2010   FIBROMYALGIA 04/03/2010   Ricard Dillon PT, DPT 01/11/2021, 9:44 AM  [place] MAIN [facility] SERVICES [address] [place], [place], [address] Phone: [phone]   Fax:  [phone]  Name: BATOOL MAJID MRN: [id number] Date of Birth: 09/28/1950

## 2021-01-16 ENCOUNTER — Ambulatory Visit: Payer: Medicare PPO

## 2021-01-18 ENCOUNTER — Ambulatory Visit: Payer: Medicare PPO

## 2021-01-19 ENCOUNTER — Ambulatory Visit
Admission: RE | Admit: 2021-01-19 | Discharge: 2021-01-19 | Disposition: A | Discharge status: Home / Self Care | Payer: Medicare PPO | Source: Ambulatory Visit | Attending: Chiropractic Medicine | Admitting: Chiropractic Medicine

## 2021-01-19 DIAGNOSIS — M545 Low back pain, unspecified: Secondary | ICD-10-CM | POA: Diagnosis not present

## 2021-01-19 DIAGNOSIS — M5459 Other low back pain: Secondary | ICD-10-CM

## 2021-01-19 DIAGNOSIS — M48061 Spinal stenosis, lumbar region without neurogenic claudication: Secondary | ICD-10-CM | POA: Diagnosis not present

## 2021-01-23 ENCOUNTER — Ambulatory Visit: Payer: Medicare PPO

## 2021-01-24 DIAGNOSIS — M5416 Radiculopathy, lumbar region: Secondary | ICD-10-CM | POA: Diagnosis not present

## 2021-01-25 ENCOUNTER — Ambulatory Visit: Payer: Medicare PPO

## 2021-01-30 ENCOUNTER — Ambulatory Visit: Payer: Medicare PPO | Attending: Sports Medicine

## 2021-01-30 DIAGNOSIS — M5416 Radiculopathy, lumbar region: Secondary | ICD-10-CM | POA: Diagnosis not present

## 2021-02-01 ENCOUNTER — Ambulatory Visit: Payer: Medicare PPO

## 2021-02-06 ENCOUNTER — Ambulatory Visit: Payer: Medicare PPO

## 2021-02-08 ENCOUNTER — Ambulatory Visit: Payer: Medicare PPO

## 2021-02-09 DIAGNOSIS — I10 Essential (primary) hypertension: Secondary | ICD-10-CM | POA: Diagnosis not present

## 2021-02-09 DIAGNOSIS — E1169 Type 2 diabetes mellitus with other specified complication: Secondary | ICD-10-CM | POA: Diagnosis not present

## 2021-02-09 DIAGNOSIS — M1711 Unilateral primary osteoarthritis, right knee: Secondary | ICD-10-CM | POA: Diagnosis not present

## 2021-02-09 DIAGNOSIS — R140 Abdominal distension (gaseous): Secondary | ICD-10-CM | POA: Diagnosis not present

## 2021-02-09 DIAGNOSIS — Z01818 Encounter for other preprocedural examination: Secondary | ICD-10-CM | POA: Diagnosis not present

## 2021-02-09 DIAGNOSIS — E785 Hyperlipidemia, unspecified: Secondary | ICD-10-CM | POA: Diagnosis not present

## 2021-02-19 DIAGNOSIS — M545 Low back pain, unspecified: Secondary | ICD-10-CM | POA: Diagnosis not present

## 2021-02-26 DIAGNOSIS — M5416 Radiculopathy, lumbar region: Secondary | ICD-10-CM | POA: Diagnosis not present

## 2021-02-27 NOTE — Progress Notes (Signed)
Patient Care Team: [name], MD as PCP - General [name], MD as Consulting Physician (General Surgery) Kyung Rudd, MD as Consulting Physician (Radiation Oncology) Sylvan Cheese, NP as Nurse Practitioner (Hematology and Oncology) Nicholas Lose, MD as Consulting Physician (Hematology and Oncology)  DIAGNOSIS:    ICD-10-CM   1. Malignant neoplasm of lower-outer quadrant of right breast of female, estrogen receptor positive ([place])  C50.511    Z17.0       SUMMARY OF ONCOLOGIC HISTORY: Oncology History Overview Note  Breast cancer of lower-outer quadrant of right female breast   Staging form: Breast, AJCC 7th Edition     Clinical: Stage IA (T1b, N0, M0) - Unsigned     Pathologic stage from 02/13/2015: Stage IB (T1b, N54m, cM0) - Unsigned      Breast cancer of lower-outer quadrant of right female breast [place]  01/05/2015 Mammogram   Right breast: possible mass warranting further evaluation   01/05/2015 Breast MRI   10 mm diameter enhancing mass in the lower outer right breast suspicious for malignancy   01/18/2015 Mammogram   Diagnostic mammo and ultrasound showed a 0.6 cm mass in the right breast 8:00 location 6 cm from the nipple, and a 0.5 cm lesion in the 10:00 location of the right breast 6 cm from the nipple. Right axilla was negative for adenopathy.   01/19/2015 Initial Biopsy   Right breast 8:00 mass biopsy showed invasive ductal carcinoma and DCIS, grade 1-2, the 10:00 mass biopsy showed fibrocystic change.ER 100% positive, PR 90% positive, HER-2 negative, Ki-67 10%   01/19/2015 Clinical Stage   Stage IA: T1b N0   02/13/2015 Definitive Surgery   Right breast lumpectomy: margins negative; Invasive ductal carcinoma, G1, 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 1 out of 2 lymph nodes were positive for micrometastasis.   02/13/2015 Oncotype testing   RS 12 (8% ROR)   02/13/2015 Pathologic Stage   Stage IB: T1b N1(mi)   03/10/2015 Procedure    Breast/Ovarian Panel (Gene Dx): no clinically significant variant at ATM, BARD1, BRCA1, BRCA2, BRIP1, CDH1, CHEK2, FANCC, MLH1, MSH2, MSH6, NBN, PALB2, PMS2, PTEN, RAD51C, RAD51D, TP53, and XRCC2.     03/20/2015 - 04/14/2015 Radiation Therapy   Adjuvant XRT ([facility]: 42.5 Gy over 17 fractions to the breast using whole-breast tangent fields. Right breast boost 7.5 Gy over 3 fractions. Total dose: 50 Gy   04/19/2015 -  Anti-estrogen oral therapy   Anastrozole 1 mg daily. Planned duration of therapy 5-10 years. Breast cancer index (03/20/20) revealed chance of distant recurrence is 1.2%, no benefit of continued antiestrogen therapy.   07/05/2015 Survivorship   Survivorship care plan completed and mailed to patient in lieu of in person visit.   11/15/2015 Breast MRI   No evidence of breast malignancy. Anterior and lateral skin thickening consistent with radiation change     CHIEF COMPLIANT: Follow-up of right breast cancer and leukocytosis  INTERVAL HISTORY: Shannon SCHILDis a 70y.o. with above-mentioned history of right breast cancer treated with lumpectomy, radiation, and who is currently on anastrozole. She also has a history of leukocytosis. Mammogram on 10/24/2020 showed no evidence of malignancy bilaterally. She presents to the clinic today for follow-up.   ALLERGIES:  is allergic to olive oil and oxycodone.  MEDICATIONS:  Current Outpatient Medications  Medication Sig Dispense Refill   acetaminophen (TYLENOL) 325 MG tablet Take 650 mg by mouth every 6 (six) hours as needed.     alendronate (FOSAMAX) 70 MG tablet TAKE  1 TABLET BY MOUTH ONCE A WEEK. TAKE WITH A FULL GLASS OF WATER ON AN EMPTY STOMACH. 12 tablet 3   ALPRAZolam (XANAX) 1 MG tablet TAKE 1 TABLET BY MOUTH 4 TIMES A DAY AS NEEDED FOR SLEEP OR ANXIETY 120 tablet 0   amLODipine (NORVASC) 5 MG tablet Take 5 mg by mouth daily.     amoxicillin (AMOXIL) 875 MG tablet Take 1 tablet (875 mg total) by mouth 2 (two) times daily. 20  tablet 0   anastrozole (ARIMIDEX) 1 MG tablet Take 1 tablet (1 mg total) by mouth daily. 90 tablet 3   budesonide-formoterol (SYMBICORT) 80-4.5 MCG/ACT inhaler Inhale 2 puffs into the lungs 2 (two) times daily.     calamine lotion Apply 1 application topically as needed for itching. 120 mL 0   Cholecalciferol (VITAMIN D) 125 MCG (5000 UT) CAPS Take 1 capsule by mouth daily.     diphenhydrAMINE (BENADRYL) 25 MG tablet Take 1 tablet (25 mg total) by mouth every 8 (eight) hours as needed. 30 tablet 0   fexofenadine (ALLEGRA) 180 MG tablet Take 180 mg by mouth daily.     fexofenadine-pseudoephedrine (ALLEGRA-D) 60-120 MG 12 hr tablet Take 1 tablet by mouth 2 (two) times daily. 20 tablet 0   fluticasone (FLONASE) 50 MCG/ACT nasal spray Place into both nostrils as needed for allergies or rhinitis.     Lactobacillus (PROBIOTIC ACIDOPHILUS PO) Take by mouth.     losartan-hydrochlorothiazide (HYZAAR) 100-12.5 MG per tablet TAKE 1 TABLET BY MOUTH DAILY. 30 tablet 0   LYRICA 100 MG capsule TAKE ONE CAPSULE TWICE A DAY 60 capsule 3   magnesium oxide (MAG-OX) 400 MG tablet Take 400 mg by mouth daily.     meloxicam (MOBIC) 7.5 MG tablet Take 1 tablet (7.5 mg total) by mouth daily. Reported on 11/15/2015 (Patient taking differently: Take 7.5 mg by mouth 2 (two) times daily. )     methocarbamol (ROBAXIN) 500 MG tablet 2 (two) times daily as needed.     metoprolol succinate (TOPROL-XL) 25 MG 24 hr tablet      omeprazole (PRILOSEC) 40 MG capsule omeprazole 40 mg capsule,delayed release  1 CAPSULE 30 MINUTES BEFORE MORNING MEAL ONCE A DAY ORALLY 90 DAYS     predniSONE (STERAPRED UNI-PAK 21 TAB) 10 MG (21) TBPK tablet Take by mouth daily. Take 6 tabs by mouth daily  for 2 days, then 5 tabs for 2 days, then 4 tabs for 2 days, then 3 tabs for 2 days, 2 tabs for 2 days, then 1 tab by mouth daily for 2 days 42 tablet 0   psyllium (METAMUCIL) 0.52 g capsule Take 1 capsule (0.52 g total) by mouth daily.     solifenacin  (VESICARE) 5 MG tablet solifenacin 5 mg tablet  TAKE 1 TABLET BY MOUTH EVERY DAY     ZETIA 10 MG tablet TAKE 1 TABLET BY MOUTH DAILY. 90 tablet 1   No current facility-administered medications for this visit.    PHYSICAL EXAMINATION: ECOG PERFORMANCE STATUS: 1 - Symptomatic but completely ambulatory  There were no vitals filed for this visit. There were no vitals filed for this visit.  BREAST: No palpable masses or nodules in either right or left breasts. No palpable axillary supraclavicular or infraclavicular adenopathy no breast tenderness or nipple discharge. (exam performed in the presence of a chaperone)  LABORATORY DATA:  I have reviewed the data as listed CMP Latest Ref Rng & Units 04/18/2020 07/27/2019 03/03/2019  Glucose 65 - 99 mg/dL -  79 -  BUN 8 - 27 mg/dL - 18 -  Creatinine 0.44 - 1.00 mg/dL 0.90 0.97 1.00  Sodium 134 - 144 mmol/L - 141 -  Potassium 3.5 - 5.2 mmol/L - 4.4 -  Chloride 96 - 106 mmol/L - 101 -  CO2 20 - 29 mmol/L - 24 -  Calcium 8.7 - 10.3 mg/dL - 10.1 -  Total Protein 6.0 - 8.5 g/dL - 6.9 -  Total Bilirubin 0.0 - 1.2 mg/dL - 0.2 -  Alkaline Phos 39 - 117 IU/L - 83 -  AST 0 - 40 IU/L - 23 -  ALT 0 - 32 IU/L - 24 -    Lab Results  Component Value Date   WBC 6.4 07/27/2019   HGB 11.6 07/27/2019   HCT 36.7 07/27/2019   MCV 83 07/27/2019   PLT 208 07/27/2019   NEUTROABS 3.4 07/27/2019    ASSESSMENT & PLAN:  Breast cancer of lower-outer quadrant of right female breast Right breast lumpectomy 02/13/2015: margins negative; Invasive ductal carcinoma, G1, 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 1 out of 2 lymph nodes were positive for micrometastasis.Oncotype 12 (8% ROR) T1b N1 mic stage IB XRT from 03/20/15 to 04/14/2015   Current treatment: Anastrozole 1 mg daily started November 2016 switched to letrozole 03/01/2019 Letrozole toxicities: 1.  Diffuse arthralgias and myalgias: Doing better since she got injections in her knees 2.  Osteopenia: Bone  density 04/23/2018: T score -1.5: Continue with calcium and vitamin D and Fosamax and weightbearing exercises.  We will obtain a new bone density test in October of this year 3.  Weight gain: Monitoring   Breast cancer index 03/20/2020: No benefit from extended adjuvant therapy, risk of distant recurrence 1.2% Discontinued in 2021   Breast cancer surveillance: 1.  Breast exam 02/28/2021: Benign 2.  Mammogram 02/25/2020: Benign breast density category C 3.  Breast MRI 10/24/2020: No evidence of malignancy, breast density category C   CT abdomen pelvis: Small cystocele, no evidence of bone abnormalities.  Foraminal narrowing L5-S1 Patient is planning to undergo knee replacement surgery next month.  Return to clinic in 1 year for follow-up    No orders of the defined types were placed in this encounter.  The patient has a good understanding of the overall plan. she agrees with it. she will call with any problems that may develop before the next visit here.  Total time spent: 20 mins including face to face time and time spent for planning, charting and coordination of care  Rulon Eisenmenger, MD, MPH 02/28/2021  I, Thana Ates, am acting as scribe for Dr. Nicholas Lose.  I have reviewed the above documentation for accuracy and completeness, and I agree with the above.

## 2021-02-28 ENCOUNTER — Other Ambulatory Visit: Payer: Self-pay

## 2021-02-28 ENCOUNTER — Inpatient Hospital Stay: Payer: Medicare PPO | Attending: Hematology and Oncology | Admitting: Hematology and Oncology

## 2021-02-28 DIAGNOSIS — Z79811 Long term (current) use of aromatase inhibitors: Secondary | ICD-10-CM | POA: Insufficient documentation

## 2021-02-28 DIAGNOSIS — Z79899 Other long term (current) drug therapy: Secondary | ICD-10-CM | POA: Insufficient documentation

## 2021-02-28 DIAGNOSIS — N8110 Cystocele, unspecified: Secondary | ICD-10-CM | POA: Insufficient documentation

## 2021-02-28 DIAGNOSIS — M8580 Other specified disorders of bone density and structure, unspecified site: Secondary | ICD-10-CM | POA: Insufficient documentation

## 2021-02-28 DIAGNOSIS — M791 Myalgia, unspecified site: Secondary | ICD-10-CM | POA: Insufficient documentation

## 2021-02-28 DIAGNOSIS — Z885 Allergy status to narcotic agent status: Secondary | ICD-10-CM | POA: Diagnosis not present

## 2021-02-28 DIAGNOSIS — N6311 Unspecified lump in the right breast, upper outer quadrant: Secondary | ICD-10-CM | POA: Diagnosis not present

## 2021-02-28 DIAGNOSIS — Z170 Estrogen receptor positive status [ER+]: Secondary | ICD-10-CM | POA: Insufficient documentation

## 2021-02-28 DIAGNOSIS — C50511 Malignant neoplasm of lower-outer quadrant of right female breast: Secondary | ICD-10-CM | POA: Insufficient documentation

## 2021-02-28 DIAGNOSIS — D72829 Elevated white blood cell count, unspecified: Secondary | ICD-10-CM | POA: Diagnosis not present

## 2021-02-28 DIAGNOSIS — M2550 Pain in unspecified joint: Secondary | ICD-10-CM | POA: Diagnosis not present

## 2021-02-28 DIAGNOSIS — M48061 Spinal stenosis, lumbar region without neurogenic claudication: Secondary | ICD-10-CM | POA: Insufficient documentation

## 2021-02-28 DIAGNOSIS — Z7952 Long term (current) use of systemic steroids: Secondary | ICD-10-CM | POA: Diagnosis not present

## 2021-02-28 MED ORDER — CELECOXIB 100 MG PO CAPS: 100.0000 mg | ORAL_CAPSULE | Freq: Two times a day (BID) | ORAL | Status: DC

## 2021-02-28 MED ORDER — TRAMADOL HCL 50 MG PO TABS: 50.0000 mg | ORAL_TABLET | Freq: Three times a day (TID) | ORAL | Status: DC | PRN

## 2021-02-28 NOTE — Assessment & Plan Note (Signed)
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 1 out of 2 lymph nodes were positive for micrometastasis.Oncotype 12 (8% ROR) T1b N1 mic stage IB XRT from 03/20/15 to 04/14/2015  Current treatment: Anastrozole 1 mg daily started November 2016switched to letrozole 03/01/2019 Letrozole toxicities: 1.Diffuse arthralgias and myalgias:Doing better since she got injections in her knees 2.Osteopenia: Bone density 04/23/2018: T score -1.5: Continue with calcium and vitamin Dand Fosamaxand weightbearing exercises. We will obtain a new bone density test in October of this year 3.Weight gain:Monitoring  Breast cancer index 03/20/2020: No benefit from extended adjuvant therapy, risk of distant recurrence 1.2% Based on these results I recommended discontinuation of antiestrogen therapy having completed 5 years.  Breast cancer surveillance: 1.Breast exam8/31/2022: Benign 2.Mammogram8/27/2021: Benign breast density category C 3.Breast MRI4/26/2022:No evidence of malignancy,breast density category C  CT abdomen pelvis: Small cystocele, no evidence of bone abnormalities.  Foraminal narrowing L5-S1 Patient will follow up with her gynecologist about the cystocele Return to clinic in 1 year for follow-up

## 2021-03-09 NOTE — Patient Instructions (Signed)
DUE TO COVID-19 ONLY ONE VISITOR IS ALLOWED TO COME WITH YOU AND STAY IN THE WAITING ROOM ONLY DURING PRE OP AND PROCEDURE DAY OF SURGERY IF YOU ARE GOING HOME AFTER SURGERY. IF YOU ARE SPENDING THE NIGHT 2 PEOPLE MAY VISIT WITH YOU IN YOUR PRIVATE ROOM AFTER SURGERY UNTIL VISITING  HOURS ARE OVER AT 800 PM AND THE 2 VISITORS CANNOT SPEND THE NIGHT.   YOU NEED TO HAVE A COVID 19 TEST ON___9/22__ THIS TEST MUST BE DONE BEFORE SURGERY,  COVID TESTING SITE  IS LOCATED AT [place], [place]. REMAIN IN YOUR CAR THIS IS A DRIVE UP TEST. AFTER YOUR COVID TEST PLEASE WEAR A MASK OUT IN PUBLIC AND SOCIAL DISTANCE AND [place] YOUR HANDS FREQUENTLY, ALSO ASK ALL YOUR CLOSE CONTACT PERSONS TO WEAR A MASK AND SOCIAL DISTANCE AND [place] THEIR HANDS FREQUENTLY ALSO.               [name]     Your procedure is scheduled on: 03/26/21   Report to [facility] Main  Entrance   Report to short stay at 5:35 AM     Call this number if you have problems the morning of surgery [phone]   No food after midnight.    You may have clear liquid until 5:00 AM.    At 4:30 AM drink pre surgery drink.   Nothing by mouth after 5:00 AM.    CLEAR LIQUID DIET   Foods Allowed                                                                     Foods Excluded                                                                                         NO MILK OR CREAMER IN COFFEE Coffee and tea, regular and decaf                             liquids that you cannot  Plain Jell-O any favor except red or purple                                           see through such as: Fruit ices (not with fruit pulp)                                     milk, soups, orange juice  Iced Popsicles                                    All solid  food Carbonated beverages, regular and diet                                    Cranberry, grape and apple juices Sports drinks like Gatorade Lightly seasoned clear broth  or consume(fat free) Sugar     BRUSH YOUR TEETH MORNING OF SURGERY AND RINSE YOUR MOUTH OUT, NO CHEWING GUM CANDY OR MINTS.     Take these medicines the morning of surgery with A SIP OF WATER: Metoprolol, Lyrica, Amlodipine, Omeprazole, Xanax if needed. Use your inhaler and bring it with you  DO NOT TAKE ANY DIABETIC MEDICATIONS DAY OF YOUR SURGERY                               You may not have any metal on your body including hair pins and              piercings  Do not wear jewelry, make-up, lotions, powders or perfumes, deodorant             Do not wear nail polish on your fingernails.  Do not shave  48 hours prior to surgery.             Do not bring valuables to the hospital. [place].  Contacts, dentures or bridgework may not be worn into surgery.                   Please read over the following fact sheets you were given: _____________________________________________________________________             [facility] - Preparing for Surgery Before surgery, you can play an important role.  Because skin is not sterile, your skin needs to be as free of germs as possible.  You can reduce the number of germs on your skin by washing with CHG (chlorahexidine gluconate) soap before surgery.  CHG is an antiseptic cleaner which kills germs and bonds with the skin to continue killing germs even after washing. Please DO NOT use if you have an allergy to CHG or antibacterial soaps.  If your skin becomes reddened/irritated stop using the CHG and inform your nurse when you arrive at Short Stay. Do not shave (including legs and underarms) for at least 48 hours prior to the first CHG shower.    Please follow these instructions carefully:  1.  Shower with CHG Soap the night before surgery and the  morning of Surgery.  2.  If you choose to wash your hair, wash your hair first as usual with your  normal  shampoo.  3.  After you shampoo, rinse your  hair and body thoroughly to remove the  shampoo.                            4.  Use CHG as you would any other liquid soap.  You can apply chg directly  to the skin and wash                       Gently with a scrungie or clean washcloth.  5.  Apply the CHG Soap to your body ONLY FROM THE NECK DOWN.   Do not use  on face/ open                           Wound or open sores. Avoid contact with eyes, ears mouth and genitals (private parts).                       Wash face,  Genitals (private parts) with your normal soap.             6.  Wash thoroughly, paying special attention to the area where your surgery  will be performed.  7.  Thoroughly rinse your body with warm water from the neck down.  8.  DO NOT shower/wash with your normal soap after using and rinsing off  the CHG Soap.             9.  Pat yourself dry with a clean towel.            10.  Wear clean pajamas.            11.  Place clean sheets on your bed the night of your first shower and do not  sleep with pets. Day of Surgery : Do not apply any lotions/deodorants the morning of surgery.  Please wear clean clothes to the hospital/surgery center.  FAILURE TO FOLLOW THESE INSTRUCTIONS MAY RESULT IN THE CANCELLATION OF YOUR SURGERY PATIENT SIGNATURE_________________________________  NURSE SIGNATURE__________________________________  ________________________________________________________________________   Adam Phenix  An incentive spirometer is a tool that can help keep your lungs clear and active. This tool measures how well you are filling your lungs with each breath. Taking long deep breaths may help reverse or decrease the chance of developing breathing (pulmonary) problems (especially infection) following: A long period of time when you are unable to move or be active. BEFORE THE PROCEDURE  If the spirometer includes an indicator to show your best effort, your nurse or respiratory therapist will set it to a desired  goal. If possible, sit up straight or lean slightly forward. Try not to slouch. Hold the incentive spirometer in an upright position. INSTRUCTIONS FOR USE  Sit on the edge of your bed if possible, or sit up as far as you can in bed or on a chair. Hold the incentive spirometer in an upright position. Breathe out normally. Place the mouthpiece in your mouth and seal your lips tightly around it. Breathe in slowly and as deeply as possible, raising the piston or the ball toward the top of the column. Hold your breath for 3-5 seconds or for as long as possible. Allow the piston or ball to fall to the bottom of the column. Remove the mouthpiece from your mouth and breathe out normally. Rest for a few seconds and repeat Steps 1 through 7 at least 10 times every 1-2 hours when you are awake. Take your time and take a few normal breaths between deep breaths. The spirometer may include an indicator to show your best effort. Use the indicator as a goal to work toward during each repetition. After each set of 10 deep breaths, practice coughing to be sure your lungs are clear. If you have an incision (the cut made at the time of surgery), support your incision when coughing by placing a pillow or rolled up towels firmly against it. Once you are able to get out of bed, walk around indoors and cough well. You may stop using the incentive spirometer when instructed by your caregiver.  RISKS AND COMPLICATIONS Take your time so you do not get dizzy or light-headed. If you are in pain, you may need to take or ask for pain medication before doing incentive spirometry. It is harder to take a deep breath if you are having pain. AFTER USE Rest and breathe slowly and easily. It can be helpful to keep track of a log of your progress. Your caregiver can provide you with a simple table to help with this. If you are using the spirometer at home, follow these instructions: [place] Ward IF:  You are having difficultly  using the spirometer. You have trouble using the spirometer as often as instructed. Your pain medication is not giving enough relief while using the spirometer. You develop fever of 100.5 F (38.1 C) or higher. SEEK IMMEDIATE MEDICAL CARE IF:  You cough up bloody sputum that had not been present before. You develop fever of 102 F (38.9 C) or greater. You develop worsening pain at or near the incision site. MAKE SURE YOU:  Understand these instructions. Will watch your condition. Will get help right away if you are not doing well or get worse. Document Released: 10/28/2006 Document Revised: 09/09/2011 Document Reviewed: 12/29/2006 [facility] Patient Information 2014 [place], [place].   ________________________________________________________________________

## 2021-03-12 ENCOUNTER — Other Ambulatory Visit: Payer: Self-pay

## 2021-03-12 ENCOUNTER — Encounter (HOSPITAL_COMMUNITY): Payer: Self-pay

## 2021-03-12 ENCOUNTER — Encounter (HOSPITAL_COMMUNITY)
Admission: RE | Admit: 2021-03-12 | Discharge: 2021-03-12 | Disposition: A | Discharge status: Home / Self Care | Payer: Medicare PPO | Source: Ambulatory Visit | Attending: Orthopedic Surgery | Admitting: Orthopedic Surgery

## 2021-03-12 DIAGNOSIS — Z01812 Encounter for preprocedural laboratory examination: Secondary | ICD-10-CM | POA: Insufficient documentation

## 2021-03-12 LAB — GLUCOSE, CAPILLARY: Glucose-Capillary: 108 mg/dL — ABNORMAL HIGH (ref 70–99)

## 2021-03-12 LAB — SURGICAL PCR SCREEN
MRSA, PCR: NEGATIVE
Staphylococcus aureus: NEGATIVE

## 2021-03-12 NOTE — Progress Notes (Signed)
COVID test  Completed:  9/22  PCP - Dr. Deland Pretty LOV  02/09/21-chart Cardiologist -   Chest x-ray - no EKG - chart Stress Test - no ECHO - no Cardiac Cath - no Pacemaker/ICD device last checked:NA  Sleep Study - no CPAP -   Fasting Blood Sugar - NA Checks Blood Sugar _____ times a day  Blood Thinner Instructions:NA Aspirin Instructions: Last Dose:  Anesthesia review: yes  Patient denies shortness of breath, fever, cough and chest pain at PAT appointment Pt reports that she can climb 1 flight of stairs, but has SOB doing housework but not ADLs  She was slow to process and instructions were reviewed several times. She is concerned about having dementia from general anesthesia. We talked about spinal and I answered her questions  Patient verbalized understanding of instructions that were given to them at the PAT appointment. Patient was also instructed that they will need to review over the PAT instructions again at home before surgery. yes

## 2021-03-21 ENCOUNTER — Other Ambulatory Visit: Payer: Self-pay | Admitting: Orthopedic Surgery

## 2021-03-21 DIAGNOSIS — I10 Essential (primary) hypertension: Secondary | ICD-10-CM | POA: Diagnosis not present

## 2021-03-21 DIAGNOSIS — M797 Fibromyalgia: Secondary | ICD-10-CM | POA: Diagnosis not present

## 2021-03-21 DIAGNOSIS — R413 Other amnesia: Secondary | ICD-10-CM | POA: Diagnosis not present

## 2021-03-21 DIAGNOSIS — H918X9 Other specified hearing loss, unspecified ear: Secondary | ICD-10-CM | POA: Diagnosis not present

## 2021-03-21 DIAGNOSIS — R609 Edema, unspecified: Secondary | ICD-10-CM | POA: Diagnosis not present

## 2021-03-21 LAB — SARS CORONAVIRUS 2 (TAT 6-24 HRS): SARS Coronavirus 2: NEGATIVE

## 2021-03-25 NOTE — H&P (Signed)
TOTAL KNEE ADMISSION H&P  Patient is being admitted for right total knee arthroplasty.  Subjective:  Chief Complaint: Right knee pain.  HPI: Shannon Hancock, 70 y.o. female has a history of pain and functional disability in the right knee due to  arthritis  and has failed non-surgical conservative treatments for greater than 12 weeks to include NSAID's and/or analgesics, flexibility and strengthening excercises, and activity modification. Onset of symptoms was gradual, starting  several  years ago with gradually worsening course since that time. The patient noted no past surgery on the right knee.  Patient currently rates pain in the right knee at 7 out of 10 with activity. Patient has worsening of pain with activity and weight bearing, pain that interferes with activities of daily living, pain with passive range of motion, and crepitus. Patient has evidence of periarticular osteophytes and joint space narrowing by imaging studies. There is no active infection.  Patient Active Problem List   Diagnosis Date Noted   Leukocytosis 04/19/2020   Thyroid cyst 02/26/2018   Plantar fasciitis 02/19/2016   Family history of breast cancer in sister 03/10/2015   Breast cancer of lower-outer quadrant of right female breast ([place]) 02/10/2015   Abdominal bloating 12/16/2013   Edema 11/09/2013   Urge incontinence 05/05/2013   Atrophic vaginitis 05/05/2013   Syncope 02/15/2013   Irritable bowel syndrome 08/20/2012   Hearing loss 11/07/2011   Generalized anxiety disorder 11/07/2011   Peripheral neuropathy 07/05/2011   Osteoporosis 07/05/2011   Tinnitus, bilateral 04/05/2011   HYPERCHOLESTEROLEMIA 04/05/2010   ANEMIA, IRON DEFICIENCY 04/03/2010   DEPRESSION, MAJOR, MODERATE 04/03/2010   Essential hypertension 04/03/2010   ALLERGIC RHINITIS 04/03/2010   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/03/2010   POSTMENOPAUSAL SYNDROME 04/03/2010   DEGENERATIVE JOINT DISEASE, CERVICAL SPINE 04/03/2010    FIBROMYALGIA 04/03/2010    Past Medical History:  Diagnosis Date   Allergy    Anxiety    Breast cancer ([place]) 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; ER/PR+, Her2-, 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%   Depression    Diabetes mellitus without complication (HCC)    Diverticulosis    Fibromyalgia    GERD (gastroesophageal reflux disease)    Heart murmur    "no murmur" documented in PCP note by [name], MD 02/09/21   High cholesterol    Hypertension    Joint pain     Past Surgical History:  Procedure Laterality Date   BREAST LUMPECTOMY Right 2016   BREAST LUMPECTOMY WITH RADIOACTIVE SEED AND SENTINEL LYMPH NODE BIOPSY Right 02/13/2015   Procedure: RIGHT BREAST LUMPECTOMY WITH RADIOACTIVE SEED AND SENTINEL LYMPH NODE MAPPING;  Surgeon: [name], MD;  Location: [facility];  Service: General;  Laterality: Right;   FOOT SURGERY Left    TUBAL LIGATION      Prior to Admission medications   Medication Sig Start Date End Date Taking? Authorizing Provider  alendronate (FOSAMAX) 70 MG tablet TAKE 1 TABLET BY MOUTH ONCE A WEEK. TAKE WITH A FULL GLASS OF WATER ON AN EMPTY STOMACH. 08/02/20  Yes Nicholas Lose, MD  ALPRAZolam (XANAX) 1 MG tablet TAKE 1 TABLET BY MOUTH 4 TIMES A DAY AS NEEDED FOR SLEEP OR ANXIETY Patient taking differently: Take 0.5-1 mg by mouth 3 (three) times daily as needed for anxiety (Depression).   Yes Copland, Spencer, MD  amLODipine (NORVASC) 5 MG tablet Take 5 mg by mouth daily.   Yes [provider]  Artificial Tear Ointment (DRY EYES OP) Place 1 drop into both eyes  daily as needed (Dry eye).   Yes [provider]  budesonide-formoterol (SYMBICORT) 80-4.5 MCG/ACT inhaler Inhale 2 puffs into the lungs daily as needed (Wheezing and Asthma).   Yes [provider]  celecoxib (CELEBREX) 100 MG capsule Take 1 capsule (100 mg total) by mouth 2 (two) times daily. Patient taking differently: Take 200 mg by mouth daily. 02/28/21  Yes Nicholas Lose, MD   Cholecalciferol (VITAMIN D) 50 MCG (2000 UT) CAPS Take 4,000 Units by mouth daily.   Yes [provider]  diphenhydrAMINE (BENADRYL) 25 MG tablet Take 1 tablet (25 mg total) by mouth every 8 (eight) hours as needed. 04/24/20  Yes Lamptey, Myrene Galas, MD  famotidine (PEPCID) 20 MG tablet Take 20 mg by mouth at bedtime. 02/19/21  Yes [provider]  fexofenadine (ALLEGRA) 180 MG tablet Take 180 mg by mouth daily.   Yes [provider]  fluticasone (FLONASE) 50 MCG/ACT nasal spray Place 2 sprays into both nostrils daily as needed for allergies or rhinitis.   Yes [provider]  Lactobacillus (PROBIOTIC ACIDOPHILUS PO) Take 1 capsule by mouth daily.   Yes [provider]  losartan-hydrochlorothiazide (HYZAAR) 100-12.5 MG per tablet TAKE 1 TABLET BY MOUTH DAILY. 01/10/15  Yes Bedsole, Amy E, MD  LYRICA 100 MG capsule TAKE ONE CAPSULE TWICE A DAY 06/17/13  Yes Bedsole, Amy E, MD  methocarbamol (ROBAXIN) 500 MG tablet Take 500 mg by mouth 2 (two) times daily as needed for muscle spasms. 08/19/19  Yes [provider]  metoprolol succinate (TOPROL-XL) 25 MG 24 hr tablet Take 25 mg by mouth daily as needed (High blood pressure). 09/09/19  Yes [provider]  omeprazole (PRILOSEC) 40 MG capsule Take 40 mg by mouth daily.   Yes [provider]  psyllium (METAMUCIL) 0.52 g capsule Take 1 capsule (0.52 g total) by mouth daily. Patient taking differently: Take 1.04 g by mouth daily. 03/01/19  Yes Nicholas Lose, MD  rosuvastatin (CRESTOR) 5 MG tablet Take 5 mg by mouth 2 (two) times a week. 02/03/21  Yes [provider]  solifenacin (VESICARE) 5 MG tablet Take 5 mg by mouth daily.   Yes [provider]  traMADol (ULTRAM) 50 MG tablet Take 1 tablet (50 mg total) by mouth 3 (three) times daily as needed. Patient taking differently: Take 50 mg by mouth 2 (two) times daily. 02/28/21  Yes Nicholas Lose, MD  ZETIA 10 MG tablet TAKE 1  TABLET BY MOUTH DAILY. 06/12/14  Yes Bedsole, Amy E, MD  calamine lotion Apply 1 application topically as needed for itching. Patient not taking: Reported on 03/07/2021 04/24/20   Chase Picket, MD  fexofenadine-pseudoephedrine (ALLEGRA-D) 60-120 MG 12 hr tablet Take 1 tablet by mouth 2 (two) times daily. Patient not taking: Reported on 03/07/2021 10/13/20   Sable Feil, PA-C    Allergies  Allergen Reactions   Olive Oil Nausea And Vomiting    Severe  Nausea / Vomiting.   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    Employer: DISABLED    Comment: on disablity  Tobacco Use   Smoking status: Former    Packs/day: 0.00  Years: 4.00    Pack years: 0.00    Types: Cigarettes    Quit date: 07/01/1972    Years since quitting: 48.7   Smokeless tobacco: Never   Tobacco comments:    only smoked in college -   Vaping Use   Vaping Use: Never used  Substance and Sexual Activity   Alcohol use: No   Drug use: No   Sexual activity: Not on file  Other Topics Concern   Not on file  Social History Narrative   Regular exercise-no   Living will: None (reviewed 2014)   Social Determinants of Health   Financial Resource Strain: Not on file  Food Insecurity: Not on file  Transportation Needs: Not on file  Physical Activity: Not on file  Stress: Not on file  Social Connections: Not on file  Intimate Partner Violence: Not on file    Tobacco Use: Medium Risk   Smoking Tobacco Use: Former   Smokeless Tobacco Use: Never   Social History   Substance and Sexual Activity  Alcohol Use No    Family History  Problem Relation Age of Onset   Alzheimer's disease Mother    Stroke Mother    Diabetes Mother    Hypertension  Mother    Hyperlipidemia Mother    Depression Mother    Anxiety disorder Mother    Coronary artery disease Father    Hypertension Father    Depression Father    Prostate cancer Father 66   Colon polyps Father        unspecified number   Diabetes Father    Hyperlipidemia Father    Heart disease Father    Kidney disease Father    Breast cancer Sister 30   Colon polyps Sister        3-4 total   Urinary tract infection Sister    Hypertension Brother    Hyperlipidemia Brother    Prostate cancer Brother 57   Breast cancer Maternal Aunt        dx. 79s   Kidney cancer Cousin        dx. 25s; smoker while in college   Lung cancer Maternal Uncle        smoker   Cancer Paternal Aunt        unspecified type   Cirrhosis Paternal Uncle    Colon cancer Maternal Grandmother 29   Cancer Maternal Grandfather        unspecified type   Parkinson's disease Maternal Grandfather    Kidney cancer Paternal Grandfather        dx. 65s   Healthy Daughter    Breast cancer Cousin    Lung cancer Other        MGM's sister   Breast cancer Other    Lung cancer Other    Breast cancer Paternal Aunt 7   Congestive Heart Failure Paternal Uncle    Diabetes Paternal Uncle    Healthy Daughter     ROS: Constitutional: no fever, no chills, no night sweats, no significant weight loss Cardiovascular: no chest pain, no palpitations Respiratory: no cough, no shortness of breath, No COPD Gastrointestinal: no vomiting, no nausea Musculoskeletal: no swelling in Joints, Joint Pain Neurologic: no numbness, no tingling, no difficulty with balance   Objective:  Physical Exam: Well nourished and well developed.  General: Alert and oriented x3, cooperative and pleasant, no acute distress.  Head: normocephalic, atraumatic, neck supple.  Eyes: EOMI.  Respiratory: breath sounds clear in all fields, no wheezing,  rales, or rhonchi. Cardiovascular: Regular rate and rhythm, no murmurs, gallops or rubs.  Abdomen:  non-tender to palpation and soft, normoactive bowel sounds. Musculoskeletal:   Right Hip Exam:   The range of motion: normal without discomfort.   There is no tenderness over the greater trochanter bursa.     Right Knee Exam:   Trace effusion present. No swelling present. Valgus deformity.   The range of motion is: 10 to 120 degrees.   Moderate crepitus on range of motion of the knee.   Positive lateral greater than medial joint line tenderness.   The knee is stable.       Left Knee Exam:   No effusion present. No swelling present.   The Range of motion is: 0 to 125 degrees.   No crepitus on range of motion of the knee.   No medial joint line tenderness.   No lateral joint line tenderness.   The knee is stable.   Calves soft and nontender. Motor function intact in LE. Strength 5/5 LE bilaterally. Neuro: Distal pulses 2+. Sensation to light touch intact in LE.    Vital signs in last 24 hours:    Imaging Review Radiographs- AP and lateral of the right knee dated 10/21/2019 demonstrate bone-on-bone arthritis in the lateral compartment with near bone-on-bone patellofemoral.  Assessment/Plan:  End stage arthritis, right knee   The patient history, physical examination, clinical judgment of the provider and imaging studies are consistent with end stage degenerative joint disease of the right knee and total knee arthroplasty is deemed medically necessary. The treatment options including medical management, injection therapy arthroscopy and arthroplasty were discussed at length. The risks and benefits of total knee arthroplasty were presented and reviewed. The risks due to aseptic loosening, infection, stiffness, patella tracking problems, thromboembolic complications and other imponderables were discussed. The patient acknowledged the explanation, agreed to proceed with the plan and consent was signed. Patient is being admitted for inpatient treatment for surgery, pain control, PT,  OT, prophylactic antibiotics, VTE prophylaxis, progressive ambulation and ADLs and discharge planning. The patient is planning to be discharged  home .   Patient's anticipated LOS is less than 2 midnights, meeting these requirements: - Younger than 46 - Lives within 1 hour of care - Has a competent adult at home to recover with post-op recover - NO history of  - Chronic pain requiring opiods  - Diabetes  - Coronary Artery Disease  - Heart failure  - Heart attack  - Stroke  - DVT/VTE  - Cardiac arrhythmia  - Respiratory Failure/COPD  - Renal failure  - Anemia  - Advanced Liver disease    Therapy Plans: [facility] in [place] Disposition: Home with Husband/Sister Planned DVT Prophylaxis: Xarelto 22m (Hx of Breast CA) DME Needed: [facility] 3-in-1 PCP: Dr. CHarlan Stains(clearance received) TXA: IV Allergies: Oxycodone (itching); Tramadol (itching) Anesthesia Concerns: Patient's mother struggled with cognitive loss following general anesthesia for a knee surgery. Patient concerned about this for herself, stating that she wants the minimal amount of anesthesia possible.  BMI: 31.9 Last HgbA1c: 7.1  Ph[facility] [address] Other: Tramadol (on it now - causes itching)  - Patient was instructed on what medications to stop prior to surgery. - Follow-up visit in 2 weeks with Dr. AWynelle Link- Begin physical therapy following surgery - Pre-operative lab work as pre-surgical testing - Prescriptions will be provided in hospital at time of discharge  SFenton Foy [facility] PA-C Orthopedic Surgery [facility] [place]  [facility]

## 2021-03-26 ENCOUNTER — Ambulatory Visit (HOSPITAL_COMMUNITY): Payer: Medicare PPO | Admitting: Emergency Medicine

## 2021-03-26 ENCOUNTER — Encounter (HOSPITAL_COMMUNITY)
Admission: RE | Disposition: A | Discharge status: Home / Self Care | Payer: Self-pay | Source: Home / Self Care | Attending: Orthopedic Surgery

## 2021-03-26 ENCOUNTER — Encounter (HOSPITAL_COMMUNITY): Payer: Self-pay | Admitting: Orthopedic Surgery

## 2021-03-26 ENCOUNTER — Observation Stay (HOSPITAL_COMMUNITY)
Admission: RE | Admit: 2021-03-26 | Discharge: 2021-03-28 | Disposition: A | Discharge status: Home / Self Care | Payer: Medicare PPO | Attending: Orthopedic Surgery | Admitting: Orthopedic Surgery

## 2021-03-26 ENCOUNTER — Other Ambulatory Visit: Payer: Self-pay

## 2021-03-26 ENCOUNTER — Ambulatory Visit (HOSPITAL_COMMUNITY): Payer: Medicare PPO | Admitting: Certified Registered Nurse Anesthetist

## 2021-03-26 DIAGNOSIS — Z79899 Other long term (current) drug therapy: Secondary | ICD-10-CM | POA: Diagnosis not present

## 2021-03-26 DIAGNOSIS — Z853 Personal history of malignant neoplasm of breast: Secondary | ICD-10-CM | POA: Diagnosis not present

## 2021-03-26 DIAGNOSIS — Z87891 Personal history of nicotine dependence: Secondary | ICD-10-CM | POA: Diagnosis not present

## 2021-03-26 DIAGNOSIS — I10 Essential (primary) hypertension: Secondary | ICD-10-CM | POA: Insufficient documentation

## 2021-03-26 DIAGNOSIS — M1711 Unilateral primary osteoarthritis, right knee: Principal | ICD-10-CM | POA: Diagnosis present

## 2021-03-26 DIAGNOSIS — M179 Osteoarthritis of knee, unspecified: Secondary | ICD-10-CM | POA: Diagnosis present

## 2021-03-26 DIAGNOSIS — D509 Iron deficiency anemia, unspecified: Secondary | ICD-10-CM | POA: Diagnosis not present

## 2021-03-26 DIAGNOSIS — E119 Type 2 diabetes mellitus without complications: Secondary | ICD-10-CM | POA: Insufficient documentation

## 2021-03-26 DIAGNOSIS — M1710 Unilateral primary osteoarthritis, unspecified knee: Secondary | ICD-10-CM

## 2021-03-26 DIAGNOSIS — E7800 Pure hypercholesterolemia, unspecified: Secondary | ICD-10-CM | POA: Diagnosis not present

## 2021-03-26 DIAGNOSIS — G8918 Other acute postprocedural pain: Secondary | ICD-10-CM | POA: Diagnosis not present

## 2021-03-26 HISTORY — PX: TOTAL KNEE ARTHROPLASTY: SHX125

## 2021-03-26 LAB — TYPE AND SCREEN
ABO/RH(D): B POS
Antibody Screen: NEGATIVE

## 2021-03-26 LAB — GLUCOSE, CAPILLARY
Glucose-Capillary: 97 mg/dL (ref 70–99)
Glucose-Capillary: 101 mg/dL — ABNORMAL HIGH (ref 70–99)

## 2021-03-26 LAB — BASIC METABOLIC PANEL
Anion gap: 12 (ref 5–15)
BUN: 12 mg/dL (ref 8–23)
CO2: 24 mmol/L (ref 22–32)
Calcium: 9.4 mg/dL (ref 8.9–10.3)
Chloride: 97 mmol/L — ABNORMAL LOW (ref 98–111)
Creatinine, Ser: 0.68 mg/dL (ref 0.44–1.00)
GFR, Estimated: >60 mL/min (ref >60)
Glucose, Bld: 88 mg/dL (ref 70–99)
Potassium: 4.4 mmol/L (ref 3.5–5.1)
Sodium: 133 mmol/L — ABNORMAL LOW (ref 135–145)

## 2021-03-26 LAB — CBC
HCT: 40.2 % (ref 36.0–46.0)
Hemoglobin: 12.3 g/dL (ref 12.0–15.0)
MCH: 26.2 pg (ref 26.0–34.0)
MCHC: 30.6 g/dL (ref 30.0–36.0)
MCV: 85.5 fL (ref 80.0–100.0)
Platelets: 300 10*3/uL (ref 150–400)
RBC: 4.7 MIL/uL (ref 3.87–5.11)
RDW: 13.8 % (ref 11.5–15.5)
WBC: 9.5 10*3/uL (ref 4.0–10.5)
nRBC: 0 % (ref 0.0–0.2)

## 2021-03-26 LAB — PROTIME-INR
INR: 0.9 (ref 0.8–1.2)
Prothrombin Time: 12.6 seconds (ref 11.4–15.2)

## 2021-03-26 LAB — HEMOGLOBIN A1C
Hgb A1c MFr Bld: 6.3 % — ABNORMAL HIGH (ref 4.8–5.6)
Mean Plasma Glucose: 134.11 mg/dL

## 2021-03-26 LAB — ABO/RH: ABO/RH(D): B POS

## 2021-03-26 SURGERY — ARTHROPLASTY, KNEE, TOTAL
Anesthesia: Spinal | Site: Knee | Laterality: Right

## 2021-03-26 MED ORDER — MIDAZOLAM HCL 2 MG/2ML IJ SOLN
INTRAMUSCULAR | Status: AC
  Administered 2021-03-26: 1 mg via INTRAVENOUS
  Filled 2021-03-26: qty 2

## 2021-03-26 MED ORDER — PHENYLEPHRINE HCL-NACL 20-0.9 MG/250ML-% IV SOLN
INTRAVENOUS | Status: AC
  Filled 2021-03-26: qty 250

## 2021-03-26 MED ORDER — BUPIVACAINE LIPOSOME 1.3 % IJ SUSP
INTRAMUSCULAR | Status: AC
  Filled 2021-03-26: qty 20

## 2021-03-26 MED ORDER — BUPIVACAINE IN DEXTROSE 0.75-8.25 % IT SOLN
INTRATHECAL | Status: DC | PRN
  Administered 2021-03-26: 1.6 mL via INTRATHECAL

## 2021-03-26 MED ORDER — ONDANSETRON HCL 4 MG/2ML IJ SOLN
INTRAMUSCULAR | Status: DC | PRN
  Administered 2021-03-26: 4 mg via INTRAVENOUS

## 2021-03-26 MED ORDER — PHENYLEPHRINE HCL (PRESSORS) 10 MG/ML IV SOLN
INTRAVENOUS | Status: DC | PRN
  Administered 2021-03-26 (×5): 80 ug via INTRAVENOUS

## 2021-03-26 MED ORDER — TRANEXAMIC ACID-NACL 1000-0.7 MG/100ML-% IV SOLN
1000.0000 mg | INTRAVENOUS | Status: AC
  Administered 2021-03-26: 1000 mg via INTRAVENOUS
  Filled 2021-03-26: qty 100

## 2021-03-26 MED ORDER — SODIUM CHLORIDE 0.9 % IV SOLN: 250.0000 mL | INTRAVENOUS | Status: DC

## 2021-03-26 MED ORDER — FENTANYL CITRATE PF 50 MCG/ML IJ SOSY: 50.0000 ug | PREFILLED_SYRINGE | INTRAMUSCULAR | Status: DC

## 2021-03-26 MED ORDER — CEFAZOLIN SODIUM-DEXTROSE 2-4 GM/100ML-% IV SOLN
2.0000 g | INTRAVENOUS | Status: AC
  Administered 2021-03-26: 2 g via INTRAVENOUS
  Filled 2021-03-26: qty 100

## 2021-03-26 MED ORDER — SODIUM CHLORIDE (PF) 0.9 % IJ SOLN
INTRAMUSCULAR | Status: DC | PRN
  Administered 2021-03-26: 60 mL

## 2021-03-26 MED ORDER — PHENYLEPHRINE HCL-NACL 20-0.9 MG/250ML-% IV SOLN
25.0000 ug/min | INTRAVENOUS | Status: DC
Start: 2021-03-26 — End: 2021-03-26
  Administered 2021-03-26 (×2): 25 ug/min via INTRAVENOUS

## 2021-03-26 MED ORDER — METOCLOPRAMIDE HCL 5 MG/ML IJ SOLN
5.0000 mg | Freq: Three times a day (TID) | INTRAMUSCULAR | Status: DC | PRN
Start: 2021-03-26 — End: 2021-03-28

## 2021-03-26 MED ORDER — DEXAMETHASONE SODIUM PHOSPHATE 10 MG/ML IJ SOLN
10.0000 mg | Freq: Once | INTRAMUSCULAR | Status: AC
  Administered 2021-03-27: 10 mg via INTRAVENOUS
  Filled 2021-03-26: qty 1

## 2021-03-26 MED ORDER — PROPOFOL 500 MG/50ML IV EMUL
INTRAVENOUS | Status: DC | PRN
  Administered 2021-03-26: 75 ug/kg/min via INTRAVENOUS

## 2021-03-26 MED ORDER — STERILE WATER FOR IRRIGATION IR SOLN
Status: DC | PRN
  Administered 2021-03-26: 2000 mL

## 2021-03-26 MED ORDER — ACETAMINOPHEN 500 MG PO TABS
1000.0000 mg | ORAL_TABLET | Freq: Four times a day (QID) | ORAL | Status: AC
  Administered 2021-03-26 – 2021-03-27 (×4): 1000 mg via ORAL
  Filled 2021-03-26 (×4): qty 2

## 2021-03-26 MED ORDER — PHENYLEPHRINE HCL-NACL 20-0.9 MG/250ML-% IV SOLN
INTRAVENOUS | Status: DC | PRN
  Administered 2021-03-26: 40 ug/min via INTRAVENOUS

## 2021-03-26 MED ORDER — PHENOL 1.4 % MT LIQD: 1.0000 | OROMUCOSAL | Status: DC | PRN

## 2021-03-26 MED ORDER — MOMETASONE FURO-FORMOTEROL FUM 100-5 MCG/ACT IN AERO: 2.0000 | INHALATION_SPRAY | Freq: Two times a day (BID) | RESPIRATORY_TRACT | Status: DC | PRN

## 2021-03-26 MED ORDER — METOPROLOL SUCCINATE ER 25 MG PO TB24: 25.0000 mg | ORAL_TABLET | Freq: Every day | ORAL | Status: DC | PRN

## 2021-03-26 MED ORDER — HYDROMORPHONE HCL 2 MG PO TABS
2.0000 mg | ORAL_TABLET | ORAL | Status: DC | PRN
  Administered 2021-03-26: 4 mg via ORAL
  Administered 2021-03-26 – 2021-03-27 (×4): 2 mg via ORAL
  Administered 2021-03-27 – 2021-03-28 (×5): 4 mg via ORAL
  Filled 2021-03-26: qty 1
  Filled 2021-03-26 (×2): qty 2
  Filled 2021-03-26 (×2): qty 1
  Filled 2021-03-26 (×6): qty 2

## 2021-03-26 MED ORDER — SODIUM CHLORIDE 0.9 % IR SOLN
Status: DC | PRN
  Administered 2021-03-26: 1000 mL

## 2021-03-26 MED ORDER — FAMOTIDINE 20 MG PO TABS
20.0000 mg | ORAL_TABLET | Freq: Every day | ORAL | Status: DC
  Administered 2021-03-26 – 2021-03-27 (×2): 20 mg via ORAL
  Filled 2021-03-26 (×2): qty 1

## 2021-03-26 MED ORDER — LACTATED RINGERS IV SOLN: INTRAVENOUS | Status: DC

## 2021-03-26 MED ORDER — FLEET ENEMA 7-19 GM/118ML RE ENEM: 1.0000 | ENEMA | Freq: Once | RECTAL | Status: DC | PRN

## 2021-03-26 MED ORDER — AMLODIPINE BESYLATE 5 MG PO TABS
5.0000 mg | ORAL_TABLET | Freq: Every day | ORAL | Status: DC
  Administered 2021-03-28: 5 mg via ORAL
  Filled 2021-03-26: qty 1

## 2021-03-26 MED ORDER — TRAMADOL HCL 50 MG PO TABS: 50.0000 mg | ORAL_TABLET | Freq: Four times a day (QID) | ORAL | Status: DC | PRN

## 2021-03-26 MED ORDER — HYDROCHLOROTHIAZIDE 12.5 MG PO CAPS
12.5000 mg | ORAL_CAPSULE | Freq: Every day | ORAL | Status: DC
  Administered 2021-03-28: 12.5 mg via ORAL
  Filled 2021-03-26: qty 1

## 2021-03-26 MED ORDER — FENTANYL CITRATE PF 50 MCG/ML IJ SOSY
PREFILLED_SYRINGE | INTRAMUSCULAR | Status: AC
  Administered 2021-03-26: 50 ug via INTRAVENOUS
  Filled 2021-03-26: qty 1

## 2021-03-26 MED ORDER — 0.9 % SODIUM CHLORIDE (POUR BTL) OPTIME
TOPICAL | Status: DC | PRN
  Administered 2021-03-26: 1000 mL

## 2021-03-26 MED ORDER — POVIDONE-IODINE 10 % EX SWAB
2.0000 "application " | Freq: Once | CUTANEOUS | Status: AC
  Administered 2021-03-26: 2 via TOPICAL

## 2021-03-26 MED ORDER — FENTANYL CITRATE (PF) 100 MCG/2ML IJ SOLN
INTRAMUSCULAR | Status: AC
  Filled 2021-03-26: qty 2

## 2021-03-26 MED ORDER — CEFAZOLIN SODIUM-DEXTROSE 2-4 GM/100ML-% IV SOLN
2.0000 g | Freq: Four times a day (QID) | INTRAVENOUS | Status: AC
  Administered 2021-03-26 (×2): 2 g via INTRAVENOUS
  Filled 2021-03-26 (×2): qty 100

## 2021-03-26 MED ORDER — EZETIMIBE 10 MG PO TABS
10.0000 mg | ORAL_TABLET | Freq: Every day | ORAL | Status: DC
  Administered 2021-03-27 – 2021-03-28 (×2): 10 mg via ORAL
  Filled 2021-03-26 (×2): qty 1

## 2021-03-26 MED ORDER — PHENYLEPHRINE HCL (PRESSORS) 10 MG/ML IV SOLN
INTRAVENOUS | Status: AC
  Filled 2021-03-26: qty 1

## 2021-03-26 MED ORDER — METOCLOPRAMIDE HCL 5 MG PO TABS: 5.0000 mg | ORAL_TABLET | Freq: Three times a day (TID) | ORAL | Status: DC | PRN

## 2021-03-26 MED ORDER — FENTANYL CITRATE (PF) 100 MCG/2ML IJ SOLN
INTRAMUSCULAR | Status: DC | PRN
  Administered 2021-03-26 (×2): 50 ug via INTRAVENOUS

## 2021-03-26 MED ORDER — PHENYLEPHRINE HCL-NACL 20-0.9 MG/250ML-% IV SOLN: 0.0000 ug/min | INTRAVENOUS | Status: DC

## 2021-03-26 MED ORDER — EPHEDRINE SULFATE 50 MG/ML IJ SOLN
INTRAMUSCULAR | Status: DC | PRN
  Administered 2021-03-26: 10 mg via INTRAVENOUS

## 2021-03-26 MED ORDER — MIDAZOLAM HCL 2 MG/2ML IJ SOLN: 1.0000 mg | INTRAMUSCULAR | Status: DC

## 2021-03-26 MED ORDER — LOSARTAN POTASSIUM 50 MG PO TABS
100.0000 mg | ORAL_TABLET | Freq: Every day | ORAL | Status: DC
  Administered 2021-03-28: 100 mg via ORAL
  Filled 2021-03-26: qty 2

## 2021-03-26 MED ORDER — BUPIVACAINE HCL (PF) 0.5 % IJ SOLN
INTRAMUSCULAR | Status: DC | PRN
  Administered 2021-03-26: 20 mL via PERINEURAL

## 2021-03-26 MED ORDER — ONDANSETRON HCL 4 MG/2ML IJ SOLN: 4.0000 mg | Freq: Once | INTRAMUSCULAR | Status: DC | PRN

## 2021-03-26 MED ORDER — SODIUM CHLORIDE (PF) 0.9 % IJ SOLN
INTRAMUSCULAR | Status: AC
  Filled 2021-03-26: qty 10

## 2021-03-26 MED ORDER — ACETAMINOPHEN 10 MG/ML IV SOLN
1000.0000 mg | Freq: Four times a day (QID) | INTRAVENOUS | Status: DC
  Administered 2021-03-26: 1000 mg via INTRAVENOUS
  Filled 2021-03-26: qty 100

## 2021-03-26 MED ORDER — ONDANSETRON HCL 4 MG PO TABS: 4.0000 mg | ORAL_TABLET | Freq: Four times a day (QID) | ORAL | Status: DC | PRN

## 2021-03-26 MED ORDER — DIPHENHYDRAMINE HCL 12.5 MG/5ML PO ELIX: 12.5000 mg | ORAL_SOLUTION | ORAL | Status: DC | PRN

## 2021-03-26 MED ORDER — BUPIVACAINE LIPOSOME 1.3 % IJ SUSP: 20.0000 mL | Freq: Once | INTRAMUSCULAR | Status: DC

## 2021-03-26 MED ORDER — SODIUM CHLORIDE 0.9 % IV SOLN: INTRAVENOUS | Status: DC

## 2021-03-26 MED ORDER — METHOCARBAMOL 500 MG PO TABS
500.0000 mg | ORAL_TABLET | Freq: Four times a day (QID) | ORAL | Status: DC | PRN
  Administered 2021-03-26 – 2021-03-28 (×4): 500 mg via ORAL
  Filled 2021-03-26 (×4): qty 1

## 2021-03-26 MED ORDER — DEXAMETHASONE SODIUM PHOSPHATE 10 MG/ML IJ SOLN
8.0000 mg | Freq: Once | INTRAMUSCULAR | Status: AC
  Administered 2021-03-26: 10 mg via INTRAVENOUS

## 2021-03-26 MED ORDER — BUPIVACAINE LIPOSOME 1.3 % IJ SUSP
INTRAMUSCULAR | Status: DC | PRN
  Administered 2021-03-26: 20 mL

## 2021-03-26 MED ORDER — FENTANYL CITRATE PF 50 MCG/ML IJ SOSY
PREFILLED_SYRINGE | INTRAMUSCULAR | Status: AC
  Filled 2021-03-26: qty 1

## 2021-03-26 MED ORDER — ORAL CARE MOUTH RINSE: 15.0000 mL | Freq: Once | OROMUCOSAL | Status: AC

## 2021-03-26 MED ORDER — ALPRAZOLAM 0.5 MG PO TABS
0.5000 mg | ORAL_TABLET | Freq: Three times a day (TID) | ORAL | Status: DC | PRN
  Administered 2021-03-26 – 2021-03-27 (×2): 1 mg via ORAL
  Filled 2021-03-26 (×2): qty 2

## 2021-03-26 MED ORDER — PREGABALIN 100 MG PO CAPS
100.0000 mg | ORAL_CAPSULE | Freq: Two times a day (BID) | ORAL | Status: DC
  Administered 2021-03-26 – 2021-03-28 (×4): 100 mg via ORAL
  Filled 2021-03-26 (×4): qty 1

## 2021-03-26 MED ORDER — CHLORHEXIDINE GLUCONATE 0.12 % MT SOLN
15.0000 mL | Freq: Once | OROMUCOSAL | Status: AC
  Administered 2021-03-26: 15 mL via OROMUCOSAL

## 2021-03-26 MED ORDER — PHENYLEPHRINE 40 MCG/ML (10ML) SYRINGE FOR IV PUSH (FOR BLOOD PRESSURE SUPPORT)
PREFILLED_SYRINGE | INTRAVENOUS | Status: AC
  Filled 2021-03-26: qty 20

## 2021-03-26 MED ORDER — METHOCARBAMOL 1000 MG/10ML IJ SOLN
500.0000 mg | Freq: Four times a day (QID) | INTRAVENOUS | Status: DC | PRN
  Filled 2021-03-26: qty 5

## 2021-03-26 MED ORDER — MORPHINE SULFATE (PF) 2 MG/ML IV SOLN: 0.5000 mg | INTRAVENOUS | Status: DC | PRN

## 2021-03-26 MED ORDER — PANTOPRAZOLE SODIUM 40 MG PO TBEC
40.0000 mg | DELAYED_RELEASE_TABLET | Freq: Every day | ORAL | Status: DC
  Administered 2021-03-27 – 2021-03-28 (×2): 40 mg via ORAL
  Filled 2021-03-26 (×2): qty 1

## 2021-03-26 MED ORDER — ONDANSETRON HCL 4 MG/2ML IJ SOLN: 4.0000 mg | Freq: Four times a day (QID) | INTRAMUSCULAR | Status: DC | PRN

## 2021-03-26 MED ORDER — LOSARTAN POTASSIUM-HCTZ 100-12.5 MG PO TABS: 1.0000 | ORAL_TABLET | Freq: Every day | ORAL | Status: DC

## 2021-03-26 MED ORDER — MENTHOL 3 MG MT LOZG: 1.0000 | LOZENGE | OROMUCOSAL | Status: DC | PRN

## 2021-03-26 MED ORDER — DARIFENACIN HYDROBROMIDE ER 7.5 MG PO TB24
7.5000 mg | ORAL_TABLET | Freq: Every day | ORAL | Status: DC
  Administered 2021-03-27 – 2021-03-28 (×2): 7.5 mg via ORAL
  Filled 2021-03-26 (×2): qty 1

## 2021-03-26 MED ORDER — POLYETHYLENE GLYCOL 3350 17 G PO PACK: 17.0000 g | PACK | Freq: Every day | ORAL | Status: DC | PRN

## 2021-03-26 MED ORDER — HYDROMORPHONE HCL 1 MG/ML IJ SOLN: 0.2500 mg | INTRAMUSCULAR | Status: DC | PRN

## 2021-03-26 MED ORDER — BISACODYL 10 MG RE SUPP: 10.0000 mg | Freq: Every day | RECTAL | Status: DC | PRN

## 2021-03-26 MED ORDER — DOCUSATE SODIUM 100 MG PO CAPS
100.0000 mg | ORAL_CAPSULE | Freq: Two times a day (BID) | ORAL | Status: DC
  Administered 2021-03-26 – 2021-03-28 (×4): 100 mg via ORAL
  Filled 2021-03-26 (×4): qty 1

## 2021-03-26 MED ORDER — RIVAROXABAN 10 MG PO TABS
10.0000 mg | ORAL_TABLET | Freq: Every day | ORAL | Status: DC
  Administered 2021-03-27 – 2021-03-28 (×2): 10 mg via ORAL
  Filled 2021-03-26 (×3): qty 1

## 2021-03-26 MED ORDER — ACETAMINOPHEN 10 MG/ML IV SOLN: 1000.0000 mg | Freq: Once | INTRAVENOUS | Status: DC | PRN

## 2021-03-26 SURGICAL SUPPLY — 56 items
ATTUNE MED DOME PAT 38 KNEE (Knees) ×1 IMPLANT
ATTUNE PS FEM RT SZ 5 CEM KNEE (Femur) ×1 IMPLANT
ATTUNE PSRP INSE SZ5 7 KNEE (Insert) ×1 IMPLANT
BAG COUNTER SPONGE SURGICOUNT (BAG) ×1 IMPLANT
BAG SPEC THK2 15X12 ZIP CLS (MISCELLANEOUS)
BAG SPNG CNTER NS LX DISP (BAG) ×1
BAG ZIPLOCK 12X15 (MISCELLANEOUS) ×1 IMPLANT
BASEPLATE TIBIAL ROTATING SZ 4 (Knees) ×1 IMPLANT
BLADE SAG 18X100X1.27 (BLADE) ×2 IMPLANT
BLADE SAW SGTL 11.0X1.19X90.0M (BLADE) ×2 IMPLANT
BNDG ELASTIC 6X5.8 VLCR STR LF (GAUZE/BANDAGES/DRESSINGS) ×2 IMPLANT
BOWL SMART MIX CTS (DISPOSABLE) ×2 IMPLANT
BSPLAT TIB 4 CMNT ROT PLAT STR (Knees) ×1 IMPLANT
CEMENT HV SMART SET (Cement) ×4 IMPLANT
COVER SURGICAL LIGHT HANDLE (MISCELLANEOUS) ×2 IMPLANT
CUFF TOURN SGL QUICK 34 (TOURNIQUET CUFF) ×2
CUFF TRNQT CYL 34X4.125X (TOURNIQUET CUFF) ×1 IMPLANT
DECANTER SPIKE VIAL GLASS SM (MISCELLANEOUS) ×2 IMPLANT
DRAPE INCISE IOBAN 66X45 STRL (DRAPES) ×2 IMPLANT
DRAPE U-SHAPE 47X51 STRL (DRAPES) ×2 IMPLANT
DRSG AQUACEL AG ADV 3.5X10 (GAUZE/BANDAGES/DRESSINGS) ×2 IMPLANT
DURAPREP 26ML APPLICATOR (WOUND CARE) ×2 IMPLANT
ELECT REM PT RETURN 15FT ADLT (MISCELLANEOUS) ×2 IMPLANT
GLOVE SRG 8 PF TXTR STRL LF DI (GLOVE) ×1 IMPLANT
GLOVE SURG ENC MOIS LTX SZ6.5 (GLOVE) ×2 IMPLANT
GLOVE SURG ENC MOIS LTX SZ8 (GLOVE) ×4 IMPLANT
GLOVE SURG UNDER POLY LF SZ7 (GLOVE) ×2 IMPLANT
GLOVE SURG UNDER POLY LF SZ8 (GLOVE) ×2
GLOVE SURG UNDER POLY LF SZ8.5 (GLOVE) ×2 IMPLANT
GOWN STRL REUS W/TWL LRG LVL3 (GOWN DISPOSABLE) ×4 IMPLANT
GOWN STRL REUS W/TWL XL LVL3 (GOWN DISPOSABLE) ×2 IMPLANT
HANDPIECE INTERPULSE COAX TIP (DISPOSABLE) ×2
HOLDER FOLEY CATH W/STRAP (MISCELLANEOUS) ×1 IMPLANT
IMMOBILIZER KNEE 20 (SOFTGOODS) ×2
IMMOBILIZER KNEE 20 THIGH 36 (SOFTGOODS) ×1 IMPLANT
KIT TURNOVER KIT A (KITS) ×2 IMPLANT
MANIFOLD NEPTUNE II (INSTRUMENTS) ×2 IMPLANT
NS IRRIG 1000ML POUR BTL (IV SOLUTION) ×1 IMPLANT
PACK TOTAL KNEE CUSTOM (KITS) ×2 IMPLANT
PADDING CAST COTTON 6X4 STRL (CAST SUPPLIES) ×3 IMPLANT
PIN DRILL FIX HALF THREAD (BIT) ×1 IMPLANT
PIN STEINMAN FIXATION KNEE (PIN) ×1 IMPLANT
PROTECTOR NERVE ULNAR (MISCELLANEOUS) ×2 IMPLANT
SET HNDPC FAN SPRY TIP SCT (DISPOSABLE) ×1 IMPLANT
SPONGE T-LAP 18X18 ~~LOC~~+RFID (SPONGE) ×2 IMPLANT
STRIP CLOSURE SKIN 1/2X4 (GAUZE/BANDAGES/DRESSINGS) ×4 IMPLANT
SUT MNCRL AB 4-0 PS2 18 (SUTURE) ×2 IMPLANT
SUT STRATAFIX 0 PDS 27 VIOLET (SUTURE) ×2
SUT VIC AB 2-0 CT1 27 (SUTURE) ×6
SUT VIC AB 2-0 CT1 TAPERPNT 27 (SUTURE) ×3 IMPLANT
SUTURE STRATFX 0 PDS 27 VIOLET (SUTURE) ×1 IMPLANT
TRAY FOLEY MTR SLVR 14FR STAT (SET/KITS/TRAYS/PACK) ×1 IMPLANT
TRAY FOLEY MTR SLVR 16FR STAT (SET/KITS/TRAYS/PACK) ×1 IMPLANT
TUBE SUCTION HIGH CAP CLEAR NV (SUCTIONS) ×2 IMPLANT
WATER STERILE IRR 1000ML POUR (IV SOLUTION) ×2 IMPLANT
WRAP KNEE MAXI GEL POST OP (GAUZE/BANDAGES/DRESSINGS) ×2 IMPLANT

## 2021-03-26 NOTE — Op Note (Signed)
OPERATIVE REPORT-TOTAL KNEE ARTHROPLASTY   Pre-operative diagnosis- Osteoarthritis  Right knee(s)  Post-operative diagnosis- Osteoarthritis Right knee(s)  Procedure-  Right  Total Knee Arthroplasty  Surgeon- Dione Plover. Indy Prestwood, MD  Assistant- Molli Barrows, PA-C   Anesthesia-   Adductor canal block and spinal  EBL-25 mL   Drains None  Tourniquet time-  Total Tourniquet Time Documented: Thigh (Right) - 36 minutes Total: Thigh (Right) - 36 minutes     Complications- None  Condition-PACU - hemodynamically stable.   Brief Clinical Note  GLENNDA WEATHERHOLTZ is a 70 y.o. year old female with end stage OA of her right knee with progressively worsening pain and dysfunction. She has constant pain, with activity and at rest and significant functional deficits with difficulties even with ADLs. She has had extensive non-op management including analgesics, injections of cortisone and viscosupplements, and home exercise program, but remains in significant pain with significant dysfunction.Radiographs show bone on bone arthritis lateral and patellofemoral. She presents now for right Total Knee Arthroplasty.     Procedure in detail---   The patient is brought into the operating room and positioned supine on the operating table. After successful administration of  Adductor canal block and spinal,   a tourniquet is placed high on the  Right thigh(s) and the lower extremity is prepped and draped in the usual sterile fashion. Time out is performed by the operating team and then the  Right lower extremity is wrapped in Esmarch, knee flexed and the tourniquet inflated to 300 mmHg.       A midline incision is made with a ten blade through the subcutaneous tissue to the level of the extensor mechanism. A fresh blade is used to make a medial parapatellar arthrotomy. Soft tissue over the proximal medial tibia is subperiosteally elevated to the joint line with a knife and into the semimembranosus bursa with  a Cobb elevator. Soft tissue over the proximal lateral tibia is elevated with attention being paid to avoiding the patellar tendon on the tibial tubercle. The patella is everted, knee flexed 90 degrees and the ACL and PCL are removed. Findings are bone on bone lateral and patellofemoral with large global osteophytes.        The drill is used to create a starting hole in the distal femur and the canal is thoroughly irrigated with sterile saline to remove the fatty contents. The 5 degree Right  valgus alignment guide is placed into the femoral canal and the distal femoral cutting block is pinned to remove 9 mm off the distal femur. Resection is made with an oscillating saw.      The tibia is subluxed forward and the menisci are removed. The extramedullary alignment guide is placed referencing proximally at the medial aspect of the tibial tubercle and distally along the second metatarsal axis and tibial crest. The block is pinned to remove 29mm off the more deficient lateral  side. Resection is made with an oscillating saw. Size 4is the most appropriate size for the tibia and the proximal tibia is prepared with the modular drill and keel punch for that size.      The femoral sizing guide is placed and size 5 is most appropriate. Rotation is marked off the epicondylar axis and confirmed by creating a rectangular flexion gap at 90 degrees. The size 5 cutting block is pinned in this rotation and the anterior, posterior and chamfer cuts are made with the oscillating saw. The intercondylar block is then placed and that cut is made.  Trial size 4 tibial component, trial size 5 posterior stabilized femur and a 7  mm posterior stabilized rotating platform insert trial is placed. Full extension is achieved with excellent varus/valgus and anterior/posterior balance throughout full range of motion. The patella is everted and thickness measured to be 22  mm. Free hand resection is taken to 12 mm, a 38 template is placed,  lug holes are drilled, trial patella is placed, and it tracks normally. Osteophytes are removed off the posterior femur with the trial in place. All trials are removed and the cut bone surfaces prepared with pulsatile lavage. Cement is mixed and once ready for implantation, the size 4 tibial implant, size  5 posterior stabilized femoral component, and the size 38 patella are cemented in place and the patella is held with the clamp. The trial insert is placed and the knee held in full extension. The Exparel (20 ml mixed with 60 ml saline) is injected into the extensor mechanism, posterior capsule, medial and lateral gutters and subcutaneous tissues.  All extruded cement is removed and once the cement is hard the permanent 7 mm posterior stabilized rotating platform insert is placed into the tibial tray.      The wound is copiously irrigated with saline solution and the extensor mechanism closed with # 0 Stratofix suture. The tourniquet is released for a total tourniquet time of 36  minutes. Flexion against gravity is 140 degrees and the patella tracks normally. Subcutaneous tissue is closed with 2.0 vicryl and subcuticular with running 4.0 Monocryl. The incision is cleaned and dried and steri-strips and a bulky sterile dressing are applied. The limb is placed into a knee immobilizer and the patient is awakened and transported to recovery in stable condition.      Please note that a surgical assistant was a medical necessity for this procedure in order to perform it in a safe and expeditious manner. Surgical assistant was necessary to retract the ligaments and vital neurovascular structures to prevent injury to them and also necessary for proper positioning of the limb to allow for anatomic placement of the prosthesis.   Dione Plover Zuriel Roskos, MD    03/26/2021, 9:14 AM

## 2021-03-26 NOTE — Anesthesia Postprocedure Evaluation (Signed)
Anesthesia Post Note  Patient: Shannon Hancock  Procedure(s) Performed: TOTAL KNEE ARTHROPLASTY (Right: Knee)     Patient location during evaluation: PACU Anesthesia Type: Spinal Level of consciousness: oriented and awake and alert Pain management: pain level controlled Vital Signs Assessment: post-procedure vital signs reviewed and stable Respiratory status: spontaneous breathing, respiratory function stable and patient connected to nasal cannula oxygen Cardiovascular status: blood pressure returned to baseline and stable Postop Assessment: no headache, no backache and no apparent nausea or vomiting Anesthetic complications: no   No notable events documented.  Last Vitals:  Vitals:   03/26/21 1115 03/26/21 1130  BP: 107/60 (!) 102/57  Pulse: (!) 57 (!) 56  Resp: 15 10  Temp:    SpO2: 93% 94%    Last Pain:  Vitals:   03/26/21 1130  TempSrc:   PainSc: 0-No pain                 Talene Glastetter S

## 2021-03-26 NOTE — Progress Notes (Signed)
Assisted Dr. Rose with right, ultrasound guided, adductor canal block. Side rails up, monitors on throughout procedure. See vital signs in flow sheet. Tolerated Procedure well.  

## 2021-03-26 NOTE — Evaluation (Signed)
Physical Therapy Evaluation Patient Details Name: Shannon Hancock MRN: [id number] DOB: July 30, 1950 Today's Date: 03/26/2021  History of Present Illness  70 y.o. female s/p R TKA. PMH: fibromyalgia, depression, breast CA, anxiety, HTN  Clinical Impression  Pt is s/p TKA resulting in the deficits listed below (see PT Problem List).   Pt amb ~ 59' with RW and min assist, incr time and cues to stay on task.  Pt will benefit from skilled PT to increase their independence and safety with mobility to allow discharge to the venue listed below.         Recommendations for follow up therapy are one component of a multi-disciplinary discharge planning process, led by the attending physician.  Recommendations may be updated based on patient status, additional functional criteria and insurance authorization.  Follow Up Recommendations Follow surgeon's recommendation for DC plan and follow-up therapies    Equipment Recommendations  Rolling walker with 5" wheels    Recommendations for Other Services       Precautions / Restrictions Precautions Precautions: Fall;Knee Required Braces or Orthoses: Knee Immobilizer - Right Restrictions Weight Bearing Restrictions: No Other Position/Activity Restrictions: WBAT      Mobility  Bed Mobility Overal bed mobility: Needs Assistance Bed Mobility: Supine to Sit     Supine to sit: Min assist;Mod assist     General bed mobility comments: incr time, assist with LEs and scooting forward    Transfers Overall transfer level: Needs assistance Equipment used: Rolling walker (2 wheeled) Transfers: Sit to/from Stand Sit to Stand: Min assist         General transfer comment: cues for hand placement and RLE position  Ambulation/Gait Ambulation/Gait assistance: Min assist Gait Distance (Feet): 35 Feet Assistive device: Rolling walker (2 wheeled) Gait Pattern/deviations: Step-to pattern;Decreased stance time - right;Trunk flexed     General  Gait Details: cues for sequence and RW position  Stairs            Wheelchair Mobility    Modified Rankin (Stroke Patients Only)       Balance                                             Pertinent Vitals/Pain Pain Assessment: 0-10 Pain Score: 7  Pain Location: right knee Pain Descriptors / Indicators: Grimacing;Sore;Tightness;Aching Pain Intervention(s): Limited activity within patient's tolerance;Monitored during session;Premedicated before session;Repositioned;Ice applied    Home Living Family/patient expects to be discharged to:: Private residence Living Arrangements: Spouse/significant other Available Help at Discharge: Family Type of Home: House Home Access: Stairs to enter Entrance Stairs-Rails: Psychiatric nurse of Steps: 4 Home Layout: Two level;Able to live on main level with bedroom/bathroom Home Equipment: None      Prior Function Level of Independence: Independent               Hand Dominance        Extremity/Trunk Assessment   Upper Extremity Assessment Upper Extremity Assessment: Overall WFL for tasks assessed    Lower Extremity Assessment Lower Extremity Assessment: RLE deficits/detail RLE Deficits / Details: ankle WFL, knee extension 2+/5 ~ 15 degree quad lag.  hip flexion2+/5       Communication   Communication: No difficulties  Cognition Arousal/Alertness: Awake/alert Behavior During Therapy: WFL for tasks assessed/performed Overall Cognitive Status: Within Functional Limits for tasks assessed  General Comments      Exercises Total Joint Exercises Ankle Circles/Pumps: AROM;Both;5 reps Quad Sets: AROM;Right;Limitations Quad Sets Limitations: pain   Assessment/Plan    PT Assessment Patient needs continued PT services  PT Problem List Decreased strength;Decreased range of motion;Decreased activity tolerance;Decreased  mobility;Pain;Decreased knowledge of use of DME       PT Treatment Interventions DME instruction;Therapeutic activities;Gait training;Functional mobility training;Therapeutic exercise;Patient/family education;Stair training    PT Goals (Current goals can be found in the Care Plan section)  Acute Rehab PT Goals Patient Stated Goal: less pain PT Goal Formulation: With patient Time For Goal Achievement: 04/02/21    Frequency 7X/week   Barriers to discharge        Co-evaluation               AM-PAC PT "6 Clicks" Mobility  Outcome Measure Help needed turning from your back to your side while in a flat bed without using bedrails?: A Little Help needed moving from lying on your back to sitting on the side of a flat bed without using bedrails?: A Little Help needed moving to and from a bed to a chair (including a wheelchair)?: A Little Help needed standing up from a chair using your arms (e.g., wheelchair or bedside chair)?: A Little Help needed to walk in hospital room?: A Little Help needed climbing 3-5 steps with a railing? : A Little 6 Click Score: 18    End of Session Equipment Utilized During Treatment: Gait belt;Right knee immobilizer Activity Tolerance: Patient tolerated treatment well Patient left: in chair;with call bell/phone within reach;with family/visitor present Nurse Communication: Mobility status PT Visit Diagnosis: Other abnormalities of gait and mobility (R26.89);Difficulty in walking, not elsewhere classified (R26.2)    Time: 0272-5366 PT Time Calculation (min) (ACUTE ONLY): 23 min   Charges:   PT Evaluation $PT Eval Low Complexity: 1 Low PT Treatments $Gait Training: 8-22 mins        Baxter Flattery, PT  Acute Rehab Dept ([place]) [phone] Pager [phone]  03/26/2021   [facility] 03/26/2021, 3:49 PM

## 2021-03-26 NOTE — Progress Notes (Signed)
Orthopedic Tech Progress Note Patient Details:  Shannon Hancock 16-Apr-1951 [id number]  CPM Right Knee CPM Right Knee: On Right Knee Flexion (Degrees): 40 Right Knee Extension (Degrees): 10  Post Interventions Patient Tolerated: Well Instructions Provided: Care of device  [place] Pink 03/26/2021, 10:34 AM

## 2021-03-26 NOTE — Interval H&P Note (Signed)
History and Physical Interval Note:  03/26/2021 6:29 AM  [name]  has presented today for surgery, with the diagnosis of right knee osteoarthritis.  The various methods of treatment have been discussed with the patient and family. After consideration of risks, benefits and other options for treatment, the patient has consented to  Procedure(s): TOTAL KNEE ARTHROPLASTY (Right) as a surgical intervention.  The patient's history has been reviewed, patient examined, no change in status, stable for surgery.  I have reviewed the patient's chart and labs.  Questions were answered to the patient's satisfaction.     Pilar Plate Doniesha Landau

## 2021-03-26 NOTE — Discharge Instructions (Addendum)
Shannon Arabian, MD Total Joint Specialist [facility] [place] [address], [address] [place], [place] [address] [phone]  TOTAL KNEE REPLACEMENT POSTOPERATIVE DIRECTIONS    Knee Rehabilitation, Guidelines Following Surgery  Results after knee surgery are often greatly improved when you follow the exercise, range of motion and muscle strengthening exercises prescribed by your doctor. Safety measures are also important to protect the knee from further injury. If any of these exercises cause you to have increased pain or swelling in your knee joint, decrease the amount until you are comfortable again and slowly increase them. If you have problems or questions, call your caregiver or physical therapist for advice.   BLOOD CLOT PREVENTION Take a 10 mg Xarelto once a day for three weeks following surgery. Then take an 81 mg Aspirin once a day for three weeks. Then discontinue Aspirin. You may resume your vitamins/supplements once you have discontinued the Xarelto. Do not take any NSAIDs (Advil, Aleve, Ibuprofen, Meloxicam, etc.) until you have discontinued the Xarelto.   HOME CARE INSTRUCTIONS  Remove items at home which could result in a fall. This includes throw rugs or furniture in walking pathways.  ICE to the affected knee as much as tolerated. Icing helps control swelling. If the swelling is well controlled you will be more comfortable and rehab easier. Continue to use ice on the knee for pain and swelling from surgery. You may notice swelling that will progress down to the foot and ankle. This is normal after surgery. Elevate the leg when you are not up walking on it.    Continue to use the breathing machine which will help keep your temperature down. It is common for your temperature to cycle up and down following surgery, especially at night when you are not up moving around and exerting yourself. The breathing machine keeps your lungs expanded and your temperature  down. Do not place pillow under the operative knee, focus on keeping the knee straight while resting  DIET You may resume your previous home diet once you are discharged from the hospital.  DRESSING / WOUND CARE / SHOWERING Keep your bulky bandage on for 2 days. On the third post-operative day you may remove the Ace bandage and gauze. There is a waterproof adhesive bandage on your skin which will stay in place until your first follow-up appointment. Once you remove this you will not need to place another bandage You may begin showering 3 days following surgery, but do not submerge the incision under water.  ACTIVITY For the first 5 days, the key is rest and control of pain and swelling Do your home exercises twice a day starting on post-operative day 3. On the days you go to physical therapy, just do the home exercises once that day. You should rest, ice and elevate the leg for 50 minutes out of every hour. Get up and walk/stretch for 10 minutes per hour. After 5 days you can increase your activity slowly as tolerated. Walk with your walker as instructed. Use the walker until you are comfortable transitioning to a cane. Walk with the cane in the opposite hand of the operative leg. You may discontinue the cane once you are comfortable and walking steadily. Avoid periods of inactivity such as sitting longer than an hour when not asleep. This helps prevent blood clots.  You may discontinue the knee immobilizer once you are able to perform a straight leg raise while lying down. You may resume a sexual relationship in one month or  when given the OK by your doctor.  You may return to work once you are cleared by your doctor.  Do not drive a car for 6 weeks or until released by your surgeon.  Do not drive while taking narcotics.  TED HOSE STOCKINGS Wear the elastic stockings on both legs for three weeks following surgery during the day. You may remove them at night for sleeping.  WEIGHT  BEARING Weight bearing as tolerated with assist device (walker, cane, etc) as directed, use it as long as suggested by your surgeon or therapist, typically at least 4-6 weeks.  POSTOPERATIVE CONSTIPATION PROTOCOL Constipation - defined medically as fewer than three stools per week and severe constipation as less than one stool per week.  One of the most common issues patients have following surgery is constipation.  Even if you have a regular bowel pattern at home, your normal regimen is likely to be disrupted due to multiple reasons following surgery.  Combination of anesthesia, postoperative narcotics, change in appetite and fluid intake all can affect your bowels.  In order to avoid complications following surgery, here are some recommendations in order to help you during your recovery period.  Colace (docusate) - Pick up an over-the-counter form of Colace or another stool softener and take twice a day as long as you are requiring postoperative pain medications.  Take with a full glass of water daily.  If you experience loose stools or diarrhea, hold the colace until you stool forms back up. If your symptoms do not get better within 1 week or if they get worse, check with your doctor. Dulcolax (bisacodyl) - Pick up over-the-counter and take as directed by the product packaging as needed to assist with the movement of your bowels.  Take with a full glass of water.  Use this product as needed if not relieved by Colace only.  MiraLax (polyethylene glycol) - Pick up over-the-counter to have on hand. MiraLax is a solution that will increase the amount of water in your bowels to assist with bowel movements.  Take as directed and can mix with a glass of water, juice, soda, coffee, or tea. Take if you go more than two days without a movement. Do not use MiraLax more than once per day. Call your doctor if you are still constipated or irregular after using this medication for 7 days in a row.  If you continue  to have problems with postoperative constipation, please contact the office for further assistance and recommendations.  If you experience "the worst abdominal pain ever" or develop nausea or vomiting, please contact the office immediatly for further recommendations for treatment.  ITCHING If you experience itching with your medications, try taking only a single pain pill, or even half a pain pill at a time.  You can also use Benadryl over the counter for itching or also to help with sleep.   MEDICATIONS See your medication summary on the "After Visit Summary" that the nursing staff will review with you prior to discharge.  You may have some home medications which will be placed on hold until you complete the course of blood thinner medication.  It is important for you to complete the blood thinner medication as prescribed by your surgeon.  Continue your approved medications as instructed at time of discharge.  PRECAUTIONS If you experience chest pain or shortness of breath - call 911 immediately for transfer to the hospital emergency department.  If you develop a fever greater that 101 F, purulent  drainage from wound, increased redness or drainage from wound, foul odor from the wound/dressing, or calf pain - CONTACT YOUR SURGEON.                                                   FOLLOW-UP APPOINTMENTS Make sure you keep all of your appointments after your operation with your surgeon and caregivers. You should call the office at the above phone number and make an appointment for approximately two weeks after the date of your surgery or on the date instructed by your surgeon outlined in the "After Visit Summary".  RANGE OF MOTION AND STRENGTHENING EXERCISES  Rehabilitation of the knee is important following a knee injury or an operation. After just a few days of immobilization, the muscles of the thigh which control the knee become weakened and shrink (atrophy). Knee exercises are designed to build up  the tone and strength of the thigh muscles and to improve knee motion. Often times heat used for twenty to thirty minutes before working out will loosen up your tissues and help with improving the range of motion but do not use heat for the first two weeks following surgery. These exercises can be done on a training (exercise) mat, on the floor, on a table or on a bed. Use what ever works the best and is most comfortable for you Knee exercises include:  Leg Lifts - While your knee is still immobilized in a splint or cast, you can do straight leg raises. Lift the leg to 60 degrees, hold for 3 sec, and slowly lower the leg. Repeat 10-20 times 2-3 times daily. Perform this exercise against resistance later as your knee gets better.  Quad and Hamstring Sets - Tighten up the muscle on the front of the thigh (Quad) and hold for 5-10 sec. Repeat this 10-20 times hourly. Hamstring sets are done by pushing the foot backward against an object and holding for 5-10 sec. Repeat as with quad sets.  Leg Slides: Lying on your back, slowly slide your foot toward your buttocks, bending your knee up off the floor (only go as far as is comfortable). Then slowly slide your foot back down until your leg is flat on the floor again. Angel Wings: Lying on your back spread your legs to the side as far apart as you can without causing discomfort.  A rehabilitation program following serious knee injuries can speed recovery and prevent re-injury in the future due to weakened muscles. Contact your doctor or a physical therapist for more information on knee rehabilitation.   POST-OPERATIVE OPIOID TAPER INSTRUCTIONS: It is important to wean off of your opioid medication as soon as possible. If you do not need pain medication after your surgery it is ok to stop day one. Opioids include: Codeine, Hydrocodone(Norco, Vicodin), Oxycodone(Percocet, oxycontin) and hydromorphone amongst others.  Long term and even short term use of opiods can  cause: Increased pain response Dependence Constipation Depression Respiratory depression And more.  Withdrawal symptoms can include Flu like symptoms Nausea, vomiting And more Techniques to manage these symptoms Hydrate well Eat regular healthy meals Stay active Use relaxation techniques(deep breathing, meditating, yoga) Do Not substitute Alcohol to help with tapering If you have been on opioids for less than two weeks and do not have pain than it is ok to stop all together.  Plan to   wean off of opioids This plan should start within one week post op of your joint replacement. Maintain the same interval or time between taking each dose and first decrease the dose.  Cut the total daily intake of opioids by one tablet each day Next start to increase the time between doses. The last dose that should be eliminated is the evening dose.   IF YOU ARE TRANSFERRED TO A SKILLED REHAB FACILITY If the patient is transferred to a skilled rehab facility following release from the hospital, a list of the current medications will be sent to the facility for the patient to continue.  When discharged from the skilled rehab facility, please have the facility set up the patient's [place] prior to being released. Also, the skilled facility will be responsible for providing the patient with their medications at time of release from the facility to include their pain medication, the muscle relaxants, and their blood thinner medication. If the patient is still at the rehab facility at time of the two week follow up appointment, the skilled rehab facility will also need to assist the patient in arranging follow up appointment in our office and any transportation needs.  MAKE SURE YOU:  Understand these instructions.  Get help right away if you are not doing well or get worse.   DENTAL ANTIBIOTICS:  In most cases prophylactic antibiotics for Dental procdeures after total joint surgery are  not necessary.  Exceptions are as follows:  1. History of prior total joint infection  2. Severely immunocompromised (Organ Transplant, cancer chemotherapy, Rheumatoid biologic meds such as [place])  3. Poorly controlled diabetes (A1C &gt; 8.0, blood glucose over 200)  If you have one of these conditions, contact your surgeon for an antibiotic prescription, prior to your dental procedure.    Pick up stool softner and laxative for home use following surgery while on pain medications. Do not submerge incision under water. Please use good hand washing techniques while changing dressing each day. May shower starting three days after surgery. Please use a clean towel to pat the incision dry following showers. Continue to use ice for pain and swelling after surgery. Do not use any lotions or creams on the incision until instructed by your surgeon.  Information on my medicine - XARELTO (Rivaroxaban)  This medication education was reviewed with me or my healthcare representative as part of my discharge preparation.  The pharmacist that spoke with me during my hospital stay was:    Why was Xarelto prescribed for you? Xarelto was prescribed for you to reduce the risk of blood clots forming after orthopedic surgery. The medical term for these abnormal blood clots is venous thromboembolism (VTE).  What do you need to know about xarelto ? Take your Xarelto ONCE DAILY at the same time every day. You may take it either with or without food.  If you have difficulty swallowing the tablet whole, you may crush it and mix in applesauce just prior to taking your dose.  Take Xarelto exactly as prescribed by your doctor and DO NOT stop taking Xarelto without talking to the doctor who prescribed the medication.  Stopping without other VTE prevention medication to take the place of Xarelto may increase your risk of developing a clot.  After discharge, you should have regular check-up appointments  with your healthcare provider that is prescribing your Xarelto.    What do you do if you miss a dose? If you miss a dose, take it as soon  as you remember on the same day then continue your regularly scheduled once daily regimen the next day. Do not take two doses of Xarelto on the same day.   Important Safety Information A possible side effect of Xarelto is bleeding. You should call your healthcare provider right away if you experience any of the following: Bleeding from an injury or your nose that does not stop. Unusual colored urine (red or dark brown) or unusual colored stools (red or black). Unusual bruising for unknown reasons. A serious fall or if you hit your head (even if there is no bleeding).  Some medicines may interact with Xarelto and might increase your risk of bleeding while on Xarelto. To help avoid this, consult your healthcare provider or pharmacist prior to using any new prescription or non-prescription medications, including herbals, vitamins, non-steroidal anti-inflammatory drugs (NSAIDs) and supplements.  This website has more information on Xarelto: [email or web address]

## 2021-03-26 NOTE — Transfer of Care (Signed)
Immediate Anesthesia Transfer of Care Note  Patient: Shannon Hancock  Procedure(s) Performed: TOTAL KNEE ARTHROPLASTY (Right: Knee)  Patient Location: PACU  Anesthesia Type:Spinal  Level of Consciousness: sedated, patient cooperative and responds to stimulation  Airway & Oxygen Therapy: Patient Spontanous Breathing and Patient connected to face mask oxygen  Post-op Assessment: Report given to RN and Post -op Vital signs reviewed and stable  Post vital signs: Reviewed and stable  Last Vitals:  Vitals Value Taken Time  BP 85/48 03/26/21 0938  Temp    Pulse 61 03/26/21 0940  Resp 12 03/26/21 0940  SpO2 100 % 03/26/21 0940  Vitals shown include unvalidated device data.  Last Pain:  Vitals:   03/26/21 1007  TempSrc: Oral  PainSc:       Patients Stated Pain Goal: 4 (06/18/74 8832)  Complications: No notable events documented.

## 2021-03-26 NOTE — Anesthesia Preprocedure Evaluation (Signed)
Anesthesia Evaluation  Patient identified by MRN, date of birth, ID band Patient awake    Reviewed: Allergy & Precautions, NPO status , Patient's Chart, lab work & pertinent test results  Airway Mallampati: II  TM Distance: >3 FB Neck ROM: Full    Dental no notable dental hx.    Pulmonary neg pulmonary ROS, former smoker,    Pulmonary exam normal breath sounds clear to auscultation       Cardiovascular hypertension, Pt. on medications Normal cardiovascular exam Rhythm:Regular Rate:Normal     Neuro/Psych negative neurological ROS  negative psych ROS   GI/Hepatic Neg liver ROS, GERD  ,  Endo/Other  diabetes  Renal/GU negative Renal ROS  negative genitourinary   Musculoskeletal  (+) Arthritis , Osteoarthritis,    Abdominal   Peds negative pediatric ROS (+)  Hematology negative hematology ROS (+)   Anesthesia Other Findings   Reproductive/Obstetrics negative OB ROS                             Anesthesia Physical Anesthesia Plan  ASA: 2  Anesthesia Plan: Spinal   Post-op Pain Management:  Regional for Post-op pain   Induction: Intravenous  PONV Risk Score and Plan: 2 and Ondansetron, Dexamethasone, Propofol infusion and Treatment may vary due to age or medical condition  Airway Management Planned: Simple Face Mask  Additional Equipment:   Intra-op Plan:   Post-operative Plan:   Informed Consent: I have reviewed the patients History and Physical, chart, labs and discussed the procedure including the risks, benefits and alternatives for the proposed anesthesia with the patient or authorized representative who has indicated his/her understanding and acceptance.     Dental advisory given  Plan Discussed with: CRNA and Surgeon  Anesthesia Plan Comments:         Anesthesia Quick Evaluation

## 2021-03-26 NOTE — Anesthesia Procedure Notes (Signed)
Anesthesia Regional Block: Adductor canal block   Pre-Anesthetic Checklist: , timeout performed,  Correct Patient, Correct Site, Correct Laterality,  Correct Procedure, Correct Position, site marked,  Risks and benefits discussed,  Surgical consent,  Pre-op evaluation,  At surgeon's request and post-op pain management  Laterality: Right  Prep: chloraprep       Needles:  Injection technique: Single-shot  Needle Type: Echogenic Needle     Needle Length: 9cm      Additional Needles:   Procedures:,,,, ultrasound used (permanent image in chart),,    Narrative:  Start time: 03/26/2021 7:52 AM End time: 03/26/2021 7:59 AM Injection made incrementally with aspirations every 5 mL.  Performed by: Personally  Anesthesiologist: Myrtie Soman, MD  Additional Notes: Patient tolerated the procedure well without complications

## 2021-03-26 NOTE — Plan of Care (Signed)

## 2021-03-26 NOTE — Care Plan (Signed)
Ortho Bundle Case Management Note  Patient Details  Name: LERONDA LEWERS MRN: [id number] Date of Birth: Mar 20, 1951                  R TKA on 03/26/21. DCP: Home with husband and sister. 1 story home with 4-5 steps. DME: RW & 3in1 ordered through [facility]. PT: [facility] 9/29   DME Arranged:  3-N-1, [place]ford Rile rolling DME Agency:  [employer]  HH Arranged:    [place] Agency:     Additional Comments: Please contact me with any questions of if this plan should need to change.  Marianne Sofia, RN,CCM [facility]  [phone] 03/26/2021, 12:48 PM

## 2021-03-26 NOTE — Plan of Care (Signed)
  Problem: Education: Goal: Knowledge of General Education information will improve Description: Including pain rating scale, medication(s)/side effects and non-pharmacologic comfort measures Outcome: Progressing   Problem: Activity: Goal: Risk for activity intolerance will decrease Outcome: Progressing   Problem: Nutrition: Goal: Adequate nutrition will be maintained Outcome: Progressing   Problem: Elimination: Goal: Will not experience complications related to bowel motility Outcome: Progressing   Problem: Safety: Goal: Ability to remain free from injury will improve Outcome: Progressing   Problem: Activity: Goal: Ability to avoid complications of mobility impairment will improve Outcome: Progressing Goal: Range of joint motion will improve Outcome: Progressing   Problem: Pain Management: Goal: Pain level will decrease with appropriate interventions Outcome: Progressing

## 2021-03-26 NOTE — Anesthesia Procedure Notes (Signed)
Anesthesia Procedure Image    

## 2021-03-27 ENCOUNTER — Encounter (HOSPITAL_COMMUNITY): Payer: Self-pay | Admitting: Orthopedic Surgery

## 2021-03-27 DIAGNOSIS — I10 Essential (primary) hypertension: Secondary | ICD-10-CM | POA: Diagnosis not present

## 2021-03-27 DIAGNOSIS — E119 Type 2 diabetes mellitus without complications: Secondary | ICD-10-CM | POA: Diagnosis not present

## 2021-03-27 DIAGNOSIS — Z87891 Personal history of nicotine dependence: Secondary | ICD-10-CM | POA: Diagnosis not present

## 2021-03-27 DIAGNOSIS — Z853 Personal history of malignant neoplasm of breast: Secondary | ICD-10-CM | POA: Diagnosis not present

## 2021-03-27 DIAGNOSIS — Z79899 Other long term (current) drug therapy: Secondary | ICD-10-CM | POA: Diagnosis not present

## 2021-03-27 DIAGNOSIS — M1711 Unilateral primary osteoarthritis, right knee: Secondary | ICD-10-CM | POA: Diagnosis not present

## 2021-03-27 LAB — BASIC METABOLIC PANEL
Anion gap: 15 (ref 5–15)
BUN: 10 mg/dL (ref 8–23)
CO2: 21 mmol/L — ABNORMAL LOW (ref 22–32)
Calcium: 8.9 mg/dL (ref 8.9–10.3)
Chloride: 101 mmol/L (ref 98–111)
Creatinine, Ser: 0.82 mg/dL (ref 0.44–1.00)
GFR, Estimated: >60 mL/min (ref >60)
Glucose, Bld: 114 mg/dL — ABNORMAL HIGH (ref 70–99)
Potassium: 4.1 mmol/L (ref 3.5–5.1)
Sodium: 137 mmol/L (ref 135–145)

## 2021-03-27 LAB — CBC
HCT: 32.6 % — ABNORMAL LOW (ref 36.0–46.0)
Hemoglobin: 10.8 g/dL — ABNORMAL LOW (ref 12.0–15.0)
MCH: 26.5 pg (ref 26.0–34.0)
MCHC: 33.1 g/dL (ref 30.0–36.0)
MCV: 80.1 fL (ref 80.0–100.0)
Platelets: 278 10*3/uL (ref 150–400)
RBC: 4.07 MIL/uL (ref 3.87–5.11)
RDW: 13.7 % (ref 11.5–15.5)
WBC: 15.3 10*3/uL — ABNORMAL HIGH (ref 4.0–10.5)
nRBC: 0 % (ref 0.0–0.2)

## 2021-03-27 MED ORDER — HYDROMORPHONE HCL 2 MG PO TABS: 2.0000 mg | ORAL_TABLET | Freq: Four times a day (QID) | ORAL | 0 refills | Status: DC | PRN

## 2021-03-27 MED ORDER — RIVAROXABAN 10 MG PO TABS: 10.0000 mg | ORAL_TABLET | Freq: Every day | ORAL | 0 refills | Status: AC

## 2021-03-27 MED ORDER — SODIUM CHLORIDE 0.9 % IV BOLUS
500.0000 mL | Freq: Once | INTRAVENOUS | Status: AC
  Administered 2021-03-27: 500 mL via INTRAVENOUS

## 2021-03-27 MED ORDER — METHOCARBAMOL 500 MG PO TABS: 500.0000 mg | ORAL_TABLET | Freq: Four times a day (QID) | ORAL | 0 refills | Status: DC | PRN

## 2021-03-27 NOTE — TOC Transition Note (Signed)
Transition of Care [facility]) - CM/SW Discharge Note   Patient Details  Name: Shannon Hancock MRN: [id number] Date of Birth: [birth year]  [facility] CM/SW Contact:  Lennart Pall, LCSW Phone Number: 03/27/2021, 1:13 PM   Clinical Narrative:    Met with pt and confirming rw received via [place].  Plan to borrow 3n1 commode.  OPPT set up with [facility].  No further TOC needs.   Final next level of care: OP Rehab Barriers to Discharge: No Barriers Identified   Patient Goals and CMS Choice Patient states their goals for this hospitalization and ongoing recovery are:: return home      Discharge Placement                       Discharge Plan and Services                DME Arranged: 3-N-1 (to borrow from friend), [place] rolling DME Agency: [facility]                  Social Determinants of Health (SDOH) Interventions     Readmission Risk Interventions No flowsheet data found.

## 2021-03-27 NOTE — Progress Notes (Signed)
Physical Therapy Treatment Patient Details Name: Shannon Hancock MRN: [id number] DOB: 09/14/1950 Today's Date: 03/27/2021   History of Present Illness 70 y.o. female s/p R TKA. PMH: fibromyalgia, depression, breast CA, anxiety, HTN    PT Comments    Progressing toward goals. Will see for a second session and should be ready for d/c later today    Recommendations for follow up therapy are one component of a multi-disciplinary discharge planning process, led by the attending physician.  Recommendations may be updated based on patient status, additional functional criteria and insurance authorization.  Follow Up Recommendations  Follow surgeon's recommendation for DC plan and follow-up therapies     Equipment Recommendations  Rolling walker with 5" wheels    Recommendations for Other Services       Precautions / Restrictions Precautions Precautions: Fall;Knee Required Braces or Orthoses: Knee Immobilizer - Right Knee Immobilizer - Right: Discontinue once straight leg raise with < 10 degree lag Restrictions Weight Bearing Restrictions: No Other Position/Activity Restrictions: WBAT     Mobility  Bed Mobility Overal bed mobility: Needs Assistance Bed Mobility: Supine to Sit     Supine to sit: Min guard     General bed mobility comments: incr time, cues to self assist    Transfers Overall transfer level: Needs assistance Equipment used: Rolling walker (2 wheeled) Transfers: Sit to/from Stand Sit to Stand: Min guard;Supervision         General transfer comment: cues for hand placement and RLE position  Ambulation/Gait Ambulation/Gait assistance: Min guard Gait Distance (Feet): 80 Feet Assistive device: Rolling walker (2 wheeled) Gait Pattern/deviations: Step-to pattern;Decreased stance time - right;Trunk flexed     General Gait Details: cues for sequence and RW position   Stairs             Wheelchair Mobility    Modified Rankin (Stroke  Patients Only)       Balance                                            Cognition Arousal/Alertness: Awake/alert Behavior During Therapy: WFL for tasks assessed/performed Overall Cognitive Status: Within Functional Limits for tasks assessed                                        Exercises Total Joint Exercises Ankle Circles/Pumps: AROM;Both;10 reps Quad Sets: AROM;Right;10 reps Heel Slides: AAROM;Right;10 reps    General Comments        Pertinent Vitals/Pain Pain Assessment: 0-10 Pain Score: 5  Pain Location: right knee Pain Descriptors / Indicators: Grimacing;Sore;Tightness;Aching Pain Intervention(s): Limited activity within patient's tolerance;Monitored during session;Premedicated before session;Repositioned;Ice applied    Home Living                      Prior Function            PT Goals (current goals can now be found in the care plan section) Acute Rehab PT Goals Patient Stated Goal: less pain PT Goal Formulation: With patient Time For Goal Achievement: 04/02/21 Progress towards PT goals: Progressing toward goals    Frequency    7X/week      PT Plan Current plan remains appropriate    Co-evaluation  AM-PAC PT "6 Clicks" Mobility   Outcome Measure  Help needed turning from your back to your side while in a flat bed without using bedrails?: A Little Help needed moving from lying on your back to sitting on the side of a flat bed without using bedrails?: A Little Help needed moving to and from a bed to a chair (including a wheelchair)?: A Little Help needed standing up from a chair using your arms (e.g., wheelchair or bedside chair)?: A Little Help needed to walk in hospital room?: A Little Help needed climbing 3-5 steps with a railing? : A Little 6 Click Score: 18    End of Session Equipment Utilized During Treatment: Gait belt;Right knee immobilizer Activity Tolerance: Patient  tolerated treatment well Patient left: in chair;with call bell/phone within reach;with chair alarm set Nurse Communication: Mobility status PT Visit Diagnosis: Other abnormalities of gait and mobility (R26.89);Difficulty in walking, not elsewhere classified (R26.2)     Time: 0539-7673 PT Time Calculation (min) (ACUTE ONLY): 19 min  Charges:  $Gait Training: 8-22 mins                     Baxter Flattery, PT  Acute Rehab Dept ([place]) [phone] Pager [phone]  03/27/2021    [facility] 03/27/2021, 10:36 AM

## 2021-03-27 NOTE — Progress Notes (Signed)
Patient was unable to meet PT goals Cathey Endow PA notified D Mateo Flow RN

## 2021-03-27 NOTE — Progress Notes (Signed)
Subjective: 1 Day Post-Op Procedure(s) (LRB): TOTAL KNEE ARTHROPLASTY (Right) Shannon Hancock reports pain as mild.   Shannon Hancock seen in rounds by Dr. Wynelle Link. Shannon Hancock is well, and has had no acute complaints or problems other than pain in the right knee. Denies chest pain or SOB. No issues overnight. Foley catheter removed this AM. We will continue therapy today, ambulated 48' yesterday.   Objective: Vital signs in last 24 hours: Temp:  [97 F (36.1 C)-98 F (36.7 C)] 98 F (36.7 C) (09/27 0522) Pulse Rate:  [56-81] 69 (09/27 0522) Resp:  [7-21] 16 (09/27 0522) BP: (85-131)/(48-65) 103/50 (09/27 0522) SpO2:  [93 %-100 %] 100 % (09/27 0522)  Intake/Output from previous day:  Intake/Output Summary (Last 24 hours) at 03/27/2021 0739 Last data filed at 03/27/2021 8341 Gross per 24 hour  Intake 3053.75 ml  Output 4925 ml  Net -1871.25 ml     Intake/Output this shift: Total I/O In: -  Out: 1100 [Urine:1100]  Labs: Recent Labs    03/26/21 0610  HGB 12.3   Recent Labs    03/26/21 0610  WBC 9.5  RBC 4.70  HCT 40.2  PLT 300   Recent Labs    03/26/21 0610  NA 133*  K 4.4  CL 97*  CO2 24  BUN 12  CREATININE 0.68  GLUCOSE 88  CALCIUM 9.4   Recent Labs    03/26/21 0610  INR 0.9    Exam: General - Shannon Hancock is Alert and Oriented Extremity - Neurologically intact Neurovascular intact Sensation intact distally Dorsiflexion/Plantar flexion intact Dressing - dressing C/D/I Motor Function - intact, moving foot and toes well on exam.   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   Weight as of this encounter: 80.3 kg. Advance diet Up with therapy   Shannon Hancock's anticipated LOS is less than 2 midnights, meeting these requirements: - Lives within 1 hour of care - Has a competent adult at home to recover with post-op recover - NO history of  - Coronary Artery Disease  - Heart failure  - Heart attack  - Stroke  - DVT/VTE  - Cardiac arrhythmia  - Respiratory Failure/COPD  - Renal failure  - Anemia  - Advanced Liver disease  DVT Prophylaxis - Xarelto Weight bearing as tolerated. Continue therapy.  BP soft this AM, 500 mL bolus ordered.  Plan is to go Home after hospital stay. Plan for discharge today once cleared by physical therapy. Scheduled for OPP[facility] at [facility]) Follow-up in the office in 2 weeks  The [place] was reviewed today prior to any opioid medications being prescribed to this Shannon Hancock.  Theresa Duty, PA-C Orthopedic Surgery [phone] 03/27/2021, 7:39 AM

## 2021-03-27 NOTE — Progress Notes (Signed)
   03/27/21 1300  PT Visit Information  Last PT Received On 03/27/21  Pt amb to bathroom and declined further activity or stair practice d/t fatigue and "having chills" provided with warm blankets. RN is aware. Not meeting goals d/t decr participation this pm  Assistance Needed +1  History of Present Illness 70 y.o. female s/p R TKA. PMH: fibromyalgia, depression, breast CA, anxiety, HTN  Subjective Data  Patient Stated Goal less pain  Precautions  Precautions Fall;Knee  Required Braces or Orthoses Knee Immobilizer - Right  Knee Immobilizer - Right Discontinue once straight leg raise with < 10 degree lag  Restrictions  Other Position/Activity Restrictions WBAT  Pain Assessment  Pain Assessment 0-10  Pain Score 4  Pain Location right knee  Pain Descriptors / Indicators Grimacing;Sore;Tightness;Aching  Pain Intervention(s) Limited activity within patient's tolerance;Monitored during session;Premedicated before session;Repositioned  Cognition  Arousal/Alertness Awake/alert  Behavior During Therapy WFL for tasks assessed/performed  Overall Cognitive Status Within Functional Limits for tasks assessed  Bed Mobility  Overal bed mobility Needs Assistance  Bed Mobility Supine to Sit;Sit to Supine  Supine to sit Min guard  Sit to supine Min guard  General bed mobility comments incr time, cues to self assist  Transfers  Overall transfer level Needs assistance  Equipment used Rolling walker (2 wheeled)  Transfers Sit to/from Stand  Sit to Stand Min guard  General transfer comment cues for hand placement and RLE position  Ambulation/Gait  Ambulation/Gait assistance Min guard  Gait Distance (Feet) 12 Feet (10')  Assistive device Rolling walker (2 wheeled)  Gait Pattern/deviations Step-to pattern;Decreased stance time - right;Trunk flexed  General Gait Details cues for sequence and RW position, excessive R knee flexion throughout gait cycle  Stairs  (pt declined)  PT - End of Session   Equipment Utilized During Treatment Gait belt;Right knee immobilizer  Activity Tolerance Patient tolerated treatment well  Patient left in chair;with call bell/phone within reach;with chair alarm set  Nurse Communication Mobility status   PT - Assessment/Plan  PT Plan Current plan remains appropriate  PT Visit Diagnosis Other abnormalities of gait and mobility (R26.89);Difficulty in walking, not elsewhere classified (R26.2)  PT Frequency (ACUTE ONLY) 7X/week  Follow Up Recommendations Follow surgeon's recommendation for DC plan and follow-up therapies  PT equipment Rolling walker with 5" wheels  AM-PAC PT "6 Clicks" Mobility Outcome Measure (Version 2)  Help needed turning from your back to your side while in a flat bed without using bedrails? 3  Help needed moving from lying on your back to sitting on the side of a flat bed without using bedrails? 3  Help needed moving to and from a bed to a chair (including a wheelchair)? 3  Help needed standing up from a chair using your arms (e.g., wheelchair or bedside chair)? 3  Help needed to walk in hospital room? 3  Help needed climbing 3-5 steps with a railing?  3  6 Click Score 18  Consider Recommendation of Discharge To: Home with [facility]  PT Goal Progression  Progress towards PT goals Progressing toward goals  Acute Rehab PT Goals  PT Goal Formulation With patient  Time For Goal Achievement 04/02/21  PT Time Calculation  PT Start Time (ACUTE ONLY) 1334  PT Stop Time (ACUTE ONLY) 1351  PT Time Calculation (min) (ACUTE ONLY) 17 min  PT General Charges  $$ ACUTE PT VISIT 1 Visit  PT Treatments  $Gait Training 8-22 mins

## 2021-03-28 DIAGNOSIS — I10 Essential (primary) hypertension: Secondary | ICD-10-CM | POA: Diagnosis not present

## 2021-03-28 DIAGNOSIS — M1711 Unilateral primary osteoarthritis, right knee: Secondary | ICD-10-CM | POA: Diagnosis not present

## 2021-03-28 DIAGNOSIS — Z87891 Personal history of nicotine dependence: Secondary | ICD-10-CM | POA: Diagnosis not present

## 2021-03-28 DIAGNOSIS — Z79899 Other long term (current) drug therapy: Secondary | ICD-10-CM | POA: Diagnosis not present

## 2021-03-28 DIAGNOSIS — Z853 Personal history of malignant neoplasm of breast: Secondary | ICD-10-CM | POA: Diagnosis not present

## 2021-03-28 DIAGNOSIS — E119 Type 2 diabetes mellitus without complications: Secondary | ICD-10-CM | POA: Diagnosis not present

## 2021-03-28 LAB — BASIC METABOLIC PANEL
Anion gap: 9 (ref 5–15)
BUN: 10 mg/dL (ref 8–23)
CO2: 26 mmol/L (ref 22–32)
Calcium: 8.3 mg/dL — ABNORMAL LOW (ref 8.9–10.3)
Chloride: 97 mmol/L — ABNORMAL LOW (ref 98–111)
Creatinine, Ser: 0.59 mg/dL (ref 0.44–1.00)
GFR, Estimated: >60 mL/min (ref >60)
Glucose, Bld: 117 mg/dL — ABNORMAL HIGH (ref 70–99)
Potassium: 3 mmol/L — ABNORMAL LOW (ref 3.5–5.1)
Sodium: 132 mmol/L — ABNORMAL LOW (ref 135–145)

## 2021-03-28 LAB — CBC
HCT: 28.3 % — ABNORMAL LOW (ref 36.0–46.0)
Hemoglobin: 9.4 g/dL — ABNORMAL LOW (ref 12.0–15.0)
MCH: 26.3 pg (ref 26.0–34.0)
MCHC: 33.2 g/dL (ref 30.0–36.0)
MCV: 79.3 fL — ABNORMAL LOW (ref 80.0–100.0)
Platelets: 271 10*3/uL (ref 150–400)
RBC: 3.57 MIL/uL — ABNORMAL LOW (ref 3.87–5.11)
RDW: 13.8 % (ref 11.5–15.5)
WBC: 14.1 10*3/uL — ABNORMAL HIGH (ref 4.0–10.5)
nRBC: 0 % (ref 0.0–0.2)

## 2021-03-28 MED ORDER — POTASSIUM CHLORIDE CRYS ER 20 MEQ PO TBCR
40.0000 meq | EXTENDED_RELEASE_TABLET | ORAL | Status: AC
Start: 2021-03-28 — End: 2021-03-28
  Administered 2021-03-28 (×2): 40 meq via ORAL
  Filled 2021-03-28 (×2): qty 2

## 2021-03-28 NOTE — Progress Notes (Signed)
Physical Therapy Treatment Patient Details Name: Shannon Hancock MRN: [id number] DOB: Jun 23, 1951 Today's Date: 03/28/2021   History of Present Illness 70 y.o. female s/p R TKA. PMH: fibromyalgia, depression, breast CA, anxiety, HTN    PT Comments    Pt progressing with PT. Reviewed stairs, handouts given. Pt feels ready to d/c with family assist as needed. Advised pt to utilize son and dtr as well as gait belt for safety when going up stairs.   Recommendations for follow up therapy are one component of a multi-disciplinary discharge planning process, led by the attending physician.  Recommendations may be updated based on patient status, additional functional criteria and insurance authorization.  Follow Up Recommendations  Follow surgeon's recommendation for DC plan and follow-up therapies     Equipment Recommendations  Rolling walker with 5" wheels    Recommendations for Other Services       Precautions / Restrictions Precautions Precautions: Fall;Knee Required Braces or Orthoses: Knee Immobilizer - Right Knee Immobilizer - Right: Discontinue once straight leg raise with < 10 degree lag Restrictions Weight Bearing Restrictions: No Other Position/Activity Restrictions: WBAT     Mobility  Bed Mobility Overal bed mobility: Needs Assistance Bed Mobility: Supine to Sit;Sit to Supine     Supine to sit: Min guard Sit to supine: Min assist   General bed mobility comments: incr time, cues to self assist; assist to lift RLE on to bed    Transfers Overall transfer level: Needs assistance Equipment used: Rolling walker (2 wheeled) Transfers: Sit to/from Stand Sit to Stand: Min guard         General transfer comment: cues for hand placement and RLE position  Ambulation/Gait Ambulation/Gait assistance: Min guard Gait Distance (Feet): 30 Feet Assistive device: Rolling walker (2 wheeled) Gait Pattern/deviations: Step-to pattern;Decreased stance time -  right;Antalgic     General Gait Details: cues for sequence and RW position, limted WBing on RLE d/t pain   Stairs Stairs: Yes Stairs assistance: Min assist Stair Management: One rail Right;Step to pattern;With walker;Sideways;Forwards Number of Stairs: 3 (up 2 down 3) General stair comments: cues for sequence and to self assist, min to min/guard assist to steady RW and rail to ascend, single rail to descend   Wheelchair Mobility    Modified Rankin (Stroke Patients Only)       Balance                                            Cognition Arousal/Alertness: Awake/alert Behavior During Therapy: WFL for tasks assessed/performed Overall Cognitive Status: Within Functional Limits for tasks assessed                                        Exercises      General Comments        Pertinent Vitals/Pain Pain Assessment: Faces Faces Pain Scale: Hurts even more Pain Location: right knee Pain Descriptors / Indicators: Grimacing;Sore;Tightness;Aching Pain Intervention(s): Limited activity within patient's tolerance;Monitored during session;Premedicated before session;Repositioned    Home Living                      Prior Function            PT Goals (current goals can now be found in the care plan section) Acute  Rehab PT Goals Patient Stated Goal: less pain PT Goal Formulation: With patient Time For Goal Achievement: 04/02/21 Progress towards PT goals: Progressing toward goals    Frequency    7X/week      PT Plan Current plan remains appropriate    Co-evaluation              AM-PAC PT "6 Clicks" Mobility   Outcome Measure  Help needed turning from your back to your side while in a flat bed without using bedrails?: A Little Help needed moving from lying on your back to sitting on the side of a flat bed without using bedrails?: A Little Help needed moving to and from a bed to a chair (including a wheelchair)?: A  Little Help needed standing up from a chair using your arms (e.g., wheelchair or bedside chair)?: A Little Help needed to walk in hospital room?: A Little Help needed climbing 3-5 steps with a railing? : A Little 6 Click Score: 18    End of Session Equipment Utilized During Treatment: Gait belt;Right knee immobilizer Activity Tolerance: Patient tolerated treatment well;Patient limited by pain Patient left: in bed;with call bell/phone within reach;with bed alarm set   PT Visit Diagnosis: Other abnormalities of gait and mobility (R26.89);Difficulty in walking, not elsewhere classified (R26.2)     Time: 4174-0814 PT Time Calculation (min) (ACUTE ONLY): 26 min  Charges:  $Gait Training: 23-37 mins                     Baxter Flattery, PT  Acute Rehab Dept ([place]) [phone] Pager [phone]  03/28/2021    [facility] 03/28/2021, 11:34 AM

## 2021-03-28 NOTE — Progress Notes (Signed)
   Subjective: 2 Days Post-Op Procedure(s) (LRB): TOTAL KNEE ARTHROPLASTY (Right) Patient reports pain as mild.   Patient seen in rounds by Dr. Wynelle Link. Patient is well, and has had no acute complaints or problems. Denies SOB, chest pain, or calf pain. No acute overnight events. Ambulated 80 feet with therapy yesterday. Will continue therapy today.   Plan is to go Home after hospital stay.  Objective: Vital signs in last 24 hours: Temp:  [98 F (36.7 C)-99 F (37.2 C)] 99 F (37.2 C) (09/28 0527) Pulse Rate:  [70-98] 92 (09/28 0527) Resp:  [16-18] 16 (09/28 0527) BP: (113-136)/(63-91) 127/63 (09/28 0527) SpO2:  [99 %-100 %] 99 % (09/28 0527)  Intake/Output from previous day:  Intake/Output Summary (Last 24 hours) at 03/28/2021 0756 Last data filed at 03/28/2021 0527 Gross per 24 hour  Intake 60 ml  Output 2300 ml  Net -2240 ml    Intake/Output this shift: No intake/output data recorded.  Labs: Recent Labs    03/26/21 0610 03/27/21 1232 03/28/21 0330  HGB 12.3 10.8* 9.4*   Recent Labs    03/27/21 1232 03/28/21 0330  WBC 15.3* 14.1*  RBC 4.07 3.57*  HCT 32.6* 28.3*  PLT 278 271   Recent Labs    03/27/21 1232 03/28/21 0330  NA 137 132*  K 4.1 3.0*  CL 101 97*  CO2 21* 26  BUN 10 10  CREATININE 0.82 0.59  GLUCOSE 114* 117*  CALCIUM 8.9 8.3*   Recent Labs    03/26/21 0610  INR 0.9    Exam: General - Patient is Alert and Oriented Extremity - Neurologically intact Neurovascular intact Intact pulses distally Dorsiflexion/Plantar flexion intact Dressing/Incision - clean, dry, no drainage Motor Function - intact, moving foot and toes well on exam.   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   Weight as of this encounter: 80.3 kg. Up with therapy  DVT Prophylaxis - Xarelto and TED hose Weight-bearing as tolerated  Plan for two sessions with PT this morning, and if meeting goals, will plan for discharge this afternoon.   Patient to follow up in two weeks with Dr. Wynelle Link in clinic.   The PDMP database was reviewed today prior to any opioid medications being prescribed to this patient.Fenton Foy, MBA, PA-C Orthopedic Surgery 03/28/2021, 7:56 AM

## 2021-03-29 ENCOUNTER — Ambulatory Visit: Payer: Medicare PPO | Admitting: Physical Therapy

## 2021-04-02 ENCOUNTER — Ambulatory Visit: Payer: Medicare PPO | Attending: Sports Medicine | Admitting: Physical Therapy

## 2021-04-02 ENCOUNTER — Ambulatory Visit: Payer: Medicare PPO | Admitting: Physical Therapy

## 2021-04-02 ENCOUNTER — Encounter: Payer: Self-pay | Admitting: Physical Therapy

## 2021-04-02 DIAGNOSIS — M6281 Muscle weakness (generalized): Secondary | ICD-10-CM | POA: Diagnosis not present

## 2021-04-02 DIAGNOSIS — R278 Other lack of coordination: Secondary | ICD-10-CM | POA: Diagnosis not present

## 2021-04-02 DIAGNOSIS — R269 Unspecified abnormalities of gait and mobility: Secondary | ICD-10-CM | POA: Diagnosis not present

## 2021-04-02 DIAGNOSIS — R262 Difficulty in walking, not elsewhere classified: Secondary | ICD-10-CM | POA: Insufficient documentation

## 2021-04-02 DIAGNOSIS — R2681 Unsteadiness on feet: Secondary | ICD-10-CM | POA: Insufficient documentation

## 2021-04-02 DIAGNOSIS — R2689 Other abnormalities of gait and mobility: Secondary | ICD-10-CM | POA: Insufficient documentation

## 2021-04-02 DIAGNOSIS — Z96651 Presence of right artificial knee joint: Secondary | ICD-10-CM | POA: Insufficient documentation

## 2021-04-02 DIAGNOSIS — G8929 Other chronic pain: Secondary | ICD-10-CM | POA: Diagnosis not present

## 2021-04-02 DIAGNOSIS — M25561 Pain in right knee: Secondary | ICD-10-CM | POA: Diagnosis not present

## 2021-04-02 NOTE — Therapy (Signed)
[facility] [address] [place], [address] Phone: [phone]   Fax:  [phone]  Physical Therapy Evaluation  Patient Details  Name: Shannon Hancock MRN: [id number] Date of Birth: December 03, 1950 Referring Provider (PT): [name] (Sports medicine @ [facility])   Encounter Date: 04/02/2021   PT End of Session - 04/02/21 1020     Visit Number 1    Number of Visits 17    Date for PT Re-Evaluation 06/01/21    Authorization - Visit Number 1    Authorization - Number of Visits 10    PT Start Time 1025    PT Stop Time 1115    PT Time Calculation (min) 50 min    Equipment Utilized During Treatment Gait belt    Activity Tolerance Patient tolerated treatment well;Patient limited by pain;Patient limited by lethargy    Behavior During Therapy [facility] for tasks assessed/performed             Past Medical History:  Diagnosis Date   Allergy    Anxiety    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; ER/PR+, Her2-, 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%   Depression    Diabetes mellitus without complication (HCC)    Diverticulosis    Fibromyalgia    GERD (gastroesophageal reflux disease)    Heart murmur    "no murmur" documented in PCP note by [name], MD 02/09/21   High cholesterol    Hypertension    Joint pain     Past Surgical History:  Procedure Laterality Date   BREAST LUMPECTOMY Right 2016   BREAST LUMPECTOMY WITH RADIOACTIVE SEED AND SENTINEL LYMPH NODE BIOPSY Right 02/13/2015   Procedure: RIGHT BREAST LUMPECTOMY WITH RADIOACTIVE SEED AND SENTINEL LYMPH NODE MAPPING;  Surgeon: [name], MD;  Location: [facility];  Service: General;  Laterality: Right;   FOOT SURGERY Left    TOTAL KNEE ARTHROPLASTY Right 03/26/2021   Procedure: TOTAL KNEE ARTHROPLASTY;  Surgeon: [name], MD;  Location[facility];  Service: Orthopedics;  Laterality: Right;   TUBAL LIGATION      There were no  vitals filed for this visit.    Subjective Assessment - 04/02/21 1022     Subjective Shannon Hancock is a 70 y.o. female presenting to physical therapy s/p R TKA 03/26/21. Pt reports to therapy amb with RW. Pt resides in a two story home with six steps to enter from the front and a ramp in the back. She reports negotiating stairs with assistance from her husband. She reports being able to negotiate and live on the main story of her home. Pt reports she would like to increase her R knee mobility, strength, and decrease her pain in order to walk without AD and go bowling. Pt subjective limited due to lethargy. PMH includes breast cancer, fibromyalgia, DM, HTN, anxiety, depression, and hearing loss. Pt denies any unexplained weight fluctuation, saddle paresthesia, unrelenting night pain, or falls in the last 6 months.    Pertinent History Shannon Hancock is a 70 y.o. female presenting to physical therapy s/p R TKA 03/26/21. Pt reports to therapy amb with RW. Pt resides in a two story home with six steps to enter from the front and a ramp in the back. She reports negotiating stairs with assistance from her husband. She reports being able to negotiate and live on the main story of her home. Pt reports she would like to increase her R  knee mobility, strength, and decrease her pain in order to walk without AD and go bowling. PMH includes breast cancer, fibromyalgia, DM, HTN, anxiety, depression, and hearing loss. Pt denies any unexplained weight fluctuation, saddle paresthesia, unrelenting night pain, or falls in the last 6 months.    Limitations Lifting;Walking;Standing;House hold activities    How long can you sit comfortably? 5-10 min    How long can you stand comfortably? 5 min    How long can you walk comfortably? 5-8 minutes    Patient Stated Goals Returned and bowling and restore ROM    Currently in Pain? Yes    Pain Score 7     Pain Location Knee    Pain Orientation Right    Pain Descriptors /  Indicators Sharp;Aching    Pain Type Surgical pain    Pain Onset In the past 7 days    Pain Frequency Constant    Aggravating Factors  movement    Pain Relieving Factors prescription medication-    Effect of Pain on Daily Activities limited             Knee AROM L/R (Degrees)  Flex: L 120 / R 80 Ext:  L 0  / R 50  Knee PROM L/R (Degrees) Flex 120 /93 Ext 0 /25  Knee MMT L/R Flex   4/2+ Ext   4/2+  Hip AROM/PROM WFL bilat  Hip MMT  L/R Flex   4 /4- Abd   4-4- Add   4/4 Ext  4 /3- IR   4 /3- ER   4/3-  Ankle AROM  WFL bilat   Ankle MMT WFL bilat   Observation:   Gait: amb with RW, with step to gait pattern, with decrease stance time (wt acceptance) on R LE  Bed mobility: ModI difficulty rolling L<>R with increased UE use,  supine>sit minA for RLE management and sit > supine modI with increased time d/t pain   Posture: increased thoracic kyphosis, rounded shoulder, forward head, seated with R LE in flexion; Standing approx 60d R knee flex maintained with forefoot contact only    Skin integrity: R Knee bandage intact with minimal draining through dressing   Sensation: sensation intact bilat LE   Stair negotiation: step to step pattern with bilat UE use on single railing ascending; lateral step to step pattern descending  BUE use; CGA for safety   Test and Measures  5xSTS: 25 sec Single Leg Stance:10 sec on L LE       Objective measurements completed on examination: See above findings.       Ther-Ex PT reviewed the following HEP with patient with patient able to demonstrate a set of the following with min cuing for correction needed. PT educated patient on parameters of therex (how/when to inc/decrease intensity, frequency, rep/set range, stretch hold time, and purpose of therex) with verbalized understanding.  Supine knee ext; Heel prop 1-2 minx 5-8/day Heel Slides x 12 reps 5 sec holds 5-8/day Standing wt shifting with RW to R flat foot  x20 5-8x/day        PT Education - 04/02/21 1344     Education Details Patient was educated on anatomy involved, prognosis, role of PT, and was given an HEP, demonstrating exercise with proper form following verbal and tactile cues, and was given a paper hand out to continue exercise at home. Pt was educated on and agreed to plan of care.    Person(s) Educated Patient    Methods Explanation;Demonstration  Comprehension Verbalized understanding;Returned demonstration              PT Short Term Goals - 04/02/21 1332       PT SHORT TERM GOAL #1   Title Pt will demonstrate independence with HEP to improve R LE function for increased ability to participate with ADLs    Baseline HEP given    Time 4    Period Weeks    Status New    Target Date 04/30/21               PT Long Term Goals - 04/02/21 1333       PT LONG TERM GOAL #1   Title Patient will increase FOTO score to 49 to demonstrate predicted increase in functional mobility to complete ADLs    Baseline 04/02/21: 1    Time 12    Period Weeks    Status New    Target Date 06/25/21      PT LONG TERM GOAL #2   Title Pt will perform 5XSTS in 12 seconds in order to demonstrate improved LE strength and decrease the likelihood of falling.    Baseline 04/02/21: 25    Time 12    Period Weeks    Status New    Target Date 06/25/21      PT LONG TERM GOAL #3   Title Pt will actively demonstrate 0 deg R knee extension and 120 deg R knee flexion in order to safely negotiate steps and normalize gait without AD.    Baseline 04/02/21 R knee: flex 80 deg, ext: 50    Time 12    Period Weeks    Status New    Target Date 06/25/21      PT LONG TERM GOAL #4   Title Pt will demonstrate SLS of RLE of at least 10sec to demonstrate decreased fall risk and PLOF of contralateral LE    Baseline 04/02/21 LLE 10sec RLE unable    Time 8    Period Weeks    Status New                    Plan - 04/02/21 1306     Clinical  Impression Statement Patient is a 70 year old female s/p R TKA 03/26/21. Patient presents today with R LE weakness, decreased R Knee ROM, decreased balance, abnormal gait, and decreased activity tolerance. Activity limitations in sit to stand transfers, bed mobility and transfers, walking, stair negotiation, and standing prohibit participation in ADLs and community activities. Pt will benefit from skilled PT to address impairments, achieve set goals, and decrease likelihood of falling.    Personal Factors and Comorbidities Age;Comorbidity 2    Comorbidities DM, anxiety, depression    Examination-Activity Limitations Bed Mobility;Carry;Lift;Locomotion Level;Squat;Sit    Examination-Participation Restrictions Church;Cleaning;Community Activity;Laundry;Driving    Stability/Clinical Decision Making Evolving/Moderate complexity    Clinical Decision Making Moderate    Rehab Potential Good    PT Frequency 2x / week    PT Duration 12 weeks    PT Treatment/Interventions ADLs/Self Care Home Management;Gait training;Stair training;Functional mobility training;Therapeutic activities;Therapeutic exercise;Balance training;Patient/family education;Passive range of motion;Cryotherapy;Aquatic Therapy;Neuromuscular re-education;Manual techniques;Dry needling;Energy conservation;Scar mobilization;Electrical Stimulation    PT Next Visit Plan stationary bike; girth measurements    PT Home Exercise Plan Heel elevation, seated heel slides    Consulted and Agree with Plan of Care Patient             Patient will benefit from skilled therapeutic intervention in  order to improve the following deficits and impairments:  Abnormal gait, Decreased activity tolerance, Decreased balance, Decreased endurance, Decreased knowledge of use of DME, Decreased mobility, Decreased strength, Decreased range of motion, Hypomobility, Increased muscle spasms, Impaired flexibility, Decreased skin integrity, Difficulty walking, Improper body  mechanics, Pain  Visit Diagnosis: Total knee replacement status, right     Problem List Patient Active Problem List   Diagnosis Date Noted   OA (osteoarthritis) of knee 03/26/2021   Primary osteoarthritis of right knee 03/26/2021   Leukocytosis 04/19/2020   Thyroid cyst 02/26/2018   Plantar fasciitis 02/19/2016   Family history of breast cancer in sister 03/10/2015   Breast cancer of lower-outer quadrant of right female breast ([place]) 02/10/2015   Abdominal bloating 12/16/2013   Edema 11/09/2013   Urge incontinence 05/05/2013   Atrophic vaginitis 05/05/2013   Syncope 02/15/2013   Irritable bowel syndrome 08/20/2012   Hearing loss 11/07/2011   Generalized anxiety disorder 11/07/2011   Peripheral neuropathy 07/05/2011   Osteoporosis 07/05/2011   Tinnitus, bilateral 04/05/2011   HYPERCHOLESTEROLEMIA 04/05/2010   ANEMIA, IRON DEFICIENCY 04/03/2010   DEPRESSION, MAJOR, MODERATE 04/03/2010   Essential hypertension 04/03/2010   ALLERGIC RHINITIS 04/03/2010   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/03/2010   POSTMENOPAUSAL SYNDROME 04/03/2010   DEGENERATIVE JOINT DISEASE, CERVICAL SPINE 04/03/2010   FIBROMYALGIA 04/03/2010     Durwin Reges DPT Sharion Settler, SPT  Durwin Reges, PT 04/02/2021, 2:13 PM  [facility] [address] [place], [address] Phone: [phone]   Fax:  [phone]  Name: NAILEA WHITEHORN MRN: [id number] Date of Birth: March 22, 1951

## 2021-04-05 ENCOUNTER — Ambulatory Visit: Payer: Medicare PPO | Admitting: Physical Therapy

## 2021-04-05 ENCOUNTER — Other Ambulatory Visit: Payer: Self-pay

## 2021-04-05 DIAGNOSIS — R262 Difficulty in walking, not elsewhere classified: Secondary | ICD-10-CM | POA: Diagnosis not present

## 2021-04-05 DIAGNOSIS — R2681 Unsteadiness on feet: Secondary | ICD-10-CM | POA: Diagnosis not present

## 2021-04-05 DIAGNOSIS — G8929 Other chronic pain: Secondary | ICD-10-CM | POA: Diagnosis not present

## 2021-04-05 DIAGNOSIS — M6281 Muscle weakness (generalized): Secondary | ICD-10-CM | POA: Diagnosis not present

## 2021-04-05 DIAGNOSIS — Z96651 Presence of right artificial knee joint: Secondary | ICD-10-CM | POA: Diagnosis not present

## 2021-04-05 DIAGNOSIS — R2689 Other abnormalities of gait and mobility: Secondary | ICD-10-CM

## 2021-04-05 DIAGNOSIS — R278 Other lack of coordination: Secondary | ICD-10-CM | POA: Diagnosis not present

## 2021-04-05 DIAGNOSIS — M25561 Pain in right knee: Secondary | ICD-10-CM | POA: Diagnosis not present

## 2021-04-05 DIAGNOSIS — R269 Unspecified abnormalities of gait and mobility: Secondary | ICD-10-CM | POA: Diagnosis not present

## 2021-04-05 NOTE — Therapy (Signed)
[place] [address] [place], [place], [address] Phone: [phone]   Fax:  [phone]  Physical Therapy Treatment  Patient Details  Name: Shannon Hancock MRN: [id number] Date of Birth: 23-Oct-1950 Referring Provider (PT): [name] (Sports medicine @ [facility])   Encounter Date: 04/05/2021   PT End of Session - 04/05/21 1203     Visit Number 2    Number of Visits 17    Date for PT Re-Evaluation 06/01/21    Authorization Type Auth through 12/2    Authorization - Visit Number 2    Authorization - Number of Visits 10    PT Start Time 1100    PT Stop Time 1150    PT Time Calculation (min) 50 min    Equipment Utilized During Treatment Gait belt    Activity Tolerance Patient tolerated treatment well;Patient limited by pain;Patient limited by lethargy    Behavior During Therapy [facility] for tasks assessed/performed             Past Medical History:  Diagnosis Date   Allergy    Anxiety    Breast cancer ([place]) 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; ER/PR+, Her2-, 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%   Depression    Diabetes mellitus without complication (HCC)    Diverticulosis    Fibromyalgia    GERD (gastroesophageal reflux disease)    Heart murmur    "no murmur" documented in PCP note by [name], MD 02/09/21   High cholesterol    Hypertension    Joint pain     Past Surgical History:  Procedure Laterality Date   BREAST LUMPECTOMY Right 2016   BREAST LUMPECTOMY WITH RADIOACTIVE SEED AND SENTINEL LYMPH NODE BIOPSY Right 02/13/2015   Procedure: RIGHT BREAST LUMPECTOMY WITH RADIOACTIVE SEED AND SENTINEL LYMPH NODE MAPPING;  Surgeon: [name], MD;  Location: [facility];  Service: General;  Laterality: Right;   FOOT SURGERY Left    TOTAL KNEE ARTHROPLASTY Right 03/26/2021   Procedure: TOTAL KNEE ARTHROPLASTY;  Surgeon: [name], MD;  [facility];  Service: Orthopedics;  Laterality: Right;    TUBAL LIGATION      There were no vitals filed for this visit.   Subjective Assessment - 04/05/21 1115     Subjective Pt reports continued pain in her R Knee. No significant changes since previous session.    Pertinent History Shannon Hancock is a 70 y.o. female presenting to physical therapy s/p R TKA 03/26/21. Pt reports to therapy amb with RW. Pt resides in a two story home with six steps to enter from the front and a ramp in the back. She reports negotiating stairs with assistance from her husband. She reports being able to negotiate and live on the main story of her home. Pt reports she would like to increase her R knee mobility, strength, and decrease her pain in order to walk without AD and go bowling. PMH includes breast cancer, fibromyalgia, DM, HTN, anxiety, depression, and hearing loss. Pt denies any unexplained weight fluctuation, saddle paresthesia, unrelenting night pain, or falls in the last 6 months.    Limitations Lifting;Walking;Standing;House hold activities    How long can you sit comfortably? 5-10 min    How long can you stand comfortably? 5 min    How long can you walk comfortably? 5-8 minutes    Patient Stated Goals Returned and bowling and restore ROM    Pain Score 7  Pain Location Knee    Pain Orientation Right    Pain Descriptors / Indicators Aching;Sharp    Pain Type Surgical pain    Pain Onset 1 to 4 weeks ago    Pain Frequency Constant    Aggravating Factors  movement, standing    Pain Relieving Factors medication               Treatment provided this session  Therex:   Seated heel slides with 5-second hold x10 repetitions  Seated long arc quad with 5-second hold at end range x10 repetitions and seated position  Quad sets in supine with towel and pillow under right lower extremity ankle tactile cues and verbal cues in order to allow for proper muscle activation.   -2 sets of 5 repetitions -Patient reports increase in knee pain with prolonged  positioning with pillow under lower extremity.  Weight shifts 2 sets of 10 repetitions with upper extremities holding standard walker.  Cues for patient to improve knee extension as patient tended to keep lower extremity and severe knee flexion.  Following cues patient able to stand with increased knee extension and improve posture.     Self-care:  Patient educated thoroughly and abilities to elevate leg throughout the day in order to improve knee swelling.  Patient also educated to perform ankle pumps as her leg is elevated to further improve the swelling. Patient educated thoroughly regarding use of ice postoperatively for total knee arthroplasty as this will also help with her swelling and can prevent some of the pressure she is feeling overnight. Patient educated thoroughly regarding keeping knee straight throughout day to prevent knee flexion contracture and to allow for best outcome following surgery for knee range of motion. Patient educated in importance of home exercise program and improving knee outcome following her particular surgery.  Patient verbalized understanding and stated she would begin home exercise program.       Manual Therapy:  Physical therapist performed passive knee flexion and extension to endrange with 30 second holds at end range to improve passive and active knee flexion extension range of motion.  Knee flexion and extension range of motion measured flexion range of motion measured at 90 degrees in extension range of motion measured at 30 degrees from full extension on the right side  Majority of session was spent reviewing home exercise program and reviewing importance of completing home exercise program for proper recovery from the surgery.  Pt educated throughout session about proper posture and technique with exercises. Improved exercise technique, movement at target joints, use of target muscles after min to mod verbal, visual, tactile  cues.                           PT Education - 04/05/21 1118     Education Details exercise form and technique.Importance of frequent position changes, ankle pumps, elevation and ice in recovery from TKA in early phases.    Person(s) Educated Patient    Methods Explanation;Demonstration    Comprehension Verbalized understanding;Returned demonstration              PT Short Term Goals - 04/02/21 1332       PT SHORT TERM GOAL #1   Title Pt will demonstrate independence with HEP to improve R LE function for increased ability to participate with ADLs    Baseline HEP given    Time 4    Period Weeks    Status New  Target Date 04/30/21               PT Long Term Goals - 04/02/21 1333       PT LONG TERM GOAL #1   Title Patient will increase FOTO score to 49 to demonstrate predicted increase in functional mobility to complete ADLs    Baseline 04/02/21: 1    Time 12    Period Weeks    Status New    Target Date 06/25/21      PT LONG TERM GOAL #2   Title Pt will perform 5XSTS in 12 seconds in order to demonstrate improved LE strength and decrease the likelihood of falling.    Baseline 04/02/21: 25    Time 12    Period Weeks    Status New    Target Date 06/25/21      PT LONG TERM GOAL #3   Title Pt will actively demonstrate 0 deg R knee extension and 120 deg R knee flexion in order to safely negotiate steps and normalize gait without AD.    Baseline 04/02/21 R knee: flex 80 deg, ext: 50    Time 12    Period Weeks    Status New    Target Date 06/25/21      PT LONG TERM GOAL #4   Title Pt will demonstrate SLS of RLE of at least 10sec to demonstrate decreased fall risk and PLOF of contralateral LE    Baseline 04/02/21 LLE 10sec RLE unable    Time 8    Period Weeks    Status New                   Plan - 04/05/21 1257     Clinical Impression Statement Pt responded fairly to physical therapy treatment session. Pt continues to report  knee pain and reports some increase in knee pain over the past several days. Pt educated in and reviewed HEP as pt reports she has not been completing them as regularly as she should. Pt reported increase in pain with prolonged knee extension in supine position and also also continues to demonstrate significantly impaired knee extension range of motion.  Patient will benefit from continued skilled physical therapy intervention to improve her right lower extremity strength, ambulatory capacity, right knee range of motion, in order to ensure adequate recovery from total or total knee arthroplasty.    Personal Factors and Comorbidities Age;Comorbidity 2    Comorbidities DM, anxiety, depression    Examination-Activity Limitations Bed Mobility;Carry;Lift;Locomotion Level;Squat;Sit    Examination-Participation Restrictions Church;Cleaning;Community Activity;Laundry;Driving    Stability/Clinical Decision Making Evolving/Moderate complexity    Clinical Decision Making Moderate    Rehab Potential Good    PT Frequency 2x / week    PT Duration 12 weeks    PT Treatment/Interventions ADLs/Self Care Home Management;Gait training;Stair training;Functional mobility training;Therapeutic activities;Therapeutic exercise;Balance training;Patient/family education;Passive range of motion;Cryotherapy;Aquatic Therapy;Neuromuscular re-education;Manual techniques;Dry needling;Energy conservation;Scar mobilization;Electrical Stimulation    PT Next Visit Plan stationary bike; girth measurements    PT Home Exercise Plan Heel elevation, seated heel slides             Patient will benefit from skilled therapeutic intervention in order to improve the following deficits and impairments:  Abnormal gait, Decreased activity tolerance, Decreased balance, Decreased endurance, Decreased knowledge of use of DME, Decreased mobility, Decreased strength, Decreased range of motion, Hypomobility, Increased muscle spasms, Impaired  flexibility, Decreased skin integrity, Difficulty walking, Improper body mechanics, Pain  Visit Diagnosis: Total knee replacement status,  right  Muscle weakness (generalized)  Other abnormalities of gait and mobility     Problem List Patient Active Problem List   Diagnosis Date Noted   OA (osteoarthritis) of knee 03/26/2021   Primary osteoarthritis of right knee 03/26/2021   Leukocytosis 04/19/2020   Thyroid cyst 02/26/2018   Plantar fasciitis 02/19/2016   Family history of breast cancer in sister 03/10/2015   Breast cancer of lower-outer quadrant of right female breast ([place]) 02/10/2015   Abdominal bloating 12/16/2013   Edema 11/09/2013   Urge incontinence 05/05/2013   Atrophic vaginitis 05/05/2013   Syncope 02/15/2013   Irritable bowel syndrome 08/20/2012   Hearing loss 11/07/2011   Generalized anxiety disorder 11/07/2011   Peripheral neuropathy 07/05/2011   Osteoporosis 07/05/2011   Tinnitus, bilateral 04/05/2011   HYPERCHOLESTEROLEMIA 04/05/2010   ANEMIA, IRON DEFICIENCY 04/03/2010   DEPRESSION, MAJOR, MODERATE 04/03/2010   Essential hypertension 04/03/2010   ALLERGIC RHINITIS 04/03/2010   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/03/2010   POSTMENOPAUSAL SYNDROME 04/03/2010   DEGENERATIVE JOINT DISEASE, CERVICAL SPINE 04/03/2010   FIBROMYALGIA 04/03/2010    Particia Lather, PT 04/05/2021, 2:04 PM  [place] [facility] [address] [place], [place], [address] Phone: [phone]   Fax:  [phone]  Name: Shannon Hancock MRN: [id number] Date of Birth: 1950/09/07

## 2021-04-10 ENCOUNTER — Other Ambulatory Visit: Payer: Self-pay

## 2021-04-10 ENCOUNTER — Ambulatory Visit: Payer: Medicare PPO | Admitting: Physical Therapy

## 2021-04-10 ENCOUNTER — Encounter: Payer: Medicare PPO | Admitting: Physical Therapy

## 2021-04-10 DIAGNOSIS — R2689 Other abnormalities of gait and mobility: Secondary | ICD-10-CM | POA: Diagnosis not present

## 2021-04-10 DIAGNOSIS — R262 Difficulty in walking, not elsewhere classified: Secondary | ICD-10-CM | POA: Diagnosis not present

## 2021-04-10 DIAGNOSIS — G8929 Other chronic pain: Secondary | ICD-10-CM

## 2021-04-10 DIAGNOSIS — Z96651 Presence of right artificial knee joint: Secondary | ICD-10-CM | POA: Diagnosis not present

## 2021-04-10 DIAGNOSIS — R278 Other lack of coordination: Secondary | ICD-10-CM

## 2021-04-10 DIAGNOSIS — M6281 Muscle weakness (generalized): Secondary | ICD-10-CM | POA: Diagnosis not present

## 2021-04-10 DIAGNOSIS — R269 Unspecified abnormalities of gait and mobility: Secondary | ICD-10-CM | POA: Diagnosis not present

## 2021-04-10 DIAGNOSIS — R2681 Unsteadiness on feet: Secondary | ICD-10-CM | POA: Diagnosis not present

## 2021-04-10 DIAGNOSIS — M25561 Pain in right knee: Secondary | ICD-10-CM | POA: Diagnosis not present

## 2021-04-10 NOTE — Discharge Summary (Signed)
Physician Discharge Summary   Patient ID: Shannon Hancock MRN: [id number] DOB/AGE: 1950-07-11 70 y.o.  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   Weight as of this encounter: 80.3 kg.  Procedure:  Procedure(s) (LRB): TOTAL KNEE ARTHROPLASTY (Right)   Consults: None  HPI: Shannon Hancock is a 70 y.o. year old female with end stage OA of her right knee with progressively worsening pain and dysfunction. She has constant pain, with activity and at rest and significant functional deficits with difficulties even with ADLs. She has had extensive non-op management including analgesics, injections of cortisone and viscosupplements, and home exercise program, but remains in significant pain with significant dysfunction.Radiographs show bone on bone arthritis lateral and patellofemoral. She presents now for right Total Knee Arthroplasty.  Laboratory Data: Admission on 03/26/2021, Discharged on 03/28/2021  Component Date Value Ref Range Status   Hgb A1c MFr Bld 03/26/2021 6.3 (A) 4.8 - 5.6 % Final   Comment: (NOTE) Pre diabetes:          5.7%-6.4%  Diabetes:              >6.4%  Glycemic control for   <7.0% adults with diabetes    Mean Plasma Glucose  03/26/2021 134.11  mg/dL Final   Performed at [facility] [address], [place], [place] [address]   ABO/RH(D) 03/26/2021 B POS   Final   Antibody Screen 03/26/2021 NEG   Final   Sample Expiration 03/26/2021    Final                   Value:03/29/2021,2359 Performed at [facility], [place] [address], [place], [place] [address]    WBC 03/26/2021 9.5  4.0 - 10.5 K/uL Final   RBC 03/26/2021 4.70  3.87 - 5.11 MIL/uL Final   Hemoglobin 03/26/2021 12.3  12.0 - 15.0 g/dL Final   HCT 03/26/2021 40.2  36.0 - 46.0 % Final   MCV 03/26/2021 85.5  80.0 - 100.0 fL Final   MCH 03/26/2021 26.2  26.0 - 34.0 pg Final   MCHC 03/26/2021 30.6  30.0 - 36.0 g/dL Final   RDW 03/26/2021 13.8  11.5 - 15.5 % Final   Platelets 03/26/2021 300  150 - 400 K/uL Final   nRBC 03/26/2021 0.0  0.0 - 0.2 % Final   Performed at [facility], [place] [address] [place], [place] [address]   Sodium 03/26/2021 133 (A) 135 - 145 mmol/L Final   Potassium 03/26/2021 4.4  3.5 - 5.1 mmol/L Final   Chloride 03/26/2021 97 (A) 98 - 111 mmol/L Final   CO2 03/26/2021 24  22 -  32 mmol/L Final   Glucose, Bld 03/26/2021 88  70 - 99 mg/dL Final   Glucose reference range applies only to samples taken after fasting for at least 8 hours.   BUN 03/26/2021 12  8 - 23 mg/dL Final   Creatinine, Ser 03/26/2021 0.68  0.44 - 1.00 mg/dL Final   Calcium 03/26/2021 9.4  8.9 - 10.3 mg/dL Final   GFR, Estimated 03/26/2021 >60  >60 mL/min Final   Comment: (NOTE) Calculated using the CKD-EPI Creatinine Equation (2021)    Anion gap 03/26/2021 12  5 - 15 Final   Performed at [facility], [place] [address] [place], [place] [address]   Prothrombin Time 03/26/2021 12.6  11.4 - 15.2 seconds Final   INR 03/26/2021 0.9  0.8 - 1.2 Final   Comment: (NOTE) INR goal varies based on device and disease states. Performed at [facility], [place] [address] [place], [place] [address]     Glucose-Capillary 03/26/2021 97  70 - 99 mg/dL Final   Glucose reference range applies only to samples taken after fasting for at least 8 hours.   Comment 1 03/26/2021 Notify RN   Final   Comment 2 03/26/2021 Document in Chart   Final   ABO/RH(D) 03/26/2021    Final                   Value:B POS Performed at [facility], [place] [address], [place], [place] [address]    Glucose-Capillary 03/26/2021 101 (A) 70 - 99 mg/dL Final   Glucose reference range applies only to samples taken after fasting for at least 8 hours.   WBC 03/27/2021 15.3 (A) 4.0 - 10.5 K/uL Final   RBC 03/27/2021 4.07  3.87 - 5.11 MIL/uL Final   Hemoglobin 03/27/2021 10.8 (A) 12.0 - 15.0 g/dL Final   HCT 03/27/2021 32.6 (A) 36.0 - 46.0 % Final   MCV 03/27/2021 80.1  80.0 - 100.0 fL Final   MCH 03/27/2021 26.5  26.0 - 34.0 pg Final   MCHC 03/27/2021 33.1  30.0 - 36.0 g/dL Final   RDW 03/27/2021 13.7  11.5 - 15.5 % Final   Platelets 03/27/2021 278  150 - 400 K/uL Final   nRBC 03/27/2021 0.0  0.0 - 0.2 % Final   Performed at [facility], [place] [address] [place], [place] [address]   Sodium 03/27/2021 137  135 - 145 mmol/L Final   Potassium 03/27/2021 4.1  3.5 - 5.1 mmol/L Final   Chloride 03/27/2021 101  98 - 111 mmol/L Final   CO2 03/27/2021 21 (A) 22 - 32 mmol/L Final   Glucose, Bld 03/27/2021 114 (A) 70 - 99 mg/dL Final   Glucose reference range applies only to samples taken after fasting for at least 8 hours.   BUN 03/27/2021 10  8 - 23 mg/dL Final   Creatinine, Ser 03/27/2021 0.82  0.44 - 1.00 mg/dL Final   Calcium 03/27/2021 8.9  8.9 - 10.3 mg/dL Final   GFR, Estimated 03/27/2021 >60  >60 mL/min Final   Comment: (NOTE) Calculated using the CKD-EPI Creatinine Equation (2021)    Anion gap 03/27/2021 15  5 - 15 Final   Performed at [facility], [place] [address] [place], [place] [address]   WBC 03/28/2021 14.1 (A) 4.0 - 10.5 K/uL Final   RBC 03/28/2021 3.57 (A)  3.87 - 5.11 MIL/uL Final   Hemoglobin 03/28/2021 9.4 (A) 12.0 - 15.0 g/dL Final   HCT  03/28/2021 28.3 (A) 36.0 - 46.0 % Final   MCV 03/28/2021 79.3 (A) 80.0 - 100.0 fL Final   MCH 03/28/2021 26.3  26.0 - 34.0 pg Final   MCHC 03/28/2021 33.2  30.0 - 36.0 g/dL Final   RDW 03/28/2021 13.8  11.5 - 15.5 % Final   Platelets 03/28/2021 271  150 - 400 K/uL Final   nRBC 03/28/2021 0.0  0.0 - 0.2 % Final   Performed at [facility], [place] [address] [place], [place] [address]   Sodium 03/28/2021 132 (A) 135 - 145 mmol/L Final   Potassium 03/28/2021 3.0 (A) 3.5 - 5.1 mmol/L Final   [place] CHECK NOTED   Chloride 03/28/2021 97 (A) 98 - 111 mmol/L Final   CO2 03/28/2021 26  22 - 32 mmol/L Final   Glucose, Bld 03/28/2021 117 (A) 70 - 99 mg/dL Final   Glucose reference range applies only to samples taken after fasting for at least 8 hours.   BUN 03/28/2021 10  8 - 23 mg/dL Final   Creatinine, Ser 03/28/2021 0.59  0.44 - 1.00 mg/dL Final   Calcium 03/28/2021 8.3 (A) 8.9 - 10.3 mg/dL Final   GFR, Estimated 03/28/2021 >60  >60 mL/min Final   Comment: (NOTE) Calculated using the CKD-EPI Creatinine Equation (2021)    Anion gap 03/28/2021 9  5 - 15 Final   Performed at [facility], [place] [address], [place], [place] [address]  Orders Only on 03/21/2021  Component Date Value Ref Range Status   SARS Coronavirus 2 03/21/2021 RESULT: NEGATIVE   Final   Comment: RESULT: NEGATIVESARS-CoV-2 INTERPRETATION:A NEGATIVE  test result means that SARS-CoV-2 RNA was not present in the specimen above the limit of detection of this test. This does not preclude a possible SARS-CoV-2 infection and should not be used as the  sole basis for patient management decisions. Negative results must be combined with clinical observations, patient history, and epidemiological information. Optimum specimen types and timing for peak viral levels during infections caused by SARS-CoV-2  have not been  determined. Collection of multiple specimens or types of specimens may be necessary to detect virus. Improper specimen collection and handling, sequence variability under primers/probes, or organism present below the limit of detection may  lead to false negative results. Positive and negative predictive values of testing are highly dependent on prevalence. False negative test results are more likely when prevalence of disease is high.The expected result is NEGATIVE.Fact S                          heet for  Healthcare Providers: LocalChronicle.no Sheet for Patients: SalonLookup.es Reference Range - Negative   Hospital Outpatient Visit on 03/12/2021  Component Date Value Ref Range Status   MRSA, PCR 03/12/2021 NEGATIVE  NEGATIVE Final   Staphylococcus aureus 03/12/2021 NEGATIVE  NEGATIVE Final   Comment: (NOTE) The Xpert SA Assay (FDA approved for NASAL specimens in patients 74 years of age and older), is one component of a comprehensive surveillance program. It is not intended to diagnose infection nor to guide or monitor treatment. Performed at [facility], [place] [address], [place], [place] [address]    Glucose-Capillary 03/12/2021 108 (A) 70 - 99 mg/dL Final   Glucose reference range applies only to samples taken after fasting for at least 8 hours.     X-Rays:No results found.  EKG: Orders placed or performed during the hospital encounter of 03/12/21   EKG  12 lead per protocol   EKG 12 lead per protocol     Hospital Course: Shannon Hancock is a 70 y.o. who was admitted to [facility]. They were brought to the operating room on 03/26/2021 and underwent Procedure(s): TOTAL KNEE ARTHROPLASTY.  Patient tolerated the procedure well and was later transferred to the recovery room and then to the orthopaedic floor for postoperative care. They were given PO and IV analgesics for pain control following  their surgery. They were given 24 hours of postoperative antibiotics of  Anti-infectives (From admission, onward)    Start     Dose/Rate Route Frequency Ordered Stop   03/26/21 1430  ceFAZolin (ANCEF) IVPB 2g/100 mL premix        2 g 200 mL/hr over 30 Minutes Intravenous Every 6 hours 03/26/21 1207 03/26/21 2025   03/26/21 0600  ceFAZolin (ANCEF) IVPB 2g/100 mL premix        2 g 200 mL/hr over 30 Minutes Intravenous On call to O.R. 03/26/21 3220 03/26/21 2542      and started on DVT prophylaxis in the form of Xarelto and TED hose.   PT and OT were ordered for total joint protocol. Discharge planning consulted to help with postop disposition and equipment needs.  Patient had an uneventful night on the evening of surgery. They started to get up OOB with therapy on POD#1. She worked with therapy on POD #1 and was not meeting goals.She worked with therapy on POD #2 and was meeting goals. Pt was discharged to home later that day in stable condition.  Diet: Regular diet Activity: WBAT Follow-up: in two weeks Disposition: Home Discharged Condition: good   Discharge Instructions     Call MD / Call 911   Complete by: As directed    If you experience chest pain or shortness of breath, CALL 911 and be transported to the hospital emergency room.  If you develope a fever above 101 F, pus (white drainage) or increased drainage or redness at the wound, or calf pain, call your surgeon's office.   Call MD / Call 911   Complete by: As directed    If you experience chest pain or shortness of breath, CALL 911 and be transported to the hospital emergency room.  If you develope a fever above 101 F, pus (white drainage) or increased drainage or redness at the wound, or calf pain, call your surgeon's office.   Change dressing   Complete by: As directed    You may remove the bulky bandage (ACE wrap and gauze) two days after surgery. You will have an adhesive waterproof bandage underneath. Leave this in place  until your first follow-up appointment.   Change dressing   Complete by: As directed    You may remove the bulky bandage (ACE wrap and gauze) two days after surgery. You will have an adhesive waterproof bandage underneath. Leave this in place until your first follow-up appointment.   Constipation Prevention   Complete by: As directed    Drink plenty of fluids.  Prune juice may be helpful.  You may use a stool softener, such as Colace (over the counter) 100 mg twice a day.  Use MiraLax (over the counter) for constipation as needed.   Constipation Prevention   Complete by: As directed    Drink plenty of fluids.  Prune juice may be helpful.  You may use a stool softener, such as Colace (over the counter) 100 mg twice a day.  Use MiraLax (  over the counter) for constipation as needed.   Diet - low sodium heart healthy   Complete by: As directed    Diet - low sodium heart healthy   Complete by: As directed    Do not put a pillow under the knee. Place it under the heel.   Complete by: As directed    Do not put a pillow under the knee. Place it under the heel.   Complete by: As directed    Driving restrictions   Complete by: As directed    No driving for two weeks   Driving restrictions   Complete by: As directed    No driving for two weeks   Post-operative opioid taper instructions:   Complete by: As directed    POST-OPERATIVE OPIOID TAPER INSTRUCTIONS: It is important to wean off of your opioid medication as soon as possible. If you do not need pain medication after your surgery it is ok to stop day one. Opioids include: Codeine, Hydrocodone(Norco, Vicodin), Oxycodone(Percocet, oxycontin) and hydromorphone amongst others.  Long term and even short term use of opiods can cause: Increased pain response Dependence Constipation Depression Respiratory depression And more.  Withdrawal symptoms can include Flu like symptoms Nausea, vomiting And more Techniques to manage these  symptoms Hydrate well Eat regular healthy meals Stay active Use relaxation techniques(deep breathing, meditating, yoga) Do Not substitute Alcohol to help with tapering If you have been on opioids for less than two weeks and do not have pain than it is ok to stop all together.  Plan to wean off of opioids This plan should start within one week post op of your joint replacement. Maintain the same interval or time between taking each dose and first decrease the dose.  Cut the total daily intake of opioids by one tablet each day Next start to increase the time between doses. The last dose that should be eliminated is the evening dose.      Post-operative opioid taper instructions:   Complete by: As directed    POST-OPERATIVE OPIOID TAPER INSTRUCTIONS: It is important to wean off of your opioid medication as soon as possible. If you do not need pain medication after your surgery it is ok to stop day one. Opioids include: Codeine, Hydrocodone(Norco, Vicodin), Oxycodone(Percocet, oxycontin) and hydromorphone amongst others.  Long term and even short term use of opiods can cause: Increased pain response Dependence Constipation Depression Respiratory depression And more.  Withdrawal symptoms can include Flu like symptoms Nausea, vomiting And more Techniques to manage these symptoms Hydrate well Eat regular healthy meals Stay active Use relaxation techniques(deep breathing, meditating, yoga) Do Not substitute Alcohol to help with tapering If you have been on opioids for less than two weeks and do not have pain than it is ok to stop all together.  Plan to wean off of opioids This plan should start within one week post op of your joint replacement. Maintain the same interval or time between taking each dose and first decrease the dose.  Cut the total daily intake of opioids by one tablet each day Next start to increase the time between doses. The last dose that should be eliminated is  the evening dose.      TED hose   Complete by: As directed    Use stockings (TED hose) for three weeks on both leg(s).  You may remove them at night for sleeping.   TED hose   Complete by: As directed    Use stockings (TED hose)  for three weeks on both leg(s).  You may remove them at night for sleeping.   Weight bearing as tolerated   Complete by: As directed    Weight bearing as tolerated   Complete by: As directed       Allergies as of 03/28/2021       Reactions   Olive Oil Nausea And Vomiting   Severe  Nausea / Vomiting.   Oxycodone Other (See Comments)   Patient reports itching        Medication List     STOP taking these medications    calamine lotion   celecoxib 100 MG capsule Commonly known as: CeleBREX   Vitamin D 50 MCG (2000 UT) Caps       TAKE these medications    alendronate 70 MG tablet Commonly known as: FOSAMAX TAKE 1 TABLET BY MOUTH ONCE A WEEK. TAKE WITH A FULL GLASS OF WATER ON AN EMPTY STOMACH.   ALPRAZolam 1 MG tablet Commonly known as: XANAX TAKE 1 TABLET BY MOUTH 4 TIMES A DAY AS NEEDED FOR SLEEP OR ANXIETY What changed: See the new instructions.   amLODipine 5 MG tablet Commonly known as: NORVASC Take 5 mg by mouth daily.   budesonide-formoterol 80-4.5 MCG/ACT inhaler Commonly known as: SYMBICORT Inhale 2 puffs into the lungs daily as needed (Wheezing and Asthma).   diphenhydrAMINE 25 MG tablet Commonly known as: BENADRYL Take 1 tablet (25 mg total) by mouth every 8 (eight) hours as needed.   DRY EYES OP Place 1 drop into both eyes daily as needed (Dry eye).   famotidine 20 MG tablet Commonly known as: PEPCID Take 20 mg by mouth at bedtime.   fexofenadine 180 MG tablet Commonly known as: ALLEGRA Take 180 mg by mouth daily.   fexofenadine-pseudoephedrine 60-120 MG 12 hr tablet Commonly known as: ALLEGRA-D Take 1 tablet by mouth 2 (two) times daily.   fluticasone 50 MCG/ACT nasal spray Commonly known as:  FLONASE Place 2 sprays into both nostrils daily as needed for allergies or rhinitis.   HYDROmorphone 2 MG tablet Commonly known as: DILAUDID Take 1-2 tablets (2-4 mg total) by mouth every 6 (six) hours as needed for severe pain.   losartan-hydrochlorothiazide 100-12.5 MG tablet Commonly known as: HYZAAR TAKE 1 TABLET BY MOUTH DAILY.   Lyrica 100 MG capsule Generic drug: pregabalin TAKE ONE CAPSULE TWICE A DAY   Metamucil 0.52 g capsule Generic drug: psyllium Take 1 capsule (0.52 g total) by mouth daily. What changed: how much to take   methocarbamol 500 MG tablet Commonly known as: ROBAXIN Take 1 tablet (500 mg total) by mouth every 6 (six) hours as needed for muscle spasms. What changed: when to take this   metoprolol succinate 25 MG 24 hr tablet Commonly known as: TOPROL-XL Take 25 mg by mouth daily as needed (High blood pressure).   omeprazole 40 MG capsule Commonly known as: PRILOSEC Take 40 mg by mouth daily.   PROBIOTIC ACIDOPHILUS PO Take 1 capsule by mouth daily.   rivaroxaban 10 MG Tabs tablet Commonly known as: XARELTO Take 1 tablet (10 mg total) by mouth daily with breakfast for 20 days. Then take one 81 mg aspirin once a day for three weeks. Then discontinue aspirin.   rosuvastatin 5 MG tablet Commonly known as: CRESTOR Take 5 mg by mouth 2 (two) times a week.   solifenacin 5 MG tablet Commonly known as: VESICARE Take 5 mg by mouth daily.   traMADol 50 MG tablet Commonly known  as: ULTRAM Take 1 tablet (50 mg total) by mouth 3 (three) times daily as needed. What changed: when to take this   Zetia 10 MG tablet Generic drug: ezetimibe TAKE 1 TABLET BY MOUTH DAILY.               Discharge Care Instructions  (From admission, onward)           Start     Ordered   03/28/21 0000  Weight bearing as tolerated        03/28/21 0801   03/28/21 0000  Change dressing       Comments: You may remove the bulky bandage (ACE wrap and gauze) two days  after surgery. You will have an adhesive waterproof bandage underneath. Leave this in place until your first follow-up appointment.   03/28/21 0801   03/27/21 0000  Weight bearing as tolerated        03/27/21 0742   03/27/21 0000  Change dressing       Comments: You may remove the bulky bandage (ACE wrap and gauze) two days after surgery. You will have an adhesive waterproof bandage underneath. Leave this in place until your first follow-up appointment.   03/27/21 5248            Follow-up Information     [name], MD. Go on 04/10/2021.   Specialty: Orthopedic Surgery Why: You are scheduled for first post op appointment on Tuesday October 11th at 1:15pm. Contact information: [address] [place] [address] [place] [address] [phone]                 Signed: Fenton Foy, MBA, PA-C Orthopedic Surgery 04/10/2021, 10:18 AM

## 2021-04-10 NOTE — Therapy (Signed)
[facility] MAIN REHAB SERVICES [address] [place], [place], [address] Phone: [phone]   Fax:  [phone]  Physical Therapy Treatment  Patient Details  Name: Shannon Hancock MRN: [id number] Date of Birth: 10/02/1950 Referring Provider (PT): Adam Kendall MD (Sports medicine @ [facility])   Encounter Date: 04/10/2021   PT End of Session - 04/10/21 1125     Visit Number 3    Number of Visits 17    Date for PT Re-Evaluation 06/01/21    Authorization Type Auth through 12/2    Authorization - Visit Number 2    Authorization - Number of Visits 10    PT Start Time 1112    PT Stop Time 1150    PT Time Calculation (min) 38 min    Equipment Utilized During Treatment Gait belt    Activity Tolerance Patient tolerated treatment well;Patient limited by pain;Patient limited by lethargy    Behavior During Therapy WFL for tasks assessed/performed             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%   Depression    Diabetes mellitus without complication (HCC)    Diverticulosis    Fibromyalgia    GERD (gastroesophageal reflux disease)    Heart murmur    "no murmur" documented in PCP note by Cynthia White, MD 02/09/21   High cholesterol    Hypertension    Joint pain     Past Surgical History:  Procedure Laterality Date   BREAST LUMPECTOMY Right 2016   BREAST LUMPECTOMY WITH RADIOACTIVE SEED AND SENTINEL LYMPH NODE BIOPSY Right 02/13/2015   Procedure: RIGHT BREAST LUMPECTOMY WITH RADIOACTIVE SEED AND SENTINEL LYMPH NODE MAPPING;  Surgeon: Paul Toth III, MD;  Location: [facility];  Service: General;  Laterality: Right;   FOOT SURGERY Left    TOTAL KNEE ARTHROPLASTY Right 03/26/2021   Procedure: TOTAL KNEE ARTHROPLASTY;  Surgeon: Aluisio, Frank, MD;  Location: [facility];  Service: Orthopedics;  Laterality: Right;    TUBAL LIGATION      There were no vitals filed for this visit.   Subjective Assessment - 04/10/21 1121     Subjective Pt reports improvement in knee s/s since previous visit. She does state increase in knee pain following last session which is to be expecteddue to time since surgical procedure.    Pertinent History [name] is a 70 y.o. female presenting to physical therapy s/p R TKA 03/26/21. Pt reports to therapy amb with RW. Pt resides in a two story home with six steps to enter from the front and a ramp in the back. She reports negotiating stairs with assistance from her husband. She reports being able to negotiate and live on the main story of her home. Pt reports she would like to increase her R knee mobility, strength, and decrease her pain in order to walk without AD and go bowling. PMH includes breast cancer, fibromyalgia, DM, HTN, anxiety, depression, and hearing loss. Pt denies any unexplained weight fluctuation, saddle paresthesia, unrelenting night pain, or falls in the last 6 months.    Limitations Lifting;Walking;Standing;House hold activities    How long can you sit comfortably? 5-10 min    How long can you stand comfortably? 5 min    How long can you walk comfortably? 5-8 minutes    Patient Stated Goals Returned   and bowling and restore ROM    Pain Onset 1 to 4 weeks ago                 Treatment provided this session  Therex:  Heel slides seated  10 x 5 sec   HS stretch (seated)  2 x 30 sec -cues for maintenance of knee extension  Gastroc stretch (seated)  -2 x 30 sec -cues for maintenance of knee extension.   Quad sets 10 x 5 sec -VC and TC for proper muscle activation.    There Act:  Heel raises- sig UE support (Walker)  -x10 ea LE -cues to improve knee extension  Marching - sig UE support (Walker) X 10 ea LE, -sig UE support when transferring weight to involved LE (R)    Manual Therapy:  End range overpressure for extension 5 x 30  seconds - knee extension measured at 18 degrees from full extension (improved form last session) following quad sets and end range extension overpressure.    Pt required occasional rest breaks due fatigue, PT was quick to ask when pt appeared to be fatiguing in order to prevent excessive fatigue.    Pt educated throughout session about proper posture and technique with exercises. Improved exercise technique, movement at target joints, use of target muscles after min to mod verbal, visual, tactile cues.                        PT Education - 04/10/21 1123     Education Details Exs form and technique    Person(s) Educated Patient;Spouse    Methods Explanation;Demonstration    Comprehension Verbalized understanding;Returned demonstration;Verbal cues required;Tactile cues required              PT Short Term Goals - 04/02/21 1332       PT SHORT TERM GOAL #1   Title Pt will demonstrate independence with HEP to improve R LE function for increased ability to participate with ADLs    Baseline HEP given    Time 4    Period Weeks    Status New    Target Date 04/30/21               PT Long Term Goals - 04/02/21 1333       PT LONG TERM GOAL #1   Title Patient will increase FOTO score to 49 to demonstrate predicted increase in functional mobility to complete ADLs    Baseline 04/02/21: 1    Time 12    Period Weeks    Status New    Target Date 06/25/21      PT LONG TERM GOAL #2   Title Pt will perform 5XSTS in 12 seconds in order to demonstrate improved LE strength and decrease the likelihood of falling.    Baseline 04/02/21: 25    Time 12    Period Weeks    Status New    Target Date 06/25/21      PT LONG TERM GOAL #3   Title Pt will actively demonstrate 0 deg R knee extension and 120 deg R knee flexion in order to safely negotiate steps and normalize gait without AD.    Baseline 04/02/21 R knee: flex 80 deg, ext: 50    Time 12    Period Weeks     Status New    Target Date 06/25/21      PT LONG TERM GOAL #4   Title Pt will demonstrate SLS  of RLE of at least 10sec to demonstrate decreased fall risk and PLOF of contralateral LE    Baseline 04/02/21 LLE 10sec RLE unable    Time 8    Period Weeks    Status New                   Plan - 04/10/21 1125     Clinical Impression Statement Pt responded fairly to physical therapy treatment session. Pt continues to report knee pain and reports some increase in knee pain over the past several days. Pt reported increase in pain with prolonged knee extension in supine position and also also continues to demonstrate significantly impaired knee extension range of motion. Patient will benefit from continued skilled physical therapy intervention to improve her right lower extremity strength, ambulatory capacity, right knee range of motion, in order to ensure adequate recovery from total or total knee arthroplasty.    Personal Factors and Comorbidities Age;Comorbidity 2    Comorbidities DM, anxiety, depression    Examination-Activity Limitations Bed Mobility;Carry;Lift;Locomotion Level;Squat;Sit    Examination-Participation Restrictions Church;Cleaning;Community Activity;Laundry;Driving    Stability/Clinical Decision Making Evolving/Moderate complexity    Rehab Potential Good    PT Frequency 2x / week    PT Duration 12 weeks    PT Treatment/Interventions ADLs/Self Care Home Management;Gait training;Stair training;Functional mobility training;Therapeutic activities;Therapeutic exercise;Balance training;Patient/family education;Passive range of motion;Cryotherapy;Aquatic Therapy;Neuromuscular re-education;Manual techniques;Dry needling;Energy conservation;Scar mobilization;Electrical Stimulation    PT Next Visit Plan stationary bike; girth measurements    PT Home Exercise Plan Heel elevation, seated heel slides             Patient will benefit from skilled therapeutic intervention in order to  improve the following deficits and impairments:  Abnormal gait, Decreased activity tolerance, Decreased balance, Decreased endurance, Decreased knowledge of use of DME, Decreased mobility, Decreased strength, Decreased range of motion, Hypomobility, Increased muscle spasms, Impaired flexibility, Decreased skin integrity, Difficulty walking, Improper body mechanics, Pain  Visit Diagnosis: Total knee replacement status, right  Muscle weakness (generalized)  Other abnormalities of gait and mobility  Other lack of coordination  Chronic pain of right knee     Problem List Patient Active Problem List   Diagnosis Date Noted   OA (osteoarthritis) of knee 03/26/2021   Primary osteoarthritis of right knee 03/26/2021   Leukocytosis 04/19/2020   Thyroid cyst 02/26/2018   Plantar fasciitis 02/19/2016   Family history of breast cancer in sister 03/10/2015   Breast cancer of lower-outer quadrant of right female breast (HCC) 02/10/2015   Abdominal bloating 12/16/2013   Edema 11/09/2013   Urge incontinence 05/05/2013   Atrophic vaginitis 05/05/2013   Syncope 02/15/2013   Irritable bowel syndrome 08/20/2012   Hearing loss 11/07/2011   Generalized anxiety disorder 11/07/2011   Peripheral neuropathy 07/05/2011   Osteoporosis 07/05/2011   Tinnitus, bilateral 04/05/2011   HYPERCHOLESTEROLEMIA 04/05/2010   ANEMIA, IRON DEFICIENCY 04/03/2010   DEPRESSION, MAJOR, MODERATE 04/03/2010   Essential hypertension 04/03/2010   ALLERGIC RHINITIS 04/03/2010   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/03/2010   POSTMENOPAUSAL SYNDROME 04/03/2010   DEGENERATIVE JOINT DISEASE, CERVICAL SPINE 04/03/2010   FIBROMYALGIA 04/03/2010    Christopher B Byrd, PT 04/10/2021, 5:01 PM  [facility] MA[facility] RE[facility] SERVICES [address] [place], [place], [address] Phone: [phone]   Fax:  [phone]  Name: Patrisia B Rowland MRN: [id number] Date of Birth:  05/11/1951    

## 2021-04-12 ENCOUNTER — Encounter: Payer: Medicare PPO | Admitting: Physical Therapy

## 2021-04-12 ENCOUNTER — Ambulatory Visit: Payer: Medicare PPO | Admitting: Physical Therapy

## 2021-04-12 ENCOUNTER — Other Ambulatory Visit: Payer: Self-pay

## 2021-04-12 DIAGNOSIS — R269 Unspecified abnormalities of gait and mobility: Secondary | ICD-10-CM | POA: Diagnosis not present

## 2021-04-12 DIAGNOSIS — R262 Difficulty in walking, not elsewhere classified: Secondary | ICD-10-CM | POA: Diagnosis not present

## 2021-04-12 DIAGNOSIS — R2689 Other abnormalities of gait and mobility: Secondary | ICD-10-CM

## 2021-04-12 DIAGNOSIS — M25561 Pain in right knee: Secondary | ICD-10-CM | POA: Diagnosis not present

## 2021-04-12 DIAGNOSIS — R278 Other lack of coordination: Secondary | ICD-10-CM | POA: Diagnosis not present

## 2021-04-12 DIAGNOSIS — Z96651 Presence of right artificial knee joint: Secondary | ICD-10-CM

## 2021-04-12 DIAGNOSIS — M6281 Muscle weakness (generalized): Secondary | ICD-10-CM | POA: Diagnosis not present

## 2021-04-12 DIAGNOSIS — G8929 Other chronic pain: Secondary | ICD-10-CM | POA: Diagnosis not present

## 2021-04-12 DIAGNOSIS — R2681 Unsteadiness on feet: Secondary | ICD-10-CM | POA: Diagnosis not present

## 2021-04-12 NOTE — Therapy (Signed)
[place] MAIN [facility] SERVICES [address] [place], [place], [address] Phone: [phone]   Fax:  [phone]  Physical Therapy Treatment  Patient Details  Name: Shannon Hancock MRN: [id number] Date of Birth: [birth year] Referring Provider (PT): [name] (Sports medicine @ [facility] [place])   Encounter Date: 04/12/2021   PT End of Session - 04/12/21 1405     Visit Number 4    Number of Visits 17    Date for PT Re-Evaluation 06/01/21    Authorization Type Auth through 12/2    Authorization - Visit Number 2    Authorization - Number of Visits 10    PT Start Time 1102    PT Stop Time 1145    PT Time Calculation (min) 43 min    Equipment Utilized During Treatment Gait belt    Activity Tolerance Patient tolerated treatment well;Patient limited by pain;Patient limited by lethargy    Behavior During Therapy [facility] for tasks assessed/performed             Past Medical History:  Diagnosis Date   Allergy    Anxiety    Breast cancer ([place]) 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; ER/PR+, Her2-, 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%   Depression    Diabetes mellitus without complication (HCC)    Diverticulosis    Fibromyalgia    GERD (gastroesophageal reflux disease)    Heart murmur    "no murmur" documented in PCP note by [name], MD 02/09/21   High cholesterol    Hypertension    Joint pain     Past Surgical History:  Procedure Laterality Date   BREAST LUMPECTOMY Right 2016   BREAST LUMPECTOMY WITH RADIOACTIVE SEED AND SENTINEL LYMPH NODE BIOPSY Right 02/13/2015   Procedure: RIGHT BREAST LUMPECTOMY WITH RADIOACTIVE SEED AND SENTINEL LYMPH NODE MAPPING;  Surgeon: [name], MD;  Location: [place];  Service: General;  Laterality: Right;   FOOT SURGERY Left    TOTAL KNEE ARTHROPLASTY Right 03/26/2021   Procedure: TOTAL KNEE ARTHROPLASTY;  Surgeon: [name], MD;  Location[facility];  Service: Orthopedics;  Laterality: Right;    TUBAL LIGATION      There were no vitals filed for this visit.   Subjective Assessment - 04/12/21 1111     Subjective Pt reports follow up with MD went well, got post op bandage removed and wound has been looking well and healing well. DOes report minimal knee drainage last night but it has since resolved.    Pertinent History Shannon Hancock is a 70 y.o. female presenting to physical therapy s/p R TKA 03/26/21. Pt reports to therapy amb with RW. Pt resides in a two story home with six steps to enter from the front and a ramp in the back. She reports negotiating stairs with assistance from her husband. She reports being able to negotiate and live on the main story of her home. Pt reports she would like to increase her R knee mobility, strength, and decrease her pain in order to walk without AD and go bowling. PMH includes breast cancer, fibromyalgia, DM, HTN, anxiety, depression, and hearing loss. Pt denies any unexplained weight fluctuation, saddle paresthesia, unrelenting night pain, or falls in the last 6 months.    Limitations Lifting;Walking;Standing;House hold activities    How long can you sit comfortably? 5-10 min    How long can you stand comfortably? 5 min    How long can you walk comfortably? 5-8 minutes  Patient Stated Goals Returned and bowling and restore ROM    Currently in Pain? Yes    Pain Score 7     Pain Location Knee    Pain Orientation Right    Pain Descriptors / Indicators Aching    Pain Type Surgical pain    Pain Onset 1 to 4 weeks ago    Pain Frequency Constant    Aggravating Factors  moving, standing    Pain Relieving Factors medication    Effect of Pain on Daily Activities limited             Therex:  Nustep level 1 x 5 min  with seat level 8 for AAROM of right knee  Heel slides seated  10 x 5 sec  -cues for proper hold timed   HS stretch (seated)  2 x 30 sec -cues for maintenance of knee extension and proper hold times    Gastroc stretch  (seated)  -2 x 30 sec -cues for maintenance of knee extension.    Quad sets 10 x 5 sec -VC and TC for proper muscle activation.    Standing TKE YTB  -cues for knee muscle activation (pt attempts to utilize hip weight shift on first repetition)  SAQ: 10 x 5 se holds -cues for hold times and TC for target to achieve greater knee extension   The following activities were completed in parallel bars or at balance bar   Heel raises- sig UE support  -x20 ea LE -cues to improve knee extension   Marching - sig UE support  X 10 ea LE, -sig UE support when transferring weight to involved LE (R)       Manual Therapy:   End range overpressure for extension 5 x 30 seconds - knee extension measured at 19 degrees from full extension following quad sets and end range extension overpressure.  -gentle mobs at end range knee extension to improve pt tolerance with end range knee extension 2 x 1 minute      Pt required occasional rest breaks due fatigue, PT was quick to ask when pt appeared to be fatiguing in order to prevent excessive fatigue.   Pt educated throughout session about proper posture and technique with exercises. Improved exercise technique, movement at target joints, use of target muscles after min to mod verbal, visual, tactile cues.                             PT Education - 04/12/21 1125     Education Details Exs form and technique    Person(s) Educated Patient;Spouse    Methods Explanation;Demonstration    Comprehension Verbalized understanding;Returned demonstration;Verbal cues required;Tactile cues required;Need further instruction              PT Short Term Goals - 04/02/21 1332       PT SHORT TERM GOAL #1   Title Pt will demonstrate independence with HEP to improve R LE function for increased ability to participate with ADLs    Baseline HEP given    Time 4    Period Weeks    Status New    Target Date 04/30/21                PT Long Term Goals - 04/02/21 1333       PT LONG TERM GOAL #1   Title Patient will increase FOTO score to 49 to demonstrate predicted increase in functional mobility to  complete ADLs    Baseline 04/02/21: 1    Time 12    Period Weeks    Status New    Target Date 06/25/21      PT LONG TERM GOAL #2   Title Pt will perform 5XSTS in 12 seconds in order to demonstrate improved LE strength and decrease the likelihood of falling.    Baseline 04/02/21: 25    Time 12    Period Weeks    Status New    Target Date 06/25/21      PT LONG TERM GOAL #3   Title Pt will actively demonstrate 0 deg R knee extension and 120 deg R knee flexion in order to safely negotiate steps and normalize gait without AD.    Baseline 04/02/21 R knee: flex 80 deg, ext: 50    Time 12    Period Weeks    Status New    Target Date 06/25/21      PT LONG TERM GOAL #4   Title Pt will demonstrate SLS of RLE of at least 10sec to demonstrate decreased fall risk and PLOF of contralateral LE    Baseline 04/02/21 LLE 10sec RLE unable    Time 8    Period Weeks    Status New                   Plan - 04/12/21 1406     Clinical Impression Statement Patient demonstrates improved tolerance for therapeutic exercise in today's session.  She continues to demonstrate good motivation to complete therapeutic exercises during physical therapy sessions.  Continues to lack terminal knee extension but it is improving with each visit.  Following manual therapy and several exercises patient was measured at 19 degrees from full knee extension.  Patient also has significant difficulty with ambulating without assistive device and is recommended to continue using walker until further notice.  Patient will continue to benefit from skilled physical therapy intervention in order to improve her lower extremity strength, improve her knee range of motion, improve her ambulatory capacity, improve her overall health-related quality of life.     Personal Factors and Comorbidities Age;Comorbidity 2    Comorbidities DM, anxiety, depression    Examination-Activity Limitations Bed Mobility;Carry;Lift;Locomotion Level;Squat;Sit    Examination-Participation Restrictions Church;Cleaning;Community Activity;Laundry;Driving    Stability/Clinical Decision Making Evolving/Moderate complexity    Rehab Potential Good    PT Frequency 2x / week    PT Duration 12 weeks    PT Treatment/Interventions ADLs/Self Care Home Management;Gait training;Stair training;Functional mobility training;Therapeutic activities;Therapeutic exercise;Balance training;Patient/family education;Passive range of motion;Cryotherapy;Aquatic Therapy;Neuromuscular re-education;Manual techniques;Dry needling;Energy conservation;Scar mobilization;Electrical Stimulation    PT Next Visit Plan stationary bike; girth measurements    PT Home Exercise Plan Heel elevation, seated heel slides             Patient will benefit from skilled therapeutic intervention in order to improve the following deficits and impairments:  Abnormal gait, Decreased activity tolerance, Decreased balance, Decreased endurance, Decreased knowledge of use of DME, Decreased mobility, Decreased strength, Decreased range of motion, Hypomobility, Increased muscle spasms, Impaired flexibility, Decreased skin integrity, Difficulty walking, Improper body mechanics, Pain  Visit Diagnosis: Muscle weakness (generalized)  Other abnormalities of gait and mobility  Total knee replacement status, right  Chronic pain of right knee     Problem List Patient Active Problem List   Diagnosis Date Noted   OA (osteoarthritis) of knee 03/26/2021   Primary osteoarthritis of right knee 03/26/2021   Leukocytosis 04/19/2020   Thyroid  cyst 02/26/2018   Plantar fasciitis 02/19/2016   Family history of breast cancer in sister 03/10/2015   Breast cancer of lower-outer quadrant of right female breast ([place]) 02/10/2015    Abdominal bloating 12/16/2013   Edema 11/09/2013   Urge incontinence 05/05/2013   Atrophic vaginitis 05/05/2013   Syncope 02/15/2013   Irritable bowel syndrome 08/20/2012   Hearing loss 11/07/2011   Generalized anxiety disorder 11/07/2011   Peripheral neuropathy 07/05/2011   Osteoporosis 07/05/2011   Tinnitus, bilateral 04/05/2011   HYPERCHOLESTEROLEMIA 04/05/2010   ANEMIA, IRON DEFICIENCY 04/03/2010   DEPRESSION, MAJOR, MODERATE 04/03/2010   Essential hypertension 04/03/2010   ALLERGIC RHINITIS 04/03/2010   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/03/2010   POSTMENOPAUSAL SYNDROME 04/03/2010   DEGENERATIVE JOINT DISEASE, CERVICAL SPINE 04/03/2010   FIBROMYALGIA 04/03/2010    Shannon Hancock, PT 04/12/2021, 2:10 PM  [place] MAIN [facility] SERVICES [address] [place], [place], [address] Phone: [phone]   Fax:  [phone]  Name: Shannon Hancock MRN: [id number] Date of Birth: 06-01-1951

## 2021-04-16 ENCOUNTER — Encounter: Payer: Medicare PPO | Admitting: Occupational Therapy

## 2021-04-17 ENCOUNTER — Encounter: Payer: Self-pay | Admitting: Physical Therapy

## 2021-04-17 ENCOUNTER — Ambulatory Visit: Payer: Medicare PPO | Admitting: Physical Therapy

## 2021-04-17 ENCOUNTER — Other Ambulatory Visit: Payer: Self-pay

## 2021-04-17 ENCOUNTER — Encounter: Payer: Medicare PPO | Admitting: Physical Therapy

## 2021-04-17 DIAGNOSIS — R2681 Unsteadiness on feet: Secondary | ICD-10-CM | POA: Diagnosis not present

## 2021-04-17 DIAGNOSIS — R2689 Other abnormalities of gait and mobility: Secondary | ICD-10-CM | POA: Diagnosis not present

## 2021-04-17 DIAGNOSIS — G8929 Other chronic pain: Secondary | ICD-10-CM

## 2021-04-17 DIAGNOSIS — Z96651 Presence of right artificial knee joint: Secondary | ICD-10-CM

## 2021-04-17 DIAGNOSIS — M6281 Muscle weakness (generalized): Secondary | ICD-10-CM | POA: Diagnosis not present

## 2021-04-17 DIAGNOSIS — R269 Unspecified abnormalities of gait and mobility: Secondary | ICD-10-CM | POA: Diagnosis not present

## 2021-04-17 DIAGNOSIS — M25561 Pain in right knee: Secondary | ICD-10-CM | POA: Diagnosis not present

## 2021-04-17 DIAGNOSIS — R278 Other lack of coordination: Secondary | ICD-10-CM | POA: Diagnosis not present

## 2021-04-17 DIAGNOSIS — R262 Difficulty in walking, not elsewhere classified: Secondary | ICD-10-CM | POA: Diagnosis not present

## 2021-04-17 NOTE — Therapy (Signed)
[place] MAIN [facility] SERVICES [address] [place], [place], [address] Phone: [phone]   Fax:  [phone]  Physical Therapy Treatment  Patient Details  Name: Shannon Hancock MRN: [id number] Date of Birth: Jan 11, 1951 Referring Provider (PT): [name] (Sports medicine @ [facility])   Encounter Date: 04/17/2021   PT End of Session - 04/17/21 1122     Visit Number 5    Number of Visits 17    Date for PT Re-Evaluation 06/01/21    Authorization Type Auth through 12/2    Authorization - Visit Number 2    Authorization - Number of Visits 10    PT Start Time 2423    PT Stop Time 5361    PT Time Calculation (min) 43 min    Equipment Utilized During Treatment Gait belt    Activity Tolerance Patient tolerated treatment well;Patient limited by pain;Patient limited by lethargy    Behavior During Therapy [facility] for tasks assessed/performed             Past Medical History:  Diagnosis Date   Allergy    Anxiety    Breast cancer ([place]) 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; ER/PR+, Her2-, 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%   Depression    Diabetes mellitus without complication (HCC)    Diverticulosis    Fibromyalgia    GERD (gastroesophageal reflux disease)    Heart murmur    "no murmur" documented in PCP note by [name], MD 02/09/21   High cholesterol    Hypertension    Joint pain     Past Surgical History:  Procedure Laterality Date   BREAST LUMPECTOMY Right 2016   BREAST LUMPECTOMY WITH RADIOACTIVE SEED AND SENTINEL LYMPH NODE BIOPSY Right 02/13/2015   Procedure: RIGHT BREAST LUMPECTOMY WITH RADIOACTIVE SEED AND SENTINEL LYMPH NODE MAPPING;  Surgeon: [name], MD;  Location: [facility];  Service: General;  Laterality: Right;   FOOT SURGERY Left    TOTAL KNEE ARTHROPLASTY Right 03/26/2021   Procedure: TOTAL KNEE ARTHROPLASTY;  Surgeon: [name], MD;  Location: [facility];  Service: Orthopedics;  Laterality: Right;    TUBAL LIGATION      There were no vitals filed for this visit.   Subjective Assessment - 04/17/21 1111     Subjective Pt reports follow up with MD went well, continuing to have knee pain. reports she will be attending a funeral later this week and PT informed her she would need ot breing a wheelchair or walker but attendance should be okay.    Pertinent History Shannon Hancock is a 70 y.o. female presenting to physical therapy s/p R TKA 03/26/21. Pt reports to therapy amb with RW. Pt resides in a two story home with six steps to enter from the front and a ramp in the back. She reports negotiating stairs with assistance from her husband. She reports being able to negotiate and live on the main story of her home. Pt reports she would like to increase her R knee mobility, strength, and decrease her pain in order to walk without AD and go bowling. PMH includes breast cancer, fibromyalgia, DM, HTN, anxiety, depression, and hearing loss. Pt denies any unexplained weight fluctuation, saddle paresthesia, unrelenting night pain, or falls in the last 6 months.    Limitations Lifting;Walking;Standing;House hold activities    How long can you sit comfortably? 5-10 min    How long can you stand comfortably? 5 min    How long can  you walk comfortably? 5-8 minutes    Patient Stated Goals Returned and bowling and restore ROM    Pain Onset 1 to 4 weeks ago             Therex:  Nustep level 1 x 5 min  with seat level 8 for AAROM of right knee   Heel slides seated  10 x 5 sec  -cues for proper hold timed   HS stretch (seated)  2 x 30 sec -cues for maintenance of knee extension and proper hold times    Gastroc stretch (seated)  -2 x 30 sec -cues for maintenance of knee extension.    Quad sets 10 x 5 sec -VC and TC for proper muscle activation.   -On last rotation patient knee range of motion measured 15 degrees from full extension.   SAQ: 10 x 5 sec holds -cues for hold times and TC for  target to achieve greater knee extension   The following activities were completed in parallel bars or at balance bar    There Act Heel raises- sig UE support  -x20 ea LE -cues to improve knee extension   Marching - sig UE support  X 10 ea LE, -sig UE support when transferring weight to involved LE (R)   Mini squat at balance bar with 2 inch step under left lower extremity in order to improve weightbearing through right lower extremity.  X10 repetitions  Weight shifting with 2 inch step placed under pt left foot to improve weightbearing on the right side 2 x 10    Pt required occasional rest breaks due fatigue, PT was quick to ask when pt appeared to be fatiguing in order to prevent excessive fatigue.   Pt educated throughout session about proper posture and technique with exercises. Improved exercise technique, movement at target joints, use of target muscles after min to mod verbal, visual, tactile cues.                           PT Education - 04/17/21 1113     Education Details Importance of continuing with home exercises and continuing to improve knee extension range of motion    Person(s) Educated Patient    Methods Explanation;Demonstration;Tactile cues;Verbal cues    Comprehension Verbalized understanding;Returned demonstration;Verbal cues required;Tactile cues required;Need further instruction              PT Short Term Goals - 04/02/21 1332       PT SHORT TERM GOAL #1   Title Pt will demonstrate independence with HEP to improve R LE function for increased ability to participate with ADLs    Baseline HEP given    Time 4    Period Weeks    Status New    Target Date 04/30/21               PT Long Term Goals - 04/02/21 1333       PT LONG TERM GOAL #1   Title Patient will increase FOTO score to 49 to demonstrate predicted increase in functional mobility to complete ADLs    Baseline 04/02/21: 1    Time 12    Period Weeks     Status New    Target Date 06/25/21      PT LONG TERM GOAL #2   Title Pt will perform 5XSTS in 12 seconds in order to demonstrate improved LE strength and decrease the likelihood of falling.  Baseline 04/02/21: 25    Time 12    Period Weeks    Status New    Target Date 06/25/21      PT LONG TERM GOAL #3   Title Pt will actively demonstrate 0 deg R knee extension and 120 deg R knee flexion in order to safely negotiate steps and normalize gait without AD.    Baseline 04/02/21 R knee: flex 80 deg, ext: 50    Time 12    Period Weeks    Status New    Target Date 06/25/21      PT LONG TERM GOAL #4   Title Pt will demonstrate SLS of RLE of at least 10sec to demonstrate decreased fall risk and PLOF of contralateral LE    Baseline 04/02/21 LLE 10sec RLE unable    Time 8    Period Weeks    Status New                   Plan - 04/17/21 1315     Clinical Impression Statement Patient continues to demonstrate good tolerance for therapeutic exercise activities in today's session.  Patient demonstrates great motivation to complete exercises.  Patient continues to have improvements in knee extension range of motion as measured to 15 degrees from extension during quad set today.  Patient also has improvement in flexion range of motion although this was not measured during today's session it was indicative during several exercises performed and observed from physical therapist.  Patient continues to be educated regarding importance of continuing to work on home exercises diligently in order to improve her knee range of motion, improve her outcome from her surgical procedure, and improve her overall quality of life.  Patient will continue to benefit from skilled physical therapy in order to improve her knee range of motion, improve her overall mobility, improve her balance, and improve her overall health-related quality of life.    Personal Factors and Comorbidities Age;Comorbidity 2     Comorbidities DM, anxiety, depression    Examination-Activity Limitations Bed Mobility;Carry;Lift;Locomotion Level;Squat;Sit    Examination-Participation Restrictions Church;Cleaning;Community Activity;Laundry;Driving    Stability/Clinical Decision Making Evolving/Moderate complexity    Rehab Potential Good    PT Frequency 2x / week    PT Duration 12 weeks    PT Treatment/Interventions ADLs/Self Care Home Management;Gait training;Stair training;Functional mobility training;Therapeutic activities;Therapeutic exercise;Balance training;Patient/family education;Passive range of motion;Cryotherapy;Aquatic Therapy;Neuromuscular re-education;Manual techniques;Dry needling;Energy conservation;Scar mobilization;Electrical Stimulation    PT Next Visit Plan stationary bike; girth measurements    PT Home Exercise Plan Heel elevation, seated heel slides             Patient will benefit from skilled therapeutic intervention in order to improve the following deficits and impairments:  Abnormal gait, Decreased activity tolerance, Decreased balance, Decreased endurance, Decreased knowledge of use of DME, Decreased mobility, Decreased strength, Decreased range of motion, Hypomobility, Increased muscle spasms, Impaired flexibility, Decreased skin integrity, Difficulty walking, Improper body mechanics, Pain  Visit Diagnosis: Muscle weakness (generalized)  Other abnormalities of gait and mobility  Total knee replacement status, right  Chronic pain of right knee     Problem List Patient Active Problem List   Diagnosis Date Noted   OA (osteoarthritis) of knee 03/26/2021   Primary osteoarthritis of right knee 03/26/2021   Leukocytosis 04/19/2020   Thyroid cyst 02/26/2018   Plantar fasciitis 02/19/2016   Family history of breast cancer in sister 03/10/2015   Breast cancer of lower-outer quadrant of right female breast ([place]) 02/10/2015  Abdominal bloating 12/16/2013   Edema 11/09/2013   Urge  incontinence 05/05/2013   Atrophic vaginitis 05/05/2013   Syncope 02/15/2013   Irritable bowel syndrome 08/20/2012   Hearing loss 11/07/2011   Generalized anxiety disorder 11/07/2011   Peripheral neuropathy 07/05/2011   Osteoporosis 07/05/2011   Tinnitus, bilateral 04/05/2011   HYPERCHOLESTEROLEMIA 04/05/2010   ANEMIA, IRON DEFICIENCY 04/03/2010   DEPRESSION, MAJOR, MODERATE 04/03/2010   Essential hypertension 04/03/2010   ALLERGIC RHINITIS 04/03/2010   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/03/2010   POSTMENOPAUSAL SYNDROME 04/03/2010   DEGENERATIVE JOINT DISEASE, CERVICAL SPINE 04/03/2010   FIBROMYALGIA 04/03/2010    Christopher B Byrd, PT 04/17/2021, 1:18 PM  [facility] MAIN REHAB SERVICES [address] [place], [place], [address] Phone: [phone]   Fax:  [phone]  Name: Janella B Koestner MRN: [id number] Date of Birth: 01/09/1951    

## 2021-04-18 ENCOUNTER — Encounter: Payer: Medicare PPO | Admitting: Occupational Therapy

## 2021-04-19 ENCOUNTER — Ambulatory Visit: Payer: Medicare PPO | Admitting: Physical Therapy

## 2021-04-19 ENCOUNTER — Other Ambulatory Visit: Payer: Self-pay

## 2021-04-19 ENCOUNTER — Encounter: Payer: Medicare PPO | Admitting: Physical Therapy

## 2021-04-19 DIAGNOSIS — M6281 Muscle weakness (generalized): Secondary | ICD-10-CM

## 2021-04-19 DIAGNOSIS — R262 Difficulty in walking, not elsewhere classified: Secondary | ICD-10-CM | POA: Diagnosis not present

## 2021-04-19 DIAGNOSIS — R269 Unspecified abnormalities of gait and mobility: Secondary | ICD-10-CM | POA: Diagnosis not present

## 2021-04-19 DIAGNOSIS — Z96651 Presence of right artificial knee joint: Secondary | ICD-10-CM

## 2021-04-19 DIAGNOSIS — R278 Other lack of coordination: Secondary | ICD-10-CM | POA: Diagnosis not present

## 2021-04-19 DIAGNOSIS — R2681 Unsteadiness on feet: Secondary | ICD-10-CM | POA: Diagnosis not present

## 2021-04-19 DIAGNOSIS — R2689 Other abnormalities of gait and mobility: Secondary | ICD-10-CM | POA: Diagnosis not present

## 2021-04-19 DIAGNOSIS — M25561 Pain in right knee: Secondary | ICD-10-CM | POA: Diagnosis not present

## 2021-04-19 DIAGNOSIS — G8929 Other chronic pain: Secondary | ICD-10-CM | POA: Diagnosis not present

## 2021-04-19 NOTE — Therapy (Signed)
[facility] MAIN REHAB SERVICES [address] [place], [place], [address] Phone: [phone]   Fax:  [phone]  Physical Therapy Treatment  Patient Details  Name: Shannon Hancock MRN: [id number] Date of Birth: 01/27/1951 Referring Provider (PT): Adam Kendall MD ([facility] medicine @ [facility])   Encounter Date: 04/19/2021   PT End of Session - 04/19/21 1107     Visit Number 6    Number of Visits 17    Date for PT Re-Evaluation 06/01/21    Authorization Type Auth through 12/2    Authorization - Visit Number 2    Authorization - Number of Visits 10    PT Start Time 1059    PT Stop Time 1142    PT Time Calculation (min) 43 min    Equipment Utilized During Treatment Gait belt    Activity Tolerance Patient tolerated treatment well;Patient limited by pain;Patient limited by lethargy    Behavior During Therapy WFL for tasks assessed/performed             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%   Depression    Diabetes mellitus without complication (HCC)    Diverticulosis    Fibromyalgia    GERD (gastroesophageal reflux disease)    Heart murmur    "no murmur" documented in PCP note by Cynthia White, MD 02/09/21   High cholesterol    Hypertension    Joint pain     Past Surgical History:  Procedure Laterality Date   BREAST LUMPECTOMY Right 2016   BREAST LUMPECTOMY WITH RADIOACTIVE SEED AND SENTINEL LYMPH NODE BIOPSY Right 02/13/2015   Procedure: RIGHT BREAST LUMPECTOMY WITH RADIOACTIVE SEED AND SENTINEL LYMPH NODE MAPPING;  Surgeon: Paul Toth III, MD;  Location: [facility];  Service: General;  Laterality: Right;   FOOT SURGERY Left    TOTAL KNEE ARTHROPLASTY Right 03/26/2021   Procedure: TOTAL KNEE ARTHROPLASTY;  Surgeon: Aluisio, Frank, MD;  Location[facility];  Service: Orthopedics;  Laterality: Right;    TUBAL LIGATION      There were no vitals filed for this visit.   Subjective Assessment - 04/19/21 1105     Subjective Pt reports she ahs had a lot of fatigue and general pain over the past several days. believes it could be fibromyalgia flare up. Has been resting alot of the past 2 days.    Pertinent History Shannon Hancock is a 70 y.o. female presenting to physical therapy s/p R TKA 03/26/21. Pt reports to therapy amb with RW. Pt resides in a two story home with six steps to enter from the front and a ramp in the back. She reports negotiating stairs with assistance from her husband. She reports being able to negotiate and live on the main story of her home. Pt reports she would like to increase her R knee mobility, strength, and decrease her pain in order to walk without AD and go bowling. PMH includes breast cancer, fibromyalgia, DM, HTN, anxiety, depression, and hearing loss. Pt denies any unexplained weight fluctuation, saddle paresthesia, unrelenting night pain, or falls in the last 6 months.    Limitations Lifting;Walking;Standing;House hold activities    How long can you sit comfortably? 5-10 min    How long can you stand comfortably? 5 min    How long can you walk comfortably? 5-8 minutes      Patient Stated Goals Returned and bowling and restore ROM    Pain Score 8     Pain Location Knee    Pain Orientation Right    Pain Descriptors / Indicators Aching    Pain Onset 1 to 4 weeks ago    Aggravating Factors  moving, standing    Pain Relieving Factors medication    Effect of Pain on Daily Activities limited            Therex:  Nustep level 1 x 5 min  with seat level 8 for AAROM of right knee   LAQ followed by knee flexion seated with self overpressure with contralateral LE for flexion x 10 each direction with 5 sec holds    HS stretch (seated)  2 x 30 sec -cues for maintenance of knee extension and proper hold times    Gastroc stretch (seated)  -2 x 30 sec -cues for  maintenance of knee extension.   Supine passive knee extension with heel prop, bolster parallel to R LE to prevent ER at hip and 1x5#AW and 1x3# ankle weight above and below knee respectively x 3 min  -pt instructed to take deep breathes to allow knee to relax    Quad sets 10 x 5 sec -VC and TC for proper muscle activation.   -On last rotation patient knee range of motion measured 15 degrees from full extension.   SAQ: 10 x 5 sec holds -cues for hold times and TC for target to achieve greater knee extension   The following activities were completed in parallel bars or at balance bar     Heel raises- sig UE support  -x20 ea LE -cues to improve knee extension   Marching - sig UE support  X 10 ea LE, -sig UE support when transferring weight to involved LE (R)    Pt required occasional rest breaks due fatigue, PT was quick to ask when pt appeared to be fatiguing in order to prevent excessive fatigue.   Pt educated throughout session about proper posture and technique with exercises. Improved exercise technique, movement at target joints, use of target muscles after min to mod verbal, visual, tactile cues.                              PT Education - 04/19/21 1106     Education Details Importance of regular exercise with fibromayalgia s/s    Person(s) Educated Patient    Methods Explanation    Comprehension Verbalized understanding              PT Short Term Goals - 04/02/21 1332       PT SHORT TERM GOAL #1   Title Pt will demonstrate independence with HEP to improve R LE function for increased ability to participate with ADLs    Baseline HEP given    Time 4    Period Weeks    Status New    Target Date 04/30/21               PT Long Term Goals - 04/02/21 1333       PT LONG TERM GOAL #1   Title Patient will increase FOTO score to 49 to demonstrate predicted increase in functional mobility to complete ADLs    Baseline 04/02/21: 1     Time 12    Period Weeks    Status New    Target Date 06/25/21  PT LONG TERM GOAL #2   Title Pt will perform 5XSTS in 12 seconds in order to demonstrate improved LE strength and decrease the likelihood of falling.    Baseline 04/02/21: 25    Time 12    Period Weeks    Status New    Target Date 06/25/21      PT LONG TERM GOAL #3   Title Pt will actively demonstrate 0 deg R knee extension and 120 deg R knee flexion in order to safely negotiate steps and normalize gait without AD.    Baseline 04/02/21 R knee: flex 80 deg, ext: 50    Time 12    Period Weeks    Status New    Target Date 06/25/21      PT LONG TERM GOAL #4   Title Pt will demonstrate SLS of RLE of at least 10sec to demonstrate decreased fall risk and PLOF of contralateral LE    Baseline 04/02/21 LLE 10sec RLE unable    Time 8    Period Weeks    Status New                   Plan - 04/19/21 1112     Clinical Impression Statement Pt tolerated trestment session well and continues to demonstrate continued imrpvement in knee function, strength, and rom despote continued complaints of knee pain. Pt continues to requires min/ mod cues for proper performance of exercises but following initial cues pt able to copmlete exercises with fair efficacy. Pt will continue to benefit from skilled PT intervention in order to improve her R LE strength, ROM, function and improve her ambulatory capacity and balance.    Personal Factors and Comorbidities Age;Comorbidity 2    Comorbidities DM, anxiety, depression    Examination-Activity Limitations Bed Mobility;Carry;Lift;Locomotion Level;Squat;Sit    Examination-Participation Restrictions Church;Cleaning;Community Activity;Laundry;Driving    Stability/Clinical Decision Making Evolving/Moderate complexity    Rehab Potential Good    PT Frequency 2x / week    PT Duration 12 weeks    PT Treatment/Interventions ADLs/Self Care Home Management;Gait training;Stair training;Functional  mobility training;Therapeutic activities;Therapeutic exercise;Balance training;Patient/family education;Passive range of motion;Cryotherapy;Aquatic Therapy;Neuromuscular re-education;Manual techniques;Dry needling;Energy conservation;Scar mobilization;Electrical Stimulation    PT Next Visit Plan stationary bike; girth measurements    PT Home Exercise Plan Heel elevation, seated heel slides             Patient will benefit from skilled therapeutic intervention in order to improve the following deficits and impairments:  Abnormal gait, Decreased activity tolerance, Decreased balance, Decreased endurance, Decreased knowledge of use of DME, Decreased mobility, Decreased strength, Decreased range of motion, Hypomobility, Increased muscle spasms, Impaired flexibility, Decreased skin integrity, Difficulty walking, Improper body mechanics, Pain  Visit Diagnosis: Muscle weakness (generalized)  Other abnormalities of gait and mobility  Total knee replacement status, right  Chronic pain of right knee     Problem List Patient Active Problem List   Diagnosis Date Noted   OA (osteoarthritis) of knee 03/26/2021   Primary osteoarthritis of right knee 03/26/2021   Leukocytosis 04/19/2020   Thyroid cyst 02/26/2018   Plantar fasciitis 02/19/2016   Family history of breast cancer in sister 03/10/2015   Breast cancer of lower-outer quadrant of right female breast ([facility]) 02/10/2015   Abdominal bloating 12/16/2013   Edema 11/09/2013   Urge incontinence 05/05/2013   Atrophic vaginitis 05/05/2013   Syncope 02/15/2013   Irritable bowel syndrome 08/20/2012   Hearing loss 11/07/2011   Generalized anxiety disorder 11/07/2011   Peripheral neuropathy   07/05/2011   Osteoporosis 07/05/2011   Tinnitus, bilateral 04/05/2011   HYPERCHOLESTEROLEMIA 04/05/2010   ANEMIA, IRON DEFICIENCY 04/03/2010   DEPRESSION, MAJOR, MODERATE 04/03/2010   Essential hypertension 04/03/2010   ALLERGIC RHINITIS 04/03/2010    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/03/2010   POSTMENOPAUSAL SYNDROME 04/03/2010   DEGENERATIVE JOINT DISEASE, CERVICAL SPINE 04/03/2010   FIBROMYALGIA 04/03/2010    Particia Lather, PT 04/19/2021, 1:05 PM  [place] MAIN [facility] SERVICES [address] [place], [place], [address] Phone: [phone]   Fax:  [phone]  Name: LONNETTE SHRODE MRN: [id number] Date of Birth: January 20, 1951

## 2021-04-24 ENCOUNTER — Ambulatory Visit: Payer: Medicare PPO | Admitting: Physical Therapy

## 2021-04-24 ENCOUNTER — Encounter: Payer: Self-pay | Admitting: Physical Therapy

## 2021-04-24 ENCOUNTER — Other Ambulatory Visit: Payer: Self-pay

## 2021-04-24 ENCOUNTER — Encounter: Payer: Medicare PPO | Admitting: Physical Therapy

## 2021-04-24 DIAGNOSIS — Z96651 Presence of right artificial knee joint: Secondary | ICD-10-CM | POA: Diagnosis not present

## 2021-04-24 DIAGNOSIS — R262 Difficulty in walking, not elsewhere classified: Secondary | ICD-10-CM

## 2021-04-24 DIAGNOSIS — R2689 Other abnormalities of gait and mobility: Secondary | ICD-10-CM

## 2021-04-24 DIAGNOSIS — R269 Unspecified abnormalities of gait and mobility: Secondary | ICD-10-CM | POA: Diagnosis not present

## 2021-04-24 DIAGNOSIS — R278 Other lack of coordination: Secondary | ICD-10-CM | POA: Diagnosis not present

## 2021-04-24 DIAGNOSIS — R2681 Unsteadiness on feet: Secondary | ICD-10-CM

## 2021-04-24 DIAGNOSIS — M6281 Muscle weakness (generalized): Secondary | ICD-10-CM | POA: Diagnosis not present

## 2021-04-24 DIAGNOSIS — G8929 Other chronic pain: Secondary | ICD-10-CM | POA: Diagnosis not present

## 2021-04-24 DIAGNOSIS — M25561 Pain in right knee: Secondary | ICD-10-CM | POA: Diagnosis not present

## 2021-04-24 NOTE — Therapy (Signed)
[place] MAIN [facility] SERVICES [address] [place], [place], [address] Phone: [phone]   Fax:  [phone]  Physical Therapy Treatment  Patient Details  Name: Shannon Hancock MRN: [id number] Date of Birth: 1951-01-15 Referring Provider (PT): [name] (Sports medicine @ [facility])   Encounter Date: 04/24/2021   PT End of Session - 04/24/21 1646     Visit Number 7    Number of Visits 17    Date for PT Re-Evaluation 06/01/21    Authorization Type Auth through 12/2    Authorization - Visit Number 2    Authorization - Number of Visits 10    PT Start Time 2694    PT Stop Time 1145    PT Time Calculation (min) 42 min    Equipment Utilized During Treatment Gait belt    Activity Tolerance Patient tolerated treatment well;Patient limited by pain;Patient limited by lethargy    Behavior During Therapy [facility] for tasks assessed/performed             Past Medical History:  Diagnosis Date   Allergy    Anxiety    Breast cancer ([place]) 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; ER/PR+, Her2-, 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%   Depression    Diabetes mellitus without complication (HCC)    Diverticulosis    Fibromyalgia    GERD (gastroesophageal reflux disease)    Heart murmur    "no murmur" documented in PCP note by [name], MD 02/09/21   High cholesterol    Hypertension    Joint pain     Past Surgical History:  Procedure Laterality Date   BREAST LUMPECTOMY Right 2016   BREAST LUMPECTOMY WITH RADIOACTIVE SEED AND SENTINEL LYMPH NODE BIOPSY Right 02/13/2015   Procedure: RIGHT BREAST LUMPECTOMY WITH RADIOACTIVE SEED AND SENTINEL LYMPH NODE MAPPING;  Surgeon: [name], MD;  Location: [place];  Service: General;  Laterality: Right;   FOOT SURGERY Left    TOTAL KNEE ARTHROPLASTY Right 03/26/2021   Procedure: TOTAL KNEE ARTHROPLASTY;  Surgeon: [name], MD;  Location: [facility];  Service: Orthopedics;  Laterality: Right;    TUBAL LIGATION      There were no vitals filed for this visit.   Subjective Assessment - 04/24/21 1108     Subjective Pt reports falling yesterday, forward off of the commode landing on R knee with no bracing of UEs. Pt states she feels the "after effects" of the fall today including increased achiness. She also reports she forgot to take her pain meds this morning. Pt states she has a follow up appointment with orthopedic surgeon next week.    Pertinent History Shannon Hancock is a 70 y.o. female presenting to physical therapy s/p R TKA 03/26/21. Pt reports to therapy amb with RW. Pt resides in a two story home with six steps to enter from the front and a ramp in the back. She reports negotiating stairs with assistance from her husband. She reports being able to negotiate and live on the main story of her home. Pt reports she would like to increase her R knee mobility, strength, and decrease her pain in order to walk without AD and go bowling. PMH includes breast cancer, fibromyalgia, DM, HTN, anxiety, depression, and hearing loss. Pt denies any unexplained weight fluctuation, saddle paresthesia, unrelenting night pain, or falls in the last 6 months.    Currently in Pain? Yes    Pain Score 8     Pain Location  Knee    Pain Orientation Right    Pain Descriptors / Indicators Aching               Therex:  Nustep, BLE only, level 1 x 5 min  with seat level 8 for AAROM of right knee; encouraged SPM >60.   LAQ x 5 with 5 sec holds. AAROM to assist through increased ROM. Pain reported with increased range.  Supine passive knee extension with heel prop on bolster, towel roll propped at lateral ankle to prevent ER. 5#AW below knee and 3# ankle weight above knee x 4 min. -pt instructed to take deep breathes to allow knee to relax     Quad sets 10 x 5 second hold -VC and TC for proper muscle activation.  -difficulty recruiting quad activation.  Heel slides  10 x 5 second hold  -pillow  case under foot   Manual Therapy/ROM measurements   HS stretch, 2 x 45 sec Gastroc stretch, 2 x 45 sec  Flexion: 104* Extension: lacking 19*     Pt required frequent rest breaks due pain.    Pt educated throughout session about proper technique and muscle activation with exercises. Improved exercise technique, movement at target joints, use of target muscles after min to mod verbal, visual, tactile cues.    Clinical Impression: Pt arrives to therapy with good motivation to participate although she does state she hasn't been feeling well and did have a fall last night. Her right (surgical) knee is now experiencing increased pain, especially with ambulation. She also forgot to take her pain meds this morning. Today's session was significantly limited by pain. PT encouraged pt to monitor symptoms and call orthopedic surgeon if pain persists or pt is concerned. No focal pain was noted with light palpation, no bruising or skin lesions. Session focused on obtaining greater knee extension. She demo decreased extension from previous session (lacking 19* today, 15* last session). Education on pain modulation provided; will attempt to return to previous level of therapeutic exercises next session per pt pain report/tolerance. Pt will benefit from continued skilled PT intervention in order to improve RLE strength, ROM, function and improve her ambulatory capacity and balance.            PT Short Term Goals - 04/02/21 1332       PT SHORT TERM GOAL #1   Title Pt will demonstrate independence with HEP to improve R LE function for increased ability to participate with ADLs    Baseline HEP given    Time 4    Period Weeks    Status New    Target Date 04/30/21               PT Long Term Goals - 04/02/21 1333       PT LONG TERM GOAL #1   Title Patient will increase FOTO score to 49 to demonstrate predicted increase in functional mobility to complete ADLs    Baseline 04/02/21: 1    Time  12    Period Weeks    Status New    Target Date 06/25/21      PT LONG TERM GOAL #2   Title Pt will perform 5XSTS in 12 seconds in order to demonstrate improved LE strength and decrease the likelihood of falling.    Baseline 04/02/21: 25    Time 12    Period Weeks    Status New    Target Date 06/25/21      PT LONG TERM  GOAL #3   Title Pt will actively demonstrate 0 deg R knee extension and 120 deg R knee flexion in order to safely negotiate steps and normalize gait without AD.    Baseline 04/02/21 R knee: flex 80 deg, ext: 50    Time 12    Period Weeks    Status New    Target Date 06/25/21      PT LONG TERM GOAL #4   Title Pt will demonstrate SLS of RLE of at least 10sec to demonstrate decreased fall risk and PLOF of contralateral LE    Baseline 04/02/21 LLE 10sec RLE unable    Time 8    Period Weeks    Status New                   Plan - 04/24/21 1647     Clinical Impression Statement Pt arrives to therapy with good motivation to participate although she does state she hasn't been feeling well and did have a fall last night. Her right (surgical) knee is now experiencing increased pain, especially with ambulation. She also forgot to take her pain meds this morning. Today's session was significantly limited by pain. PT encouraged pt to monitor symptoms and call orthopedic surgeon if pain persists or pt is concerned. No focal pain was noted with light palpation, no bruising or skin lesions. Session focused on obtaining greater knee extension. She demo decreased extension from previous session (lacking 19* today, 15* last session). Education on pain modulation provided; will attempt to return to previous level of therapeutic exercises next session per pt pain report/tolerance. Pt will benefit from continued skilled PT intervention in order to improve RLE strength, ROM, function and improve her ambulatory capacity and balance.    Personal Factors and Comorbidities Age;Comorbidity 2     Comorbidities DM, anxiety, depression    Examination-Activity Limitations Bed Mobility;Carry;Lift;Locomotion Level;Squat;Sit    Examination-Participation Restrictions Church;Cleaning;Community Activity;Laundry;Driving    Stability/Clinical Decision Making Evolving/Moderate complexity    Rehab Potential Good    PT Frequency 2x / week    PT Duration 12 weeks    PT Treatment/Interventions ADLs/Self Care Home Management;Gait training;Stair training;Functional mobility training;Therapeutic activities;Therapeutic exercise;Balance training;Patient/family education;Passive range of motion;Cryotherapy;Aquatic Therapy;Neuromuscular re-education;Manual techniques;Dry needling;Energy conservation;Scar mobilization;Electrical Stimulation    PT Next Visit Plan stationary bike; girth measurements    PT Home Exercise Plan Heel elevation, seated heel slides             Patient will benefit from skilled therapeutic intervention in order to improve the following deficits and impairments:  Abnormal gait, Decreased activity tolerance, Decreased balance, Decreased endurance, Decreased knowledge of use of DME, Decreased mobility, Decreased strength, Decreased range of motion, Hypomobility, Increased muscle spasms, Impaired flexibility, Decreased skin integrity, Difficulty walking, Improper body mechanics, Pain  Visit Diagnosis: Abnormality of gait and mobility  Other abnormalities of gait and mobility  Difficulty in walking, not elsewhere classified  Other lack of coordination  Muscle weakness (generalized)  Unsteadiness on feet  Total knee replacement status, right     Problem List Patient Active Problem List   Diagnosis Date Noted   OA (osteoarthritis) of knee 03/26/2021   Primary osteoarthritis of right knee 03/26/2021   Leukocytosis 04/19/2020   Thyroid cyst 02/26/2018   Plantar fasciitis 02/19/2016   Family history of breast cancer in sister 03/10/2015   Breast cancer of lower-outer  quadrant of right female breast ([place]) 02/10/2015   Abdominal bloating 12/16/2013   Edema 11/09/2013   Urge incontinence 05/05/2013  Atrophic vaginitis 05/05/2013   Syncope 02/15/2013   Irritable bowel syndrome 08/20/2012   Hearing loss 11/07/2011   Generalized anxiety disorder 11/07/2011   Peripheral neuropathy 07/05/2011   Osteoporosis 07/05/2011   Tinnitus, bilateral 04/05/2011   HYPERCHOLESTEROLEMIA 04/05/2010   ANEMIA, IRON DEFICIENCY 04/03/2010   DEPRESSION, MAJOR, MODERATE 04/03/2010   Essential hypertension 04/03/2010   ALLERGIC RHINITIS 04/03/2010   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/03/2010   POSTMENOPAUSAL SYNDROME 04/03/2010   DEGENERATIVE JOINT DISEASE, CERVICAL SPINE 04/03/2010   FIBROMYALGIA 04/03/2010    Patrina Levering PT, DPT  04/24/2021, 5:05 PM  [place] MAIN [facility] SERVICES [address] [place], [place], [address] Phone: [phone]   Fax:  [phone]  Name: Shannon Hancock MRN: [id number] Date of Birth: 06-Jan-1951

## 2021-04-25 ENCOUNTER — Encounter: Payer: Medicare PPO | Admitting: Occupational Therapy

## 2021-04-26 ENCOUNTER — Ambulatory Visit: Payer: Medicare PPO | Admitting: Physical Therapy

## 2021-04-26 ENCOUNTER — Other Ambulatory Visit: Payer: Self-pay

## 2021-04-26 ENCOUNTER — Encounter: Payer: Medicare PPO | Admitting: Physical Therapy

## 2021-04-26 DIAGNOSIS — R2681 Unsteadiness on feet: Secondary | ICD-10-CM

## 2021-04-26 DIAGNOSIS — G8929 Other chronic pain: Secondary | ICD-10-CM

## 2021-04-26 DIAGNOSIS — M25561 Pain in right knee: Secondary | ICD-10-CM | POA: Diagnosis not present

## 2021-04-26 DIAGNOSIS — R269 Unspecified abnormalities of gait and mobility: Secondary | ICD-10-CM | POA: Diagnosis not present

## 2021-04-26 DIAGNOSIS — Z96651 Presence of right artificial knee joint: Secondary | ICD-10-CM | POA: Diagnosis not present

## 2021-04-26 DIAGNOSIS — R2689 Other abnormalities of gait and mobility: Secondary | ICD-10-CM | POA: Diagnosis not present

## 2021-04-26 DIAGNOSIS — R278 Other lack of coordination: Secondary | ICD-10-CM | POA: Diagnosis not present

## 2021-04-26 DIAGNOSIS — R262 Difficulty in walking, not elsewhere classified: Secondary | ICD-10-CM

## 2021-04-26 DIAGNOSIS — M6281 Muscle weakness (generalized): Secondary | ICD-10-CM | POA: Diagnosis not present

## 2021-04-26 NOTE — Therapy (Signed)
[place] MAIN [facility] SERVICES [address] [place], [place], [address] Phone: [phone]   Fax:  [phone]  Physical Therapy Treatment  Patient Details  Name: Shannon Hancock MRN: [id number] Date of Birth: [birth year] Referring Provider (PT): [name] (Sports medicine @ [facility] [place])   Encounter Date: 04/26/2021   PT End of Session - 04/26/21 1501     Visit Number 8    Number of Visits 17    Date for PT Re-Evaluation 06/01/21    Authorization Type Auth through 12/2    Authorization - Visit Number 7    Authorization - Number of Visits 10    PT Start Time 1300    PT Stop Time 1344    PT Time Calculation (min) 44 min    Equipment Utilized During Treatment Gait belt    Activity Tolerance Patient tolerated treatment well;Patient limited by pain;Patient limited by lethargy    Behavior During Therapy 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%   Depression    Diabetes mellitus without complication (HCC)    Diverticulosis    Fibromyalgia    GERD (gastroesophageal reflux disease)    Heart murmur    "no murmur" documented in PCP note by [name], MD 02/09/21   High cholesterol    Hypertension    Joint pain     Past Surgical History:  Procedure Laterality Date   BREAST LUMPECTOMY Right 2016   BREAST LUMPECTOMY WITH RADIOACTIVE SEED AND SENTINEL LYMPH NODE BIOPSY Right 02/13/2015   Procedure: RIGHT BREAST LUMPECTOMY WITH RADIOACTIVE SEED AND SENTINEL LYMPH NODE MAPPING;  Surgeon: [name], MD;  Location: [place];  Service: General;  Laterality: Right;   FOOT SURGERY Left    TOTAL KNEE ARTHROPLASTY Right 03/26/2021   Procedure: TOTAL KNEE ARTHROPLASTY;  Surgeon: [name], MD;  [facility];  Service: Orthopedics;  Laterality: Right;    TUBAL LIGATION      There were no vitals filed for this visit.   Subjective Assessment - 04/26/21 1453     Subjective Pt reports feeling better following fall earlier this week. Reports knee pain has improved significantly since previous visit.  Reports home exercises have been going well and his light fall did not cause any significant setbacks.    Pertinent History Shannon Hancock is a 70 y.o. female presenting to physical therapy s/p R TKA 03/26/21. Pt reports to therapy amb with RW. Pt resides in a two story home with six steps to enter from the front and a ramp in the back. She reports negotiating stairs with assistance from her husband. She reports being able to negotiate and live on the main story of her home. Pt reports she would like to increase her R knee mobility, strength, and decrease her pain in order to walk without AD and go bowling. PMH includes breast cancer, fibromyalgia, DM, HTN, anxiety, depression, and hearing loss. Pt denies any unexplained weight fluctuation, saddle paresthesia, unrelenting night pain, or falls in the last 6 months.    Limitations Lifting;Walking;Standing;House hold activities    How long can you sit comfortably? 5-10 min    How long can you stand comfortably? 5 min    How long can you walk comfortably? 5-8 minutes  Patient Stated Goals Returned and bowling and restore ROM    Currently in Pain? Yes    Pain Score 6     Pain Location Knee    Pain Orientation Right    Pain Descriptors / Indicators Aching    Pain Type Surgical pain    Pain Onset 1 to 4 weeks ago    Aggravating Factors  standing    Pain Relieving Factors medication                  Therex:  Nustep level 2 x 6 min  with seat level 8 for AAROM of right knee (utilization of UE)    LAQ followed by knee flexion seated with self overpressure with contralateral LE for flexion x 10 each direction with 5 sec holds    HS stretch (seated)  2 x 30 sec -cues for maintenance of knee  extension and proper hold times    Gastroc stretch (seated)  -2 x 30 sec -cues for maintenance of knee extension on hold time    Supine passive knee extension with heel prop, bolster parallel to R LE to prevent ER at hip and 1x5#AW and 1x3# ankle weight above and below knee respectively x 3 min  -pt instructed to take deep breathes to allow knee to relax     Quad sets 10 x 5 sec -VC and TC for proper muscle activation.   -On last rotation patient knee range of motion measured 15 degrees from full extension.   SAQ: 10 x 5 sec holds -cues for hold times and TC for target to achieve greater knee extension   The following activities were completed in parallel bars or at balance bar      Heel raises- sig UE support  -x20 ea LE -cues to improve knee extension   Marching - sig UE support  X 10 ea LE, -sig UE support when transferring weight to involved LE (R)    Mini squat x 10 with cues  Pt required occasional rest breaks due fatigue, PT was quick to ask when pt appeared to be fatiguing in order to prevent excessive fatigue.   Manual Therapy Gentle overpressure at terminal knee extension with towel roll under heel. X 3 min  Gentle passive knee flexion to end range with gentle end range overpressure x 3 min Gentle patellar mobilizations x 2 min (superior and inferior)    Pt educated throughout session about proper posture and technique with exercises. Improved exercise technique, movement at target joints, use of target muscles after min to mod verbal, visual, tactile cues.                      PT Education - 04/26/21 1459     Education Details Excecise form and technique    Person(s) Educated Patient    Methods Explanation    Comprehension Verbalized understanding              PT Short Term Goals - 04/02/21 1332       PT SHORT TERM GOAL #1   Title Pt will demonstrate independence with HEP to improve R LE function for increased ability to  participate with ADLs    Baseline HEP given    Time 4    Period Weeks    Status New    Target Date 04/30/21               PT Long Term Goals - 04/02/21 1333  PT LONG TERM GOAL #1   Title Patient will increase FOTO score to 49 to demonstrate predicted increase in functional mobility to complete ADLs    Baseline 04/02/21: 1    Time 12    Period Weeks    Status New    Target Date 06/25/21      PT LONG TERM GOAL #2   Title Pt will perform 5XSTS in 12 seconds in order to demonstrate improved LE strength and decrease the likelihood of falling.    Baseline 04/02/21: 25    Time 12    Period Weeks    Status New    Target Date 06/25/21      PT LONG TERM GOAL #3   Title Pt will actively demonstrate 0 deg R knee extension and 120 deg R knee flexion in order to safely negotiate steps and normalize gait without AD.    Baseline 04/02/21 R knee: flex 80 deg, ext: 50    Time 12    Period Weeks    Status New    Target Date 06/25/21      PT LONG TERM GOAL #4   Title Pt will demonstrate SLS of RLE of at least 10sec to demonstrate decreased fall risk and PLOF of contralateral LE    Baseline 04/02/21 LLE 10sec RLE unable    Time 8    Period Weeks    Status New                   Plan - 04/26/21 1501     Clinical Impression Statement Patient presents to therapy with continued good motivation to participate.  Patient reports improvement in right knee pain following fall where she hit her knee earlier this week.  Patient was taught how to ambulate with a cane and replacement of rolling walker in order to improve her mobility level and function.  Session continue to focus on obtaining improved knee extension, patient able to perform several exercises with improved form with improved knee extension and standing up patient still significantly lacks terminal knee extension.  Patient measured 15 degrees from full knee extension today's indicating increased since last session where she  was lacking 19.  Patient also measured at 113 degrees of knee flexion today indicating continued improvement in knee flexion range of motion.  Patient will continue to benefit from skilled physical therapy intervention in order to improve right lower extremity strength, range of motion, function, and improve her ambulatory capacity and safety with daily activities.    Personal Factors and Comorbidities Age;Comorbidity 2    Comorbidities DM, anxiety, depression    Examination-Activity Limitations Bed Mobility;Carry;Lift;Locomotion Level;Squat;Sit    Examination-Participation Restrictions Church;Cleaning;Community Activity;Laundry;Driving    Stability/Clinical Decision Making Evolving/Moderate complexity    Rehab Potential Good    PT Frequency 2x / week    PT Duration 12 weeks    PT Treatment/Interventions ADLs/Self Care Home Management;Gait training;Stair training;Functional mobility training;Therapeutic activities;Therapeutic exercise;Balance training;Patient/family education;Passive range of motion;Cryotherapy;Aquatic Therapy;Neuromuscular re-education;Manual techniques;Dry needling;Energy conservation;Scar mobilization;Electrical Stimulation    PT Next Visit Plan stationary bike; girth measurements    PT Home Exercise Plan Heel elevation, seated heel slides             Patient will benefit from skilled therapeutic intervention in order to improve the following deficits and impairments:  Abnormal gait, Decreased activity tolerance, Decreased balance, Decreased endurance, Decreased knowledge of use of DME, Decreased mobility, Decreased strength, Decreased range of motion, Hypomobility, Increased muscle spasms, Impaired flexibility, Decreased skin integrity,  Difficulty walking, Improper body mechanics, Pain  Visit Diagnosis: Abnormality of gait and mobility  Other abnormalities of gait and mobility  Difficulty in walking, not elsewhere classified  Total knee replacement status,  right  Chronic pain of right knee  Unsteadiness on feet     Problem List Patient Active Problem List   Diagnosis Date Noted   OA (osteoarthritis) of knee 03/26/2021   Primary osteoarthritis of right knee 03/26/2021   Leukocytosis 04/19/2020   Thyroid cyst 02/26/2018   Plantar fasciitis 02/19/2016   Family history of breast cancer in sister 03/10/2015   Breast cancer of lower-outer quadrant of right female breast ([place]) 02/10/2015   Abdominal bloating 12/16/2013   Edema 11/09/2013   Urge incontinence 05/05/2013   Atrophic vaginitis 05/05/2013   Syncope 02/15/2013   Irritable bowel syndrome 08/20/2012   Hearing loss 11/07/2011   Generalized anxiety disorder 11/07/2011   Peripheral neuropathy 07/05/2011   Osteoporosis 07/05/2011   Tinnitus, bilateral 04/05/2011   HYPERCHOLESTEROLEMIA 04/05/2010   ANEMIA, IRON DEFICIENCY 04/03/2010   DEPRESSION, MAJOR, MODERATE 04/03/2010   Essential hypertension 04/03/2010   ALLERGIC RHINITIS 04/03/2010   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/03/2010   POSTMENOPAUSAL SYNDROME 04/03/2010   DEGENERATIVE JOINT DISEASE, CERVICAL SPINE 04/03/2010   FIBROMYALGIA 04/03/2010    Shannon Hancock, PT 04/26/2021, 3:05 PM  [place] MAIN [facility] SERVICES [address] [place], [place], [address] Phone: [phone]   Fax:  [phone]  Name: Shannon Hancock MRN: [id number] Date of Birth: [birth year]

## 2021-05-01 ENCOUNTER — Ambulatory Visit: Payer: Medicare PPO | Attending: Sports Medicine | Admitting: Physical Therapy

## 2021-05-01 ENCOUNTER — Encounter: Payer: Medicare PPO | Admitting: Physical Therapy

## 2021-05-01 ENCOUNTER — Other Ambulatory Visit: Payer: Self-pay

## 2021-05-01 DIAGNOSIS — R262 Difficulty in walking, not elsewhere classified: Secondary | ICD-10-CM | POA: Insufficient documentation

## 2021-05-01 DIAGNOSIS — Z96651 Presence of right artificial knee joint: Secondary | ICD-10-CM | POA: Insufficient documentation

## 2021-05-01 DIAGNOSIS — M6281 Muscle weakness (generalized): Secondary | ICD-10-CM | POA: Insufficient documentation

## 2021-05-01 DIAGNOSIS — R278 Other lack of coordination: Secondary | ICD-10-CM | POA: Insufficient documentation

## 2021-05-01 DIAGNOSIS — R2681 Unsteadiness on feet: Secondary | ICD-10-CM | POA: Diagnosis not present

## 2021-05-01 DIAGNOSIS — R2689 Other abnormalities of gait and mobility: Secondary | ICD-10-CM | POA: Insufficient documentation

## 2021-05-01 DIAGNOSIS — Z471 Aftercare following joint replacement surgery: Secondary | ICD-10-CM | POA: Diagnosis not present

## 2021-05-01 DIAGNOSIS — R269 Unspecified abnormalities of gait and mobility: Secondary | ICD-10-CM | POA: Insufficient documentation

## 2021-05-01 NOTE — Therapy (Signed)
[place] MAIN [facility] SERVICES [address] [place], [place], [address] Phone: [phone]   Fax:  [phone]  Physical Therapy Treatment  Patient Details  Name: Shannon Hancock MRN: [id number] Date of Birth: Sep 15, 1950 Referring Provider (PT): [name] (Sports medicine @ [facility] [place])   Encounter Date: 05/01/2021   PT End of Session - 05/01/21 1256     Visit Number 9    Number of Visits 17    Date for PT Re-Evaluation 06/01/21    Authorization Type Auth through 12/2    Authorization - Visit Number 7    Authorization - Number of Visits 10    PT Start Time 1100    PT Stop Time 6073    PT Time Calculation (min) 52 min    Activity Tolerance Patient tolerated treatment well;Patient limited by pain;Patient limited by lethargy    Behavior During Therapy [facility] for tasks assessed/performed             Past Medical History:  Diagnosis Date   Allergy    Anxiety    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; ER/PR+, Her2-, 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%   Depression    Diabetes mellitus without complication (HCC)    Diverticulosis    Fibromyalgia    GERD (gastroesophageal reflux disease)    Heart murmur    "no murmur" documented in PCP note by [name], MD 02/09/21   High cholesterol    Hypertension    Joint pain     Past Surgical History:  Procedure Laterality Date   BREAST LUMPECTOMY Right 2016   BREAST LUMPECTOMY WITH RADIOACTIVE SEED AND SENTINEL LYMPH NODE BIOPSY Right 02/13/2015   Procedure: RIGHT BREAST LUMPECTOMY WITH RADIOACTIVE SEED AND SENTINEL LYMPH NODE MAPPING;  Surgeon: [name], MD;  Location: [place];  Service: General;  Laterality: Right;   FOOT SURGERY Left    TOTAL KNEE ARTHROPLASTY Right 03/26/2021   Procedure: TOTAL KNEE ARTHROPLASTY;  Surgeon: [name], MD;  Location[facility];  Service: Orthopedics;  Laterality: Right;   TUBAL LIGATION      There were no vitals filed for  this visit.   Subjective Assessment - 05/01/21 1129     Subjective Pt reports she was at the ER with her sister all day yesterday. She missed her pain meds and woke up tis morning with 9/10 pain. Pain has decreased to 6/10 at beginning of session. She also reports increased swelling surrounding the knee joint this morning. Pt has a follow up appointment with orthopedic surgeon this afternoon.    Pertinent History Shannon Hancock is a 70 y.o. female presenting to physical therapy s/p R TKA 03/26/21. Pt reports to therapy amb with RW. Pt resides in a two story home with six steps to enter from the front and a ramp in the back. She reports negotiating stairs with assistance from her husband. She reports being able to negotiate and live on the main story of her home. Pt reports she would like to increase her R knee mobility, strength, and decrease her pain in order to walk without AD and go bowling. PMH includes breast cancer, fibromyalgia, DM, HTN, anxiety, depression, and hearing loss. Pt denies any unexplained weight fluctuation, saddle paresthesia, unrelenting night pain, or falls in the last 6 months.    Limitations Lifting;Walking;Standing;House hold activities    How long can you sit comfortably? 5-10 min    How long can you stand comfortably? 5  min    How long can you walk comfortably? 5-8 minutes    Currently in Pain? Yes    Pain Score 6     Pain Location Knee    Pain Orientation Right    Pain Descriptors / Indicators Aching    Pain Type Surgical pain                Therex:  Nustep, LE only, level 1 x 6 min with seat level 8 for AAROM of right knee (utilization of UE)    LAQ followed by knee flexion seated with self overpressure with contralateral LE for flexion x 10 each direction with 5 sec holds    HS stretch (seated)  2 x 30 sec -cues for maintenance of knee extension and proper hold times    Gastroc stretch (seated)  -2 x 30 sec -cues for maintenance of knee extension  on hold time    Supine passive knee extension with heel prop, bolster parallel to R LE to prevent ER at hip and 1x5#AW and 1x3# ankle weight above and below knee respectively x 3 min  -pt instructed to take deep breathes to allow knee to relax     Quad sets 10 x 5 sec -VC and TC for proper muscle activation.   Extension: 14* from full extension Flexion: 104*   SAQ: 10 x 5 sec holds -cues for hold times and TC for target to achieve greater knee extension     Manual Therapy Gentle overpressure at terminal knee extension with towel roll under heel. X 3 min  Gentle passive knee flexion to end range with gentle end range overpressure x 3 min Gentle patellar mobilizations x 2 min (superior and inferior)     Pt ended session with cryotherapy for pain modulation; 6 minutes (unbilled).     Pt educated throughout session about proper posture and technique with exercises. Improved exercise technique, movement at target joints, use of target muscles after min to mod verbal, visual, tactile cues.    Pt demonstrates good motivation throughout session. She does report general fatigue upon arrival due to spending the day in the ER with her sister yesterday. She did ask for occasional rest breaks, especially after exercises encouraging increased knee extension - due to pain > muscular fatigue. Pt is slowly progressing in ROM measurements, today flexion was measured at 104* and extension 14* from neutral. During warm-up, she was able to maintain an elevated SPM compared to her usual cadence. Pt requested cryotherapy at end of session for pain modulation. Pt will continue to benefit from skilled PT intervention in order to improve right LE strengthen ROM, function, improve ambulatory capacity and safety with ADLs.           PT Short Term Goals - 04/02/21 1332       PT SHORT TERM GOAL #1   Title Pt will demonstrate independence with HEP to improve R LE function for increased ability to  participate with ADLs    Baseline HEP given    Time 4    Period Weeks    Status New    Target Date 04/30/21               PT Long Term Goals - 04/02/21 1333       PT LONG TERM GOAL #1   Title Patient will increase FOTO score to 49 to demonstrate predicted increase in functional mobility to complete ADLs    Baseline 04/02/21: 1    Time  12    Period Weeks    Status New    Target Date 06/25/21      PT LONG TERM GOAL #2   Title Pt will perform 5XSTS in 12 seconds in order to demonstrate improved LE strength and decrease the likelihood of falling.    Baseline 04/02/21: 25    Time 12    Period Weeks    Status New    Target Date 06/25/21      PT LONG TERM GOAL #3   Title Pt will actively demonstrate 0 deg R knee extension and 120 deg R knee flexion in order to safely negotiate steps and normalize gait without AD.    Baseline 04/02/21 R knee: flex 80 deg, ext: 50    Time 12    Period Weeks    Status New    Target Date 06/25/21      PT LONG TERM GOAL #4   Title Pt will demonstrate SLS of RLE of at least 10sec to demonstrate decreased fall risk and PLOF of contralateral LE    Baseline 04/02/21 LLE 10sec RLE unable    Time 8    Period Weeks    Status New                   Plan - 05/01/21 1255     Clinical Impression Statement Pt demonstrates good motivation throughout session. She does report general fatigue upon arrival due to spending the day in the ER with her sister yesterday. She did ask for occasional rest breaks, especially after exercises encouraging increased knee extension - due to pain > muscular fatigue. Pt is slowly progressing in ROM measurements, today flexion was measured at 104* and extension 14* from neutral. During warm-up, she was able to maintain an elevated SPM compared to her usual cadence. Pt requested cryotherapy at end of session for pain modulation. Pt will continue to benefit from skilled PT intervention in order to improve right LE strengthen  ROM, function, improve ambulatory capacity and safety with ADLs.    Personal Factors and Comorbidities Age;Comorbidity 2    Comorbidities DM, anxiety, depression    Examination-Activity Limitations Bed Mobility;Carry;Lift;Locomotion Level;Squat;Sit    Examination-Participation Restrictions Church;Cleaning;Community Activity;Laundry;Driving    Stability/Clinical Decision Making Evolving/Moderate complexity    Rehab Potential Good    PT Frequency 2x / week    PT Duration 12 weeks    PT Treatment/Interventions ADLs/Self Care Home Management;Gait training;Stair training;Functional mobility training;Therapeutic activities;Therapeutic exercise;Balance training;Patient/family education;Passive range of motion;Cryotherapy;Aquatic Therapy;Neuromuscular re-education;Manual techniques;Dry needling;Energy conservation;Scar mobilization;Electrical Stimulation    PT Next Visit Plan stationary bike; girth measurements    PT Home Exercise Plan Heel elevation, seated heel slides             Patient will benefit from skilled therapeutic intervention in order to improve the following deficits and impairments:  Abnormal gait, Decreased activity tolerance, Decreased balance, Decreased endurance, Decreased knowledge of use of DME, Decreased mobility, Decreased strength, Decreased range of motion, Hypomobility, Increased muscle spasms, Impaired flexibility, Decreased skin integrity, Difficulty walking, Improper body mechanics, Pain  Visit Diagnosis: Abnormality of gait and mobility  Other abnormalities of gait and mobility  Difficulty in walking, not elsewhere classified  Other lack of coordination  Muscle weakness (generalized)  Unsteadiness on feet     Problem List Patient Active Problem List   Diagnosis Date Noted   OA (osteoarthritis) of knee 03/26/2021   Primary osteoarthritis of right knee 03/26/2021   Leukocytosis 04/19/2020   Thyroid cyst  02/26/2018   Plantar fasciitis 02/19/2016    Family history of breast cancer in sister 03/10/2015   Breast cancer of lower-outer quadrant of right female breast ([place]) 02/10/2015   Abdominal bloating 12/16/2013   Edema 11/09/2013   Urge incontinence 05/05/2013   Atrophic vaginitis 05/05/2013   Syncope 02/15/2013   Irritable bowel syndrome 08/20/2012   Hearing loss 11/07/2011   Generalized anxiety disorder 11/07/2011   Peripheral neuropathy 07/05/2011   Osteoporosis 07/05/2011   Tinnitus, bilateral 04/05/2011   HYPERCHOLESTEROLEMIA 04/05/2010   ANEMIA, IRON DEFICIENCY 04/03/2010   DEPRESSION, MAJOR, MODERATE 04/03/2010   Essential hypertension 04/03/2010   ALLERGIC RHINITIS 04/03/2010   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/03/2010   POSTMENOPAUSAL SYNDROME 04/03/2010   DEGENERATIVE JOINT DISEASE, CERVICAL SPINE 04/03/2010   FIBROMYALGIA 04/03/2010    Patrina Levering PT, DPT  [place] [facility] MAIN [facility] SERVICES [address] [place], [place], [address] Phone: [phone]   Fax:  [phone]  Name: Shannon Hancock MRN: [id number] Date of Birth: Jan 15, 1951

## 2021-05-03 ENCOUNTER — Encounter: Payer: Self-pay | Admitting: Physical Therapy

## 2021-05-03 ENCOUNTER — Ambulatory Visit: Payer: Medicare PPO | Admitting: Physical Therapy

## 2021-05-03 ENCOUNTER — Other Ambulatory Visit: Payer: Self-pay

## 2021-05-03 ENCOUNTER — Encounter: Payer: Medicare PPO | Admitting: Physical Therapy

## 2021-05-03 DIAGNOSIS — R2681 Unsteadiness on feet: Secondary | ICD-10-CM | POA: Diagnosis not present

## 2021-05-03 DIAGNOSIS — R269 Unspecified abnormalities of gait and mobility: Secondary | ICD-10-CM | POA: Diagnosis not present

## 2021-05-03 DIAGNOSIS — R2689 Other abnormalities of gait and mobility: Secondary | ICD-10-CM | POA: Diagnosis not present

## 2021-05-03 DIAGNOSIS — Z96651 Presence of right artificial knee joint: Secondary | ICD-10-CM | POA: Diagnosis not present

## 2021-05-03 DIAGNOSIS — R278 Other lack of coordination: Secondary | ICD-10-CM

## 2021-05-03 DIAGNOSIS — R262 Difficulty in walking, not elsewhere classified: Secondary | ICD-10-CM | POA: Diagnosis not present

## 2021-05-03 DIAGNOSIS — M6281 Muscle weakness (generalized): Secondary | ICD-10-CM

## 2021-05-03 NOTE — Therapy (Signed)
[place] MAIN [facility] SERVICES [address] [place], [place], [address] Phone: [phone]   Fax:  [phone]  Physical Therapy Treatment/Physical Therapy Progress Note   Dates of reporting period  04/02/21   to   05/03/21   Patient Details  Name: Shannon Hancock MRN: [id number] Date of Birth: 1950-07-22 Referring Provider (PT): [name] (Sports medicine @ [facility] [place])   Encounter Date: 05/03/2021   PT End of Session - 05/03/21 1324     Visit Number 10    Number of Visits 17    Date for PT Re-Evaluation 06/01/21    Authorization Type Auth through 12/2; progress note on 11/3    Authorization - Visit Number 7    Authorization - Number of Visits 10    PT Start Time 1100    PT Stop Time 1150    PT Time Calculation (min) 50 min    Activity Tolerance Patient tolerated treatment well;Patient limited by pain    Behavior During Therapy [facility] for tasks assessed/performed             Past Medical History:  Diagnosis Date   Allergy    Anxiety    Breast cancer ([place]) 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; ER/PR+, Her2-, 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%   Depression    Diabetes mellitus without complication ([place])    Diverticulosis    Fibromyalgia    GERD (gastroesophageal reflux disease)    Heart murmur    "no murmur" documented in PCP note by [name], MD 02/09/21   High cholesterol    Hypertension    Joint pain     Past Surgical History:  Procedure Laterality Date   BREAST LUMPECTOMY Right 2016   BREAST LUMPECTOMY WITH RADIOACTIVE SEED AND SENTINEL LYMPH NODE BIOPSY Right 02/13/2015   Procedure: RIGHT BREAST LUMPECTOMY WITH RADIOACTIVE SEED AND SENTINEL LYMPH NODE MAPPING;  Surgeon: [name], MD;  Location: [place];  Service: General;  Laterality: Right;   FOOT SURGERY Left    TOTAL KNEE ARTHROPLASTY Right 03/26/2021   Procedure: TOTAL KNEE ARTHROPLASTY;  Surgeon: [name], MD;  [facility];  Service:  Orthopedics;  Laterality: Right;   TUBAL LIGATION      There were no vitals filed for this visit.   Subjective Assessment - 05/03/21 1110     Subjective Pt reports she feels like she was "hit by a trunk" due to all the family circumstances she has had recently. She reports 1/10 pain in R knee, describes it as an irritating pain. Pt states orthopedic surgeon had "good things to say" at follow up appointment on Tuesday.    Pertinent History Shannon Hancock is a 70 y.o. female presenting to physical therapy s/p R TKA 03/26/21. Pt reports to therapy amb with RW. Pt resides in a two story home with six steps to enter from the front and a ramp in the back. She reports negotiating stairs with assistance from her husband. She reports being able to negotiate and live on the main story of her home. Pt reports she would like to increase her R knee mobility, strength, and decrease her pain in order to walk without AD and go bowling. PMH includes breast cancer, fibromyalgia, DM, HTN, anxiety, depression, and hearing loss. Pt denies any unexplained weight fluctuation, saddle paresthesia, unrelenting night pain, or falls in the last 6 months.    Limitations Lifting;Walking;Standing;House hold activities    How long can you sit comfortably? 5-10  min    How long can you stand comfortably? 5 min    How long can you walk comfortably? 5-8 minutes    Currently in Pain? Yes    Pain Score 1     Pain Location Knee    Pain Orientation Right                PROGRESS NOTE HEP - pt would like to review next session FOTO (goal 49) - 29  5xSTS (goal 12 seconds): 11 seconds ROM (passive): F- 114*, E- 14* from 0 SLS (goal 10 seconds): L 15 seconds, R 11 seconds   Therex:  Nustep, level 2-3 x 5 min with seat level 8 for AAROM of right knee (utilization of UE).    LAQ with 3# AW, OP applied by PT for both extension and flexion x 10 each direction with 5 sec holds   Supine SLR with 3# AW, VC to hover RLE to  maintain muscle tension.   Supine stretches: HS stretch with DF OP, 2 x 60 sec Single knee to chest stretch, 2 x 60 seconds with OP for increased knee flexion.   Supine passive knee extension with heel prop, 1x5#AW and 1x3# ankle weight above and below knee respectively x 3 min  -pt instructed to take deep breathes to allow knee to relax   -pt actively self-corrected ER at hip   Extension: 12* from full extension Flexion: 114*   Pt ended session with cryotherapy for pain modulation; 5 minutes while completing FOTO assessment.      Pt educated throughout session about proper posture and technique with exercises.     Clinical Impression: Pt demonstrates excellent motivation and participation throughout today's session. Goals were assessed due to today being a progress note. She has met 2 of her 5 goals including 5xSTS and single leg stance of 10 seconds. She has made progress towards the other goals indicating improved ROM and overall function. Remainder of session was progressed by adding ankle weights to active exercises and increasing the resistance on the NuStep as pt maintained SPM >70. Pt has demonstrated steady progress in PT however she has yet to reach her full potential. Pt will continue to benefit from skilled PT intervention in order to improve right LE strength, ROM, function, ambulatory capacity and safety with ADLs.   Patient's condition has the potential to improve in response to therapy. Maximum improvement is yet to be obtained. The anticipated improvement is attainable and reasonable in a generally predictable time.        PT Short Term Goals - 05/03/21 1346       PT SHORT TERM GOAL #1   Title Pt will demonstrate independence with HEP to improve R LE function for increased ability to participate with ADLs    Baseline 11/3: pt asking for review of HEP    Time 4    Period Weeks    Status Partially Met    Target Date 04/30/21               PT Long Term Goals  - 05/03/21 1347       PT LONG TERM GOAL #1   Title Patient will increase FOTO score to 49 to demonstrate predicted increase in functional mobility to complete ADLs    Baseline 04/02/21: 1; 11/3: 29    Time 12    Period Weeks    Status On-going      PT LONG TERM GOAL #2   Title Pt will  perform 5XSTS in 12 seconds in order to demonstrate improved LE strength and decrease the likelihood of falling.    Baseline 04/02/21: 25; 11/3: 11 seconds    Time 12    Period Weeks    Status Achieved    Target Date 06/25/21      PT LONG TERM GOAL #3   Title Pt will actively demonstrate 0 deg R knee extension and 120 deg R knee flexion in order to safely negotiate steps and normalize gait without AD.    Baseline 04/02/21 R knee: flex 80 deg, ext: 50; 11/3 (PROM): flex 114*, ext 12*    Time 12    Period Weeks    Status On-going    Target Date 06/25/21      PT LONG TERM GOAL #4   Title Pt will demonstrate SLS of RLE of at least 10sec to demonstrate decreased fall risk and PLOF of contralateral LE    Baseline 04/02/21 LLE 10sec RLE unable; 11/3: LLE 15 sec, RLE 11 sec    Time 8    Period Weeks    Status Achieved                   Plan - 05/03/21 1325     Clinical Impression Statement Pt demonstrates excellent motivation and participation throughout today's session. Goals were assessed due to today being a progress note. She has met 2 of her 5 goals including 5xSTS and single leg stance of 10 seconds. She has made progress towards the other goals indicating improved ROM and overall function. Remainder of session was progressed by adding ankle weights to active exercises and increasing the resistance on the NuStep as pt maintained SPM >70. Pt has demonstrated steady progress in PT however she has yet to reach her full potential. Pt will continue to benefit from skilled PT intervention in order to improve right LE strength, ROM, function, ambulatory capacity and safety with ADLs.     Patient's  condition has the potential to improve in response to therapy. Maximum improvement is yet to be obtained. The anticipated improvement is attainable and reasonable in a generally predictable time.    Personal Factors and Comorbidities Age;Comorbidity 2    Comorbidities DM, anxiety, depression    Examination-Activity Limitations Bed Mobility;Carry;Lift;Locomotion Level;Squat;Sit    Examination-Participation Restrictions Church;Cleaning;Community Activity;Laundry;Driving    Stability/Clinical Decision Making Evolving/Moderate complexity    Rehab Potential Good    PT Frequency 2x / week    PT Duration 12 weeks    PT Treatment/Interventions ADLs/Self Care Home Management;Gait training;Stair training;Functional mobility training;Therapeutic activities;Therapeutic exercise;Balance training;Patient/family education;Passive range of motion;Cryotherapy;Aquatic Therapy;Neuromuscular re-education;Manual techniques;Dry needling;Energy conservation;Scar mobilization;Electrical Stimulation    PT Next Visit Plan stationary bike; girth measurements    PT Home Exercise Plan Heel elevation, seated heel slides             Patient will benefit from skilled therapeutic intervention in order to improve the following deficits and impairments:  Abnormal gait, Decreased activity tolerance, Decreased balance, Decreased endurance, Decreased knowledge of use of DME, Decreased mobility, Decreased strength, Decreased range of motion, Hypomobility, Increased muscle spasms, Impaired flexibility, Decreased skin integrity, Difficulty walking, Improper body mechanics, Pain  Visit Diagnosis: Abnormality of gait and mobility  Other lack of coordination  Difficulty in walking, not elsewhere classified  Unsteadiness on feet  Muscle weakness (generalized)  Other abnormalities of gait and mobility     Problem List Patient Active Problem List   Diagnosis Date Noted   OA (osteoarthritis) of  knee 03/26/2021   Primary  osteoarthritis of right knee 03/26/2021   Leukocytosis 04/19/2020   Thyroid cyst 02/26/2018   Plantar fasciitis 02/19/2016   Family history of breast cancer in sister 03/10/2015   Breast cancer of lower-outer quadrant of right female breast ([place]) 02/10/2015   Abdominal bloating 12/16/2013   Edema 11/09/2013   Urge incontinence 05/05/2013   Atrophic vaginitis 05/05/2013   Syncope 02/15/2013   Irritable bowel syndrome 08/20/2012   Hearing loss 11/07/2011   Generalized anxiety disorder 11/07/2011   Peripheral neuropathy 07/05/2011   Osteoporosis 07/05/2011   Tinnitus, bilateral 04/05/2011   HYPERCHOLESTEROLEMIA 04/05/2010   ANEMIA, IRON DEFICIENCY 04/03/2010   DEPRESSION, MAJOR, MODERATE 04/03/2010   Essential hypertension 04/03/2010   ALLERGIC RHINITIS 04/03/2010   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/03/2010   POSTMENOPAUSAL SYNDROME 04/03/2010   DEGENERATIVE JOINT DISEASE, CERVICAL SPINE 04/03/2010   FIBROMYALGIA 04/03/2010     Patrina Levering PT, DPT  [place] [facility] MAIN [facility] SERVICES [address] [place], [place], [address] Phone: [phone]   Fax:  [phone]  Name: Shannon Hancock MRN: [id number] Date of Birth: 1951/04/21

## 2021-05-04 DIAGNOSIS — Z9189 Other specified personal risk factors, not elsewhere classified: Secondary | ICD-10-CM | POA: Diagnosis not present

## 2021-05-04 DIAGNOSIS — Z853 Personal history of malignant neoplasm of breast: Secondary | ICD-10-CM | POA: Diagnosis not present

## 2021-05-07 ENCOUNTER — Encounter: Payer: Medicare PPO | Admitting: Occupational Therapy

## 2021-05-08 ENCOUNTER — Encounter: Payer: Medicare PPO | Admitting: Physical Therapy

## 2021-05-08 ENCOUNTER — Other Ambulatory Visit: Payer: Self-pay

## 2021-05-08 ENCOUNTER — Encounter: Payer: Self-pay | Admitting: Physical Therapy

## 2021-05-08 ENCOUNTER — Ambulatory Visit: Payer: Medicare PPO | Admitting: Physical Therapy

## 2021-05-08 DIAGNOSIS — R2681 Unsteadiness on feet: Secondary | ICD-10-CM | POA: Diagnosis not present

## 2021-05-08 DIAGNOSIS — R262 Difficulty in walking, not elsewhere classified: Secondary | ICD-10-CM

## 2021-05-08 DIAGNOSIS — R2689 Other abnormalities of gait and mobility: Secondary | ICD-10-CM | POA: Diagnosis not present

## 2021-05-08 DIAGNOSIS — R278 Other lack of coordination: Secondary | ICD-10-CM | POA: Diagnosis not present

## 2021-05-08 DIAGNOSIS — Z96651 Presence of right artificial knee joint: Secondary | ICD-10-CM | POA: Diagnosis not present

## 2021-05-08 DIAGNOSIS — R269 Unspecified abnormalities of gait and mobility: Secondary | ICD-10-CM

## 2021-05-08 DIAGNOSIS — M6281 Muscle weakness (generalized): Secondary | ICD-10-CM | POA: Diagnosis not present

## 2021-05-08 NOTE — Therapy (Signed)
[place] MAIN [facility] SERVICES [address] [place], [place], [address] Phone: [phone]   Fax:  [phone]  Physical Therapy Treatment  Patient Details  Name: Shannon Hancock MRN: [id number] Date of Birth: 02/16/1951 Referring Provider (PT): [name] (Sports medicine @ [facility] [place])   Encounter Date: 05/08/2021   PT End of Session - 05/08/21 1123     Visit Number 11    Number of Visits 17    Date for PT Re-Evaluation 06/01/21    Authorization Type Auth through 12/2; progress note on 11/3    Authorization - Visit Number 7    Authorization - Number of Visits 10    PT Start Time 1104    PT Stop Time 1145    PT Time Calculation (min) 41 min    Equipment Utilized During Treatment Gait belt    Activity Tolerance Patient tolerated treatment well;Patient limited by pain    Behavior During Therapy [facility] for tasks assessed/performed             Past Medical History:  Diagnosis Date   Allergy    Anxiety    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; ER/PR+, Her2-, 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%   Depression    Diabetes mellitus without complication ([place])    Diverticulosis    Fibromyalgia    GERD (gastroesophageal reflux disease)    Heart murmur    "no murmur" documented in PCP note by [name], MD 02/09/21   High cholesterol    Hypertension    Joint pain     Past Surgical History:  Procedure Laterality Date   BREAST LUMPECTOMY Right 2016   BREAST LUMPECTOMY WITH RADIOACTIVE SEED AND SENTINEL LYMPH NODE BIOPSY Right 02/13/2015   Procedure: RIGHT BREAST LUMPECTOMY WITH RADIOACTIVE SEED AND SENTINEL LYMPH NODE MAPPING;  Surgeon: [name], MD;  Location: [place];  Service: General;  Laterality: Right;   FOOT SURGERY Left    TOTAL KNEE ARTHROPLASTY Right 03/26/2021   Procedure: TOTAL KNEE ARTHROPLASTY;  Surgeon: [name], MD;  Location: [facility];  Service: Orthopedics;  Laterality: Right;   TUBAL  LIGATION      There were no vitals filed for this visit.   Subjective Assessment - 05/08/21 1117     Subjective Pt states she is doing well today. Denies pain at rest however does endorse pain with weight-bearing. States that the pain felt is in the superior region of the knee. No questions at this time.    Pertinent History Shannon Hancock is a 70 y.o. female presenting to physical therapy s/p R TKA 03/26/21. Pt reports to therapy amb with RW. Pt resides in a two story home with six steps to enter from the front and a ramp in the back. She reports negotiating stairs with assistance from her husband. She reports being able to negotiate and live on the main story of her home. Pt reports she would like to increase her R knee mobility, strength, and decrease her pain in order to walk without AD and go bowling. PMH includes breast cancer, fibromyalgia, DM, HTN, anxiety, depression, and hearing loss. Pt denies any unexplained weight fluctuation, saddle paresthesia, unrelenting night pain, or falls in the last 6 months.    Limitations Lifting;Walking;Standing;House hold activities    How long can you sit comfortably? 5-10 min    How long can you stand comfortably? 5 min    How long can you walk comfortably? 5-8 minutes  Currently in Pain? No/denies                Therex:  Nustep, LE only, level 1 x 6 min with seat level 8 for AAROM of right knee (utilization of UE)   Quad sets, 10 x 5 sec    HS stretch (supine)  2 x 30 sec   Gastroc stretch (supine)  -2 x 30 sec   Supine passive knee extension with heel prop, bolster parallel to R LE to prevent ER at hip and 1x5#AW and 1x3# ankle weight above and below knee respectively x 3 min  -pt instructed to take deep breathes to allow knee to relax      Supine SLR with 3# AW, 2x10 reps RLE only.  LAQ, 2 x 10 RLE with 5 sec holds;   Extension: 10* from full extension Flexion: 116*     Manual Therapy PROM knee flexion to end range with  overpressure x 3 min Gentle patellar mobilizations x 3 min (superior, inferior, lateral) Scar mobilization x3 minutes with education and pt self-mobilization x2 minutes for practice and understanding.      Pt educated throughout session about proper posture and technique with exercises. Improved exercise technique, movement at target joints, use of target muscles after min to mod verbal, visual, tactile cues.       Pt demonstrates good motivation throughout session. Therapeutic exercises were progressed with ankle weights while maintaining 5 second isometric hold and adding a second set. PT provided stretching in supine to provide greater overpressure with knee extension. Scar mobilization was initiated with PT educating pt on self-scar mobilization and pt practicing within session. Pt ROM continues to progress, today flexion was measured at 116* and extension 10* from neutral. Pt will continue to benefit from skilled PT intervention in order to improve right LE strengthen ROM, function, improve ambulatory capacity and safety with ADLs.            PT Short Term Goals - 05/03/21 1346       PT SHORT TERM GOAL #1   Title Pt will demonstrate independence with HEP to improve R LE function for increased ability to participate with ADLs    Baseline 11/3: pt asking for review of HEP    Time 4    Period Weeks    Status Partially Met    Target Date 04/30/21               PT Long Term Goals - 05/03/21 1347       PT LONG TERM GOAL #1   Title Patient will increase FOTO score to 49 to demonstrate predicted increase in functional mobility to complete ADLs    Baseline 04/02/21: 1; 11/3: 29    Time 12    Period Weeks    Status On-going      PT LONG TERM GOAL #2   Title Pt will perform 5XSTS in 12 seconds in order to demonstrate improved LE strength and decrease the likelihood of falling.    Baseline 04/02/21: 25; 11/3: 11 seconds    Time 12    Period Weeks    Status Achieved     Target Date 06/25/21      PT LONG TERM GOAL #3   Title Pt will actively demonstrate 0 deg R knee extension and 120 deg R knee flexion in order to safely negotiate steps and normalize gait without AD.    Baseline 04/02/21 R knee: flex 80 deg, ext: 50; 11/3 (PROM): flex  114*, ext 12*    Time 12    Period Weeks    Status On-going    Target Date 06/25/21      PT LONG TERM GOAL #4   Title Pt will demonstrate SLS of RLE of at least 10sec to demonstrate decreased fall risk and PLOF of contralateral LE    Baseline 04/02/21 LLE 10sec RLE unable; 11/3: LLE 15 sec, RLE 11 sec    Time 8    Period Weeks    Status Achieved                   Plan - 05/08/21 1203     Clinical Impression Statement Pt demonstrates good motivation throughout session. Therapeutic exercises were progressed with ankle weights while maintaining 5 second isometric hold and adding a second set. PT provided stretching in supine to provide greater overpressure with knee extension. Scar mobilization was initiated with PT educating pt on self-scar mobilization and pt practicing within session. Pt ROM continues to progress, today flexion was measured at 116* and extension 10* from neutral. Pt will continue to benefit from skilled PT intervention in order to improve right LE strengthen ROM, function, improve ambulatory capacity and safety with ADLs.    Personal Factors and Comorbidities Age;Comorbidity 2    Comorbidities DM, anxiety, depression    Examination-Activity Limitations Bed Mobility;Carry;Lift;Locomotion Level;Squat;Sit    Examination-Participation Restrictions Church;Cleaning;Community Activity;Laundry;Driving    Stability/Clinical Decision Making Evolving/Moderate complexity    Rehab Potential Good    PT Frequency 2x / week    PT Duration 12 weeks    PT Treatment/Interventions ADLs/Self Care Home Management;Gait training;Stair training;Functional mobility training;Therapeutic activities;Therapeutic exercise;Balance  training;Patient/family education;Passive range of motion;Cryotherapy;Aquatic Therapy;Neuromuscular re-education;Manual techniques;Dry needling;Energy conservation;Scar mobilization;Electrical Stimulation    PT Next Visit Plan stationary bike; girth measurements    PT Home Exercise Plan Heel elevation, seated heel slides             Patient will benefit from skilled therapeutic intervention in order to improve the following deficits and impairments:  Abnormal gait, Decreased activity tolerance, Decreased balance, Decreased endurance, Decreased knowledge of use of DME, Decreased mobility, Decreased strength, Decreased range of motion, Hypomobility, Increased muscle spasms, Impaired flexibility, Decreased skin integrity, Difficulty walking, Improper body mechanics, Pain  Visit Diagnosis: Abnormality of gait and mobility  Other lack of coordination  Difficulty in walking, not elsewhere classified  Muscle weakness (generalized)  Other abnormalities of gait and mobility  Unsteadiness on feet     Problem List Patient Active Problem List   Diagnosis Date Noted   OA (osteoarthritis) of knee 03/26/2021   Primary osteoarthritis of right knee 03/26/2021   Leukocytosis 04/19/2020   Thyroid cyst 02/26/2018   Plantar fasciitis 02/19/2016   Family history of breast cancer in sister 03/10/2015   Breast cancer of lower-outer quadrant of right female breast ([place]) 02/10/2015   Abdominal bloating 12/16/2013   Edema 11/09/2013   Urge incontinence 05/05/2013   Atrophic vaginitis 05/05/2013   Syncope 02/15/2013   Irritable bowel syndrome 08/20/2012   Hearing loss 11/07/2011   Generalized anxiety disorder 11/07/2011   Peripheral neuropathy 07/05/2011   Osteoporosis 07/05/2011   Tinnitus, bilateral 04/05/2011   HYPERCHOLESTEROLEMIA 04/05/2010   ANEMIA, IRON DEFICIENCY 04/03/2010   DEPRESSION, MAJOR, MODERATE 04/03/2010   Essential hypertension 04/03/2010   ALLERGIC RHINITIS 04/03/2010    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/03/2010   POSTMENOPAUSAL SYNDROME 04/03/2010   DEGENERATIVE JOINT DISEASE, CERVICAL SPINE 04/03/2010   FIBROMYALGIA 04/03/2010    Shannon Hancock  Barnie Del, DPT  [place] MAIN [facility] SERVICES [address] [place], [place], [address] Phone: [phone]   Fax:  [phone]  Name: Shannon Hancock MRN: [id number] Date of Birth: [birth year]

## 2021-05-10 ENCOUNTER — Ambulatory Visit: Payer: Medicare PPO

## 2021-05-10 ENCOUNTER — Encounter: Payer: Medicare PPO | Admitting: Physical Therapy

## 2021-05-10 ENCOUNTER — Other Ambulatory Visit: Payer: Self-pay

## 2021-05-10 DIAGNOSIS — R2689 Other abnormalities of gait and mobility: Secondary | ICD-10-CM | POA: Diagnosis not present

## 2021-05-10 DIAGNOSIS — R262 Difficulty in walking, not elsewhere classified: Secondary | ICD-10-CM | POA: Diagnosis not present

## 2021-05-10 DIAGNOSIS — R2681 Unsteadiness on feet: Secondary | ICD-10-CM | POA: Diagnosis not present

## 2021-05-10 DIAGNOSIS — R269 Unspecified abnormalities of gait and mobility: Secondary | ICD-10-CM | POA: Diagnosis not present

## 2021-05-10 DIAGNOSIS — R278 Other lack of coordination: Secondary | ICD-10-CM | POA: Diagnosis not present

## 2021-05-10 DIAGNOSIS — M6281 Muscle weakness (generalized): Secondary | ICD-10-CM | POA: Diagnosis not present

## 2021-05-10 DIAGNOSIS — Z96651 Presence of right artificial knee joint: Secondary | ICD-10-CM | POA: Diagnosis not present

## 2021-05-10 NOTE — Therapy (Signed)
[place] MAIN [facility] SERVICES [address] [place], [place], [address] Phone: [phone]   Fax:  [phone]  Physical Therapy Treatment  Patient Details  Name: Shannon Hancock MRN: [id number] Date of Birth: [birth year] Referring Provider (PT): [name] (Sports medicine @ [facility] [place])   Encounter Date: 05/10/2021   PT End of Session - 05/10/21 1540     Visit Number 12    Number of Visits 17    Date for PT Re-Evaluation 06/01/21    Authorization Type Auth through 12/2; progress note on 11/3    Authorization - Visit Number 7    Authorization - Number of Visits 10    PT Start Time 3094    PT Stop Time 1500    PT Time Calculation (min) 57 min    Equipment Utilized During Treatment Gait belt    Activity Tolerance Patient tolerated treatment well;Patient limited by pain    Behavior During Therapy [facility] for tasks assessed/performed             Past Medical History:  Diagnosis Date   Allergy    Anxiety    Breast cancer ([place]) 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; ER/PR+, Her2-, 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%   Depression    Diabetes mellitus without complication ([facility])    Diverticulosis    Fibromyalgia    GERD (gastroesophageal reflux disease)    Heart murmur    "no murmur" documented in PCP note by [name], MD 02/09/21   High cholesterol    Hypertension    Joint pain     Past Surgical History:  Procedure Laterality Date   BREAST LUMPECTOMY Right 2016   BREAST LUMPECTOMY WITH RADIOACTIVE SEED AND SENTINEL LYMPH NODE BIOPSY Right 02/13/2015   Procedure: RIGHT BREAST LUMPECTOMY WITH RADIOACTIVE SEED AND SENTINEL LYMPH NODE MAPPING;  Surgeon: [name], MD;  Location: [place];  Service: General;  Laterality: Right;   FOOT SURGERY Left    TOTAL KNEE ARTHROPLASTY Right 03/26/2021   Procedure: TOTAL KNEE ARTHROPLASTY;  Surgeon: [name], MD;  Location[facility];  Service: Orthopedics;  Laterality: Right;   TUBAL  LIGATION      There were no vitals filed for this visit.   Subjective Assessment - 05/10/21 1358     Subjective Patient reports doing okay today. States no pain at rest and reports concerned about the increased bend of her knee in standing/walking.    Pertinent History Shannon Hancock is a 70 y.o. female presenting to physical therapy s/p R TKA 03/26/21. Pt reports to therapy amb with RW. Pt resides in a two story home with six steps to enter from the front and a ramp in the back. She reports negotiating stairs with assistance from her husband. She reports being able to negotiate and live on the main story of her home. Pt reports she would like to increase her R knee mobility, strength, and decrease her pain in order to walk without AD and go bowling. PMH includes breast cancer, fibromyalgia, DM, HTN, anxiety, depression, and hearing loss. Pt denies any unexplained weight fluctuation, saddle paresthesia, unrelenting night pain, or falls in the last 6 months.    Limitations Lifting;Walking;Standing;House hold activities    How long can you sit comfortably? 5-10 min    How long can you stand comfortably? 5 min    How long can you walk comfortably? 5-8 minutes    Currently in Pain? No/denies  Interventions:   Pre- therapy ROM: Right knee = 13 deg to 105 deg AROM  Therex:   Patient was instructed in standing LE strengthening:  Standing hip march Standing hip abd Standing hip ext Standing knee flex Standing minisquats Standing Calf raises Standing toe raises  Instruction in seated knee flex stretch x 20 sec x 4 sets as well as seated knee flex stretch- Education provided throughout session via VC/TC and demonstration to facilitate movement at target joints and correct muscle activation for all testing and exercises performed.   Access Code: [email or web address] URL: [email or web address] Date: 05/10/2021 Prepared by: Sande Brothers  Exercises Seated  Hamstring Stretch - 3 x daily - 7 x weekly - 3 sets - 20 sec hold Seated Knee Flexion Stretch - 3 x daily - 7 x weekly - 3 sets - 20 sec hold Standing March with Counter Support - 1 x daily - 7 x weekly - 3 sets - 10 reps - 2 sec hold Standing Hip Abduction with Counter Support - 1 x daily - 7 x weekly - 3 sets - 10 reps - 2 sec hold Standing Hip Extension with Counter Support - 1 x daily - 7 x weekly - 3 sets - 10 reps - 2 sec hold Heel Raises with Counter Support - 1 x daily - 7 x weekly - 3 sets - 10 reps - 2 sec hold Mini Squat with Counter Support - 1 x daily - 7 x weekly - 3 sets - 10 reps - 2 sec hold Standing Knee Flexion - 1 x daily - 7 x weekly - 3 sets - 10 reps - 2 sec hold        Manual Therapy PROM knee flexion to end range with overpressure x 15 min Gentle PA and AP tibiofemoral joint glides x 30 bouts x 3 sets each     After PT session Extension: 8 deg from full extension Flexion: 118 deg Cryotherapy in supine to right knee x 10 min (unbilled) with leg supported by wedge for proper elevation.         Clinical Impression: Pt demonstrates good motivation with new activities today. Therapeutic exercises were progressed to more functional standing exercises.  PT provided stretching and strengthening exercises and issued an HEP without incidence. Patient continues to progress with ROM yet still limited with knee extension as evident with walking. Pt will continue to benefit from skilled PT intervention in order to improve right LE strengthen ROM, function, improve ambulatory capacity and safety with ADLs                        PT Education - 05/10/21 1540     Education Details Exercise technique    Person(s) Educated Patient    Methods Explanation;Demonstration;Tactile cues;Verbal cues    Comprehension Verbalized understanding;Returned demonstration;Verbal cues required;Tactile cues required;Need further instruction              PT Short Term  Goals - 05/03/21 1346       PT SHORT TERM GOAL #1   Title Pt will demonstrate independence with HEP to improve R LE function for increased ability to participate with ADLs    Baseline 11/3: pt asking for review of HEP    Time 4    Period Weeks    Status Partially Met    Target Date 04/30/21               PT Long Term Goals - 05/03/21 1347  PT LONG TERM GOAL #1   Title Patient will increase FOTO score to 49 to demonstrate predicted increase in functional mobility to complete ADLs    Baseline 04/02/21: 1; 11/3: 29    Time 12    Period Weeks    Status On-going      PT LONG TERM GOAL #2   Title Pt will perform 5XSTS in 12 seconds in order to demonstrate improved LE strength and decrease the likelihood of falling.    Baseline 04/02/21: 25; 11/3: 11 seconds    Time 12    Period Weeks    Status Achieved    Target Date 06/25/21      PT LONG TERM GOAL #3   Title Pt will actively demonstrate 0 deg R knee extension and 120 deg R knee flexion in order to safely negotiate steps and normalize gait without AD.    Baseline 04/02/21 R knee: flex 80 deg, ext: 50; 11/3 (PROM): flex 114*, ext 12*    Time 12    Period Weeks    Status On-going    Target Date 06/25/21      PT LONG TERM GOAL #4   Title Pt will demonstrate SLS of RLE of at least 10sec to demonstrate decreased fall risk and PLOF of contralateral LE    Baseline 04/02/21 LLE 10sec RLE unable; 11/3: LLE 15 sec, RLE 11 sec    Time 8    Period Weeks    Status Achieved                   Plan - 05/10/21 1540     Clinical Impression Statement Pt demonstrates good motivation with new activities today. Therapeutic exercises were progressed to more functional standing exercises.  PT provided stretching and strengthening exercises and issued an HEP without incidence. Patient continues to progress with ROM yet still limited with knee extension as evident with walking. Pt will continue to benefit from skilled PT intervention  in order to improve right LE strengthen ROM, function, improve ambulatory capacity and safety with ADLs    Personal Factors and Comorbidities Age;Comorbidity 2    Comorbidities DM, anxiety, depression    Examination-Activity Limitations Bed Mobility;Carry;Lift;Locomotion Level;Squat;Sit    Examination-Participation Restrictions Church;Cleaning;Community Activity;Laundry;Driving    Stability/Clinical Decision Making Evolving/Moderate complexity    Rehab Potential Good    PT Frequency 2x / week    PT Duration 12 weeks    PT Treatment/Interventions ADLs/Self Care Home Management;Gait training;Stair training;Functional mobility training;Therapeutic activities;Therapeutic exercise;Balance training;Patient/family education;Passive range of motion;Cryotherapy;Aquatic Therapy;Neuromuscular re-education;Manual techniques;Dry needling;Energy conservation;Scar mobilization;Electrical Stimulation    PT Next Visit Plan Manual Therapy for ROM/Pain relief as appropriate, Progressive LE strengthening and functional mobility training to normalize gait.    PT Home Exercise Plan 05/10/2021-Access Code: [email or web address]  URL: [email or web address]    Consulted and Agree with Plan of Care Patient             Patient will benefit from skilled therapeutic intervention in order to improve the following deficits and impairments:  Abnormal gait, Decreased activity tolerance, Decreased balance, Decreased endurance, Decreased knowledge of use of DME, Decreased mobility, Decreased strength, Decreased range of motion, Hypomobility, Increased muscle spasms, Impaired flexibility, Decreased skin integrity, Difficulty walking, Improper body mechanics, Pain  Visit Diagnosis: Abnormality of gait and mobility  Difficulty in walking, not elsewhere classified  Muscle weakness (generalized)  Other lack of coordination     Problem List Patient Active Problem List   Diagnosis Date Noted   OA (osteoarthritis) of  knee 03/26/2021   Primary osteoarthritis of right knee 03/26/2021   Leukocytosis 04/19/2020   Thyroid cyst 02/26/2018   Plantar fasciitis 02/19/2016   Family history of breast cancer in sister 03/10/2015   Breast cancer of lower-outer quadrant of right female breast ([place]) 02/10/2015   Abdominal bloating 12/16/2013   Edema 11/09/2013   Urge incontinence 05/05/2013   Atrophic vaginitis 05/05/2013   Syncope 02/15/2013   Irritable bowel syndrome 08/20/2012   Hearing loss 11/07/2011   Generalized anxiety disorder 11/07/2011   Peripheral neuropathy 07/05/2011   Osteoporosis 07/05/2011   Tinnitus, bilateral 04/05/2011   HYPERCHOLESTEROLEMIA 04/05/2010   ANEMIA, IRON DEFICIENCY 04/03/2010   DEPRESSION, MAJOR, MODERATE 04/03/2010   Essential hypertension 04/03/2010   ALLERGIC RHINITIS 04/03/2010   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/03/2010   POSTMENOPAUSAL SYNDROME 04/03/2010   DEGENERATIVE JOINT DISEASE, CERVICAL SPINE 04/03/2010   FIBROMYALGIA 04/03/2010    Lewis Moccasin, PT 05/10/2021, 3:43 PM  [place] MAIN [facility] SERVICES [address] [place], [place], [address] Phone: [phone]   Fax:  [phone]  Name: DWIGHT BURDO MRN: [id number] Date of Birth: December 25, 1950

## 2021-05-15 ENCOUNTER — Other Ambulatory Visit: Payer: Self-pay

## 2021-05-15 ENCOUNTER — Encounter: Payer: Medicare PPO | Admitting: Physical Therapy

## 2021-05-15 ENCOUNTER — Ambulatory Visit: Payer: Medicare PPO | Admitting: Physical Therapy

## 2021-05-15 DIAGNOSIS — R278 Other lack of coordination: Secondary | ICD-10-CM | POA: Diagnosis not present

## 2021-05-15 DIAGNOSIS — M6281 Muscle weakness (generalized): Secondary | ICD-10-CM

## 2021-05-15 DIAGNOSIS — Z96651 Presence of right artificial knee joint: Secondary | ICD-10-CM | POA: Diagnosis not present

## 2021-05-15 DIAGNOSIS — R262 Difficulty in walking, not elsewhere classified: Secondary | ICD-10-CM

## 2021-05-15 DIAGNOSIS — R2689 Other abnormalities of gait and mobility: Secondary | ICD-10-CM | POA: Diagnosis not present

## 2021-05-15 DIAGNOSIS — R269 Unspecified abnormalities of gait and mobility: Secondary | ICD-10-CM | POA: Diagnosis not present

## 2021-05-15 DIAGNOSIS — R2681 Unsteadiness on feet: Secondary | ICD-10-CM | POA: Diagnosis not present

## 2021-05-15 NOTE — Therapy (Signed)
[place] MAIN [facility] SERVICES [address] [place], [place], [address] Phone: [phone]   Fax:  [phone]  Physical Therapy Treatment  Patient Details  Name: Shannon Hancock MRN: [id number] Date of Birth: [birth year] Referring Provider (PT): [name] (Sports medicine @ [facility] [place])   Encounter Date: 05/15/2021   PT End of Session - 05/15/21 1241     Visit Number 13    Number of Visits 17    Date for PT Re-Evaluation 06/01/21    Authorization Type Auth through 12/2; progress note on 11/3    Authorization - Visit Number 7    Authorization - Number of Visits 10    PT Start Time 6160    PT Stop Time 1144    PT Time Calculation (min) 46 min    Equipment Utilized During Treatment Gait belt    Activity Tolerance Patient tolerated treatment well;Patient limited by pain    Behavior During Therapy [facility] for tasks assessed/performed             Past Medical History:  Diagnosis Date   Allergy    Anxiety    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; ER/PR+, Her2-, 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%   Depression    Diabetes mellitus without complication (HCC)    Diverticulosis    Fibromyalgia    GERD (gastroesophageal reflux disease)    Heart murmur    "no murmur" documented in PCP note by [name], MD 02/09/21   High cholesterol    Hypertension    Joint pain     Past Surgical History:  Procedure Laterality Date   BREAST LUMPECTOMY Right 2016   BREAST LUMPECTOMY WITH RADIOACTIVE SEED AND SENTINEL LYMPH NODE BIOPSY Right 02/13/2015   Procedure: RIGHT BREAST LUMPECTOMY WITH RADIOACTIVE SEED AND SENTINEL LYMPH NODE MAPPING;  Surgeon: [name], MD;  Location: [place];  Service: General;  Laterality: Right;   FOOT SURGERY Left    TOTAL KNEE ARTHROPLASTY Right 03/26/2021   Procedure: TOTAL KNEE ARTHROPLASTY;  Surgeon: [name], MD;  Location: [facility];  Service: Orthopedics;  Laterality: Right;   TUBAL  LIGATION      There were no vitals filed for this visit.   Subjective Assessment - 05/15/21 1102     Subjective Patient reports doing okay today. Reports min pain earlier today but no pain at this time.    Pertinent History Shannon Hancock is a 70 y.o. female presenting to physical therapy s/p R TKA 03/26/21. Pt reports to therapy amb with RW. Pt resides in a two story home with six steps to enter from the front and a ramp in the back. She reports negotiating stairs with assistance from her husband. She reports being able to negotiate and live on the main story of her home. Pt reports she would like to increase her R knee mobility, strength, and decrease her pain in order to walk without AD and go bowling. PMH includes breast cancer, fibromyalgia, DM, HTN, anxiety, depression, and hearing loss. Pt denies any unexplained weight fluctuation, saddle paresthesia, unrelenting night pain, or falls in the last 6 months.    Limitations Lifting;Walking;Standing;House hold activities    How long can you sit comfortably? 5-10 min    How long can you stand comfortably? 5 min    How long can you walk comfortably? 5-8 minutes           Treatment provided this session  Nustep level 3 80  SPM, x 5 min - no charge   Therex:     Standing hip ext 2 x 10 with RTB    Leg press 1 x 15 @ 40 lb B LE 1 x 10 @ 55 lb B LE   2 x 12 @ 25 L R LE only - Pt reports hard but no pain associated with exercise   SLR with cues for maintaining quad set 1 x 10   QS with towell under heel 1 x 10 x 5 sec with overpressure from PT   There Act: The following activities were completed in parallel bars or at balance bar, utilization of UE for balance and support with all of the below exercises    Sidestepping with RTB around ankles 3 x length of // bars  Step up 2 x 10 anterior, 2x 10 lateral steps ups  Standing squats 2 x 10  Standing Calf raises x 15  STS 2 x 10 cues for equal WB  Standing hip march x 15 ea with  cues for R knee ext  Unless otherwise stated, SBA was provided and gait belt donned in order to ensure pt safety      Pt educated throughout session about proper posture and technique with exercises. Improved exercise technique, movement at target joints, use of target muscles after min to mod verbal, visual, tactile cues.                                 PT Education - 05/15/21 1103     Education Details Exercise technique    Person(s) Educated Patient    Methods Explanation;Demonstration;Tactile cues;Verbal cues    Comprehension Verbalized understanding;Returned demonstration;Verbal cues required;Tactile cues required              PT Short Term Goals - 05/03/21 1346       PT SHORT TERM GOAL #1   Title Pt will demonstrate independence with HEP to improve R LE function for increased ability to participate with ADLs    Baseline 11/3: pt asking for review of HEP    Time 4    Period Weeks    Status Partially Met    Target Date 04/30/21               PT Long Term Goals - 05/03/21 1347       PT LONG TERM GOAL #1   Title Patient will increase FOTO score to 49 to demonstrate predicted increase in functional mobility to complete ADLs    Baseline 04/02/21: 1; 11/3: 29    Time 12    Period Weeks    Status On-going      PT LONG TERM GOAL #2   Title Pt will perform 5XSTS in 12 seconds in order to demonstrate improved LE strength and decrease the likelihood of falling.    Baseline 04/02/21: 25; 11/3: 11 seconds    Time 12    Period Weeks    Status Achieved    Target Date 06/25/21      PT LONG TERM GOAL #3   Title Pt will actively demonstrate 0 deg R knee extension and 120 deg R knee flexion in order to safely negotiate steps and normalize gait without AD.    Baseline 04/02/21 R knee: flex 80 deg, ext: 50; 11/3 (PROM): flex 114*, ext 12*    Time 12    Period Weeks    Status On-going  Target Date 06/25/21      PT LONG TERM GOAL #4   Title  Pt will demonstrate SLS of RLE of at least 10sec to demonstrate decreased fall risk and PLOF of contralateral LE    Baseline 04/02/21 LLE 10sec RLE unable; 11/3: LLE 15 sec, RLE 11 sec    Time 8    Period Weeks    Status Achieved                   Plan - 05/15/21 1241     Clinical Impression Statement Pt continues to demonstrate excellent motivation for completion of therapy program.  Continue to progress for several standing exercises including marching, sidestepping, and squatting. Pt also began with leg press and tolerated well. pt contiuing tomake progress with range of motion but still requires further treatment to improve knee extension ROM, ambulation and pain levels. Pt will contiyue to benefit from skilled PT intervention to improve her overall function and QOL.    Personal Factors and Comorbidities Age;Comorbidity 2    Comorbidities DM, anxiety, depression    Examination-Activity Limitations Bed Mobility;Carry;Lift;Locomotion Level;Squat;Sit    Examination-Participation Restrictions Church;Cleaning;Community Activity;Laundry;Driving    Stability/Clinical Decision Making Evolving/Moderate complexity    Rehab Potential Good    PT Frequency 2x / week    PT Duration 12 weeks    PT Treatment/Interventions ADLs/Self Care Home Management;Gait training;Stair training;Functional mobility training;Therapeutic activities;Therapeutic exercise;Balance training;Patient/family education;Passive range of motion;Cryotherapy;Aquatic Therapy;Neuromuscular re-education;Manual techniques;Dry needling;Energy conservation;Scar mobilization;Electrical Stimulation    PT Next Visit Plan Manual Therapy for ROM/Pain relief as appropriate, Progressive LE strengthening and functional mobility training to normalize gait.    PT Home Exercise Plan 05/10/2021-Access Code: [email or web address]  URL: [email or web address]    Consulted and Agree with Plan of Care Patient             Patient will benefit  from skilled therapeutic intervention in order to improve the following deficits and impairments:  Abnormal gait, Decreased activity tolerance, Decreased balance, Decreased endurance, Decreased knowledge of use of DME, Decreased mobility, Decreased strength, Decreased range of motion, Hypomobility, Increased muscle spasms, Impaired flexibility, Decreased skin integrity, Difficulty walking, Improper body mechanics, Pain  Visit Diagnosis: Abnormality of gait and mobility  Difficulty in walking, not elsewhere classified  Total knee replacement status, right  Muscle weakness (generalized)     Problem List Patient Active Problem List   Diagnosis Date Noted   OA (osteoarthritis) of knee 03/26/2021   Primary osteoarthritis of right knee 03/26/2021   Leukocytosis 04/19/2020   Thyroid cyst 02/26/2018   Plantar fasciitis 02/19/2016   Family history of breast cancer in sister 03/10/2015   Breast cancer of lower-outer quadrant of right female breast ([place]) 02/10/2015   Abdominal bloating 12/16/2013   Edema 11/09/2013   Urge incontinence 05/05/2013   Atrophic vaginitis 05/05/2013   Syncope 02/15/2013   Irritable bowel syndrome 08/20/2012   Hearing loss 11/07/2011   Generalized anxiety disorder 11/07/2011   Peripheral neuropathy 07/05/2011   Osteoporosis 07/05/2011   Tinnitus, bilateral 04/05/2011   HYPERCHOLESTEROLEMIA 04/05/2010   ANEMIA, IRON DEFICIENCY 04/03/2010   DEPRESSION, MAJOR, MODERATE 04/03/2010   Essential hypertension 04/03/2010   ALLERGIC RHINITIS 04/03/2010   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/03/2010   POSTMENOPAUSAL SYNDROME 04/03/2010   DEGENERATIVE JOINT DISEASE, CERVICAL SPINE 04/03/2010   FIBROMYALGIA 04/03/2010    Particia Lather, PT 05/15/2021, 12:50 PM  [place] MAIN [facility] SERVICES [address] [place], [place], [address] Phone: [phone]   Fax:  [phone]  Name: MACYN SHROPSHIRE MRN:  [id number] Date of Birth: 1951/04/28

## 2021-05-17 ENCOUNTER — Other Ambulatory Visit: Payer: Self-pay

## 2021-05-17 ENCOUNTER — Ambulatory Visit: Payer: Medicare PPO | Admitting: Physical Therapy

## 2021-05-17 ENCOUNTER — Encounter: Payer: Medicare PPO | Admitting: Physical Therapy

## 2021-05-17 DIAGNOSIS — Z96651 Presence of right artificial knee joint: Secondary | ICD-10-CM

## 2021-05-17 DIAGNOSIS — R2681 Unsteadiness on feet: Secondary | ICD-10-CM | POA: Diagnosis not present

## 2021-05-17 DIAGNOSIS — R2689 Other abnormalities of gait and mobility: Secondary | ICD-10-CM

## 2021-05-17 DIAGNOSIS — R269 Unspecified abnormalities of gait and mobility: Secondary | ICD-10-CM

## 2021-05-17 DIAGNOSIS — R278 Other lack of coordination: Secondary | ICD-10-CM | POA: Diagnosis not present

## 2021-05-17 DIAGNOSIS — R262 Difficulty in walking, not elsewhere classified: Secondary | ICD-10-CM

## 2021-05-17 DIAGNOSIS — M6281 Muscle weakness (generalized): Secondary | ICD-10-CM | POA: Diagnosis not present

## 2021-05-17 NOTE — Therapy (Addendum)
[place] MAIN [facility] SERVICES [address] [place], [place], [address] Phone: [phone]   Fax:  [phone]  Physical Therapy Treatment  Patient Details  Name: Shannon Hancock MRN: [id number] Date of Birth: 06-28-1951 Referring Provider (PT): [name] (Sports medicine @ [facility] [place])   Encounter Date: 05/17/2021   PT End of Session - 05/17/21 1306     Visit Number 14   Number of Visits 17    Date for PT Re-Evaluation 06/01/21    Authorization Type Auth through 12/2; progress note on 11/3    Authorization - Number of Visits 17    Progress Note Due on Visit 20    PT Start Time 1058    PT Stop Time 1145    PT Time Calculation (min) 47 min    Equipment Utilized During Treatment Gait belt    Activity Tolerance Patient tolerated treatment well;Patient limited by pain    Behavior During Therapy [facility] for tasks assessed/performed             Past Medical History:  Diagnosis Date   Allergy    Anxiety    Breast cancer ([place]) 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; ER/PR+, Her2-, 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%   Depression    Diabetes mellitus without complication (HCC)    Diverticulosis    Fibromyalgia    GERD (gastroesophageal reflux disease)    Heart murmur    "no murmur" documented in PCP note by [name], MD 02/09/21   High cholesterol    Hypertension    Joint pain     Past Surgical History:  Procedure Laterality Date   BREAST LUMPECTOMY Right 2016   BREAST LUMPECTOMY WITH RADIOACTIVE SEED AND SENTINEL LYMPH NODE BIOPSY Right 02/13/2015   Procedure: RIGHT BREAST LUMPECTOMY WITH RADIOACTIVE SEED AND SENTINEL LYMPH NODE MAPPING;  Surgeon: [name], MD;  Location: [facility];  Service: General;  Laterality: Right;   FOOT SURGERY Left    TOTAL KNEE ARTHROPLASTY Right 03/26/2021   Procedure: TOTAL KNEE ARTHROPLASTY;  Surgeon: [name], MD;  [facility];  Service: Orthopedics;  Laterality: Right;   TUBAL  LIGATION      There were no vitals filed for this visit.   Subjective Assessment - 05/17/21 1101     Subjective Patient reports doing okay today. Pt reports pain level of 8 this AM. Pt reports pain level of 5 at this time following taking pain medications earlier this AM.    Pertinent History [name] is a 70 y.o. female presenting to physical therapy s/p R TKA 03/26/21. Pt reports to therapy amb with RW. Pt resides in a two story home with six steps to enter from the front and a ramp in the back. She reports negotiating stairs with assistance from her husband. She reports being able to negotiate and live on the main story of her home. Pt reports she would like to increase her R knee mobility, strength, and decrease her pain in order to walk without AD and go bowling. PMH includes breast cancer, fibromyalgia, DM, HTN, anxiety, depression, and hearing loss. Pt denies any unexplained weight fluctuation, saddle paresthesia, unrelenting night pain, or falls in the last 6 months.    Limitations Lifting;Walking;Standing;House hold activities    How long can you sit comfortably? 5-10 min    How long can you stand comfortably? 5 min    How long can you walk comfortably? 5-8 minutes  Treatment provided this session    Therex:  Nustep x 5 min level 2 for PROM knee flexion and extension in addition to muscular and CV endurance training   Standing hip ext 2 x 10 with RTB    SLR with cues for maintaining quad set 1 x 10    QS with towell under heel 1 x 10 x 5 sec with overpressure from PT   8 degrees from full knee extension measured foolwing QS qith overpressure exercise   There Act: The following activities were completed in parallel bars or at balance bar, utilization of UE for balance and support with all of the below exercises    Unless otherwise stated, SBA was provided and gait belt donned in order to ensure pt safety  Sidestepping with RTB around ankles 2 x 2  reps length of // bars  Step up 2 x 10 anterior, 2x 10 lateral steps ups  Standing squats 2 x 10  Standing Calf raises 2x 15 -L LE on 2 inch step to improve L knee extension with task  STS 2 x 10 cues for equal WB  Standing hip march x 15 ea with cues for R knee ext  Hurdle step over with emphasis on heel strike pattern of gait 2 x 10    Pt educated throughout session about proper posture and technique with exercises. Improved exercise technique, movement at target joints, use of target muscles after min to mod verbal, visual, tactile cues.   Ice provided to pt right knee for last 5 minutes of therapy session- NO charge associated                   PT Education - 05/17/21 1306     Education Details Progress with therapy, devices to improve knee extension to ask MD about    Person(s) Educated Patient    Methods Explanation;Demonstration;Tactile cues    Comprehension Verbalized understanding;Returned demonstration;Verbal cues required              PT Short Term Goals - 05/03/21 1346       PT SHORT TERM GOAL #1   Title Pt will demonstrate independence with HEP to improve R LE function for increased ability to participate with ADLs    Baseline 11/3: pt asking for review of HEP    Time 4    Period Weeks    Status Partially Met    Target Date 04/30/21               PT Long Term Goals - 05/03/21 1347       PT LONG TERM GOAL #1   Title Patient will increase FOTO score to 49 to demonstrate predicted increase in functional mobility to complete ADLs    Baseline 04/02/21: 1; 11/3: 29    Time 12    Period Weeks    Status On-going      PT LONG TERM GOAL #2   Title Pt will perform 5XSTS in 12 seconds in order to demonstrate improved LE strength and decrease the likelihood of falling.    Baseline 04/02/21: 25; 11/3: 11 seconds    Time 12    Period Weeks    Status Achieved    Target Date 06/25/21      PT LONG TERM GOAL #3   Title Pt will actively  demonstrate 0 deg R knee extension and 120 deg R knee flexion in order to safely negotiate steps and normalize gait without AD.  Baseline 04/02/21 R knee: flex 80 deg, ext: 50; 11/3 (PROM): flex 114*, ext 12*    Time 12    Period Weeks    Status On-going    Target Date 06/25/21      PT LONG TERM GOAL #4   Title Pt will demonstrate SLS of RLE of at least 10sec to demonstrate decreased fall risk and PLOF of contralateral LE    Baseline 04/02/21 LLE 10sec RLE unable; 11/3: LLE 15 sec, RLE 11 sec    Time 8    Period Weeks    Status Achieved                   Plan - 05/17/21 1307     Clinical Impression Statement Pt continues to demonstrate excellent motivation for completion of therapy program. Continue to progress for several standing exercises including marching, sidestepping, and squatting. Held off on leg press today due to significant sotreness and disomfort in the knee and surrounding musculature (likely DOMS). Pt contiuing tomake progress with range of motion but still requires further treatment to improve knee extension ROM, ambulation and pain levels. Pt will continue to benefit from skilled PT intervention to improve her overall function and QOL.    Personal Factors and Comorbidities Age;Comorbidity 2    Comorbidities DM, anxiety, depression    Examination-Activity Limitations Bed Mobility;Carry;Lift;Locomotion Level;Squat;Sit    Examination-Participation Restrictions Church;Cleaning;Community Activity;Laundry;Driving    Stability/Clinical Decision Making Evolving/Moderate complexity    Rehab Potential Good    PT Frequency 2x / week    PT Duration 12 weeks    PT Treatment/Interventions ADLs/Self Care Home Management;Gait training;Stair training;Functional mobility training;Therapeutic activities;Therapeutic exercise;Balance training;Patient/family education;Passive range of motion;Cryotherapy;Aquatic Therapy;Neuromuscular re-education;Manual techniques;Dry needling;Energy  conservation;Scar mobilization;Electrical Stimulation    PT Next Visit Plan Manual Therapy for ROM/Pain relief as appropriate, Progressive LE strengthening and functional mobility training to normalize gait.    PT Home Exercise Plan 05/10/2021-Access Code: [email or web address]  URL: [email or web address]    Consulted and Agree with Plan of Care Patient             Patient will benefit from skilled therapeutic intervention in order to improve the following deficits and impairments:  Abnormal gait, Decreased activity tolerance, Decreased balance, Decreased endurance, Decreased knowledge of use of DME, Decreased mobility, Decreased strength, Decreased range of motion, Hypomobility, Increased muscle spasms, Impaired flexibility, Decreased skin integrity, Difficulty walking, Improper body mechanics, Pain  Visit Diagnosis: Abnormality of gait and mobility  Difficulty in walking, not elsewhere classified  Total knee replacement status, right  Other abnormalities of gait and mobility     Problem List Patient Active Problem List   Diagnosis Date Noted   OA (osteoarthritis) of knee 03/26/2021   Primary osteoarthritis of right knee 03/26/2021   Leukocytosis 04/19/2020   Thyroid cyst 02/26/2018   Plantar fasciitis 02/19/2016   Family history of breast cancer in sister 03/10/2015   Breast cancer of lower-outer quadrant of right female breast ([place]) 02/10/2015   Abdominal bloating 12/16/2013   Edema 11/09/2013   Urge incontinence 05/05/2013   Atrophic vaginitis 05/05/2013   Syncope 02/15/2013   Irritable bowel syndrome 08/20/2012   Hearing loss 11/07/2011   Generalized anxiety disorder 11/07/2011   Peripheral neuropathy 07/05/2011   Osteoporosis 07/05/2011   Tinnitus, bilateral 04/05/2011   HYPERCHOLESTEROLEMIA 04/05/2010   ANEMIA, IRON DEFICIENCY 04/03/2010   DEPRESSION, MAJOR, MODERATE 04/03/2010   Essential hypertension 04/03/2010   ALLERGIC RHINITIS 04/03/2010   Chronic  interstitial cystitis with hematuria 04/03/2010   POSTMENOPAUSAL  SYNDROME 04/03/2010   DEGENERATIVE JOINT DISEASE, CERVICAL SPINE 04/03/2010   FIBROMYALGIA 04/03/2010    Particia Lather, PT 05/17/2021, 1:10 PM  [place] MAIN [facility] SERVICES [address] [place], [place], [address] Phone: [phone]   Fax:  [phone]  Name: SHIREEN RAYBURN MRN: [id number] Date of Birth: 06-18-1951

## 2021-05-21 ENCOUNTER — Encounter: Payer: Medicare PPO | Admitting: Occupational Therapy

## 2021-05-21 ENCOUNTER — Ambulatory Visit: Payer: Medicare PPO

## 2021-05-21 ENCOUNTER — Other Ambulatory Visit: Payer: Self-pay

## 2021-05-21 DIAGNOSIS — R262 Difficulty in walking, not elsewhere classified: Secondary | ICD-10-CM | POA: Diagnosis not present

## 2021-05-21 DIAGNOSIS — R278 Other lack of coordination: Secondary | ICD-10-CM | POA: Diagnosis not present

## 2021-05-21 DIAGNOSIS — R2689 Other abnormalities of gait and mobility: Secondary | ICD-10-CM | POA: Diagnosis not present

## 2021-05-21 DIAGNOSIS — M6281 Muscle weakness (generalized): Secondary | ICD-10-CM | POA: Diagnosis not present

## 2021-05-21 DIAGNOSIS — R269 Unspecified abnormalities of gait and mobility: Secondary | ICD-10-CM

## 2021-05-21 DIAGNOSIS — Z96651 Presence of right artificial knee joint: Secondary | ICD-10-CM

## 2021-05-21 DIAGNOSIS — R2681 Unsteadiness on feet: Secondary | ICD-10-CM | POA: Diagnosis not present

## 2021-05-21 NOTE — Therapy (Signed)
[place] MAIN [facility] SERVICES [address] [place], [place], [address] Phone: [phone]   Fax:  [phone]  Physical Therapy Treatment  Patient Details  Name: Shannon Hancock MRN: [id number] Date of Birth: August 25, 1950 Referring Provider (PT): Shannon Hearing MD (Sports medicine @ [facility] [place])   Encounter Date: 05/21/2021   PT End of Session - 05/21/21 1338     Visit Number 15    Number of Visits 17    Date for PT Re-Evaluation 06/01/21    Authorization Type Auth through 12/2; progress note on 11/3    Authorization - Number of Visits 17    Progress Note Due on Visit 20    PT Start Time 1101    PT Stop Time 1148    PT Time Calculation (min) 47 min    Equipment Utilized During Treatment Gait belt    Activity Tolerance Patient tolerated treatment well;Patient limited by pain    Behavior During Therapy [facility] for tasks assessed/performed             Past Medical History:  Diagnosis Date   Allergy    Anxiety    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; ER/PR+, Her2-, 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%   Depression    Diabetes mellitus without complication ([place])    Diverticulosis    Fibromyalgia    GERD (gastroesophageal reflux disease)    Heart murmur    "no murmur" documented in PCP note by Shannon Stains, MD 02/09/21   High cholesterol    Hypertension    Joint pain     Past Surgical History:  Procedure Laterality Date   BREAST LUMPECTOMY Right 2016   BREAST LUMPECTOMY WITH RADIOACTIVE SEED AND SENTINEL LYMPH NODE BIOPSY Right 02/13/2015   Procedure: RIGHT BREAST LUMPECTOMY WITH RADIOACTIVE SEED AND SENTINEL LYMPH NODE MAPPING;  Surgeon: Shannon Messing III, MD;  Location: [place];  Service: General;  Laterality: Right;   FOOT SURGERY Left    TOTAL KNEE ARTHROPLASTY Right 03/26/2021   Procedure: TOTAL KNEE ARTHROPLASTY;  Surgeon: Shannon Arabian, MD;  Location[facility];  Service: Orthopedics;  Laterality: Right;   TUBAL  LIGATION      There were no vitals filed for this visit.   Subjective Assessment - 05/21/21 1327     Subjective Patient reports she drove today to PT. Reports medial knee pain intermittently. Mixed up her appointment times today.    Pertinent History Shannon Hancock is a 70 y.o. female presenting to physical therapy s/p R TKA 03/26/21. Pt reports to therapy amb with RW. Pt resides in a two story home with six steps to enter from the front and a ramp in the back. She reports negotiating stairs with assistance from her husband. She reports being able to negotiate and live on the main story of her home. Pt reports she would like to increase her R knee mobility, strength, and decrease her pain in order to walk without AD and go bowling. PMH includes breast cancer, fibromyalgia, DM, HTN, anxiety, depression, and Hancock loss. Pt denies any unexplained weight fluctuation, saddle paresthesia, unrelenting night pain, or falls in the last 6 months.    Limitations Lifting;Walking;Standing;House hold activities    How long can you sit comfortably? 5-10 min    How long can you stand comfortably? 5 min    How long can you walk comfortably? 5-8 minutes    Currently in Pain? Yes    Pain Score 3  Pain Location Knee    Pain Orientation Right    Pain Descriptors / Indicators Aching    Pain Type Surgical pain            Patient reports she drove here today.    Therex:    supine: QS with towel under heel 1 x 10 x 5 sec with overpressure from PT   9 degrees from full knee extension measured foolwing QS with overpressure exercise   Contract relax against PT shoulder with increasing ROM x15  Patella mobilizations 3x 30 seconds each direction  Tib fib rotational mob 10 seconds x 3 trials for full extension   Seated: LAQ with 2lb ankle weight 10x each LE Knee extension with leg on chair 30 second hold   The following activities were completed in parallel bars or at balance bar, utilization of  UE for balance and support with all of the below exercises    Unless otherwise stated, SBA was provided and gait belt donned in order to ensure pt safety   Sidestepping with RTB around ankles 2 x 2 reps length of // bars  RTB three way hip raise with focus on straightening LE 10x each LE Walking hamstring stretch 2x length of // bars cues for knee extension  STS 2 x 10 cues for equal WB  Hurdle step over with emphasis on heel strike pattern of gait 2 x 10   TKE with RTB 15x with cues for knee extension rather than hip extension    Pt educated throughout session about proper posture and technique with exercises. Improved exercise technique, movement at target joints, use of target muscles after min to mod verbal, visual, tactile cues.     K tape provided to R knee in C shape with education on wearing and when to take off.   Ice provided to pt right knee for last 5 minutes of therapy session- NO charge associated       Patient is challenged with knee extension and controlled quad activation. Education on activation techniques and reduction of compensatory mechanisms tolerated well. Patient is very motivated throughout session. Mobilization to LE reduced pain and did increase available range slightly indicating musculoskeletal impairment limitations. Pt contiuing to make progress with range of motion but still requires further treatment to improve knee extension ROM, ambulation and pain levels. Pt will continue to benefit from skilled PT intervention to improve her overall function and QOL                     PT Education - 05/21/21 1328     Education Details exercise technique, body mechanics, knee extension    Person(s) Educated Patient    Methods Explanation;Demonstration;Tactile cues;Verbal cues    Comprehension Verbalized understanding;Returned demonstration;Verbal cues required              PT Short Term Goals - 05/03/21 1346       PT SHORT TERM GOAL #1    Title Pt will demonstrate independence with HEP to improve R LE function for increased ability to participate with ADLs    Baseline 11/3: pt asking for review of HEP    Time 4    Period Weeks    Status Partially Met    Target Date 04/30/21               PT Long Term Goals - 05/03/21 1347       PT LONG TERM GOAL #1   Title Patient will increase  FOTO score to 49 to demonstrate predicted increase in functional mobility to complete ADLs    Baseline 04/02/21: 1; 11/3: 29    Time 12    Period Weeks    Status On-going      PT LONG TERM GOAL #2   Title Pt will perform 5XSTS in 12 seconds in order to demonstrate improved LE strength and decrease the likelihood of falling.    Baseline 04/02/21: 25; 11/3: 11 seconds    Time 12    Period Weeks    Status Achieved    Target Date 06/25/21      PT LONG TERM GOAL #3   Title Pt will actively demonstrate 0 deg R knee extension and 120 deg R knee flexion in order to safely negotiate steps and normalize gait without AD.    Baseline 04/02/21 R knee: flex 80 deg, ext: 50; 11/3 (PROM): flex 114*, ext 12*    Time 12    Period Weeks    Status On-going    Target Date 06/25/21      PT LONG TERM GOAL #4   Title Pt will demonstrate SLS of RLE of at least 10sec to demonstrate decreased fall risk and PLOF of contralateral LE    Baseline 04/02/21 LLE 10sec RLE unable; 11/3: LLE 15 sec, RLE 11 sec    Time 8    Period Weeks    Status Achieved                   Plan - 05/21/21 1341     Clinical Impression Statement Patient is challenged with knee extension and controlled quad activation. Education on activation techniques and reduction of compensatory mechanisms tolerated well. Patient is very motivated throughout session. Mobilization to LE reduced pain and did increase available range slightly indicating musculoskeletal impairment limitations. Pt contiuing to make progress with range of motion but still requires further treatment to improve  knee extension ROM, ambulation and pain levels. Pt will continue to benefit from skilled PT intervention to improve her overall function and QOL    Personal Factors and Comorbidities Age;Comorbidity 2    Comorbidities DM, anxiety, depression    Examination-Activity Limitations Bed Mobility;Carry;Lift;Locomotion Level;Squat;Sit    Examination-Participation Restrictions Church;Cleaning;Community Activity;Laundry;Driving    Stability/Clinical Decision Making Evolving/Moderate complexity    Rehab Potential Good    PT Frequency 2x / week    PT Duration 12 weeks    PT Treatment/Interventions ADLs/Self Care Home Management;Gait training;Stair training;Functional mobility training;Therapeutic activities;Therapeutic exercise;Balance training;Patient/family education;Passive range of motion;Cryotherapy;Aquatic Therapy;Neuromuscular re-education;Manual techniques;Dry needling;Energy conservation;Scar mobilization;Electrical Stimulation    PT Next Visit Plan Manual Therapy for ROM/Pain relief as appropriate, Progressive LE strengthening and functional mobility training to normalize gait.    PT Home Exercise Plan 05/10/2021-Access Code: [email or web address]  URL: [email or web address]    Consulted and Agree with Plan of Care Patient             Patient will benefit from skilled therapeutic intervention in order to improve the following deficits and impairments:  Abnormal gait, Decreased activity tolerance, Decreased balance, Decreased endurance, Decreased knowledge of use of DME, Decreased mobility, Decreased strength, Decreased range of motion, Hypomobility, Increased muscle spasms, Impaired flexibility, Decreased skin integrity, Difficulty walking, Improper body mechanics, Pain  Visit Diagnosis: Abnormality of gait and mobility  Difficulty in walking, not elsewhere classified  Total knee replacement status, right     Problem List Patient Active Problem List   Diagnosis Date Noted   OA  (osteoarthritis) of knee 03/26/2021  Primary osteoarthritis of right knee 03/26/2021   Leukocytosis 04/19/2020   Thyroid cyst 02/26/2018   Plantar fasciitis 02/19/2016   Family history of breast cancer in sister 03/10/2015   Breast cancer of lower-outer quadrant of right female breast ([place]) 02/10/2015   Abdominal bloating 12/16/2013   Edema 11/09/2013   Urge incontinence 05/05/2013   Atrophic vaginitis 05/05/2013   Syncope 02/15/2013   Irritable bowel syndrome 08/20/2012   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   Generalized anxiety disorder 11/07/2011   Peripheral neuropathy 07/05/2011   Osteoporosis 07/05/2011   Tinnitus, bilateral 04/05/2011   HYPERCHOLESTEROLEMIA 04/05/2010   ANEMIA, IRON DEFICIENCY 04/03/2010   DEPRESSION, MAJOR, MODERATE 04/03/2010   Essential hypertension 04/03/2010   ALLERGIC RHINITIS 04/03/2010   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/03/2010   POSTMENOPAUSAL SYNDROME 04/03/2010   DEGENERATIVE JOINT DISEASE, CERVICAL SPINE 04/03/2010   FIBROMYALGIA 04/03/2010    Janna Arch, PT, DPT  05/21/2021, 1:42 PM  [place] MAIN [facility] SERVICES [address] [place], [place], [address] Phone: [phone]   Fax:  [phone]  Name: Shannon Hancock MRN: [id number] Date of Birth: 07-Dec-1950

## 2021-05-22 ENCOUNTER — Encounter: Payer: Medicare PPO | Admitting: Physical Therapy

## 2021-05-23 ENCOUNTER — Ambulatory Visit: Payer: Medicare PPO | Admitting: Physical Therapy

## 2021-05-23 ENCOUNTER — Encounter: Payer: Medicare PPO | Admitting: Occupational Therapy

## 2021-05-23 ENCOUNTER — Other Ambulatory Visit: Payer: Self-pay

## 2021-05-23 DIAGNOSIS — R269 Unspecified abnormalities of gait and mobility: Secondary | ICD-10-CM | POA: Diagnosis not present

## 2021-05-23 DIAGNOSIS — R2689 Other abnormalities of gait and mobility: Secondary | ICD-10-CM | POA: Diagnosis not present

## 2021-05-23 DIAGNOSIS — M6281 Muscle weakness (generalized): Secondary | ICD-10-CM | POA: Diagnosis not present

## 2021-05-23 DIAGNOSIS — R262 Difficulty in walking, not elsewhere classified: Secondary | ICD-10-CM

## 2021-05-23 DIAGNOSIS — R2681 Unsteadiness on feet: Secondary | ICD-10-CM | POA: Diagnosis not present

## 2021-05-23 DIAGNOSIS — Z96651 Presence of right artificial knee joint: Secondary | ICD-10-CM | POA: Diagnosis not present

## 2021-05-23 DIAGNOSIS — R278 Other lack of coordination: Secondary | ICD-10-CM | POA: Diagnosis not present

## 2021-05-23 NOTE — Therapy (Signed)
[place] MAIN [facility] SERVICES [address] [place], [place], [address] Phone: [phone]   Fax:  [phone]  Physical Therapy Treatment  Patient Details  Name: Shannon Hancock MRN: [id number] Date of Birth: April 18, 1951 Referring Provider (PT): [name] (Sports medicine @ [facility])   Encounter Date: 05/23/2021   PT End of Session - 05/23/21 1006     Visit Number 16    Number of Visits 17    Date for PT Re-Evaluation 06/01/21    Authorization Type Auth through 12/2; progress note on 11/3    Authorization - Number of Visits 17    Progress Note Due on Visit 20    PT Start Time 1002    PT Stop Time 1043    PT Time Calculation (min) 41 min    Equipment Utilized During Treatment Gait belt    Activity Tolerance Patient tolerated treatment well;Patient limited by pain    Behavior During Therapy [facility] for tasks assessed/performed             Past Medical History:  Diagnosis Date   Allergy    Anxiety    Breast cancer ([place]) 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; ER/PR+, Her2-, 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%   Depression    Diabetes mellitus without complication ([place])    Diverticulosis    Fibromyalgia    GERD (gastroesophageal reflux disease)    Heart murmur    "no murmur" documented in PCP note by [name], MD 02/09/21   High cholesterol    Hypertension    Joint pain     Past Surgical History:  Procedure Laterality Date   BREAST LUMPECTOMY Right 2016   BREAST LUMPECTOMY WITH RADIOACTIVE SEED AND SENTINEL LYMPH NODE BIOPSY Right 02/13/2015   Procedure: RIGHT BREAST LUMPECTOMY WITH RADIOACTIVE SEED AND SENTINEL LYMPH NODE MAPPING;  Surgeon: [name], MD;  Location: [place];  Service: General;  Laterality: Right;   FOOT SURGERY Left    TOTAL KNEE ARTHROPLASTY Right 03/26/2021   Procedure: TOTAL KNEE ARTHROPLASTY;  Surgeon: [name], MD;  [facility];  Service: Orthopedics;  Laterality: Right;   TUBAL  LIGATION      There were no vitals filed for this visit.   Subjective Assessment - 05/23/21 1005     Subjective Patient reports doing okay today. Pt reports pain level of 8 this AM. Pt reports pain level of 6 at this time following taking pain medications earlier this AM.    Pertinent History Shannon Hancock is a 70 y.o. female presenting to physical therapy s/p R TKA 03/26/21. Pt reports to therapy amb with RW. Pt resides in a two story home with six steps to enter from the front and a ramp in the back. She reports negotiating stairs with assistance from her husband. She reports being able to negotiate and live on the main story of her home. Pt reports she would like to increase her R knee mobility, strength, and decrease her pain in order to walk without AD and go bowling. PMH includes breast cancer, fibromyalgia, DM, HTN, anxiety, depression, and hearing loss. Pt denies any unexplained weight fluctuation, saddle paresthesia, unrelenting night pain, or falls in the last 6 months.    Limitations Lifting;Walking;Standing;House hold activities    How long can you sit comfortably? 5-10 min    How long can you stand comfortably? 5 min    How long can you walk comfortably? 5-8 minutes    Pain Score  6     Pain Location Knee    Pain Orientation Right    Pain Descriptors / Indicators Aching    Pain Type Surgical pain                 Treatment provided this session  Therex:   Therex:  Nustep x 5 min level 2 for PROM knee flexion and extension in addition to muscular and CV endurance training    SLR with cues for maintaining quad set 1 x 10    QS with towell under heel 1 x 10 x 5 sec   8 degrees from full knee extension measured foolwing QS qith overpressure exercise        Manual Therapy:  R PA joint mobs (tib-fem) to R knee in various degrees of motion x 30 each.   R ER tib-fem joint mobs at various degrees of motion, x 30 each   R Extension end range overpressure at end  range extension 3 x 30 sec, with 20 x PA joint mobs )tib fem)   Knee extension measured at 6 degrees from full extension today following manual therapy and stretches     Pt educated throughout session about proper posture and technique with exercises. Improved exercise technique, movement at target joints, use of target muscles after min to mod verbal, visual, tactile cues.      There Act: The following activities were completed in parallel bars or at balance bar, utilization of UE for balance and support with all of the below exercises    Unless otherwise stated, SBA was provided and gait belt donned in order to ensure pt safety  Step up 2 x 12 anterior, min c/o discomfort at insertion of patellar tendon  Standing Calf raises 2x 15 -Standing hip march x 15 ea with cues for R knee ext 1/2 foam roller step over with emphasis on heel strike pattern of gait 2 x 10  Ambulation 60 feet with focus on heel strike and terminal knee extension. Also cues for hip IR (toe pointed forward)    Pt educated throughout session about proper posture and technique with exercises. Improved exercise technique, movement at target joints, use of target muscles after min to mod verbal, visual, tactile cues.   Ice provided to pt right knee for last 5 minutes of therapy session- NO charge associated                     PT Education - 05/23/21 1006     Education Details Exercise technique, knee extension    Person(s) Educated Patient    Methods Explanation;Demonstration;Tactile cues;Verbal cues    Comprehension Verbalized understanding;Returned demonstration;Verbal cues required              PT Short Term Goals - 05/03/21 1346       PT SHORT TERM GOAL #1   Title Pt will demonstrate independence with HEP to improve R LE function for increased ability to participate with ADLs    Baseline 11/3: pt asking for review of HEP    Time 4    Period Weeks    Status Partially Met    Target  Date 04/30/21               PT Long Term Goals - 05/03/21 1347       PT LONG TERM GOAL #1   Title Patient will increase FOTO score to 49 to demonstrate predicted increase in functional mobility to complete ADLs  Baseline 04/02/21: 1; 11/3: 29    Time 12    Period Weeks    Status On-going      PT LONG TERM GOAL #2   Title Pt will perform 5XSTS in 12 seconds in order to demonstrate improved LE strength and decrease the likelihood of falling.    Baseline 04/02/21: 25; 11/3: 11 seconds    Time 12    Period Weeks    Status Achieved    Target Date 06/25/21      PT LONG TERM GOAL #3   Title Pt will actively demonstrate 0 deg R knee extension and 120 deg R knee flexion in order to safely negotiate steps and normalize gait without AD.    Baseline 04/02/21 R knee: flex 80 deg, ext: 50; 11/3 (PROM): flex 114*, ext 12*    Time 12    Period Weeks    Status On-going    Target Date 06/25/21      PT LONG TERM GOAL #4   Title Pt will demonstrate SLS of RLE of at least 10sec to demonstrate decreased fall risk and PLOF of contralateral LE    Baseline 04/02/21 LLE 10sec RLE unable; 11/3: LLE 15 sec, RLE 11 sec    Time 8    Period Weeks    Status Achieved                   Plan - 05/23/21 1007     Clinical Impression Statement Pt continues to demonstrate excellent motivation for completion of therapy program. Continue to progress for several standing exercises including marching, sidestepping. Pt also challenged with several exercises utilizing terminal knee extension including walking with heel strike and other closed chain activities.  Pt contiuing tomake progress with range of motion but still requires further treatment to improve knee extension ROM, ambulation and pain levels. Pt will continue to benefit from skilled PT intervention to improve her overall function and QOL.    Personal Factors and Comorbidities Age;Comorbidity 2    Comorbidities DM, anxiety, depression     Examination-Activity Limitations Bed Mobility;Carry;Lift;Locomotion Level;Squat;Sit    Examination-Participation Restrictions Church;Cleaning;Community Activity;Laundry;Driving    Stability/Clinical Decision Making Evolving/Moderate complexity    Rehab Potential Good    PT Frequency 2x / week    PT Duration 12 weeks    PT Treatment/Interventions ADLs/Self Care Home Management;Gait training;Stair training;Functional mobility training;Therapeutic activities;Therapeutic exercise;Balance training;Patient/family education;Passive range of motion;Cryotherapy;Aquatic Therapy;Neuromuscular re-education;Manual techniques;Dry needling;Energy conservation;Scar mobilization;Electrical Stimulation    PT Next Visit Plan Manual Therapy for ROM/Pain relief as appropriate, Progressive LE strengthening and functional mobility training to normalize gait.    PT Home Exercise Plan 05/10/2021-Access Code: [email or web address]  URL: [email or web address]    Consulted and Agree with Plan of Care Patient             Patient will benefit from skilled therapeutic intervention in order to improve the following deficits and impairments:  Abnormal gait, Decreased activity tolerance, Decreased balance, Decreased endurance, Decreased knowledge of use of DME, Decreased mobility, Decreased strength, Decreased range of motion, Hypomobility, Increased muscle spasms, Impaired flexibility, Decreased skin integrity, Difficulty walking, Improper body mechanics, Pain  Visit Diagnosis: Abnormality of gait and mobility  Difficulty in walking, not elsewhere classified  Total knee replacement status, right  Other abnormalities of gait and mobility     Problem List Patient Active Problem List   Diagnosis Date Noted   OA (osteoarthritis) of knee 03/26/2021   Primary osteoarthritis of right knee 03/26/2021   Leukocytosis 04/19/2020  Thyroid cyst 02/26/2018   Plantar fasciitis 02/19/2016   Family history of breast cancer  in sister 03/10/2015   Breast cancer of lower-outer quadrant of right female breast ([place]) 02/10/2015   Abdominal bloating 12/16/2013   Edema 11/09/2013   Urge incontinence 05/05/2013   Atrophic vaginitis 05/05/2013   Syncope 02/15/2013   Irritable bowel syndrome 08/20/2012   Hearing loss 11/07/2011   Generalized anxiety disorder 11/07/2011   Peripheral neuropathy 07/05/2011   Osteoporosis 07/05/2011   Tinnitus, bilateral 04/05/2011   HYPERCHOLESTEROLEMIA 04/05/2010   ANEMIA, IRON DEFICIENCY 04/03/2010   DEPRESSION, MAJOR, MODERATE 04/03/2010   Essential hypertension 04/03/2010   ALLERGIC RHINITIS 04/03/2010   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/03/2010   POSTMENOPAUSAL SYNDROME 04/03/2010   DEGENERATIVE JOINT DISEASE, CERVICAL SPINE 04/03/2010   FIBROMYALGIA 04/03/2010    Particia Lather, PT 05/23/2021, 12:13 PM  [place] MAIN [facility] SERVICES [address] [place], [place], [address] Phone: [phone]   Fax:  [phone]  Name: BRIANNIA LABA MRN: [id number] Date of Birth: 08/25/1950

## 2021-05-29 ENCOUNTER — Ambulatory Visit: Payer: Medicare PPO | Admitting: Physical Therapy

## 2021-05-29 ENCOUNTER — Other Ambulatory Visit: Payer: Self-pay | Admitting: Hematology and Oncology

## 2021-05-29 ENCOUNTER — Encounter: Payer: Medicare PPO | Admitting: Physical Therapy

## 2021-05-29 ENCOUNTER — Other Ambulatory Visit: Payer: Self-pay

## 2021-05-29 ENCOUNTER — Encounter: Payer: Self-pay | Admitting: Physical Therapy

## 2021-05-29 DIAGNOSIS — R262 Difficulty in walking, not elsewhere classified: Secondary | ICD-10-CM | POA: Diagnosis not present

## 2021-05-29 DIAGNOSIS — Z96651 Presence of right artificial knee joint: Secondary | ICD-10-CM

## 2021-05-29 DIAGNOSIS — M6281 Muscle weakness (generalized): Secondary | ICD-10-CM | POA: Diagnosis not present

## 2021-05-29 DIAGNOSIS — R269 Unspecified abnormalities of gait and mobility: Secondary | ICD-10-CM

## 2021-05-29 DIAGNOSIS — R2681 Unsteadiness on feet: Secondary | ICD-10-CM | POA: Diagnosis not present

## 2021-05-29 DIAGNOSIS — R2689 Other abnormalities of gait and mobility: Secondary | ICD-10-CM | POA: Diagnosis not present

## 2021-05-29 DIAGNOSIS — R278 Other lack of coordination: Secondary | ICD-10-CM | POA: Diagnosis not present

## 2021-05-29 NOTE — Therapy (Signed)
[place] MAIN [facility] SERVICES [address] [place], [place], [address] Phone: [phone]   Fax:  [phone]  Physical Therapy Treatment/ Physical Therapy recertification note    Dates of reporting period  04/02/21   to   05/29/21   Patient Details  Name: Shannon Hancock MRN: [id number] Date of Birth: 06/23/1951 Referring Provider (PT): [name] (Sports medicine @ [facility] [place])   Encounter Date: 05/29/2021   PT End of Session - 05/29/21 1107     Visit Number 17    Number of Visits 17    Date for PT Re-Evaluation 06/01/21    Authorization Type Auth through 12/2; progress note on 58/8, new cert 32/54-    Authorization - Number of Visits 17    Progress Note Due on Visit 20    Equipment Utilized During Treatment Gait belt    Activity Tolerance Patient tolerated treatment well;Patient limited by pain    Behavior During Therapy [facility] for tasks assessed/performed             Past Medical History:  Diagnosis Date   Allergy    Anxiety    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; ER/PR+, Her2-, 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%   Depression    Diabetes mellitus without complication ([place])    Diverticulosis    Fibromyalgia    GERD (gastroesophageal reflux disease)    Heart murmur    "no murmur" documented in PCP note by [name], MD 02/09/21   High cholesterol    Hypertension    Joint pain     Past Surgical History:  Procedure Laterality Date   BREAST LUMPECTOMY Right 2016   BREAST LUMPECTOMY WITH RADIOACTIVE SEED AND SENTINEL LYMPH NODE BIOPSY Right 02/13/2015   Procedure: RIGHT BREAST LUMPECTOMY WITH RADIOACTIVE SEED AND SENTINEL LYMPH NODE MAPPING;  Surgeon: [name], MD;  Location: [facility];  Service: General;  Laterality: Right;   FOOT SURGERY Left    TOTAL KNEE ARTHROPLASTY Right 03/26/2021   Procedure: TOTAL KNEE ARTHROPLASTY;  Surgeon: [name], MD;  Location: [facility];  Service:  Orthopedics;  Laterality: Right;   TUBAL LIGATION      There were no vitals filed for this visit.   Subjective Assessment - 05/29/21 1111     Subjective Patient reports doing okay today. Pt reports pain level of 8 this AM. Pt reports pain level of 4 at this time following taking pain medications earlier this AM. Pt rpeorts swelling following trying to cooking meals.    Pertinent History Shannon Hancock is a 70 y.o. female presenting to physical therapy s/p R TKA 03/26/21. Pt reports to therapy amb with RW. Pt resides in a two story home with six steps to enter from the front and a ramp in the back. She reports negotiating stairs with assistance from her husband. She reports being able to negotiate and live on the main story of her home. Pt reports she would like to increase her R knee mobility, strength, and decrease her pain in order to walk without AD and go bowling. PMH includes breast cancer, fibromyalgia, DM, HTN, anxiety, depression, and hearing loss. Pt denies any unexplained weight fluctuation, saddle paresthesia, unrelenting night pain, or falls in the last 6 months.    Limitations Lifting;Walking;Standing;House hold activities    How long can you sit comfortably? 5-10 min    How long can you stand comfortably? 5 min    How long can you walk  comfortably? 5-8 minutes    Pain Score 4     Pain Location Knee    Pain Orientation Right    Pain Descriptors / Indicators Aching    Pain Type Surgical pain    Pain Onset More than a month ago    Pain Frequency Constant    Aggravating Factors  Standing, walking    Pain Relieving Factors meds, ice              Physical therapy treatment session today consisted of completing assessment of goals and administration of testing as demonstrated in flow sheet and and goals sections. Addition treatments may be found below.   Therex:   NuStep level 3 x 5 minutes.  For improved passive range of motion as well as muscular endurance.  Completed  6-minute walk test patient ambulated 855 feet and also demonstrated knee flexion on the right throughout gait pattern as well as right hip external rotation.  Squats in parallel bars with tactile cues and verbal cues to prevent right hip internal rotation and valgus.  2 sets of 10  Knee range of motion measured in supine position patient able to obtain 11 degrees from full knee extension as well as 160 degrees of knee flexion.                         PT Education - 05/29/21 1241     Education Details progress    Person(s) Educated Patient    Methods Explanation;Demonstration    Comprehension Verbalized understanding;Returned demonstration;Verbal cues required              PT Short Term Goals - 05/29/21 1115       PT SHORT TERM GOAL #1   Title Pt will demonstrate independence with HEP to improve R LE function for increased ability to participate with ADLs    Baseline 11/3: pt asking for review of HEP, confident and independent with HEP, not completing on days when she has higher levele of pain    Time 4    Period Weeks    Status Achieved    Target Date 04/30/21               PT Long Term Goals - 05/29/21 1306       PT LONG TERM GOAL #1   Title Patient will increase FOTO score to 49 to demonstrate predicted increase in functional mobility to complete ADLs    Baseline 04/02/21: 1; 11/3: 29, 11/29: 44    Time 8    Period Weeks    Status On-going    Target Date 07/24/21      PT LONG TERM GOAL #2   Title Pt will perform 5XSTS in 12 seconds in order to demonstrate improved LE strength and decrease the likelihood of falling.    Baseline 04/02/21: 25; 11/3: 11 seconds    Time 12    Period Weeks    Status Achieved      PT LONG TERM GOAL #3   Title Pt will actively demonstrate 0 deg R knee extension and 120 deg R knee flexion in order to safely negotiate steps and normalize gait without AD.    Baseline 04/02/21 R knee: flex 80 deg, ext: 50; 11/3  (PROM): flex 114*, ext 12* flex 116, ext 11    Time 8    Period Weeks    Status On-going    Target Date 07/24/21      PT LONG  TERM GOAL #4   Title Pt will demonstrate SLS of RLE of at least 10sec to demonstrate decreased fall risk and PLOF of contralateral LE    Baseline 04/02/21 LLE 10sec RLE unable; 11/3: LLE 15 sec, RLE 11 sec    Time 8    Period Weeks    Status Achieved      PT LONG TERM GOAL #5   Title Pt will improve 6MWT to greater than 1000 feet in order to indicate improved community ambulation capacity.    Baseline 855 feet on 05/29/21    Time 8    Period Weeks    Status New    Target Date 07/24/21                   Plan - 05/29/21 1244     Clinical Impression Statement Pt reports to PT for recertification and treatment today. Pt reports she has some improvement in function as she has ben able to work a little in her home and complee light cookig activities but she has not been able to complete them to the level she would like. Pt demonstrated improvement in her FOTO score indicating improvement in her perception of her funcitonal abilities. Pt still lacking full knee extension and was measured at 11 degrees from full knee extension  and is able to obtain 116 degrees of knee flexion. Pt also demonstrates ability to navigate stairs with improved efficacy. Pt completed 6MWT and was able to ambulate 855 feet and demonstrated knee flexed gait pattern throughout as well as hip external rotation with gait. Pt goals have been updated to reflect progress and HEP has has been progressed adequately.  Patient continue to benefit from skilled physical therapy intervention in order to improve her knee range of motion, ambulatory capacity, gait pattern, improve her ability to complete functional tasks and improve her overall quality of life. Patient's condition has the potential to improve in response to therapy. Maximum improvement is yet to be obtained. The anticipated improvement is  attainable and reasonable in a generally predictable time.      Personal Factors and Comorbidities Age;Comorbidity 2    Comorbidities DM, anxiety, depression    Examination-Activity Limitations Bed Mobility;Carry;Lift;Locomotion Level;Squat;Sit    Examination-Participation Restrictions Church;Cleaning;Community Activity;Laundry;Driving    Stability/Clinical Decision Making Evolving/Moderate complexity    Rehab Potential Good    PT Frequency 2x / week    PT Duration 12 weeks    PT Treatment/Interventions ADLs/Self Care Home Management;Gait training;Stair training;Functional mobility training;Therapeutic activities;Therapeutic exercise;Balance training;Patient/family education;Passive range of motion;Cryotherapy;Aquatic Therapy;Neuromuscular re-education;Manual techniques;Dry needling;Energy conservation;Scar mobilization;Electrical Stimulation    PT Next Visit Plan Manual Therapy for ROM/Pain relief as appropriate, Progressive LE strengthening and functional mobility training to normalize gait.    PT Home Exercise Plan 05/10/2021-Access Code: [email or web address]  URL: [email or web address]    Consulted and Agree with Plan of Care Patient             Patient will benefit from skilled therapeutic intervention in order to improve the following deficits and impairments:  Abnormal gait, Decreased activity tolerance, Decreased balance, Decreased endurance, Decreased knowledge of use of DME, Decreased mobility, Decreased strength, Decreased range of motion, Hypomobility, Increased muscle spasms, Impaired flexibility, Decreased skin integrity, Difficulty walking, Improper body mechanics, Pain  Visit Diagnosis: Abnormality of gait and mobility  Difficulty in walking, not elsewhere classified  Total knee replacement status, right  Other abnormalities of gait and mobility     Problem List Patient Active Problem List   Diagnosis  Date Noted   OA (osteoarthritis) of knee 03/26/2021    Primary osteoarthritis of right knee 03/26/2021   Leukocytosis 04/19/2020   Thyroid cyst 02/26/2018   Plantar fasciitis 02/19/2016   Family history of breast cancer in sister 03/10/2015   Breast cancer of lower-outer quadrant of right female breast ([place]) 02/10/2015   Abdominal bloating 12/16/2013   Edema 11/09/2013   Urge incontinence 05/05/2013   Atrophic vaginitis 05/05/2013   Syncope 02/15/2013   Irritable bowel syndrome 08/20/2012   Hearing loss 11/07/2011   Generalized anxiety disorder 11/07/2011   Peripheral neuropathy 07/05/2011   Osteoporosis 07/05/2011   Tinnitus, bilateral 04/05/2011   HYPERCHOLESTEROLEMIA 04/05/2010   ANEMIA, IRON DEFICIENCY 04/03/2010   DEPRESSION, MAJOR, MODERATE 04/03/2010   Essential hypertension 04/03/2010   ALLERGIC RHINITIS 04/03/2010   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/03/2010   POSTMENOPAUSAL SYNDROME 04/03/2010   DEGENERATIVE JOINT DISEASE, CERVICAL SPINE 04/03/2010   FIBROMYALGIA 04/03/2010    Particia Lather, PT 05/29/2021, 1:18 PM  [place] [facility] MAIN [facility] SERVICES [address] [place], [place], [address] Phone: [phone]   Fax:  [phone]  Name: Shannon Hancock MRN: [id number] Date of Birth: September 15, 1950

## 2021-05-31 ENCOUNTER — Other Ambulatory Visit: Payer: Self-pay

## 2021-05-31 ENCOUNTER — Encounter: Payer: Medicare PPO | Admitting: Physical Therapy

## 2021-05-31 ENCOUNTER — Ambulatory Visit: Payer: Medicare PPO | Attending: Sports Medicine | Admitting: Physical Therapy

## 2021-05-31 DIAGNOSIS — R269 Unspecified abnormalities of gait and mobility: Secondary | ICD-10-CM | POA: Insufficient documentation

## 2021-05-31 DIAGNOSIS — M6281 Muscle weakness (generalized): Secondary | ICD-10-CM | POA: Insufficient documentation

## 2021-05-31 DIAGNOSIS — Z96651 Presence of right artificial knee joint: Secondary | ICD-10-CM | POA: Diagnosis not present

## 2021-05-31 DIAGNOSIS — R2689 Other abnormalities of gait and mobility: Secondary | ICD-10-CM | POA: Diagnosis not present

## 2021-05-31 DIAGNOSIS — R262 Difficulty in walking, not elsewhere classified: Secondary | ICD-10-CM | POA: Insufficient documentation

## 2021-05-31 NOTE — Therapy (Signed)
[place] MAIN [facility] SERVICES [address] [place], [place], [address] Phone: [phone]   Fax:  [phone]  Physical Therapy Treatment  Patient Details  Name: Shannon Hancock MRN: [id number] Date of Birth: 11-27-1950 Referring Provider (PT): [name] (Sports medicine @ [facility] [place])   Encounter Date: 05/31/2021   PT End of Session - 05/31/21 1107     Visit Number 18    Number of Visits 34    Date for PT Re-Evaluation 06/01/21    Authorization Type Auth through 12/2; progress note on 29/1, new cert 91/66-    Authorization - Number of Visits 34    Progress Note Due on Visit 20    PT Start Time 1102    PT Stop Time 1145    PT Time Calculation (min) 43 min    Equipment Utilized During Treatment Gait belt    Activity Tolerance Patient tolerated treatment well;Patient limited by pain    Behavior During Therapy [facility] for tasks assessed/performed             Past Medical History:  Diagnosis Date   Allergy    Anxiety    Breast cancer ([place]) 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; ER/PR+, Her2-, 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%   Depression    Diabetes mellitus without complication ([place])    Diverticulosis    Fibromyalgia    GERD (gastroesophageal reflux disease)    Heart murmur    "no murmur" documented in PCP note by [name], MD 02/09/21   High cholesterol    Hypertension    Joint pain     Past Surgical History:  Procedure Laterality Date   BREAST LUMPECTOMY Right 2016   BREAST LUMPECTOMY WITH RADIOACTIVE SEED AND SENTINEL LYMPH NODE BIOPSY Right 02/13/2015   Procedure: RIGHT BREAST LUMPECTOMY WITH RADIOACTIVE SEED AND SENTINEL LYMPH NODE MAPPING;  Surgeon: [name], MD;  Location: [facility];  Service: General;  Laterality: Right;   FOOT SURGERY Left    TOTAL KNEE ARTHROPLASTY Right 03/26/2021   Procedure: TOTAL KNEE ARTHROPLASTY;  Surgeon: [name], MD;  [facility];  Service: Orthopedics;  Laterality:  Right;   TUBAL LIGATION      There were no vitals filed for this visit.   Subjective Assessment - 05/31/21 1106     Subjective Patient reports doing okay today. Pt reports pain level of 8 this AM. Pt reports pain level of 4 at this time following taking pain medications earlier this AM. Pt rpeorts swelling following trying to cooking meals.    Pertinent History Shannon Hancock is a 70 y.o. female presenting to physical therapy s/p R TKA 03/26/21. Pt reports to therapy amb with RW. Pt resides in a two story home with six steps to enter from the front and a ramp in the back. She reports negotiating stairs with assistance from her husband. She reports being able to negotiate and live on the main story of her home. Pt reports she would like to increase her R knee mobility, strength, and decrease her pain in order to walk without AD and go bowling. PMH includes breast cancer, fibromyalgia, DM, HTN, anxiety, depression, and hearing loss. Pt denies any unexplained weight fluctuation, saddle paresthesia, unrelenting night pain, or falls in the last 6 months.    Limitations Lifting;Walking;Standing;House hold activities    How long can you sit comfortably? 5-10 min    How long can you stand comfortably? 5 min    How long  can you walk comfortably? 5-8 minutes    Pain Onset More than a month ago             Treatment provided this session  Therex:   Therex:  Nustep x 5 min level 2 for PROM knee flexion and extension in addition to muscular and CV endurance training    Knee extension stretch on step 3 x 30 sec -cues for positioning and to prevent excessive trunk flexion   R PA joint mobs (tib-fem) to R knee in various degrees of motion x 30 each.   R Extension end range overpressure at end range extension 3 x 30 sec, with 20 x PA joint mobs )tib fem)   Knee extension measured at 7 degrees from full extension today following manual therapy and stretches     Pt educated throughout session  about proper posture and technique with exercises. Improved exercise technique, movement at target joints, use of target muscles after min to mod verbal, visual, tactile cues.      There Act: The following activities were completed in parallel bars or at balance bar, utilization of UE for balance and support with all of the below exercises    Unless otherwise stated, SBA was provided and gait belt donned in order to ensure pt safety  Step up 2 x 12 anterior,  cues to minimize UE support  Standing Calf raises on 1/2 foam roller to increase DF range of motion 2x 10 -  UE support   Standing hip march x 15 ea with cues for R knee ext on airex pad - no UE support  1/2 foam roller step over with emphasis on heel strike pattern of gait 2 x 10   Step to theraband pad with R LE to focus on proper heel strinke and to prevent hip external rotation. X 20  -added hurdle   Pt educated throughout session about proper posture and technique with exercises. Improved exercise technique, movement at target joints, use of target muscles after min to mod verbal, visual, tactile cues.   Ice provided to pt right knee for last 4 minutes of therapy session- NO charge associated                            PT Education - 05/31/21 1106     Education Details Exercise form and technique    Person(s) Educated Patient    Methods Explanation    Comprehension Verbalized understanding;Returned demonstration              PT Short Term Goals - 05/31/21 1112       PT SHORT TERM GOAL #1   Title Pt will demonstrate independence with HEP to improve R LE function for increased ability to participate with ADLs    Baseline 11/3: pt asking for review of HEP, confident and independent with HEP, not completing on days when she has higher levele of pain    Time 4    Period Weeks    Status Achieved    Target Date 04/30/21               PT Long Term Goals - 05/29/21 1306       PT  LONG TERM GOAL #1   Title Patient will increase FOTO score to 49 to demonstrate predicted increase in functional mobility to complete ADLs    Baseline 04/02/21: 1; 11/3: 29, 11/29: 44    Time 8  Period Weeks    Status On-going    Target Date 07/24/21      PT LONG TERM GOAL #2   Title Pt will perform 5XSTS in 12 seconds in order to demonstrate improved LE strength and decrease the likelihood of falling.    Baseline 04/02/21: 25; 11/3: 11 seconds    Time 12    Period Weeks    Status Achieved      PT LONG TERM GOAL #3   Title Pt will actively demonstrate 0 deg R knee extension and 120 deg R knee flexion in order to safely negotiate steps and normalize gait without AD.    Baseline 04/02/21 R knee: flex 80 deg, ext: 50; 11/3 (PROM): flex 114*, ext 12* flex 116, ext 11    Time 8    Period Weeks    Status On-going    Target Date 07/24/21      PT LONG TERM GOAL #4   Title Pt will demonstrate SLS of RLE of at least 10sec to demonstrate decreased fall risk and PLOF of contralateral LE    Baseline 04/02/21 LLE 10sec RLE unable; 11/3: LLE 15 sec, RLE 11 sec    Time 8    Period Weeks    Status Achieved      PT LONG TERM GOAL #5   Title Pt will improve 6MWT to greater than 1000 feet in order to indicate improved community ambulation capacity.    Baseline 855 feet on 05/29/21    Time 8    Period Weeks    Status New    Target Date 07/24/21                   Plan - 05/31/21 1108     Clinical Impression Statement Pt presents with excellent motivation for completion of PT exercises.  Patient able to obtain improved knee extension with various activities in standing as well as in supine compared to previous session.  Patient also making progress with improving her gait pattern.  Progress with several exercises in standing as described in note.  Patient would he benefit from skilled physical therapy intervention in order to improve her lower extremity strength, gait pattern, knee pain,  and overall function.    Personal Factors and Comorbidities Age;Comorbidity 2    Comorbidities DM, anxiety, depression    Examination-Activity Limitations Bed Mobility;Carry;Lift;Locomotion Level;Squat;Sit    Examination-Participation Restrictions Church;Cleaning;Community Activity;Laundry;Driving    Stability/Clinical Decision Making Evolving/Moderate complexity    Rehab Potential Good    PT Frequency 2x / week    PT Duration 12 weeks    PT Treatment/Interventions ADLs/Self Care Home Management;Gait training;Stair training;Functional mobility training;Therapeutic activities;Therapeutic exercise;Balance training;Patient/family education;Passive range of motion;Cryotherapy;Aquatic Therapy;Neuromuscular re-education;Manual techniques;Dry needling;Energy conservation;Scar mobilization;Electrical Stimulation    PT Next Visit Plan Manual Therapy for ROM/Pain relief as appropriate, Progressive LE strengthening and functional mobility training to normalize gait.    PT Home Exercise Plan 05/10/2021-Access Code: [email or web address]  URL: [email or web address]    Consulted and Agree with Plan of Care Patient             Patient will benefit from skilled therapeutic intervention in order to improve the following deficits and impairments:  Abnormal gait, Decreased activity tolerance, Decreased balance, Decreased endurance, Decreased knowledge of use of DME, Decreased mobility, Decreased strength, Decreased range of motion, Hypomobility, Increased muscle spasms, Impaired flexibility, Decreased skin integrity, Difficulty walking, Improper body mechanics, Pain  Visit Diagnosis: Abnormality of gait and mobility  Difficulty in walking, not elsewhere classified  Total knee replacement status, right  Other abnormalities of gait and mobility     Problem List Patient Active Problem List   Diagnosis Date Noted   OA (osteoarthritis) of knee 03/26/2021   Primary osteoarthritis of right knee  03/26/2021   Leukocytosis 04/19/2020   Thyroid cyst 02/26/2018   Plantar fasciitis 02/19/2016   Family history of breast cancer in sister 03/10/2015   Breast cancer of lower-outer quadrant of right female breast ([place]) 02/10/2015   Abdominal bloating 12/16/2013   Edema 11/09/2013   Urge incontinence 05/05/2013   Atrophic vaginitis 05/05/2013   Syncope 02/15/2013   Irritable bowel syndrome 08/20/2012   Hearing loss 11/07/2011   Generalized anxiety disorder 11/07/2011   Peripheral neuropathy 07/05/2011   Osteoporosis 07/05/2011   Tinnitus, bilateral 04/05/2011   HYPERCHOLESTEROLEMIA 04/05/2010   ANEMIA, IRON DEFICIENCY 04/03/2010   DEPRESSION, MAJOR, MODERATE 04/03/2010   Essential hypertension 04/03/2010   ALLERGIC RHINITIS 04/03/2010   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/03/2010   POSTMENOPAUSAL SYNDROME 04/03/2010   DEGENERATIVE JOINT DISEASE, CERVICAL SPINE 04/03/2010   FIBROMYALGIA 04/03/2010    Particia Lather, PT 05/31/2021, 12:13 PM  [place] MAIN [facility] SERVICES [address] [place], [place], [address] Phone: [phone]   Fax:  [phone]  Name: Shannon Hancock MRN: [id number] Date of Birth: 1950-12-10

## 2021-06-05 ENCOUNTER — Ambulatory Visit: Payer: Medicare PPO | Admitting: Physical Therapy

## 2021-06-05 ENCOUNTER — Other Ambulatory Visit: Payer: Self-pay

## 2021-06-05 DIAGNOSIS — M6281 Muscle weakness (generalized): Secondary | ICD-10-CM | POA: Diagnosis not present

## 2021-06-05 DIAGNOSIS — Z96651 Presence of right artificial knee joint: Secondary | ICD-10-CM

## 2021-06-05 DIAGNOSIS — R262 Difficulty in walking, not elsewhere classified: Secondary | ICD-10-CM

## 2021-06-05 DIAGNOSIS — R269 Unspecified abnormalities of gait and mobility: Secondary | ICD-10-CM | POA: Diagnosis not present

## 2021-06-05 DIAGNOSIS — R2689 Other abnormalities of gait and mobility: Secondary | ICD-10-CM | POA: Diagnosis not present

## 2021-06-05 NOTE — Therapy (Signed)
[place] [facility]IN [facility] [place] SERVICES [address] [place], [place], [address] Phone: [phone]   Fax:  [phone]  Physical Therapy Treatment  Patient Details  Name: Shannon Hancock MRN: [id number] Date of Birth: [birth year] Referring Provider (PT): [name] (Sports medicine @ [facility] [place])   Encounter Date: 06/05/2021   PT End of Session - 06/05/21 1057     Visit Number 19    Number of Visits 34    Date for PT Re-Evaluation 06/01/21    Authorization Type Auth through 12/2; progress note on 03/2, new cert 12/24-    Authorization - Number of Visits 34    Progress Note Due on Visit 20    PT Start Time 1054    PT Stop Time 1137    PT Time Calculation (min) 43 min    Equipment Utilized During Treatment Gait belt    Activity Tolerance Patient tolerated treatment well;Patient limited by pain    Behavior During Therapy [facility] for tasks assessed/performed             Past Medical History:  Diagnosis Date   Allergy    Anxiety    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; ER/PR+, Her2-, 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%   Depression    Diabetes mellitus without complication ([place])    Diverticulosis    Fibromyalgia    GERD (gastroesophageal reflux disease)    Heart murmur    "no murmur" documented in PCP note by [name], MD 02/09/21   High cholesterol    Hypertension    Joint pain     Past Surgical History:  Procedure Laterality Date   BREAST LUMPECTOMY Right 2016   BREAST LUMPECTOMY WITH RADIOACTIVE SEED AND SENTINEL LYMPH NODE BIOPSY Right 02/13/2015   Procedure: RIGHT BREAST LUMPECTOMY WITH RADIOACTIVE SEED AND SENTINEL LYMPH NODE MAPPING;  Surgeon: [name], MD;  Location: [facility];  Service: General;  Laterality: Right;   FOOT SURGERY Left    TOTAL KNEE ARTHROPLASTY Right 03/26/2021   Procedure: TOTAL KNEE ARTHROPLASTY;  Surgeon: [name], MD;  Location: [facility];  Service: Orthopedics;  Laterality:  Right;   TUBAL LIGATION      There were no vitals filed for this visit.   Subjective Assessment - 06/05/21 1055     Subjective Patient reports doing okay today. Pt reports taking some pain medicine earlier today so has no reports of pain at this time    Pertinent History Shannon Hancock is a 70 y.o. female presenting to physical therapy s/p R TKA 03/26/21. Pt reports to therapy amb with RW. Pt resides in a two story home with six steps to enter from the front and a ramp in the back. She reports negotiating stairs with assistance from her husband. She reports being able to negotiate and live on the main story of her home. Pt reports she would like to increase her R knee mobility, strength, and decrease her pain in order to walk without AD and go bowling. PMH includes breast cancer, fibromyalgia, DM, HTN, anxiety, depression, and hearing loss. Pt denies any unexplained weight fluctuation, saddle paresthesia, unrelenting night pain, or falls in the last 6 months.    Limitations Lifting;Walking;Standing;House hold activities    How long can you sit comfortably? 5-10 min    How long can you stand comfortably? 5 min    How long can you walk comfortably? 5-8 minutes    Currently in Pain? No/denies  Pain Score 0-No pain    Pain Onset More than a month ago                Exercise/Activity Sets/Reps/Time/ Resistance Assistance Charge type Comments  Nustep  Level 3, seat 8, 6 min   No Charge    STS 2x10 No UE There ACT Cues for standing tall to improve knee extension ROM and strength  Knee extension/ hamstring stretch at steps  3 x 30 sec   Therex  Cues for proper positioning and hold times   Step ups  2 x 12  B UE assist for balance  Theract To improve stair navigation and with cues for full knee extension on concentric portion   Hurdle step overs  2 x 10   There ACT -target of green pad to ensure heel strike as well as neutral hip alignment   Knee flexion and extension PROM with  overpressure at end range  X 10 ea PT performed  Manual -hard end feel with flexion   Quad sets  X 15   There ex  Supine with towel under heel   Knee extension overpressure 20 x with 30 sec overpressure   Manual  Supine with towel under heel  Min discomfort noted but pt does not report pain  Knee extension 6 degrees from full extension               Ice X4 min  No charge    Treatment Provided this session   Pt educated throughout session about proper posture and technique with exercises. Improved exercise technique, movement at target joints, use of target muscles after min to mod verbal, visual, tactile cues. Note: Portions of this document were prepared using Dragon voice recognition software and although reviewed may contain unintentional dictation errors in syntax, grammar, or spelling.                        PT Education - 06/05/21 1057     Education Details Exercise form ad technique    Person(s) Educated Patient    Methods Explanation    Comprehension Verbalized understanding;Returned demonstration              PT Short Term Goals - 05/31/21 1112       PT SHORT TERM GOAL #1   Title Pt will demonstrate independence with HEP to improve R LE function for increased ability to participate with ADLs    Baseline 11/3: pt asking for review of HEP, confident and independent with HEP, not completing on days when she has higher levele of pain    Time 4    Period Weeks    Status Achieved    Target Date 04/30/21               PT Long Term Goals - 05/29/21 1306       PT LONG TERM GOAL #1   Title Patient will increase FOTO score to 49 to demonstrate predicted increase in functional mobility to complete ADLs    Baseline 04/02/21: 1; 11/3: 29, 11/29: 44    Time 8    Period Weeks    Status On-going    Target Date 07/24/21      PT LONG TERM GOAL #2   Title Pt will perform 5XSTS in 12 seconds in order to demonstrate improved LE strength and decrease the  likelihood of falling.    Baseline 04/02/21: 25; 11/3: 11 seconds    Time  12    Period Weeks    Status Achieved      PT LONG TERM GOAL #3   Title Pt will actively demonstrate 0 deg R knee extension and 120 deg R knee flexion in order to safely negotiate steps and normalize gait without AD.    Baseline 04/02/21 R knee: flex 80 deg, ext: 50; 11/3 (PROM): flex 114*, ext 12* flex 116, ext 11    Time 8    Period Weeks    Status On-going    Target Date 07/24/21      PT LONG TERM GOAL #4   Title Pt will demonstrate SLS of RLE of at least 10sec to demonstrate decreased fall risk and PLOF of contralateral LE    Baseline 04/02/21 LLE 10sec RLE unable; 11/3: LLE 15 sec, RLE 11 sec    Time 8    Period Weeks    Status Achieved      PT LONG TERM GOAL #5   Title Pt will improve 6MWT to greater than 1000 feet in order to indicate improved community ambulation capacity.    Baseline 855 feet on 05/29/21    Time 8    Period Weeks    Status New    Target Date 07/24/21                   Plan - 06/05/21 1057     Clinical Impression Statement Pt presents with excellent motivation for completion of PT exercises. Patient able to obtain improved knee extension with various activities in standing as well as in supine compared to previous session. Patient also making progress with improving her gait pattern. Progress with several exercises in standing as described in note. Patient would he benefit from skilled physical therapy intervention in order to improve her lower extremity strength, gait pattern, knee pain, and overall function.    Personal Factors and Comorbidities Age;Comorbidity 2    Comorbidities DM, anxiety, depression    Examination-Activity Limitations Bed Mobility;Carry;Lift;Locomotion Level;Squat;Sit    Examination-Participation Restrictions Church;Cleaning;Community Activity;Laundry;Driving    Stability/Clinical Decision Making Evolving/Moderate complexity    Rehab Potential Good     PT Frequency 2x / week    PT Duration 12 weeks    PT Treatment/Interventions ADLs/Self Care Home Management;Gait training;Stair training;Functional mobility training;Therapeutic activities;Therapeutic exercise;Balance training;Patient/family education;Passive range of motion;Cryotherapy;Aquatic Therapy;Neuromuscular re-education;Manual techniques;Dry needling;Energy conservation;Scar mobilization;Electrical Stimulation    PT Next Visit Plan Manual Therapy for ROM/Pain relief as appropriate, Progressive LE strengthening and functional mobility training to normalize gait.    PT Home Exercise Plan 05/10/2021-Access Code: [email or web address]  URL: [email or web address]    Consulted and Agree with Plan of Care Patient             Patient will benefit from skilled therapeutic intervention in order to improve the following deficits and impairments:  Abnormal gait, Decreased activity tolerance, Decreased balance, Decreased endurance, Decreased knowledge of use of DME, Decreased mobility, Decreased strength, Decreased range of motion, Hypomobility, Increased muscle spasms, Impaired flexibility, Decreased skin integrity, Difficulty walking, Improper body mechanics, Pain  Visit Diagnosis: Abnormality of gait and mobility  Difficulty in walking, not elsewhere classified  Total knee replacement status, right  Other abnormalities of gait and mobility     Problem List Patient Active Problem List   Diagnosis Date Noted   OA (osteoarthritis) of knee 03/26/2021   Primary osteoarthritis of right knee 03/26/2021   Leukocytosis 04/19/2020   Thyroid cyst 02/26/2018   Plantar fasciitis 02/19/2016   Family history of breast  cancer in sister 03/10/2015   Breast cancer of lower-outer quadrant of right female breast ([place]) 02/10/2015   Abdominal bloating 12/16/2013   Edema 11/09/2013   Urge incontinence 05/05/2013   Atrophic vaginitis 05/05/2013   Syncope 02/15/2013   Irritable bowel syndrome  08/20/2012   Hearing loss 11/07/2011   Generalized anxiety disorder 11/07/2011   Peripheral neuropathy 07/05/2011   Osteoporosis 07/05/2011   Tinnitus, bilateral 04/05/2011   HYPERCHOLESTEROLEMIA 04/05/2010   ANEMIA, IRON DEFICIENCY 04/03/2010   DEPRESSION, MAJOR, MODERATE 04/03/2010   Essential hypertension 04/03/2010   ALLERGIC RHINITIS 04/03/2010   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/03/2010   POSTMENOPAUSAL SYNDROME 04/03/2010   DEGENERATIVE JOINT DISEASE, CERVICAL SPINE 04/03/2010   FIBROMYALGIA 04/03/2010    Particia Lather, PT 06/05/2021, 11:46 AM  [place] [address] [place], [place], [address] Phone: [phone]   Fax:  [phone]  Name: Shannon Hancock MRN: [id number] Date of Birth: May 05, 1951

## 2021-06-07 ENCOUNTER — Ambulatory Visit: Payer: Medicare PPO | Admitting: Physical Therapy

## 2021-06-07 ENCOUNTER — Other Ambulatory Visit: Payer: Self-pay

## 2021-06-07 DIAGNOSIS — M6281 Muscle weakness (generalized): Secondary | ICD-10-CM | POA: Diagnosis not present

## 2021-06-07 DIAGNOSIS — R262 Difficulty in walking, not elsewhere classified: Secondary | ICD-10-CM

## 2021-06-07 DIAGNOSIS — R2689 Other abnormalities of gait and mobility: Secondary | ICD-10-CM

## 2021-06-07 DIAGNOSIS — R269 Unspecified abnormalities of gait and mobility: Secondary | ICD-10-CM | POA: Diagnosis not present

## 2021-06-07 DIAGNOSIS — Z96651 Presence of right artificial knee joint: Secondary | ICD-10-CM

## 2021-06-07 NOTE — Therapy (Signed)
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; ER/PR+, Her2-, 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%   Depression    Diabetes mellitus without complication ([place])    Diverticulosis    Fibromyalgia    GERD (gastroesophageal reflux disease)    Heart murmur    "no murmur" documented in PCP note by [name], MD 02/09/21   High cholesterol    Hypertension    Joint pain     Past Surgical History:  Procedure Laterality Date   BREAST LUMPECTOMY Right 2016   BREAST LUMPECTOMY WITH RADIOACTIVE SEED AND SENTINEL LYMPH NODE BIOPSY Right 02/13/2015   Procedure: RIGHT BREAST LUMPECTOMY WITH RADIOACTIVE SEED AND SENTINEL LYMPH NODE MAPPING;  Surgeon: [name], MD;  Location: [place];  Service: General;  Laterality: Right;   FOOT SURGERY Left    TOTAL KNEE ARTHROPLASTY Right 03/26/2021   Procedure: TOTAL KNEE ARTHROPLASTY;  Surgeon: [name], MD;  [facility];  Service: Orthopedics;  Laterality: Right;   TUBAL LIGATION      There were no vitals filed for this visit.    Exercise/Activity Sets/Reps/Time/ Resistance Assistance Charge type Comments  Nustep  Level 3, seat 8, 6 min,   therex To knee mobility and ROM  -sues for cadence   STS 2x10 No UE There ACT Cues for standing tall to improve knee extension ROM and strength Cues for equal weight bearing and foot positioning         Ambulation in clinic with cues for proper gait pattern X 100 feet    TherACT -cues for proper heel strike and cadence as well as knee orientation   Hurdle step overs  X30    There ACT -target of green pad to ensure heel strike as well as neutral hip alignment   LAQ and knee extension  X 10 ea with 5 sec hold   therEX - assistance from contralateral LE for increased ROM with each   Quad sets  X 15   Therex  Supine with towel under heel   Knee extension overpressure 20 x with 30 sec overpressure   Manual  Supine with towel under heel  Min discomfort noted but pt does not report pain  Knee extension 6 degrees from full extension   PA joint mobs  X 30 ea   Manual  supine        Ice X4 min  No charge    Treatment Provided this session   Pt educated throughout session about proper posture and technique with exercises. Improved exercise technique, movement at target joints, use of target muscles after min to mod verbal, visual, tactile cues.  Note: Portions of this document were prepared using Dragon voice recognition software and although reviewed may contain unintentional dictation errors in syntax, grammar, or spelling.  PT Short Term Goals - 05/31/21 1112       PT SHORT TERM GOAL #1   Title Pt will demonstrate independence with HEP to improve R LE function for increased ability to participate with ADLs    Baseline 11/3: pt asking for review of HEP, confident and independent with HEP, not completing on days when she has higher levele of pain    Time 4    Period Weeks    Status Achieved    Target Date 04/30/21               PT Long Term Goals - 05/29/21 1306       PT LONG TERM GOAL #1   Title Patient will increase FOTO score to 49 to demonstrate predicted increase in functional mobility to complete ADLs    Baseline 04/02/21: 1; 11/3: 29, 11/29: 44    Time 8    Period Weeks    Status On-going    Target Date 07/24/21      PT LONG TERM GOAL #2   Title Pt will perform 5XSTS in 12 seconds in  order to demonstrate improved LE strength and decrease the likelihood of falling.    Baseline 04/02/21: 25; 11/3: 11 seconds    Time 12    Period Weeks    Status Achieved      PT LONG TERM GOAL #3   Title Pt will actively demonstrate 0 deg R knee extension and 120 deg R knee flexion in order to safely negotiate steps and normalize gait without AD.    Baseline 04/02/21 R knee: flex 80 deg, ext: 50; 11/3 (PROM): flex 114*, ext 12* flex 116, ext 11    Time 8    Period Weeks    Status On-going    Target Date 07/24/21      PT LONG TERM GOAL #4   Title Pt will demonstrate SLS of RLE of at least 10sec to demonstrate decreased fall risk and PLOF of contralateral LE    Baseline 04/02/21 LLE 10sec RLE unable; 11/3: LLE 15 sec, RLE 11 sec    Time 8    Period Weeks    Status Achieved      PT LONG TERM GOAL #5   Title Pt will improve 6MWT to greater than 1000 feet in order to indicate improved community ambulation capacity.    Baseline 855 feet on 05/29/21    Time 8    Period Weeks    Status New    Target Date 07/24/21                   Plan - 06/07/21 1259     Clinical Impression Statement Pt presents with excellent motivation for completion of PT exercises.  Patient making progress with improving her gait pattern but still has difficulty with carryover between sessions and consistently ambualtes with hip ER and knee flexed pattern of gait at start of PPT sessions. Pt was somewhat discouraged following yesterday's MD visit but was encuoraged to continue to work hard in order to progress her ROM and funciton.  Progress with several exercises in standing as described in note. Patient would he benefit from skilled physical therapy intervention in order to improve her lower extremity strength, gait pattern, knee pain, and overall function. Patient's condition has the potential to improve in response to therapy. Pt will continue to need PT in order to target knee extension gait pattern and  improving knee extension ROM. Several goals were addressed  and added during treatment session last week and progress toward these goals may be found in chart and in this note.  Maximum improvement is yet to be obtained. The anticipated improvement is attainable and reasonable in a generally predictable time.      Personal Factors and Comorbidities Age;Comorbidity 2    Comorbidities DM, anxiety, depression    Examination-Activity Limitations Bed Mobility;Carry;Lift;Locomotion Level;Squat;Sit    Examination-Participation Restrictions Church;Cleaning;Community Activity;Laundry;Driving    Stability/Clinical Decision Making Evolving/Moderate complexity    Rehab Potential Good    PT Frequency 2x / week    PT Duration 12 weeks    PT Treatment/Interventions ADLs/Self Care Home Management;Gait training;Stair training;Functional mobility training;Therapeutic activities;Therapeutic exercise;Balance training;Patient/family education;Passive range of motion;Cryotherapy;Aquatic Therapy;Neuromuscular re-education;Manual techniques;Dry needling;Energy conservation;Scar mobilization;Electrical Stimulation    PT Next Visit Plan Manual Therapy for ROM/Pain relief as appropriate, Progressive LE strengthening and functional mobility training to normalize gait.    PT Home Exercise Plan 05/10/2021-Access Code: [email or web address]  URL: [email or web address]    Consulted and Agree with Plan of Care Patient             Patient will benefit from skilled therapeutic intervention in order to improve the following deficits and impairments:  Abnormal gait, Decreased activity tolerance, Decreased balance, Decreased endurance, Decreased knowledge of use of DME, Decreased mobility, Decreased strength, Decreased range of motion, Hypomobility, Increased muscle spasms, Impaired flexibility, Decreased skin integrity, Difficulty walking, Improper body mechanics, Pain  Visit Diagnosis: Difficulty in walking, not elsewhere  classified  Abnormality of gait and mobility  Total knee replacement status, right  Other abnormalities of gait and mobility     Problem List Patient Active Problem List   Diagnosis Date Noted   OA (osteoarthritis) of knee 03/26/2021   Primary osteoarthritis of right knee 03/26/2021   Leukocytosis 04/19/2020   Thyroid cyst 02/26/2018   Plantar fasciitis 02/19/2016   Family history of breast cancer in sister 03/10/2015   Breast cancer of lower-outer quadrant of right female breast ([place]) 02/10/2015   Abdominal bloating 12/16/2013   Edema 11/09/2013   Urge incontinence 05/05/2013   Atrophic vaginitis 05/05/2013   Syncope 02/15/2013   Irritable bowel syndrome 08/20/2012   Hearing loss 11/07/2011   Generalized anxiety disorder 11/07/2011   Peripheral neuropathy 07/05/2011   Osteoporosis 07/05/2011   Tinnitus, bilateral 04/05/2011   HYPERCHOLESTEROLEMIA 04/05/2010   ANEMIA, IRON DEFICIENCY 04/03/2010   DEPRESSION, MAJOR, MODERATE 04/03/2010   Essential hypertension 04/03/2010   ALLERGIC RHINITIS 04/03/2010   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/03/2010   POSTMENOPAUSAL SYNDROME 04/03/2010   DEGENERATIVE JOINT DISEASE, CERVICAL SPINE 04/03/2010   FIBROMYALGIA 04/03/2010    Particia Lather, PT 06/07/2021, 1:03 PM  [place] MAIN [facility] SERVICES [address] [place], [place], [address] Phone: [phone]   Fax:  [phone]  Name: Shannon Hancock MRN: [id number] Date of Birth: 07-Jan-1951

## 2021-06-12 ENCOUNTER — Ambulatory Visit: Payer: Medicare PPO | Admitting: Physical Therapy

## 2021-06-12 ENCOUNTER — Other Ambulatory Visit: Payer: Self-pay

## 2021-06-12 DIAGNOSIS — R269 Unspecified abnormalities of gait and mobility: Secondary | ICD-10-CM | POA: Diagnosis not present

## 2021-06-12 DIAGNOSIS — R262 Difficulty in walking, not elsewhere classified: Secondary | ICD-10-CM

## 2021-06-12 DIAGNOSIS — R2689 Other abnormalities of gait and mobility: Secondary | ICD-10-CM | POA: Diagnosis not present

## 2021-06-12 DIAGNOSIS — Z96651 Presence of right artificial knee joint: Secondary | ICD-10-CM | POA: Diagnosis not present

## 2021-06-12 DIAGNOSIS — M6281 Muscle weakness (generalized): Secondary | ICD-10-CM | POA: Diagnosis not present

## 2021-06-12 NOTE — Therapy (Signed)
[place] °[facility] MA[facility] RE[facility] °[address] °[place], [place], [address] °Phone: [phone]   Fax:  [phone] ° °Physical Therapy Treatment ° °Patient Details  °Name: Shannon Hancock °MRN: [id number] °Date of Birth: 11/21/1950 °Referring Provider (PT): Adam Kendall MD (Sports medicine @ [facility]) ° ° °Encounter Date: 06/12/2021 ° ° PT End of Session - 06/12/21 1109   ° ° Visit Number 21   ° Number of Visits 34   ° Date for PT Re-Evaluation 06/01/21   ° Authorization Type Auth through 12/2; progress note on 11/3, new cert 11/29-   ° Authorization - Number of Visits 34   ° Progress Note Due on Visit 20   ° PT Start Time 1101   ° PT Stop Time 1144   ° PT Time Calculation (min) 43 min   ° Equipment Utilized During Treatment Gait belt   ° Activity Tolerance Patient tolerated treatment well;Patient limited by pain   ° Behavior During Therapy WFL for tasks assessed/performed   ° °  °  ° °  ° ° °Past Medical History:  °Diagnosis Date  ° Allergy   ° Anxiety   ° Breast cancer (HCC) 12/2014  ° IDC+DCIS of right breast; ER/PR+, Her2-, 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%  ° Depression   ° Diabetes mellitus without complication (HCC)   ° Diverticulosis   ° Fibromyalgia   ° GERD (gastroesophageal reflux disease)   ° Heart murmur   ° "no murmur" documented in PCP note by Cynthia White, MD 02/09/21  ° High cholesterol   ° Hypertension   ° Joint pain   ° ° °Past Surgical History:  °Procedure Laterality Date  ° BREAST LUMPECTOMY Right 2016  ° BREAST LUMPECTOMY WITH RADIOACTIVE SEED AND SENTINEL LYMPH NODE BIOPSY Right 02/13/2015  ° Procedure: RIGHT BREAST LUMPECTOMY WITH RADIOACTIVE SEED AND SENTINEL LYMPH NODE MAPPING;  Surgeon: Paul Toth III, MD;  Location: [facility];  Service: General;  Laterality: Right;  ° FOOT SURGERY Left   ° TOTAL KNEE ARTHROPLASTY Right 03/26/2021  ° Procedure: TOTAL KNEE ARTHROPLASTY;  Surgeon: Aluisio, Frank, MD;  Location: [facility];  Service: Orthopedics;  Laterality:  Right;  ° TUBAL LIGATION    ° ° °There were no vitals filed for this visit. ° ° Subjective Assessment - 06/12/21 1105   ° ° Subjective Patient reports doing better today, has had improvement in hip pain with focus on correcting gait pattern on the right.   ° Pertinent History Shannon Hancock is a 70 y.o. female presenting to physical therapy s/p R TKA 03/26/21. Pt reports to therapy amb with RW. Pt resides in a two story home with six steps to enter from the front and a ramp in the back. She reports negotiating stairs with assistance from her husband. She reports being able to negotiate and live on the main story of her home. Pt reports she would like to increase her R knee mobility, strength, and decrease her pain in order to walk without AD and go bowling. PMH includes breast cancer, fibromyalgia, DM, HTN, anxiety, depression, and hearing loss. Pt denies any unexplained weight fluctuation, saddle paresthesia, unrelenting night pain, or falls in the last 6 months.   ° Limitations Lifting;Walking;Standing;House hold activities   ° How long can you sit comfortably? 5-10 min   ° How long can you stand comfortably? 5 min   ° How long can you walk comfortably? 5-8 minutes   ° Currently in Pain? No/denies   ° Pain   Score 0-No pain   ° Pain Location Knee   ° Pain Orientation Right   ° Pain Descriptors / Indicators Aching   ° Pain Type Surgical pain   ° Pain Onset More than a month ago   ° Pain Frequency Constant   ° Aggravating Factors  standing, walking   ° Pain Relieving Factors meds, ice   ° Effect of Pain on Daily Activities limited   ° °  °  ° °  ° ° ° ° °. °Exercise/Activity Sets/Reps/Time/ Resistance Assistance Charge type Comments  °Nustep  Level 3, seat 8, 6 min,   therex To knee mobility and ROM  °-sues for cadence   °STS 2x10 No UE There ACT Cues for standing tall to improve knee extension ROM and strength °L LE ant to right to increase muscular demand and extension in the right knee upon standing   °Step ups 3  X 10  B UE on 2 sets  °U UE on 1 set TherACT Step over gait pattern practice with steps   °      °      °LAQ and knee flexion stretch  2 X 10 ea with 5 sec hold  2.5# therEX - assistance from contralateral LE for increased ROM with each   °Quad sets  X 15   Therex  Supine with towel under heel   °Knee extension overpressure 8 x with 30 sec overpressure   Manual  Supine with towel under heel  °Min discomfort noted but pt does not report pain  °Knee extension 6 degrees from full extension   °PA joint mobs  X 30 ea   Manual  Supine °Grade 3  °Hamstring and gastroc manual stretching  2 x 45 sec ea   Manual  -tightness noted in R hamstring and R gastroc   °Ice X4 min  No charge  Large Ice pack applied to R knee for pain management and to alleviate DOMS post session   °Treatment Provided this session  ° °Pt educated throughout session about proper posture and technique with exercises. Improved exercise technique, movement at target joints, use of target muscles after min to mod verbal, visual, tactile cues. ° °Note: Portions of this document were prepared using Dragon voice recognition software and although reviewed may contain unintentional dictation errors in syntax, grammar, or spelling. ° ° °Pt demonstrated improved gait pattern with less R hip external rotation  and improved knee extension in today's session.  ° ° ° ° ° ° ° ° ° ° ° ° ° ° ° ° ° ° ° ° ° ° ° ° PT Short Term Goals - 05/31/21 1112   ° °  ° PT SHORT TERM GOAL #1  ° Title Pt will demonstrate independence with HEP to improve R LE function for increased ability to participate with ADLs   ° Baseline 11/3: pt asking for review of HEP, confident and independent with HEP, not completing on days when she has higher levele of pain   ° Time 4   ° Period Weeks   ° Status Achieved   ° Target Date 04/30/21   ° °  °  ° °  ° ° ° ° PT Long Term Goals - 05/29/21 1306   ° °  ° PT LONG TERM GOAL #1  ° Title Patient will increase FOTO score to 49 to demonstrate predicted  increase in functional mobility to complete ADLs   ° Baseline 04/02/21: 1; 11/3: 29, 11/29: 44   °   Time 8    Period Weeks    Status On-going    Target Date 07/24/21      PT LONG TERM GOAL #2   Title Pt will perform 5XSTS in 12 seconds in order to demonstrate improved LE strength and decrease the likelihood of falling.    Baseline 04/02/21: 25; 11/3: 11 seconds    Time 12    Period Weeks    Status Achieved      PT LONG TERM GOAL #3   Title Pt will actively demonstrate 0 deg R knee extension and 120 deg R knee flexion in order to safely negotiate steps and normalize gait without AD.    Baseline 04/02/21 R knee: flex 80 deg, ext: 50; 11/3 (PROM): flex 114*, ext 12* flex 116, ext 11    Time 8    Period Weeks    Status On-going    Target Date 07/24/21      PT LONG TERM GOAL #4   Title Pt will demonstrate SLS of RLE of at least 10sec to demonstrate decreased fall risk and PLOF of contralateral LE    Baseline 04/02/21 LLE 10sec RLE unable; 11/3: LLE 15 sec, RLE 11 sec    Time 8    Period Weeks    Status Achieved      PT LONG TERM GOAL #5   Title Pt will improve 6MWT to greater than 1000 feet in order to indicate improved community ambulation capacity.    Baseline 855 feet on 05/29/21    Time 8    Period Weeks    Status New    Target Date 07/24/21                   Plan - 06/12/21 1119     Clinical Impression Statement Pt presents with excellent motivation for completion of PT exercises. Patient making progress with improving her gait pattern but still has difficulty with carryover between sessions and consistently ambualtes with hip ER and knee flexed pattern of gait at start of PPT sessions. Progress with several exercises in standing as described in note. Patient would he benefit from skilled physical therapy intervention in order to improve her lower extremity strength, gait pattern, knee pain, and overall function.    Personal Factors and Comorbidities Age;Comorbidity 2     Comorbidities DM, anxiety, depression    Examination-Activity Limitations Bed Mobility;Carry;Lift;Locomotion Level;Squat;Sit    Examination-Participation Restrictions Church;Cleaning;Community Activity;Laundry;Driving    Stability/Clinical Decision Making Evolving/Moderate complexity    Rehab Potential Good    PT Frequency 2x / week    PT Duration 12 weeks    PT Treatment/Interventions ADLs/Self Care Home Management;Gait training;Stair training;Functional mobility training;Therapeutic activities;Therapeutic exercise;Balance training;Patient/family education;Passive range of motion;Cryotherapy;Aquatic Therapy;Neuromuscular re-education;Manual techniques;Dry needling;Energy conservation;Scar mobilization;Electrical Stimulation    PT Next Visit Plan Manual Therapy for ROM/Pain relief as appropriate, Progressive LE strengthening and functional mobility training to normalize gait.    PT Home Exercise Plan 05/10/2021-Access Code: [email or web address]  URL: [email or web address]    Consulted and Agree with Plan of Care Patient             Patient will benefit from skilled therapeutic intervention in order to improve the following deficits and impairments:  Abnormal gait, Decreased activity tolerance, Decreased balance, Decreased endurance, Decreased knowledge of use of DME, Decreased mobility, Decreased strength, Decreased range of motion, Hypomobility, Increased muscle spasms, Impaired flexibility, Decreased skin integrity, Difficulty walking, Improper body mechanics, Pain  Visit Diagnosis: Difficulty in walking, not elsewhere classified  Abnormality  of gait and mobility  Total knee replacement status, right  Other abnormalities of gait and mobility     Problem List Patient Active Problem List   Diagnosis Date Noted   OA (osteoarthritis) of knee 03/26/2021   Primary osteoarthritis of right knee 03/26/2021   Leukocytosis 04/19/2020   Thyroid cyst 02/26/2018   Plantar fasciitis  02/19/2016   Family history of breast cancer in sister 03/10/2015   Breast cancer of lower-outer quadrant of right female breast ([place]) 02/10/2015   Abdominal bloating 12/16/2013   Edema 11/09/2013   Urge incontinence 05/05/2013   Atrophic vaginitis 05/05/2013   Syncope 02/15/2013   Irritable bowel syndrome 08/20/2012   Hearing loss 11/07/2011   Generalized anxiety disorder 11/07/2011   Peripheral neuropathy 07/05/2011   Osteoporosis 07/05/2011   Tinnitus, bilateral 04/05/2011   HYPERCHOLESTEROLEMIA 04/05/2010   ANEMIA, IRON DEFICIENCY 04/03/2010   DEPRESSION, MAJOR, MODERATE 04/03/2010   Essential hypertension 04/03/2010   ALLERGIC RHINITIS 04/03/2010   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/03/2010   POSTMENOPAUSAL SYNDROME 04/03/2010   DEGENERATIVE JOINT DISEASE, CERVICAL SPINE 04/03/2010   FIBROMYALGIA 04/03/2010    Particia Lather, PT 06/12/2021, 11:48 AM  [place] [address] [place], [place], [address] Phone: [phone]   Fax:  [phone]  Name: GARLAND SMOUSE MRN: [id number] Date of Birth: 24-May-1951

## 2021-06-14 ENCOUNTER — Ambulatory Visit: Payer: Medicare PPO | Admitting: Physical Therapy

## 2021-06-14 ENCOUNTER — Other Ambulatory Visit: Payer: Self-pay

## 2021-06-14 DIAGNOSIS — R2689 Other abnormalities of gait and mobility: Secondary | ICD-10-CM | POA: Diagnosis not present

## 2021-06-14 DIAGNOSIS — R262 Difficulty in walking, not elsewhere classified: Secondary | ICD-10-CM | POA: Diagnosis not present

## 2021-06-14 DIAGNOSIS — R269 Unspecified abnormalities of gait and mobility: Secondary | ICD-10-CM

## 2021-06-14 DIAGNOSIS — Z96651 Presence of right artificial knee joint: Secondary | ICD-10-CM

## 2021-06-14 DIAGNOSIS — M6281 Muscle weakness (generalized): Secondary | ICD-10-CM | POA: Diagnosis not present

## 2021-06-14 NOTE — Therapy (Signed)
[name] MAIN [facility] SERVICES [address] [place] [address], [place], [address] Phone: [phone]   Fax:  [phone]  Physical Therapy Treatment  Patient Details  Name: Shannon Hancock MRN: [id number] Date of Birth: 03/26/1951 Referring Provider (PT): [name] (Sports medicine @ [facility])   Encounter Date: 06/14/2021   PT End of Session - 06/14/21 1105     Visit Number 22    Number of Visits 34    Date for PT Re-Evaluation 06/01/21    Authorization Type Auth through 12/2; progress note on 62/2, new cert 63/33-    Authorization - Number of Visits 34    Progress Note Due on Visit 20    PT Start Time 1100    PT Stop Time 1144    PT Time Calculation (min) 44 min    Equipment Utilized During Treatment Gait belt    Activity Tolerance Patient tolerated treatment well;Patient limited by pain    Behavior During Therapy [facility] for tasks assessed/performed             Past Medical History:  Diagnosis Date   Allergy    Anxiety    Breast cancer ([place]) 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; ER/PR+, Her2-, 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%   Depression    Diabetes mellitus without complication ([place])    Diverticulosis    Fibromyalgia    GERD (gastroesophageal reflux disease)    Heart murmur    "no murmur" documented in PCP note by [name], MD 02/09/21   High cholesterol    Hypertension    Joint pain     Past Surgical History:  Procedure Laterality Date   BREAST LUMPECTOMY Right 2016   BREAST LUMPECTOMY WITH RADIOACTIVE SEED AND SENTINEL LYMPH NODE BIOPSY Right 02/13/2015   Procedure: RIGHT BREAST LUMPECTOMY WITH RADIOACTIVE SEED AND SENTINEL LYMPH NODE MAPPING;  Surgeon: [name], MD;  Location: [place];  Service: General;  Laterality: Right;   FOOT SURGERY Left    TOTAL KNEE ARTHROPLASTY Right 03/26/2021   Procedure: TOTAL KNEE ARTHROPLASTY;  Surgeon: [name], MD;  Location: [facility];  Service: Orthopedics;  Laterality:  Right;   TUBAL LIGATION      There were no vitals filed for this visit.   Subjective Assessment - 06/14/21 1102     Subjective Patient reports doing better today, has had improvement in hip pain with focus on correcting gait pattern on the right.    Pertinent History Shannon Hancock is a 70 y.o. female presenting to physical therapy s/p R TKA 03/26/21. Pt reports to therapy amb with RW. Pt resides in a two story home with six steps to enter from the front and a ramp in the back. She reports negotiating stairs with assistance from her husband. She reports being able to negotiate and live on the main story of her home. Pt reports she would like to increase her R knee mobility, strength, and decrease her pain in order to walk without AD and go bowling. PMH includes breast cancer, fibromyalgia, DM, HTN, anxiety, depression, and hearing loss. Pt denies any unexplained weight fluctuation, saddle paresthesia, unrelenting night pain, or falls in the last 6 months.    Limitations Lifting;Walking;Standing;House hold activities    How long can you sit comfortably? 5-10 min    How long can you stand comfortably? 5 min    How long can you walk comfortably? 5-8 minutes    Currently in Pain? No/denies    Pain  Score 0-No pain    Pain Onset More than a month ago                 Exercise/Activity Sets/Reps/Time/ Resistance Assistance Charge type Comments  Nustep  Level 3, seat 8, 6 min,   therex To knee mobility and ROM  -sues for cadence  -LE only   STS 2x10 No UE There ACT Cues for standing tall to improve knee extension ROM and strength L LE ant to right to increase muscular demand and extension in the right knee upon standing   Step ups 3 X 10  B UE on 2 sets  U UE on 1 set TherACT Step over gait pattern practice with steps   Knee extension stretch @ step (HS stretch) 2 x 30 sec  B UE for balance  therex To improve knee extension ROM   Leg press Bilateral  2 x 15 @ 40lb  therex To improve  glute and quad strength and with focus on terminal knee extension   LAQ and knee flexion stretch  2 X 10 ea with 5 sec hold  3# therEX - assistance from contralateral LE for increased ROM with each   Quad sets  X 15   Therex  Supine with towel under heel   Knee extension overpressure 8 x with 30 sec overpressure   Manual  Supine with towel under heel  Min discomfort noted but pt does not report pain  Knee extension 4 degrees from full extension   SLR 2 x 10  Therex Good quad activation noted, completed following manual   Hamstring and gastroc manual stretching  2 x 45 sec ea   Manual  -tightness noted in R hamstring and R gastroc   Ice X4 min  No charge  Large Ice pack applied to R knee for pain management and to alleviate DOMS post session   Treatment Provided this session   Pt educated throughout session about proper posture and technique with exercises. Improved exercise technique, movement at target joints, use of target muscles after min to mod verbal, visual, tactile cues.  Note: Portions of this document were prepared using Dragon voice recognition software and although reviewed may contain unintentional dictation errors in syntax, grammar, or spelling.   Pt demonstrated improved gait pattern with less R hip external rotation  and improved knee extension in today's session.                          PT Education - 06/14/21 1104     Education Details Exercise form and technique    Person(s) Educated Patient    Methods Explanation    Comprehension Verbalized understanding;Returned demonstration              PT Short Term Goals - 05/31/21 1112       PT SHORT TERM GOAL #1   Title Pt will demonstrate independence with HEP to improve R LE function for increased ability to participate with ADLs    Baseline 11/3: pt asking for review of HEP, confident and independent with HEP, not completing on days when she has higher levele of pain    Time 4    Period Weeks     Status Achieved    Target Date 04/30/21               PT Long Term Goals - 05/29/21 1306       PT LONG TERM GOAL #1  Title Patient will increase FOTO score to 49 to demonstrate predicted increase in functional mobility to complete ADLs    Baseline 04/02/21: 1; 11/3: 29, 11/29: 44    Time 8    Period Weeks    Status On-going    Target Date 07/24/21      PT LONG TERM GOAL #2   Title Pt will perform 5XSTS in 12 seconds in order to demonstrate improved LE strength and decrease the likelihood of falling.    Baseline 04/02/21: 25; 11/3: 11 seconds    Time 12    Period Weeks    Status Achieved      PT LONG TERM GOAL #3   Title Pt will actively demonstrate 0 deg R knee extension and 120 deg R knee flexion in order to safely negotiate steps and normalize gait without AD.    Baseline 04/02/21 R knee: flex 80 deg, ext: 50; 11/3 (PROM): flex 114*, ext 12* flex 116, ext 11    Time 8    Period Weeks    Status On-going    Target Date 07/24/21      PT LONG TERM GOAL #4   Title Pt will demonstrate SLS of RLE of at least 10sec to demonstrate decreased fall risk and PLOF of contralateral LE    Baseline 04/02/21 LLE 10sec RLE unable; 11/3: LLE 15 sec, RLE 11 sec    Time 8    Period Weeks    Status Achieved      PT LONG TERM GOAL #5   Title Pt will improve 6MWT to greater than 1000 feet in order to indicate improved community ambulation capacity.    Baseline 855 feet on 05/29/21    Time 8    Period Weeks    Status New    Target Date 07/24/21                   Plan - 06/14/21 1105     Clinical Impression Statement Pt presents with excellent motivation for completion of PT exercises and session. Patient making progress with improving her gait pattern and was better able to demonstrate proper gait pattern with less R hip ER in today session. She also reports this has helped with her contralateral hip pain. Pt made progress with ROM in today' session and was also able tolerate  leg press during today's session. Patient would he benefit from skilled physical therapy intervention in order to improve her lower extremity strength, gait pattern, knee pain, and overall function.    Personal Factors and Comorbidities Age;Comorbidity 2    Comorbidities DM, anxiety, depression    Examination-Activity Limitations Bed Mobility;Carry;Lift;Locomotion Level;Squat;Sit    Examination-Participation Restrictions Church;Cleaning;Community Activity;Laundry;Driving    Stability/Clinical Decision Making Evolving/Moderate complexity    Rehab Potential Good    PT Frequency 2x / week    PT Duration 12 weeks    PT Treatment/Interventions ADLs/Self Care Home Management;Gait training;Stair training;Functional mobility training;Therapeutic activities;Therapeutic exercise;Balance training;Patient/family education;Passive range of motion;Cryotherapy;Aquatic Therapy;Neuromuscular re-education;Manual techniques;Dry needling;Energy conservation;Scar mobilization;Electrical Stimulation    PT Next Visit Plan Manual Therapy for ROM/Pain relief as appropriate, Progressive LE strengthening and functional mobility training to normalize gait.    PT Home Exercise Plan 05/10/2021-Access Code: [email or web address]  URL: [email or web address]    Consulted and Agree with Plan of Care Patient             Patient will benefit from skilled therapeutic intervention in order to improve the following deficits and impairments:  Abnormal gait, Decreased activity tolerance,  Decreased balance, Decreased endurance, Decreased knowledge of use of DME, Decreased mobility, Decreased strength, Decreased range of motion, Hypomobility, Increased muscle spasms, Impaired flexibility, Decreased skin integrity, Difficulty walking, Improper body mechanics, Pain  Visit Diagnosis: Difficulty in walking, not elsewhere classified  Abnormality of gait and mobility  Total knee replacement status, right  Other abnormalities of gait  and mobility     Problem List Patient Active Problem List   Diagnosis Date Noted   OA (osteoarthritis) of knee 03/26/2021   Primary osteoarthritis of right knee 03/26/2021   Leukocytosis 04/19/2020   Thyroid cyst 02/26/2018   Plantar fasciitis 02/19/2016   Family history of breast cancer in sister 03/10/2015   Breast cancer of lower-outer quadrant of right female breast ([place]) 02/10/2015   Abdominal bloating 12/16/2013   Edema 11/09/2013   Urge incontinence 05/05/2013   Atrophic vaginitis 05/05/2013   Syncope 02/15/2013   Irritable bowel syndrome 08/20/2012   Hearing loss 11/07/2011   Generalized anxiety disorder 11/07/2011   Peripheral neuropathy 07/05/2011   Osteoporosis 07/05/2011   Tinnitus, bilateral 04/05/2011   HYPERCHOLESTEROLEMIA 04/05/2010   ANEMIA, IRON DEFICIENCY 04/03/2010   DEPRESSION, MAJOR, MODERATE 04/03/2010   Essential hypertension 04/03/2010   ALLERGIC RHINITIS 04/03/2010   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/03/2010   POSTMENOPAUSAL SYNDROME 04/03/2010   DEGENERATIVE JOINT DISEASE, CERVICAL SPINE 04/03/2010   FIBROMYALGIA 04/03/2010    Particia Lather, PT 06/14/2021, 11:49 AM  [place] [address] [place], [place], [address] Phone: [phone]   Fax:  [phone]  Name: SHAREE STURDY MRN: [id number] Date of Birth: 06-Jul-1950

## 2021-06-19 ENCOUNTER — Ambulatory Visit: Payer: Medicare PPO | Admitting: Physical Therapy

## 2021-06-19 ENCOUNTER — Other Ambulatory Visit: Payer: Self-pay

## 2021-06-19 DIAGNOSIS — M6281 Muscle weakness (generalized): Secondary | ICD-10-CM | POA: Diagnosis not present

## 2021-06-19 DIAGNOSIS — Z96651 Presence of right artificial knee joint: Secondary | ICD-10-CM | POA: Diagnosis not present

## 2021-06-19 DIAGNOSIS — R262 Difficulty in walking, not elsewhere classified: Secondary | ICD-10-CM | POA: Diagnosis not present

## 2021-06-19 DIAGNOSIS — R269 Unspecified abnormalities of gait and mobility: Secondary | ICD-10-CM

## 2021-06-19 DIAGNOSIS — R2689 Other abnormalities of gait and mobility: Secondary | ICD-10-CM | POA: Diagnosis not present

## 2021-06-19 NOTE — Therapy (Signed)
[place] MAIN [facility] SERVICES [address] [place], [place], [address] Phone: [phone]   Fax:  [phone]  Physical Therapy Treatment  Patient Details  Name: Shannon Hancock MRN: [id number] Date of Birth: September 21, 1950 Referring Provider (PT): [name] (Sports medicine @ [facility])   Encounter Date: 06/19/2021   PT End of Session - 06/19/21 1056     Visit Number 23    Number of Visits 34    Date for PT Re-Evaluation 07/24/21    Authorization Type Auth through 12/2; progress note on 62/7, new cert 03/50-0/93/81    Authorization - Number of Visits 34    Progress Note Due on Visit 30    PT Start Time 1051    PT Stop Time 1139    PT Time Calculation (min) 48 min    Equipment Utilized During Treatment Gait belt    Activity Tolerance Patient tolerated treatment well;Patient limited by pain    Behavior During Therapy [facility] for tasks assessed/performed             Past Medical History:  Diagnosis Date   Allergy    Anxiety    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; ER/PR+, Her2-, 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%   Depression    Diabetes mellitus without complication (HCC)    Diverticulosis    Fibromyalgia    GERD (gastroesophageal reflux disease)    Heart murmur    "no murmur" documented in PCP note by [name], MD 02/09/21   High cholesterol    Hypertension    Joint pain     Past Surgical History:  Procedure Laterality Date   BREAST LUMPECTOMY Right 2016   BREAST LUMPECTOMY WITH RADIOACTIVE SEED AND SENTINEL LYMPH NODE BIOPSY Right 02/13/2015   Procedure: RIGHT BREAST LUMPECTOMY WITH RADIOACTIVE SEED AND SENTINEL LYMPH NODE MAPPING;  Surgeon: [name], MD;  Location: [facility];  Service: General;  Laterality: Right;   FOOT SURGERY Left    TOTAL KNEE ARTHROPLASTY Right 03/26/2021   Procedure: TOTAL KNEE ARTHROPLASTY;  Surgeon: [name], MD;  Location: [facility];  Service: Orthopedics;   Laterality: Right;   TUBAL LIGATION      There were no vitals filed for this visit.   Subjective Assessment - 06/19/21 1054     Subjective Patient reports no significant changes sicne previous session.    Pertinent History Shannon Hancock is a 70 y.o. female presenting to physical therapy s/p R TKA 03/26/21. Pt reports to therapy amb with RW. Pt resides in a two story home with six steps to enter from the front and a ramp in the back. She reports negotiating stairs with assistance from her husband. She reports being able to negotiate and live on the main story of her home. Pt reports she would like to increase her R knee mobility, strength, and decrease her pain in order to walk without AD and go bowling. PMH includes breast cancer, fibromyalgia, DM, HTN, anxiety, depression, and hearing loss. Pt denies any unexplained weight fluctuation, saddle paresthesia, unrelenting night pain, or falls in the last 6 months.    Limitations Lifting;Walking;Standing;House hold activities    How long can you sit comfortably? 5-10 min    How long can you stand comfortably? 5 min    How long can you walk comfortably? 5-8 minutes    Pain Score 3     Pain Location Knee    Pain Orientation Right    Pain  Descriptors / Indicators Aching    Pain Type Surgical pain    Pain Onset More than a month ago    Pain Frequency Intermittent             Exercise/Activity Sets/Reps/Time/ Resistance Assistance Charge type Comments  Nustep  Level 3, seat 8, 6 min,   therex To knee mobility and ROM  -cues for cadence  -LE only   STS - staggered stance  2x10 No UE There ACT Cues for standing tall to improve knee extension ROM and strength L LE ant to right to increase muscular demand and extension in the right knee upon standing   Step ups 3 X 10  B UE on 2 sets  U UE on 1 set TherACT Step over gait pattern practice with steps   Knee extension stretch @ step (HS stretch) 2 x 30 sec  B UE for balance  therex To improve  knee extension ROM   Leg press Bilateral  2 x 15 @ 40lb  therex To improve glute and quad strength and with focus on terminal knee extension  Staggered stance to improve R LE muscle activation and knee extension at end range   LAQ and knee flexion stretch  x15 ea with 5 sec hold  3# therEX - assistance from contralateral LE for increased ROM with each   Quad sets  LAQ X 15 x 5 sec  X 10 x 5 sec w/3#AW   Therex  Supine with towel under heel  Bolster under knees   Knee extension overpressure 8 x with 30 sec overpressure   Manual  Supine with towel under heel  Min discomfort noted but pt does not report pain  Knee extension 4 degrees from full extension   SLR 2 x 10  Therex Good quad activation noted, completed following manual   Hamstring and gastroc manual stretching  2 x 45 sec ea   Manual  -tightness noted in R hamstring and R gastroc   Ice X4 min  No charge  Large Ice pack applied to R knee for pain management and to alleviate DOMS post session   Treatment Provided this session   Pt educated throughout session about proper posture and technique with exercises. Improved exercise technique, movement at target joints, use of target muscles after min to mod verbal, visual, tactile cues.  Note: Portions of this document were prepared using Dragon voice recognition software and although reviewed may contain unintentional dictation errors in syntax, grammar, or spelling.                               PT Short Term Goals - 05/31/21 1112       PT SHORT TERM GOAL #1   Title Pt will demonstrate independence with HEP to improve R LE function for increased ability to participate with ADLs    Baseline 11/3: pt asking for review of HEP, confident and independent with HEP, not completing on days when she has higher levele of pain    Time 4    Period Weeks    Status Achieved    Target Date 04/30/21               PT Long Term Goals - 05/29/21 1306       PT LONG  TERM GOAL #1   Title Patient will increase FOTO score to 49 to demonstrate predicted increase in functional mobility to complete ADLs  Baseline 04/02/21: 1; 11/3: 29, 11/29: 44    Time 8    Period Weeks    Status On-going    Target Date 07/24/21      PT LONG TERM GOAL #2   Title Pt will perform 5XSTS in 12 seconds in order to demonstrate improved LE strength and decrease the likelihood of falling.    Baseline 04/02/21: 25; 11/3: 11 seconds    Time 12    Period Weeks    Status Achieved      PT LONG TERM GOAL #3   Title Pt will actively demonstrate 0 deg R knee extension and 120 deg R knee flexion in order to safely negotiate steps and normalize gait without AD.    Baseline 04/02/21 R knee: flex 80 deg, ext: 50; 11/3 (PROM): flex 114*, ext 12* flex 116, ext 11    Time 8    Period Weeks    Status On-going    Target Date 07/24/21      PT LONG TERM GOAL #4   Title Pt will demonstrate SLS of RLE of at least 10sec to demonstrate decreased fall risk and PLOF of contralateral LE    Baseline 04/02/21 LLE 10sec RLE unable; 11/3: LLE 15 sec, RLE 11 sec    Time 8    Period Weeks    Status Achieved      PT LONG TERM GOAL #5   Title Pt will improve 6MWT to greater than 1000 feet in order to indicate improved community ambulation capacity.    Baseline 855 feet on 05/29/21    Time 8    Period Weeks    Status New    Target Date 07/24/21                   Plan - 06/19/21 1057     Clinical Impression Statement Pt presents with excellent motivation for completion of PT exercises and session. Patient making progress with improving her gait pattern and was better able to demonstrate proper gait pattern with less R hip ER in today session. She also reports this has helped with her contralateral hip pain. Pt made progress with ROM in today's session and was also able complete leg press during today's session. Patient would he benefit from skilled physical therapy intervention in order to  improve her lower extremity strength, gait pattern, knee pain, and overall function.    Personal Factors and Comorbidities Age;Comorbidity 2    Comorbidities DM, anxiety, depression    Examination-Activity Limitations Bed Mobility;Carry;Lift;Locomotion Level;Squat;Sit    Examination-Participation Restrictions Church;Cleaning;Community Activity;Laundry;Driving    Stability/Clinical Decision Making Evolving/Moderate complexity    Rehab Potential Good    PT Frequency 2x / week    PT Duration 12 weeks    PT Treatment/Interventions ADLs/Self Care Home Management;Gait training;Stair training;Functional mobility training;Therapeutic activities;Therapeutic exercise;Balance training;Patient/family education;Passive range of motion;Cryotherapy;Aquatic Therapy;Neuromuscular re-education;Manual techniques;Dry needling;Energy conservation;Scar mobilization;Electrical Stimulation    PT Next Visit Plan Manual Therapy for ROM/Pain relief as appropriate, Progressive LE strengthening and functional mobility training to normalize gait.    PT Home Exercise Plan 05/10/2021-Access Code: [email or web address]  URL: [email or web address]    Consulted and Agree with Plan of Care Patient             Patient will benefit from skilled therapeutic intervention in order to improve the following deficits and impairments:  Abnormal gait, Decreased activity tolerance, Decreased balance, Decreased endurance, Decreased knowledge of use of DME, Decreased mobility, Decreased strength, Decreased range of motion, Hypomobility, Increased muscle  spasms, Impaired flexibility, Decreased skin integrity, Difficulty walking, Improper body mechanics, Pain  Visit Diagnosis: Abnormality of gait and mobility  Total knee replacement status, right  Muscle weakness (generalized)  Difficulty in walking, not elsewhere classified     Problem List Patient Active Problem List   Diagnosis Date Noted   OA (osteoarthritis) of knee  03/26/2021   Primary osteoarthritis of right knee 03/26/2021   Leukocytosis 04/19/2020   Thyroid cyst 02/26/2018   Plantar fasciitis 02/19/2016   Family history of breast cancer in sister 03/10/2015   Breast cancer of lower-outer quadrant of right female breast ([place]) 02/10/2015   Abdominal bloating 12/16/2013   Edema 11/09/2013   Urge incontinence 05/05/2013   Atrophic vaginitis 05/05/2013   Syncope 02/15/2013   Irritable bowel syndrome 08/20/2012   Hearing loss 11/07/2011   Generalized anxiety disorder 11/07/2011   Peripheral neuropathy 07/05/2011   Osteoporosis 07/05/2011   Tinnitus, bilateral 04/05/2011   HYPERCHOLESTEROLEMIA 04/05/2010   ANEMIA, IRON DEFICIENCY 04/03/2010   DEPRESSION, MAJOR, MODERATE 04/03/2010   Essential hypertension 04/03/2010   ALLERGIC RHINITIS 04/03/2010   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/03/2010   POSTMENOPAUSAL SYNDROME 04/03/2010   DEGENERATIVE JOINT DISEASE, CERVICAL SPINE 04/03/2010   FIBROMYALGIA 04/03/2010    Particia Lather, PT 06/19/2021, 11:46 AM  [place] [address] [place], [place], [address] Phone: [phone]   Fax:  [phone]  Name: Shannon Hancock MRN: [id number] Date of Birth: [birth year]

## 2021-06-21 ENCOUNTER — Other Ambulatory Visit: Payer: Self-pay

## 2021-06-21 ENCOUNTER — Ambulatory Visit: Payer: Medicare PPO | Admitting: Physical Therapy

## 2021-06-21 DIAGNOSIS — Z96651 Presence of right artificial knee joint: Secondary | ICD-10-CM | POA: Diagnosis not present

## 2021-06-21 DIAGNOSIS — M6281 Muscle weakness (generalized): Secondary | ICD-10-CM | POA: Diagnosis not present

## 2021-06-21 DIAGNOSIS — R2689 Other abnormalities of gait and mobility: Secondary | ICD-10-CM

## 2021-06-21 DIAGNOSIS — R262 Difficulty in walking, not elsewhere classified: Secondary | ICD-10-CM

## 2021-06-21 DIAGNOSIS — R269 Unspecified abnormalities of gait and mobility: Secondary | ICD-10-CM | POA: Diagnosis not present

## 2021-06-21 NOTE — Therapy (Signed)
[place] MAIN [facility] SERVICES [address] [place], [place], [address] Phone: [phone]   Fax:  [phone]  Physical Therapy Treatment  Patient Details  Name: Shannon Hancock MRN: [id number] Date of Birth: 1950-10-24 Referring Provider (PT): [name] (Sports medicine @ [facility])   Encounter Date: 06/21/2021   PT End of Session - 06/21/21 1103     Visit Number 24    Number of Visits 34    Date for PT Re-Evaluation 07/24/21    Authorization Type Auth through 12/2; progress note on 53/2, new cert 02/33-4/35/68    Authorization - Number of Visits 34    Progress Note Due on Visit 30    PT Start Time 1100    PT Stop Time 1145    PT Time Calculation (min) 45 min    Equipment Utilized During Treatment Gait belt    Activity Tolerance Patient tolerated treatment well;Patient limited by pain    Behavior During Therapy [facility] for tasks assessed/performed             Past Medical History:  Diagnosis Date   Allergy    Anxiety    Breast cancer ([place]) 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; ER/PR+, Her2-, 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%   Depression    Diabetes mellitus without complication (HCC)    Diverticulosis    Fibromyalgia    GERD (gastroesophageal reflux disease)    Heart murmur    "no murmur" documented in PCP note by [name], MD 02/09/21   High cholesterol    Hypertension    Joint pain     Past Surgical History:  Procedure Laterality Date   BREAST LUMPECTOMY Right 2016   BREAST LUMPECTOMY WITH RADIOACTIVE SEED AND SENTINEL LYMPH NODE BIOPSY Right 02/13/2015   Procedure: RIGHT BREAST LUMPECTOMY WITH RADIOACTIVE SEED AND SENTINEL LYMPH NODE MAPPING;  Surgeon: [name], MD;  Location: [facility];  Service: General;  Laterality: Right;   FOOT SURGERY Left    TOTAL KNEE ARTHROPLASTY Right 03/26/2021   Procedure: TOTAL KNEE ARTHROPLASTY;  Surgeon: [name], MD;  Location[facility];  Service: Orthopedics;   Laterality: Right;   TUBAL LIGATION      There were no vitals filed for this visit.   Subjective Assessment - 06/21/21 1102     Subjective Patient reports no significant changes sicne previous session. Reports knee is feeling well today.    Pertinent History Shannon Hancock is a 70 y.o. female presenting to physical therapy s/p R TKA 03/26/21. Pt reports to therapy amb with RW. Pt resides in a two story home with six steps to enter from the front and a ramp in the back. She reports negotiating stairs with assistance from her husband. She reports being able to negotiate and live on the main story of her home. Pt reports she would like to increase her R knee mobility, strength, and decrease her pain in order to walk without AD and go bowling. PMH includes breast cancer, fibromyalgia, DM, HTN, anxiety, depression, and hearing loss. Pt denies any unexplained weight fluctuation, saddle paresthesia, unrelenting night pain, or falls in the last 6 months.    Limitations Lifting;Walking;Standing;House hold activities    How long can you sit comfortably? 5-10 min    How long can you stand comfortably? 5 min    How long can you walk comfortably? 5-8 minutes    Currently in Pain? No/denies    Pain Score 0-No pain  Pain Onset More than a month ago                Exercise/Activity Sets/Reps/Time/ Resistance Assistance Charge type Comments  Nustep  Level 3, seat 8, 6 min,   therex To knee mobility and ROM  -cues for cadence  -LE only   STS - staggered stance  2x10 No UE There ACT Cues for standing tall to improve knee extension ROM and strength L LE ant to right to increase muscular demand and extension in the right knee upon standing   Step ups 2 X 10  B UE on 2 sets  U UE on 1 set TherACT Step over gait pattern practice with steps  Cues for upright posture    Knee extension stretch @ step (HS stretch) 2 x 30 sec  B UE for balance  therex To improve knee extension ROM   Leg press Bilateral   3x10 @ 55lb  therex To improve glute and quad strength and with focus on terminal knee extension  Staggered stance to improve R LE muscle activation and knee extension at end range  (second and third set)   LAQ and knee flexion stretch  x15 ea with 5 sec hold  2.5# therEX - assistance from contralateral LE for increased ROM with each   LAQ X 10 x 5 sec w/2.5#AW   Therex  Bolster under knees   Knee extension overpressure 8 x with 30 sec overpressure   Manual  Supine  Min discomfort noted but pt does not report pain     SLR 2 x 10  Therex Good quad activation noted, completed following manual   Hamstring and gastroc manual stretching  2 x 45 sec ea   Manual  -tightness noted in R hamstring and R gastroc   Ice X4 min  No charge  Large Ice pack applied to R knee for pain management and to alleviate DOMS post session   Treatment Provided this session   Pt educated throughout session about proper posture and technique with exercises. Improved exercise technique, movement at target joints, use of target muscles after min to mod verbal, visual, tactile cues.  Note: Portions of this document were prepared using Dragon voice recognition software and although reviewed may contain unintentional dictation errors in syntax, grammar, or spelling.                            PT Education - 06/21/21 1102     Education Details Exxercise form and technique              PT Short Term Goals - 05/31/21 1112       PT SHORT TERM GOAL #1   Title Pt will demonstrate independence with HEP to improve R LE function for increased ability to participate with ADLs    Baseline 11/3: pt asking for review of HEP, confident and independent with HEP, not completing on days when she has higher levele of pain    Time 4    Period Weeks    Status Achieved    Target Date 04/30/21               PT Long Term Goals - 05/29/21 1306       PT LONG TERM GOAL #1   Title Patient will increase  FOTO score to 49 to demonstrate predicted increase in functional mobility to complete ADLs    Baseline 04/02/21: 1; 11/3: 29, 11/29:  44    Time 8    Period Weeks    Status On-going    Target Date 07/24/21      PT LONG TERM GOAL #2   Title Pt will perform 5XSTS in 12 seconds in order to demonstrate improved LE strength and decrease the likelihood of falling.    Baseline 04/02/21: 25; 11/3: 11 seconds    Time 12    Period Weeks    Status Achieved      PT LONG TERM GOAL #3   Title Pt will actively demonstrate 0 deg R knee extension and 120 deg R knee flexion in order to safely negotiate steps and normalize gait without AD.    Baseline 04/02/21 R knee: flex 80 deg, ext: 50; 11/3 (PROM): flex 114*, ext 12* flex 116, ext 11    Time 8    Period Weeks    Status On-going    Target Date 07/24/21      PT LONG TERM GOAL #4   Title Pt will demonstrate SLS of RLE of at least 10sec to demonstrate decreased fall risk and PLOF of contralateral LE    Baseline 04/02/21 LLE 10sec RLE unable; 11/3: LLE 15 sec, RLE 11 sec    Time 8    Period Weeks    Status Achieved      PT LONG TERM GOAL #5   Title Pt will improve 6MWT to greater than 1000 feet in order to indicate improved community ambulation capacity.    Baseline 855 feet on 05/29/21    Time 8    Period Weeks    Status New    Target Date 07/24/21                   Plan - 06/21/21 1104     Clinical Impression Statement Pt presents with excellent motivation for completion of PT exercises and session. Patient making progress with improving her gait pattern and was better able to demonstrate proper gait pattern with less R hip ER in today session. Pt made progress with ROM in today's session and was also able complete leg press during today's session. Patient would he benefit from skilled physical therapy intervention in order to improve her lower extremity strength, gait pattern, knee pain, and overall function.    Personal Factors and  Comorbidities Age;Comorbidity 2    Comorbidities DM, anxiety, depression    Examination-Activity Limitations Bed Mobility;Carry;Lift;Locomotion Level;Squat;Sit    Examination-Participation Restrictions Church;Cleaning;Community Activity;Laundry;Driving    Stability/Clinical Decision Making Evolving/Moderate complexity    Rehab Potential Good    PT Frequency 2x / week    PT Duration 12 weeks    PT Treatment/Interventions ADLs/Self Care Home Management;Gait training;Stair training;Functional mobility training;Therapeutic activities;Therapeutic exercise;Balance training;Patient/family education;Passive range of motion;Cryotherapy;Aquatic Therapy;Neuromuscular re-education;Manual techniques;Dry needling;Energy conservation;Scar mobilization;Electrical Stimulation    PT Next Visit Plan Manual Therapy for ROM/Pain relief as appropriate, Progressive LE strengthening and functional mobility training to normalize gait.    PT Home Exercise Plan 05/10/2021-Access Code: [email or web address]  URL: [email or web address]    Consulted and Agree with Plan of Care Patient             Patient will benefit from skilled therapeutic intervention in order to improve the following deficits and impairments:  Abnormal gait, Decreased activity tolerance, Decreased balance, Decreased endurance, Decreased knowledge of use of DME, Decreased mobility, Decreased strength, Decreased range of motion, Hypomobility, Increased muscle spasms, Impaired flexibility, Decreased skin integrity, Difficulty walking, Improper body mechanics, Pain  Visit Diagnosis: Abnormality of  gait and mobility  Total knee replacement status, right  Difficulty in walking, not elsewhere classified  Other abnormalities of gait and mobility     Problem List Patient Active Problem List   Diagnosis Date Noted   OA (osteoarthritis) of knee 03/26/2021   Primary osteoarthritis of right knee 03/26/2021   Leukocytosis 04/19/2020   Thyroid cyst  02/26/2018   Plantar fasciitis 02/19/2016   Family history of breast cancer in sister 03/10/2015   Breast cancer of lower-outer quadrant of right female breast ([place]) 02/10/2015   Abdominal bloating 12/16/2013   Edema 11/09/2013   Urge incontinence 05/05/2013   Atrophic vaginitis 05/05/2013   Syncope 02/15/2013   Irritable bowel syndrome 08/20/2012   Hearing loss 11/07/2011   Generalized anxiety disorder 11/07/2011   Peripheral neuropathy 07/05/2011   Osteoporosis 07/05/2011   Tinnitus, bilateral 04/05/2011   HYPERCHOLESTEROLEMIA 04/05/2010   ANEMIA, IRON DEFICIENCY 04/03/2010   DEPRESSION, MAJOR, MODERATE 04/03/2010   Essential hypertension 04/03/2010   ALLERGIC RHINITIS 04/03/2010   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/03/2010   POSTMENOPAUSAL SYNDROME 04/03/2010   DEGENERATIVE JOINT DISEASE, CERVICAL SPINE 04/03/2010   FIBROMYALGIA 04/03/2010    Particia Lather, PT 06/21/2021, 11:13 AM  [place] [address] [place], [place], [address] Phone: [phone]   Fax:  [phone]  Name: DANNAE KATO MRN: [id number] Date of Birth: Mar 14, 1951

## 2021-06-26 ENCOUNTER — Ambulatory Visit: Payer: Medicare PPO | Admitting: Physical Therapy

## 2021-06-26 ENCOUNTER — Other Ambulatory Visit: Payer: Self-pay

## 2021-06-26 DIAGNOSIS — R2689 Other abnormalities of gait and mobility: Secondary | ICD-10-CM | POA: Diagnosis not present

## 2021-06-26 DIAGNOSIS — R262 Difficulty in walking, not elsewhere classified: Secondary | ICD-10-CM

## 2021-06-26 DIAGNOSIS — Z96651 Presence of right artificial knee joint: Secondary | ICD-10-CM | POA: Diagnosis not present

## 2021-06-26 DIAGNOSIS — M6281 Muscle weakness (generalized): Secondary | ICD-10-CM | POA: Diagnosis not present

## 2021-06-26 DIAGNOSIS — R269 Unspecified abnormalities of gait and mobility: Secondary | ICD-10-CM | POA: Diagnosis not present

## 2021-06-26 NOTE — Therapy (Signed)
[place] MAIN [facility] SERVICES [address] [place], [place], [address] Phone: [phone]   Fax:  [phone]  Physical Therapy Treatment  Patient Details  Name: Shannon Hancock MRN: [id number] Date of Birth: [birth year] Referring Provider (PT): [name] (Sports medicine @ [facility])   Encounter Date: 06/26/2021   PT End of Session - 06/26/21 1253     Visit Number 26    Number of Visits 34    Date for PT Re-Evaluation 07/24/21    Authorization Type Auth through 12/2; progress note on 26/9, new cert 48/54-6/27/03    Authorization - Number of Visits 34    Progress Note Due on Visit 30    PT Start Time 1107    PT Stop Time 1149    PT Time Calculation (min) 42 min    Equipment Utilized During Treatment Gait belt    Activity Tolerance Patient tolerated treatment well;Patient limited by pain    Behavior During Therapy [facility] for tasks assessed/performed             Past Medical History:  Diagnosis Date   Allergy    Anxiety    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; ER/PR+, Her2-, 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%   Depression    Diabetes mellitus without complication (HCC)    Diverticulosis    Fibromyalgia    GERD (gastroesophageal reflux disease)    Heart murmur    "no murmur" documented in PCP note by [name], MD 02/09/21   High cholesterol    Hypertension    Joint pain     Past Surgical History:  Procedure Laterality Date   BREAST LUMPECTOMY Right 2016   BREAST LUMPECTOMY WITH RADIOACTIVE SEED AND SENTINEL LYMPH NODE BIOPSY Right 02/13/2015   Procedure: RIGHT BREAST LUMPECTOMY WITH RADIOACTIVE SEED AND SENTINEL LYMPH NODE MAPPING;  Surgeon: [name], MD;  Location: [place];  Service: General;  Laterality: Right;   FOOT SURGERY Left    TOTAL KNEE ARTHROPLASTY Right 03/26/2021   Procedure: TOTAL KNEE ARTHROPLASTY;  Surgeon: [name], MD;  Location: [facility];  Service: Orthopedics;   Laterality: Right;   TUBAL LIGATION      There were no vitals filed for this visit.   Subjective Assessment - 06/26/21 1114     Subjective pt reports knee was very sore over the weekend. Likely due to very cold weather in the area,    Pertinent History Shannon Hancock is a 70 y.o. female presenting to physical therapy s/p R TKA 03/26/21. Pt reports to therapy amb with RW. Pt resides in a two story home with six steps to enter from the front and a ramp in the back. She reports negotiating stairs with assistance from her husband. She reports being able to negotiate and live on the main story of her home. Pt reports she would like to increase her R knee mobility, strength, and decrease her pain in order to walk without AD and go bowling. PMH includes breast cancer, fibromyalgia, DM, HTN, anxiety, depression, and hearing loss. Pt denies any unexplained weight fluctuation, saddle paresthesia, unrelenting night pain, or falls in the last 6 months.    Limitations Lifting;Walking;Standing;House hold activities    How long can you sit comfortably? 5-10 min    How long can you stand comfortably? 5 min    How long can you walk comfortably? 5-8 minutes    Patient Stated Goals Returned and bowling and restore ROM  Currently in Pain? No/denies    Pain Score 0-No pain    Pain Location Knee    Pain Orientation Right    Pain Descriptors / Indicators Aching    Pain Type Surgical pain    Pain Onset More than a month ago    Pain Frequency Intermittent                Exercise/Activity Sets/Reps/Time/ Resistance Assistance Charge type Comments  Nustep  Level 3, seat 8, 6 min,   therex To knee mobility and ROM  -cues for cadence  -LE only   STS - staggered stance  2x10 No UE There ACT Cues for standing tall to improve knee extension ROM and strength L LE ant to right to increase muscular demand and extension in the right knee upon standing               Leg press Bilateral  3x10 @ 55lb  therex  To improve glute and quad strength and with focus on terminal knee extension  Staggered stance to improve R LE muscle activation and knee extension at end range  (second and third set)               Knee extension overpressure 8 x with 30 sec overpressure   Manual  Supine  Min discomfort noted but pt does not report pain           Hamstring amanual stretching  2 x 45 sec   Manual  -tightness noted in R hamstring and R gastroc   Ice X4 min  No charge  Large Ice pack applied to R knee for pain management and to alleviate DOMS post session   Biodex TM- gait trainer using UE support and gait belt-  Step length= _.49_ m RLE_.4_ m L Time on each foot _50_%R/_50_%L Gait velocity= .66 Total Time on TM = 48mn TherACT -rest break @ 2:30 [place] for 1 min    Pt educated throughout session about proper posture and technique with exercises. Improved exercise technique, movement at target joints, use of target muscles after min to mod verbal, visual, tactile cues.  Note: Portions of this document were prepared using Dragon voice recognition software and although reviewed may contain unintentional dictation errors in syntax, grammar, or spelling.                                PT Short Term Goals - 05/31/21 1112       PT SHORT TERM GOAL #1   Title Pt will demonstrate independence with HEP to improve R LE function for increased ability to participate with ADLs    Baseline 11/3: pt asking for review of HEP, confident and independent with HEP, not completing on days when she has higher levele of pain    Time 4    Period Weeks    Status Achieved    Target Date 04/30/21               PT Long Term Goals - 05/29/21 1306       PT LONG TERM GOAL #1   Title Patient will increase FOTO score to 49 to demonstrate predicted increase in functional mobility to complete ADLs    Baseline 04/02/21: 1; 11/3: 29, 11/29: 44    Time 8    Period Weeks    Status On-going    Target  Date 07/24/21      PT  LONG TERM GOAL #2   Title Pt will perform 5XSTS in 12 seconds in order to demonstrate improved LE strength and decrease the likelihood of falling.    Baseline 04/02/21: 25; 11/3: 11 seconds    Time 12    Period Weeks    Status Achieved      PT LONG TERM GOAL #3   Title Pt will actively demonstrate 0 deg R knee extension and 120 deg R knee flexion in order to safely negotiate steps and normalize gait without AD.    Baseline 04/02/21 R knee: flex 80 deg, ext: 50; 11/3 (PROM): flex 114*, ext 12* flex 116, ext 11    Time 8    Period Weeks    Status On-going    Target Date 07/24/21      PT LONG TERM GOAL #4   Title Pt will demonstrate SLS of RLE of at least 10sec to demonstrate decreased fall risk and PLOF of contralateral LE    Baseline 04/02/21 LLE 10sec RLE unable; 11/3: LLE 15 sec, RLE 11 sec    Time 8    Period Weeks    Status Achieved      PT LONG TERM GOAL #5   Title Pt will improve 6MWT to greater than 1000 feet in order to indicate improved community ambulation capacity.    Baseline 855 feet on 05/29/21    Time 8    Period Weeks    Status New    Target Date 07/24/21                   Plan - 06/26/21 1146     Clinical Impression Statement Pt presents with excellent motivation for completion of PT exercises and session.Pt had increased pain and discomfort with knee stretching activities this session but overall tolerated well. Pt progressed with resistance on leg press today and demonstrated good knee extension motion with this task. Patient would he benefit from skilled physical therapy intervention in order to improve her lower extremity strength, gait pattern, knee pain, and overall function.    Personal Factors and Comorbidities Age;Comorbidity 2    Comorbidities DM, anxiety, depression    Examination-Activity Limitations Bed Mobility;Carry;Lift;Locomotion Level;Squat;Sit    Examination-Participation Restrictions Church;Cleaning;Community  Activity;Laundry;Driving    Stability/Clinical Decision Making Evolving/Moderate complexity    Rehab Potential Good    PT Frequency 2x / week    PT Duration 12 weeks    PT Treatment/Interventions ADLs/Self Care Home Management;Gait training;Stair training;Functional mobility training;Therapeutic activities;Therapeutic exercise;Balance training;Patient/family education;Passive range of motion;Cryotherapy;Aquatic Therapy;Neuromuscular re-education;Manual techniques;Dry needling;Energy conservation;Scar mobilization;Electrical Stimulation    PT Next Visit Plan Manual Therapy for ROM/Pain relief as appropriate, Progressive LE strengthening and functional mobility training to normalize gait.    PT Home Exercise Plan 05/10/2021-Access Code: [email or web address]  URL: [email or web address]    Consulted and Agree with Plan of Care Patient             Patient will benefit from skilled therapeutic intervention in order to improve the following deficits and impairments:  Abnormal gait, Decreased activity tolerance, Decreased balance, Decreased endurance, Decreased knowledge of use of DME, Decreased mobility, Decreased strength, Decreased range of motion, Hypomobility, Increased muscle spasms, Impaired flexibility, Decreased skin integrity, Difficulty walking, Improper body mechanics, Pain  Visit Diagnosis: Abnormality of gait and mobility  Total knee replacement status, right  Difficulty in walking, not elsewhere classified  Other abnormalities of gait and mobility     Problem List Patient Active Problem List   Diagnosis Date Noted  OA (osteoarthritis) of knee 03/26/2021   Primary osteoarthritis of right knee 03/26/2021   Leukocytosis 04/19/2020   Thyroid cyst 02/26/2018   Plantar fasciitis 02/19/2016   Family history of breast cancer in sister 03/10/2015   Breast cancer of lower-outer quadrant of right female breast ([place]) 02/10/2015   Abdominal bloating 12/16/2013   Edema 11/09/2013    Urge incontinence 05/05/2013   Atrophic vaginitis 05/05/2013   Syncope 02/15/2013   Irritable bowel syndrome 08/20/2012   Hearing loss 11/07/2011   Generalized anxiety disorder 11/07/2011   Peripheral neuropathy 07/05/2011   Osteoporosis 07/05/2011   Tinnitus, bilateral 04/05/2011   HYPERCHOLESTEROLEMIA 04/05/2010   ANEMIA, IRON DEFICIENCY 04/03/2010   DEPRESSION, MAJOR, MODERATE 04/03/2010   Essential hypertension 04/03/2010   ALLERGIC RHINITIS 04/03/2010   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/03/2010   POSTMENOPAUSAL SYNDROME 04/03/2010   DEGENERATIVE JOINT DISEASE, CERVICAL SPINE 04/03/2010   FIBROMYALGIA 04/03/2010    Particia Lather, PT 06/26/2021, 12:55 PM  [place] MAIN [facility] SERVICES [address] [place], [place], [address] Phone: [phone]   Fax:  [phone]  Name: Shannon Hancock MRN: [id number] Date of Birth: [birth year]

## 2021-06-28 ENCOUNTER — Other Ambulatory Visit: Payer: Self-pay

## 2021-06-28 ENCOUNTER — Ambulatory Visit: Payer: Medicare PPO | Admitting: Physical Therapy

## 2021-06-28 DIAGNOSIS — R269 Unspecified abnormalities of gait and mobility: Secondary | ICD-10-CM

## 2021-06-28 DIAGNOSIS — R262 Difficulty in walking, not elsewhere classified: Secondary | ICD-10-CM | POA: Diagnosis not present

## 2021-06-28 DIAGNOSIS — R2689 Other abnormalities of gait and mobility: Secondary | ICD-10-CM | POA: Diagnosis not present

## 2021-06-28 DIAGNOSIS — Z96651 Presence of right artificial knee joint: Secondary | ICD-10-CM

## 2021-06-28 DIAGNOSIS — M6281 Muscle weakness (generalized): Secondary | ICD-10-CM | POA: Diagnosis not present

## 2021-06-28 NOTE — Therapy (Signed)
[place] MAIN [facility] SERVICES [address] [place], [place], [address] Phone: [phone]   Fax:  [phone]  Physical Therapy Treatment  Patient Details  Name: Shannon Hancock MRN: [id number] Date of Birth: 22-Nov-1950 Referring Provider (PT): [name] (Sports medicine @ [facility] [place])   Encounter Date: 06/28/2021   PT End of Session - 06/28/21 1107     Visit Number 26    Number of Visits 34    Date for PT Re-Evaluation 07/24/21    Authorization Type Auth through 12/2; progress note on 01/7, new cert 51/02-5/85/27    Authorization - Number of Visits 34    Progress Note Due on Visit 30    PT Start Time 1102    PT Stop Time 1145    PT Time Calculation (min) 43 min    Equipment Utilized During Treatment Gait belt    Activity Tolerance Patient tolerated treatment well;Patient limited by pain    Behavior During Therapy [facility] for tasks assessed/performed             Past Medical History:  Diagnosis Date   Allergy    Anxiety    Breast cancer ([place]) 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; ER/PR+, Her2-, 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%   Depression    Diabetes mellitus without complication (HCC)    Diverticulosis    Fibromyalgia    GERD (gastroesophageal reflux disease)    Heart murmur    "no murmur" documented in PCP note by [name], MD 02/09/21   High cholesterol    Hypertension    Joint pain     Past Surgical History:  Procedure Laterality Date   BREAST LUMPECTOMY Right 2016   BREAST LUMPECTOMY WITH RADIOACTIVE SEED AND SENTINEL LYMPH NODE BIOPSY Right 02/13/2015   Procedure: RIGHT BREAST LUMPECTOMY WITH RADIOACTIVE SEED AND SENTINEL LYMPH NODE MAPPING;  Surgeon: [name], MD;  Location: [place];  Service: General;  Laterality: Right;   FOOT SURGERY Left    TOTAL KNEE ARTHROPLASTY Right 03/26/2021   Procedure: TOTAL KNEE ARTHROPLASTY;  Surgeon: [name], MD;  [facility];  Service: Orthopedics;   Laterality: Right;   TUBAL LIGATION      There were no vitals filed for this visit.   Subjective Assessment - 06/28/21 1105     Subjective Pt reports she is feeling well and has no complaints of knee pain. Still reports knee stiffness.    Pertinent History Shannon Hancock is a 70 y.o. female presenting to physical therapy s/p R TKA 03/26/21. Pt reports to therapy amb with RW. Pt resides in a two story home with six steps to enter from the front and a ramp in the back. She reports negotiating stairs with assistance from her husband. She reports being able to negotiate and live on the main story of her home. Pt reports she would like to increase her R knee mobility, strength, and decrease her pain in order to walk without AD and go bowling. PMH includes breast cancer, fibromyalgia, DM, HTN, anxiety, depression, and hearing loss. Pt denies any unexplained weight fluctuation, saddle paresthesia, unrelenting night pain, or falls in the last 6 months.    Limitations Lifting;Walking;Standing;House hold activities    How long can you sit comfortably? 5-10 min    How long can you stand comfortably? 5 min    How long can you walk comfortably? 5-8 minutes    Patient Stated Goals Returned and bowling and restore ROM  Pain Onset More than a month ago             Exercise/Activity Sets/Reps/Time/ Resistance Assistance Charge type Comments  Nustep  Level 3, seat 8, 6 min,   therex To knee mobility and ROM  -cues for cadence  -LE only   STS - staggered stance  2x10 No UE There ACT Cues for standing tall to improve knee extension ROM and strength L LE ant to right to increase muscular demand and extension in the right knee upon standing         Knee extension stretch @ step (HS stretch) 2 x 45 sec  B UE for balance  therex To improve knee extension ROM   Leg press Bilateral  2x10 @ 55lb 1x8 @ 70#  therex To improve glute and quad strength and with focus on terminal knee extension  Staggered stance  to improve R LE muscle activation and knee extension at end range  (second and third set)   LAQ and knee flexion stretch  x15 ea with 5 sec hold  2.5# therEX - assistance from contralateral LE for increased ROM with each         Knee extension overpressure 8 x with 30 sec overpressure   Manual  Supine  Min discomfort noted but pt does not report pain     SLR 2 x 10  Therex Good quad activation noted, completed following manual, cues for maintaining full knee extension  Hamstring and gastroc manual stretching  2 x 45 sec ea   Manual  -tightness noted in R hamstring and R gastroc   Ice X4 min  No charge  Large Ice pack applied to R knee for pain management and to alleviate DOMS post session   Biodex TM- gait trainer using UE support and gait belt-  Step length= _.47_ m RLE_.43_ m L Time on each foot _49_%R/_51_%L Gait velocity= .65 Total Time on TM = 58mn TherACT  Pt educated throughout session about proper posture and technique with exercises. Improved exercise technique, movement at target joints, use of target muscles after min to mod verbal, visual, tactile cues.  Note: Portions of this document were prepared using Dragon voice recognition software and although reviewed may contain unintentional dictation errors in syntax, grammar, or spelling.                              PT Education - 06/28/21 1252     Education Details Gait mechanics    Person(s) Educated Patient    Methods Explanation    Comprehension Verbalized understanding              PT Short Term Goals - 05/31/21 1112       PT SHORT TERM GOAL #1   Title Pt will demonstrate independence with HEP to improve R LE function for increased ability to participate with ADLs    Baseline 11/3: pt asking for review of HEP, confident and independent with HEP, not completing on days when she has higher levele of pain    Time 4    Period Weeks    Status Achieved    Target Date 04/30/21                PT Long Term Goals - 05/29/21 1306       PT LONG TERM GOAL #1   Title Patient will increase FOTO score to 49 to demonstrate predicted increase in functional  mobility to complete ADLs    Baseline 04/02/21: 1; 11/3: 29, 11/29: 44    Time 8    Period Weeks    Status On-going    Target Date 07/24/21      PT LONG TERM GOAL #2   Title Pt will perform 5XSTS in 12 seconds in order to demonstrate improved LE strength and decrease the likelihood of falling.    Baseline 04/02/21: 25; 11/3: 11 seconds    Time 12    Period Weeks    Status Achieved      PT LONG TERM GOAL #3   Title Pt will actively demonstrate 0 deg R knee extension and 120 deg R knee flexion in order to safely negotiate steps and normalize gait without AD.    Baseline 04/02/21 R knee: flex 80 deg, ext: 50; 11/3 (PROM): flex 114*, ext 12* flex 116, ext 11    Time 8    Period Weeks    Status On-going    Target Date 07/24/21      PT LONG TERM GOAL #4   Title Pt will demonstrate SLS of RLE of at least 10sec to demonstrate decreased fall risk and PLOF of contralateral LE    Baseline 04/02/21 LLE 10sec RLE unable; 11/3: LLE 15 sec, RLE 11 sec    Time 8    Period Weeks    Status Achieved      PT LONG TERM GOAL #5   Title Pt will improve 6MWT to greater than 1000 feet in order to indicate improved community ambulation capacity.    Baseline 855 feet on 05/29/21    Time 8    Period Weeks    Status New    Target Date 07/24/21                   Plan - 06/28/21 1127     Clinical Impression Statement Pt presents with excellent motivation for completion of PT exercises and session.Pt had increased pain and discomfort with knee stretching activities this session but overall tolerated well. Pt progressed with resistance on leg press today. Pt continues to lack terminal knee extension with ambulation but gait pettern has improved.  Patient will benefit from skilled physical therapy intervention in order to improve her  lower extremity strength, gait pattern, knee pain, and overall function.    Personal Factors and Comorbidities Age;Comorbidity 2    Comorbidities DM, anxiety, depression    Examination-Activity Limitations Bed Mobility;Carry;Lift;Locomotion Level;Squat;Sit    Examination-Participation Restrictions Church;Cleaning;Community Activity;Laundry;Driving    Stability/Clinical Decision Making Evolving/Moderate complexity    Rehab Potential Good    PT Frequency 2x / week    PT Duration 12 weeks    PT Treatment/Interventions ADLs/Self Care Home Management;Gait training;Stair training;Functional mobility training;Therapeutic activities;Therapeutic exercise;Balance training;Patient/family education;Passive range of motion;Cryotherapy;Aquatic Therapy;Neuromuscular re-education;Manual techniques;Dry needling;Energy conservation;Scar mobilization;Electrical Stimulation    PT Next Visit Plan Manual Therapy for ROM/Pain relief as appropriate, Progressive LE strengthening and functional mobility training to normalize gait.    PT Home Exercise Plan 05/10/2021-Access Code: [email or web address]  URL: [email or web address]    Consulted and Agree with Plan of Care Patient             Patient will benefit from skilled therapeutic intervention in order to improve the following deficits and impairments:  Abnormal gait, Decreased activity tolerance, Decreased balance, Decreased endurance, Decreased knowledge of use of DME, Decreased mobility, Decreased strength, Decreased range of motion, Hypomobility, Increased muscle spasms, Impaired flexibility, Decreased skin integrity, Difficulty walking, Improper body  mechanics, Pain  Visit Diagnosis: Abnormality of gait and mobility  Total knee replacement status, right  Difficulty in walking, not elsewhere classified  Other abnormalities of gait and mobility     Problem List Patient Active Problem List   Diagnosis Date Noted   OA (osteoarthritis) of knee  03/26/2021   Primary osteoarthritis of right knee 03/26/2021   Leukocytosis 04/19/2020   Thyroid cyst 02/26/2018   Plantar fasciitis 02/19/2016   Family history of breast cancer in sister 03/10/2015   Breast cancer of lower-outer quadrant of right female breast ([place]) 02/10/2015   Abdominal bloating 12/16/2013   Edema 11/09/2013   Urge incontinence 05/05/2013   Atrophic vaginitis 05/05/2013   Syncope 02/15/2013   Irritable bowel syndrome 08/20/2012   Hearing loss 11/07/2011   Generalized anxiety disorder 11/07/2011   Peripheral neuropathy 07/05/2011   Osteoporosis 07/05/2011   Tinnitus, bilateral 04/05/2011   HYPERCHOLESTEROLEMIA 04/05/2010   ANEMIA, IRON DEFICIENCY 04/03/2010   DEPRESSION, MAJOR, MODERATE 04/03/2010   Essential hypertension 04/03/2010   ALLERGIC RHINITIS 04/03/2010   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/03/2010   POSTMENOPAUSAL SYNDROME 04/03/2010   DEGENERATIVE JOINT DISEASE, CERVICAL SPINE 04/03/2010   FIBROMYALGIA 04/03/2010    Particia Lather, PT 06/28/2021, 12:55 PM  [place] MAIN [facility] SERVICES [address] [place], [place], [address] Phone: [phone]   Fax:  [phone]  Name: Shannon Hancock MRN: [id number] Date of Birth: Apr 04, 1951

## 2021-07-03 ENCOUNTER — Other Ambulatory Visit: Payer: Self-pay

## 2021-07-03 ENCOUNTER — Ambulatory Visit: Payer: Medicare PPO | Attending: Sports Medicine

## 2021-07-03 DIAGNOSIS — R2689 Other abnormalities of gait and mobility: Secondary | ICD-10-CM | POA: Diagnosis not present

## 2021-07-03 DIAGNOSIS — R262 Difficulty in walking, not elsewhere classified: Secondary | ICD-10-CM | POA: Insufficient documentation

## 2021-07-03 DIAGNOSIS — Z96651 Presence of right artificial knee joint: Secondary | ICD-10-CM | POA: Diagnosis not present

## 2021-07-03 DIAGNOSIS — M6281 Muscle weakness (generalized): Secondary | ICD-10-CM | POA: Insufficient documentation

## 2021-07-03 DIAGNOSIS — M25562 Pain in left knee: Secondary | ICD-10-CM | POA: Diagnosis present

## 2021-07-03 DIAGNOSIS — G8929 Other chronic pain: Secondary | ICD-10-CM | POA: Diagnosis not present

## 2021-07-03 DIAGNOSIS — R2681 Unsteadiness on feet: Secondary | ICD-10-CM | POA: Insufficient documentation

## 2021-07-03 DIAGNOSIS — M25561 Pain in right knee: Secondary | ICD-10-CM | POA: Insufficient documentation

## 2021-07-03 DIAGNOSIS — R278 Other lack of coordination: Secondary | ICD-10-CM | POA: Diagnosis not present

## 2021-07-03 DIAGNOSIS — R269 Unspecified abnormalities of gait and mobility: Secondary | ICD-10-CM | POA: Diagnosis not present

## 2021-07-03 NOTE — Therapy (Signed)
[place] MAIN [facility] SERVICES [address] [place], [place], [address] Phone: [phone]   Fax:  [phone]  Physical Therapy Treatment  Patient Details  Name: Shannon Hancock MRN: [id number] Date of Birth: [birth year] Referring Provider (PT): [name] (Sports medicine @ [facility])   Encounter Date: 07/03/2021   PT End of Session - 07/03/21 2111     Visit Number 27    Number of Visits 34    Date for PT Re-Evaluation 07/24/21    Authorization Type Auth through 12/2; progress note on 53/9, new cert 76/73-4/19/37    Authorization - Number of Visits 34    Progress Note Due on Visit 30    PT Start Time 1016    PT Stop Time 1100    PT Time Calculation (min) 44 min    Equipment Utilized During Treatment Gait belt    Activity Tolerance Patient tolerated treatment well    Behavior During Therapy [facility] for tasks assessed/performed             Past Medical History:  Diagnosis Date   Allergy    Anxiety    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; ER/PR+, Her2-, 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%   Depression    Diabetes mellitus without complication (HCC)    Diverticulosis    Fibromyalgia    GERD (gastroesophageal reflux disease)    Heart murmur    "no murmur" documented in PCP note by [name], MD 02/09/21   High cholesterol    Hypertension    Joint pain     Past Surgical History:  Procedure Laterality Date   BREAST LUMPECTOMY Right 2016   BREAST LUMPECTOMY WITH RADIOACTIVE SEED AND SENTINEL LYMPH NODE BIOPSY Right 02/13/2015   Procedure: RIGHT BREAST LUMPECTOMY WITH RADIOACTIVE SEED AND SENTINEL LYMPH NODE MAPPING;  Surgeon: [name], MD;  Location: [place];  Service: General;  Laterality: Right;   FOOT SURGERY Left    TOTAL KNEE ARTHROPLASTY Right 03/26/2021   Procedure: TOTAL KNEE ARTHROPLASTY;  Surgeon: [name], MD;  Location: [facility];  Service: Orthopedics;  Laterality: Right;   TUBAL  LIGATION      There were no vitals filed for this visit.   Subjective Assessment - 07/03/21 2057     Subjective Pt reports she is feeling well but still reports continued joint swelling and knee stiffness.    Pertinent History Shannon Hancock is a 71 y.o. female presenting to physical therapy s/p R TKA 03/26/21. Pt reports to therapy amb with RW. Pt resides in a two story home with six steps to enter from the front and a ramp in the back. She reports negotiating stairs with assistance from her husband. She reports being able to negotiate and live on the main story of her home. Pt reports she would like to increase her R knee mobility, strength, and decrease her pain in order to walk without AD and go bowling. PMH includes breast cancer, fibromyalgia, DM, HTN, anxiety, depression, and hearing loss. Pt denies any unexplained weight fluctuation, saddle paresthesia, unrelenting night pain, or falls in the last 6 months.    Limitations Lifting;Walking;Standing;House hold activities    How long can you sit comfortably? 5-10 min    How long can you stand comfortably? 5 min    How long can you walk comfortably? 5-8 minutes    Patient Stated Goals Returned and bowling and restore ROM    Currently in Pain? Yes  Pain Score 2     Pain Location Knee    Pain Orientation Right    Pain Descriptors / Indicators Aching;Tightness    Pain Type Surgical pain    Pain Onset More than a month ago    Pain Frequency Intermittent    Aggravating Factors  Standing, walking    Pain Relieving Factors Meds, Ice    Effect of Pain on Daily Activities Difficulty walking               Comments      Nustep  Level 3, seat 8, 6 min,    therex To assist with knee flex/ext ROM  -cues for SPM  -LE only  RPE= 4-6  STS - staggered stance  2x10 No UE There ACT Cues for standing tall to improve knee extension ROM and strength L LE ant to right to increase muscular demand and extension in the right knee upon standing               Knee extension stretch @ step (HS stretch) 4 x 30 sec  B UE for balance  therex To improve knee extension ROM   Leg press Bilateral  2x10 @ 55lb 1x8 @ 70#   therex To improve glute and quad strength and with focus on terminal knee extension  Staggered stance to improve R LE muscle activation and knee extension at end range  (second and third set)   LAQ and knee flexion stretch  x15 ea with 5 sec hold  2.5# therEX - assistance from contralateral LE for increased ROM with each. Right knee AROM=124 deg             Knee extension overpressure 8 x with 30 sec overpressure    Manual  Supine  Min discomfort noted but pt does not report pain - 10 Deg from zero active Right knee ext    SLR 2 x 10   Therex Good quad activation noted, completed following manual, cues for maintaining full knee extension  Hamstring and gastroc manual stretching  2 x 45 sec ea    Manual  -tightness noted in R hamstring and R gastroc   Ice X6 min   No charge  Large Ice pack applied to R knee for pain management and to alleviate DOMS post session     Pt educated throughout session about proper posture and technique with exercises. Improved exercise technique, movement at target joints, use of target muscles after min to mod verbal, visual, tactile cues.                           PT Education - 07/03/21 2110     Education Details Exercise technique    Person(s) Educated Patient    Methods Explanation;Demonstration;Tactile cues;Verbal cues    Comprehension Verbalized understanding;Verbal cues required;Tactile cues required;Need further instruction              PT Short Term Goals - 05/31/21 1112       PT SHORT TERM GOAL #1   Title Pt will demonstrate independence with HEP to improve R LE function for increased ability to participate with ADLs    Baseline 11/3: pt asking for review of HEP, confident and independent with HEP, not completing on days when she has higher levele of pain     Time 4    Period Weeks    Status Achieved    Target Date 04/30/21  PT Long Term Goals - 05/29/21 1306       PT LONG TERM GOAL #1   Title Patient will increase FOTO score to 49 to demonstrate predicted increase in functional mobility to complete ADLs    Baseline 04/02/21: 1; 11/3: 29, 11/29: 44    Time 8    Period Weeks    Status On-going    Target Date 07/24/21      PT LONG TERM GOAL #2   Title Pt will perform 5XSTS in 12 seconds in order to demonstrate improved LE strength and decrease the likelihood of falling.    Baseline 04/02/21: 25; 11/3: 11 seconds    Time 12    Period Weeks    Status Achieved      PT LONG TERM GOAL #3   Title Pt will actively demonstrate 0 deg R knee extension and 120 deg R knee flexion in order to safely negotiate steps and normalize gait without AD.    Baseline 04/02/21 R knee: flex 80 deg, ext: 50; 11/3 (PROM): flex 114*, ext 12* flex 116, ext 11    Time 8    Period Weeks    Status On-going    Target Date 07/24/21      PT LONG TERM GOAL #4   Title Pt will demonstrate SLS of RLE of at least 10sec to demonstrate decreased fall risk and PLOF of contralateral LE    Baseline 04/02/21 LLE 10sec RLE unable; 11/3: LLE 15 sec, RLE 11 sec    Time 8    Period Weeks    Status Achieved      PT LONG TERM GOAL #5   Title Pt will improve 6MWT to greater than 1000 feet in order to indicate improved community ambulation capacity.    Baseline 855 feet on 05/29/21    Time 8    Period Weeks    Status New    Target Date 07/24/21                   Plan - 07/03/21 2112     Clinical Impression Statement Patient presented with good motivation for today's visit and responded well to focus on knee extension today. Reviewed standing, seated, and supine knee extension exercise technique. Paient presents with ROM = 10 deg from zero up to 124 deg right knee AROM. Patient will continue to benefit from skilled Phyiscal Therpay intervention in  order to improve her lower extremity strength, gait quality, knee pain and improved overall functional capabilities.    Personal Factors and Comorbidities Age;Comorbidity 2    Comorbidities DM, anxiety, depression    Examination-Activity Limitations Bed Mobility;Carry;Lift;Locomotion Level;Squat;Sit    Examination-Participation Restrictions Church;Cleaning;Community Activity;Laundry;Driving    Stability/Clinical Decision Making Evolving/Moderate complexity    Rehab Potential Good    PT Frequency 2x / week    PT Duration 12 weeks    PT Treatment/Interventions ADLs/Self Care Home Management;Gait training;Stair training;Functional mobility training;Therapeutic activities;Therapeutic exercise;Balance training;Patient/family education;Passive range of motion;Cryotherapy;Aquatic Therapy;Neuromuscular re-education;Manual techniques;Dry needling;Energy conservation;Scar mobilization;Electrical Stimulation    PT Next Visit Plan Manual Therapy for ROM/Pain relief as appropriate, Progressive LE strengthening and functional mobility training to normalize gait.    PT Home Exercise Plan 05/10/2021-Access Code: [email or web address]  URL: [email or web address]    Consulted and Agree with Plan of Care Patient             Patient will benefit from skilled therapeutic intervention in order to improve the following deficits and impairments:  Abnormal gait, Decreased activity tolerance, Decreased  balance, Decreased endurance, Decreased knowledge of use of DME, Decreased mobility, Decreased strength, Decreased range of motion, Hypomobility, Increased muscle spasms, Impaired flexibility, Decreased skin integrity, Difficulty walking, Improper body mechanics, Pain  Visit Diagnosis: Abnormality of gait and mobility  Difficulty in walking, not elsewhere classified  Muscle weakness (generalized)  Chronic 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     Problem List Patient Active Problem List    Diagnosis Date Noted   OA (osteoarthritis) of knee 03/26/2021   Primary osteoarthritis of right knee 03/26/2021   Leukocytosis 04/19/2020   Thyroid cyst 02/26/2018   Plantar fasciitis 02/19/2016   Family history of breast cancer in sister 03/10/2015   Breast cancer of lower-outer quadrant of right female breast ([place]) 02/10/2015   Abdominal bloating 12/16/2013   Edema 11/09/2013   Urge incontinence 05/05/2013   Atrophic vaginitis 05/05/2013   Syncope 02/15/2013   Irritable bowel syndrome 08/20/2012   Hearing loss 11/07/2011   Generalized anxiety disorder 11/07/2011   Peripheral neuropathy 07/05/2011   Osteoporosis 07/05/2011   Tinnitus, bilateral 04/05/2011   HYPERCHOLESTEROLEMIA 04/05/2010   ANEMIA, IRON DEFICIENCY 04/03/2010   DEPRESSION, MAJOR, MODERATE 04/03/2010   Essential hypertension 04/03/2010   ALLERGIC RHINITIS 04/03/2010   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/03/2010   POSTMENOPAUSAL SYNDROME 04/03/2010   DEGENERATIVE JOINT DISEASE, CERVICAL SPINE 04/03/2010   FIBROMYALGIA 04/03/2010    Lewis Moccasin, PT 07/03/2021, 9:28 PM  [place] MAIN [facility] SERVICES [address] [place], [place], [address] Phone: [phone]   Fax:  [phone]  Name: SKYELYNN RAMBEAU MRN: [id number] Date of Birth: [birth year]

## 2021-07-06 ENCOUNTER — Ambulatory Visit: Payer: Medicare PPO | Admitting: Physical Therapy

## 2021-07-09 ENCOUNTER — Ambulatory Visit
Admission: RE | Admit: 2021-07-09 | Discharge: 2021-07-09 | Disposition: A | Discharge status: Home / Self Care | Payer: Medicare PPO | Source: Ambulatory Visit | Attending: Hematology and Oncology | Admitting: Hematology and Oncology

## 2021-07-09 ENCOUNTER — Other Ambulatory Visit: Payer: Self-pay

## 2021-07-09 DIAGNOSIS — C50511 Malignant neoplasm of lower-outer quadrant of right female breast: Secondary | ICD-10-CM

## 2021-07-09 DIAGNOSIS — Z1231 Encounter for screening mammogram for malignant neoplasm of breast: Secondary | ICD-10-CM | POA: Diagnosis not present

## 2021-07-09 DIAGNOSIS — Z170 Estrogen receptor positive status [ER+]: Secondary | ICD-10-CM

## 2021-07-10 ENCOUNTER — Ambulatory Visit: Payer: Medicare PPO | Admitting: Physical Therapy

## 2021-07-10 DIAGNOSIS — R262 Difficulty in walking, not elsewhere classified: Secondary | ICD-10-CM

## 2021-07-10 DIAGNOSIS — R269 Unspecified abnormalities of gait and mobility: Secondary | ICD-10-CM

## 2021-07-10 DIAGNOSIS — G8929 Other chronic pain: Secondary | ICD-10-CM | POA: Diagnosis not present

## 2021-07-10 DIAGNOSIS — R2681 Unsteadiness on feet: Secondary | ICD-10-CM | POA: Diagnosis not present

## 2021-07-10 DIAGNOSIS — R2689 Other abnormalities of gait and mobility: Secondary | ICD-10-CM | POA: Diagnosis not present

## 2021-07-10 DIAGNOSIS — R278 Other lack of coordination: Secondary | ICD-10-CM | POA: Diagnosis not present

## 2021-07-10 DIAGNOSIS — Z96651 Presence of right artificial knee joint: Secondary | ICD-10-CM

## 2021-07-10 DIAGNOSIS — M6281 Muscle weakness (generalized): Secondary | ICD-10-CM | POA: Diagnosis not present

## 2021-07-10 DIAGNOSIS — M25561 Pain in right knee: Secondary | ICD-10-CM | POA: Diagnosis not present

## 2021-07-10 NOTE — Therapy (Signed)
[place] MAIN [facility] SERVICES [address] [place], [place], [address] Phone: [phone]   Fax:  [phone]  Physical Therapy Treatment  Patient Details  Name: Shannon Hancock MRN: [id number] Date of Birth: Jan 13, 1951 Referring Provider (PT): [name] (Sports medicine @ [facility])   Encounter Date: 07/10/2021   PT End of Session - 07/10/21 0853     Visit Number 28    Number of Visits 34    Date for PT Re-Evaluation 07/24/21    Authorization Type Auth through 12/2; progress note on 13/2, new cert 44/01-0/27/25    Authorization - Number of Visits 34    Progress Note Due on Visit 30    PT Start Time 0848    PT Stop Time 0928    PT Time Calculation (min) 40 min    Equipment Utilized During Treatment Gait belt    Activity Tolerance Patient tolerated treatment well    Behavior During Therapy [facility] for tasks assessed/performed             Past Medical History:  Diagnosis Date   Allergy    Anxiety    Breast cancer ([place]) 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; ER/PR+, Her2-, 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%   Depression    Diabetes mellitus without complication ([place])    Diverticulosis    Fibromyalgia    GERD (gastroesophageal reflux disease)    Heart murmur    "no murmur" documented in PCP note by [name], MD 02/09/21   High cholesterol    Hypertension    Joint pain     Past Surgical History:  Procedure Laterality Date   BREAST LUMPECTOMY Right 2016   BREAST LUMPECTOMY WITH RADIOACTIVE SEED AND SENTINEL LYMPH NODE BIOPSY Right 02/13/2015   Procedure: RIGHT BREAST LUMPECTOMY WITH RADIOACTIVE SEED AND SENTINEL LYMPH NODE MAPPING;  Surgeon: [name], MD;  Location: [place];  Service: General;  Laterality: Right;   FOOT SURGERY Left    TOTAL KNEE ARTHROPLASTY Right 03/26/2021   Procedure: TOTAL KNEE ARTHROPLASTY;  Surgeon: [name], MD;  Location[facility];  Service: Orthopedics;  Laterality: Right;   TUBAL  LIGATION      There were no vitals filed for this visit.   Subjective Assessment - 07/10/21 0851     Subjective Pt reports she is feeling well. reports continued swelling at times. Reports trip to [place] went well and she did not have any mobility issues with her knee.    Pertinent History Shannon Hancock is a 71 y.o. female presenting to physical therapy s/p R TKA 03/26/21. Pt reports to therapy amb with RW. Pt resides in a two story home with six steps to enter from the front and a ramp in the back. She reports negotiating stairs with assistance from her husband. She reports being able to negotiate and live on the main story of her home. Pt reports she would like to increase her R knee mobility, strength, and decrease her pain in order to walk without AD and go bowling. PMH includes breast cancer, fibromyalgia, DM, HTN, anxiety, depression, and hearing loss. Pt denies any unexplained weight fluctuation, saddle paresthesia, unrelenting night pain, or falls in the last 6 months.    Limitations Lifting;Walking;Standing;House hold activities    How long can you sit comfortably? 5-10 min    How long can you stand comfortably? 5 min    How long can you walk comfortably? 5-8 minutes    Patient Stated  Goals Returned and bowling and restore ROM    Currently in Pain? Yes    Pain Score 2     Pain Location Knee    Pain Orientation Right    Pain Descriptors / Indicators Aching    Pain Onset More than a month ago                  Exercise/Activity Sets/Reps/Time/ Resistance Assistance Charge type Comments  Nustep  Level 3, seat 8, 6 min,   therex To improve knee mobility and ROM  -cues for cadence    STS - staggered stance  2x10 No UE There ACT Cues for standing tall to improve knee extension ROM and strength L LE ant to right to increase muscular demand and extension in the right knee upon standing   Step up with knee drive  2 x 10  UE  TherACT To improve muscular power and stability  with step up activity   Knee extension stretch @ step (HS stretch) 3 x 45 sec  B UE for balance  therex To improve knee extension ROM                     Knee extension overpressure 8 x with 30 sec overpressure   Manual  Supine  Min discomfort noted but pt does not report pain     Patellar mobilizations superior  Multiple reps gentle mobilizaitons   Manual  Supine   Hamstring  manual stretching  3 x 45 sec    Manual  -tightness noted in R hamstring and R gastroc   Ice X4 min  No charge  Large Ice pack applied to R knee for pain management and to alleviate DOMS post session   Biodex TM- gait trainer using UE support and gait belt-  Step length= _.62_ m RLE_.51_ m L Time on each foot _51_%R/_49_%L Gait velocity= .77 Total Time on TM = 43mn TherACT  Pt educated throughout session about proper posture and technique with exercises. Improved exercise technique, movement at target joints, use of target muscles after min to mod verbal, visual, tactile cues.  Note: Portions of this document were prepared using Dragon voice recognition software and although reviewed may contain unintentional dictation errors in syntax, grammar, or spelling.                           PT Education - 07/10/21 0853     Education Details Exercise technique    Person(s) Educated Patient    Methods Explanation;Demonstration;Tactile cues;Verbal cues    Comprehension Verbalized understanding;Verbal cues required;Tactile cues required              PT Short Term Goals - 05/31/21 1112       PT SHORT TERM GOAL #1   Title Pt will demonstrate independence with HEP to improve R LE function for increased ability to participate with ADLs    Baseline 11/3: pt asking for review of HEP, confident and independent with HEP, not completing on days when she has higher levele of pain    Time 4    Period Weeks    Status Achieved    Target Date 04/30/21               PT Long Term Goals -  05/29/21 1306       PT LONG TERM GOAL #1   Title Patient will increase FOTO score to 49 to demonstrate predicted increase  in functional mobility to complete ADLs    Baseline 04/02/21: 1; 11/3: 29, 11/29: 44    Time 8    Period Weeks    Status On-going    Target Date 07/24/21      PT LONG TERM GOAL #2   Title Pt will perform 5XSTS in 12 seconds in order to demonstrate improved LE strength and decrease the likelihood of falling.    Baseline 04/02/21: 25; 11/3: 11 seconds    Time 12    Period Weeks    Status Achieved      PT LONG TERM GOAL #3   Title Pt will actively demonstrate 0 deg R knee extension and 120 deg R knee flexion in order to safely negotiate steps and normalize gait without AD.    Baseline 04/02/21 R knee: flex 80 deg, ext: 50; 11/3 (PROM): flex 114*, ext 12* flex 116, ext 11    Time 8    Period Weeks    Status On-going    Target Date 07/24/21      PT LONG TERM GOAL #4   Title Pt will demonstrate SLS of RLE of at least 10sec to demonstrate decreased fall risk and PLOF of contralateral LE    Baseline 04/02/21 LLE 10sec RLE unable; 11/3: LLE 15 sec, RLE 11 sec    Time 8    Period Weeks    Status Achieved      PT LONG TERM GOAL #5   Title Pt will improve 6MWT to greater than 1000 feet in order to indicate improved community ambulation capacity.    Baseline 855 feet on 05/29/21    Time 8    Period Weeks    Status New    Target Date 07/24/21                   Plan - 07/10/21 0855     Clinical Impression Statement Pt presents with good motivation form completion of therapy program this session. Continued to fucus on interventions to improve knee extension ROM and strength with treminal knee extension. Pt able to progress with ambulation on biodex today and dispayed ipmroved step length on the R LE. Pt still ambulates with knee flexed gait pattern. Pt will continue to benefit from skilled PT in order to ipmrove LE strength, R Knee ROM, ambilatory capacity and  overall QOL.    Personal Factors and Comorbidities Age;Comorbidity 2    Comorbidities DM, anxiety, depression    Examination-Activity Limitations Bed Mobility;Carry;Lift;Locomotion Level;Squat;Sit    Examination-Participation Restrictions Church;Cleaning;Community Activity;Laundry;Driving    Stability/Clinical Decision Making Evolving/Moderate complexity    Rehab Potential Good    PT Frequency 2x / week    PT Duration 12 weeks    PT Treatment/Interventions ADLs/Self Care Home Management;Gait training;Stair training;Functional mobility training;Therapeutic activities;Therapeutic exercise;Balance training;Patient/family education;Passive range of motion;Cryotherapy;Aquatic Therapy;Neuromuscular re-education;Manual techniques;Dry needling;Energy conservation;Scar mobilization;Electrical Stimulation    PT Next Visit Plan Manual Therapy for ROM/Pain relief as appropriate, Progressive LE strengthening and functional mobility training to normalize gait.    PT Home Exercise Plan 05/10/2021-Access Code: [email or web address]  URL: [email or web address]    Consulted and Agree with Plan of Care Patient             Patient will benefit from skilled therapeutic intervention in order to improve the following deficits and impairments:  Abnormal gait, Decreased activity tolerance, Decreased balance, Decreased endurance, Decreased knowledge of use of DME, Decreased mobility, Decreased strength, Decreased range of motion, Hypomobility, Increased muscle spasms, Impaired flexibility, Decreased skin  integrity, Difficulty walking, Improper body mechanics, Pain  Visit Diagnosis: Difficulty in walking, not elsewhere classified  Abnormality of gait and mobility  Total knee replacement status, right  Chronic 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     Problem List Patient Active Problem List   Diagnosis Date Noted   OA (osteoarthritis) of knee 03/26/2021   Primary osteoarthritis of right  knee 03/26/2021   Leukocytosis 04/19/2020   Thyroid cyst 02/26/2018   Plantar fasciitis 02/19/2016   Family history of breast cancer in sister 03/10/2015   Breast cancer of lower-outer quadrant of right female breast ([place]) 02/10/2015   Abdominal bloating 12/16/2013   Edema 11/09/2013   Urge incontinence 05/05/2013   Atrophic vaginitis 05/05/2013   Syncope 02/15/2013   Irritable bowel syndrome 08/20/2012   Hearing loss 11/07/2011   Generalized anxiety disorder 11/07/2011   Peripheral neuropathy 07/05/2011   Osteoporosis 07/05/2011   Tinnitus, bilateral 04/05/2011   HYPERCHOLESTEROLEMIA 04/05/2010   ANEMIA, IRON DEFICIENCY 04/03/2010   DEPRESSION, MAJOR, MODERATE 04/03/2010   Essential hypertension 04/03/2010   ALLERGIC RHINITIS 04/03/2010   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/03/2010   POSTMENOPAUSAL SYNDROME 04/03/2010   DEGENERATIVE JOINT DISEASE, CERVICAL SPINE 04/03/2010   FIBROMYALGIA 04/03/2010    Particia Lather, PT 07/10/2021, 12:24 PM  [place] [address] [place], [place], [address] Phone: [phone]   Fax:  [phone]  Name: DEANA KROCK MRN: [id number] Date of Birth: 08-09-1950

## 2021-07-12 ENCOUNTER — Other Ambulatory Visit: Payer: Self-pay

## 2021-07-12 ENCOUNTER — Ambulatory Visit: Payer: Medicare PPO | Admitting: Physical Therapy

## 2021-07-12 DIAGNOSIS — Z96651 Presence of right artificial knee joint: Secondary | ICD-10-CM

## 2021-07-12 DIAGNOSIS — G8929 Other chronic pain: Secondary | ICD-10-CM | POA: Diagnosis not present

## 2021-07-12 DIAGNOSIS — R2681 Unsteadiness on feet: Secondary | ICD-10-CM | POA: Diagnosis not present

## 2021-07-12 DIAGNOSIS — R2689 Other abnormalities of gait and mobility: Secondary | ICD-10-CM | POA: Diagnosis not present

## 2021-07-12 DIAGNOSIS — M6281 Muscle weakness (generalized): Secondary | ICD-10-CM | POA: Diagnosis not present

## 2021-07-12 DIAGNOSIS — R269 Unspecified abnormalities of gait and mobility: Secondary | ICD-10-CM

## 2021-07-12 DIAGNOSIS — R262 Difficulty in walking, not elsewhere classified: Secondary | ICD-10-CM | POA: Diagnosis not present

## 2021-07-12 DIAGNOSIS — M25561 Pain in right knee: Secondary | ICD-10-CM | POA: Diagnosis not present

## 2021-07-12 DIAGNOSIS — R278 Other lack of coordination: Secondary | ICD-10-CM | POA: Diagnosis not present

## 2021-07-12 NOTE — Therapy (Signed)
[place] MAIN [facility] SERVICES [address] [place], [place], [address] Phone: [phone]   Fax:  [phone]  Physical Therapy Treatment  Patient Details  Name: Shannon Hancock MRN: [id number] Date of Birth: 12/21/1950 Referring Provider (PT): [name] (Sports medicine @ [facility])   Encounter Date: 07/12/2021   PT End of Session - 07/12/21 1305     Visit Number 29    Number of Visits 34    Date for PT Re-Evaluation 07/24/21    Authorization Type Auth through 12/2; progress note on 86/7, new cert 67/20-9/47/09    Authorization - Number of Visits 34    Progress Note Due on Visit 30    PT Start Time 0102    PT Stop Time 0144    PT Time Calculation (min) 42 min    Equipment Utilized During Treatment Gait belt    Activity Tolerance Patient tolerated treatment well    Behavior During Therapy [facility] for tasks assessed/performed             Past Medical History:  Diagnosis Date   Allergy    Anxiety    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; ER/PR+, Her2-, 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%   Depression    Diabetes mellitus without complication (HCC)    Diverticulosis    Fibromyalgia    GERD (gastroesophageal reflux disease)    Heart murmur    "no murmur" documented in PCP note by [name], MD 02/09/21   High cholesterol    Hypertension    Joint pain     Past Surgical History:  Procedure Laterality Date   BREAST LUMPECTOMY Right 2016   BREAST LUMPECTOMY WITH RADIOACTIVE SEED AND SENTINEL LYMPH NODE BIOPSY Right 02/13/2015   Procedure: RIGHT BREAST LUMPECTOMY WITH RADIOACTIVE SEED AND SENTINEL LYMPH NODE MAPPING;  Surgeon: [name], MD;  Location: [place];  Service: General;  Laterality: Right;   FOOT SURGERY Left    TOTAL KNEE ARTHROPLASTY Right 03/26/2021   Procedure: TOTAL KNEE ARTHROPLASTY;  Surgeon: [name], MD;  Location: [facility];  Service: Orthopedics;  Laterality: Right;   TUBAL  LIGATION      There were no vitals filed for this visit.   Subjective Assessment - 07/12/21 1304     Subjective Pt reports she is feeling well. Pt rpeorts taking advil and tylenol today for knee and headache respectively. Pt reports knee feels pretty good today and has been functioning well.    Pertinent History Shannon Hancock is a 71 y.o. female presenting to physical therapy s/p R TKA 03/26/21. Pt reports to therapy amb with RW. Pt resides in a two story home with six steps to enter from the front and a ramp in the back. She reports negotiating stairs with assistance from her husband. She reports being able to negotiate and live on the main story of her home. Pt reports she would like to increase her R knee mobility, strength, and decrease her pain in order to walk without AD and go bowling. PMH includes breast cancer, fibromyalgia, DM, HTN, anxiety, depression, and hearing loss. Pt denies any unexplained weight fluctuation, saddle paresthesia, unrelenting night pain, or falls in the last 6 months.    Limitations Lifting;Walking;Standing;House hold activities    How long can you sit comfortably? 5-10 min    How long can you stand comfortably? 5 min    How long can you walk comfortably? 5-8 minutes    Patient  Stated Goals Returned and bowling and restore ROM    Currently in Pain? Yes    Pain Score 2     Pain Location Knee    Pain Onset More than a month ago                 Exercise/Activity Sets/Reps/Time/ Resistance Assistance Charge type Comments  Nustep  Level 3, seat 8, 5 min, seat 9 1:30 to improve terminal knee extension   therex To improve knee mobility and ROM  -cues for cadence    STS - staggered stance  2x10 No UE There ACT Cues for standing tall to improve knee extension ROM and strength L LE ant to right to increase muscular demand and extension in the right knee upon standing   Step up with knee drive  2 x 10  UE  TherACT To improve muscular power and stability with  step up activity   Knee extension stretch @ step (HS stretch) 3 x 45 sec  B UE for balance  therex To improve knee extension ROM   Rocker board with hold in DF  2 x 45 sec  B UE  Therex  To improve DF ROM  Cues for proper weight shifting   Leg press -staggered stance  2 x 10 - 55#  Therex Staggered stance to improve R knee load as well as terminal knee extension strength on this side         Knee extension overpressure 8 x with 30 sec overpressure   Manual  Supine  Min discomfort noted but pt does not report pain     Patellar mobilizations superior  Multiple reps gentle mobilizaitons   Manual  Supine   Hamstring  manual stretching  3 x 45 sec    Manual  -tightness noted in R hamstring and R gastroc   Ice X4 min  No charge  Large Ice pack applied to R knee for pain management and to alleviate DOMS post session    Pt educated throughout session about proper posture and technique with exercises. Improved exercise technique, movement at target joints, use of target muscles after min to mod verbal, visual, tactile cues.  Note: Portions of this document were prepared using Dragon voice recognition software and although reviewed may contain unintentional dictation errors in syntax, grammar, or spelling.                           PT Education - 07/12/21 1304     Education Details Exercise technique    Person(s) Educated Patient    Methods Explanation;Demonstration    Comprehension Verbalized understanding;Returned demonstration;Verbal cues required;Tactile cues required              PT Short Term Goals - 05/31/21 1112       PT SHORT TERM GOAL #1   Title Pt will demonstrate independence with HEP to improve R LE function for increased ability to participate with ADLs    Baseline 11/3: pt asking for review of HEP, confident and independent with HEP, not completing on days when she has higher levele of pain    Time 4    Period Weeks    Status Achieved    Target Date  04/30/21               PT Long Term Goals - 05/29/21 1306       PT LONG TERM GOAL #1   Title Patient will increase  FOTO score to 49 to demonstrate predicted increase in functional mobility to complete ADLs    Baseline 04/02/21: 1; 11/3: 29, 11/29: 44    Time 8    Period Weeks    Status On-going    Target Date 07/24/21      PT LONG TERM GOAL #2   Title Pt will perform 5XSTS in 12 seconds in order to demonstrate improved LE strength and decrease the likelihood of falling.    Baseline 04/02/21: 25; 11/3: 11 seconds    Time 12    Period Weeks    Status Achieved      PT LONG TERM GOAL #3   Title Pt will actively demonstrate 0 deg R knee extension and 120 deg R knee flexion in order to safely negotiate steps and normalize gait without AD.    Baseline 04/02/21 R knee: flex 80 deg, ext: 50; 11/3 (PROM): flex 114*, ext 12* flex 116, ext 11    Time 8    Period Weeks    Status On-going    Target Date 07/24/21      PT LONG TERM GOAL #4   Title Pt will demonstrate SLS of RLE of at least 10sec to demonstrate decreased fall risk and PLOF of contralateral LE    Baseline 04/02/21 LLE 10sec RLE unable; 11/3: LLE 15 sec, RLE 11 sec    Time 8    Period Weeks    Status Achieved      PT LONG TERM GOAL #5   Title Pt will improve 6MWT to greater than 1000 feet in order to indicate improved community ambulation capacity.    Baseline 855 feet on 05/29/21    Time 8    Period Weeks    Status New    Target Date 07/24/21                   Plan - 07/12/21 1306     Clinical Impression Statement Pt presents with good motivation form completion of therapy program this session. Continued to fucus on interventions to improve knee extension ROM and strength with terminal knee extension. Pt showed progress with increased knee extension on leg press today as well as improved tolerance for hamstring stretching. Pt will continue to benefit from skilled PT in order to improve LE strength, R Knee  ROM, ambulatory capacity and overall QOL.    Personal Factors and Comorbidities Age;Comorbidity 2    Comorbidities DM, anxiety, depression    Examination-Activity Limitations Bed Mobility;Carry;Lift;Locomotion Level;Squat;Sit    Examination-Participation Restrictions Church;Cleaning;Community Activity;Laundry;Driving    Stability/Clinical Decision Making Evolving/Moderate complexity    Rehab Potential Good    PT Frequency 2x / week    PT Duration 12 weeks    PT Treatment/Interventions ADLs/Self Care Home Management;Gait training;Stair training;Functional mobility training;Therapeutic activities;Therapeutic exercise;Balance training;Patient/family education;Passive range of motion;Cryotherapy;Aquatic Therapy;Neuromuscular re-education;Manual techniques;Dry needling;Energy conservation;Scar mobilization;Electrical Stimulation    PT Next Visit Plan Manual Therapy for ROM/Pain relief as appropriate, Progressive LE strengthening and functional mobility training to normalize gait.    PT Home Exercise Plan 05/10/2021-Access Code: [email or web address]  URL: [email or web address]    Consulted and Agree with Plan of Care Patient             Patient will benefit from skilled therapeutic intervention in order to improve the following deficits and impairments:  Abnormal gait, Decreased activity tolerance, Decreased balance, Decreased endurance, Decreased knowledge of use of DME, Decreased mobility, Decreased strength, Decreased range of motion, Hypomobility, Increased muscle spasms, Impaired flexibility, Decreased  skin integrity, Difficulty walking, Improper body mechanics, Pain  Visit Diagnosis: Difficulty in walking, not elsewhere classified  Abnormality of gait and mobility  Total knee replacement status, right     Problem List Patient Active Problem List   Diagnosis Date Noted   OA (osteoarthritis) of knee 03/26/2021   Primary osteoarthritis of right knee 03/26/2021   Leukocytosis  04/19/2020   Thyroid cyst 02/26/2018   Plantar fasciitis 02/19/2016   Family history of breast cancer in sister 03/10/2015   Breast cancer of lower-outer quadrant of right female breast ([place]) 02/10/2015   Abdominal bloating 12/16/2013   Edema 11/09/2013   Urge incontinence 05/05/2013   Atrophic vaginitis 05/05/2013   Syncope 02/15/2013   Irritable bowel syndrome 08/20/2012   Hearing loss 11/07/2011   Generalized anxiety disorder 11/07/2011   Peripheral neuropathy 07/05/2011   Osteoporosis 07/05/2011   Tinnitus, bilateral 04/05/2011   HYPERCHOLESTEROLEMIA 04/05/2010   ANEMIA, IRON DEFICIENCY 04/03/2010   DEPRESSION, MAJOR, MODERATE 04/03/2010   Essential hypertension 04/03/2010   ALLERGIC RHINITIS 04/03/2010   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/03/2010   POSTMENOPAUSAL SYNDROME 04/03/2010   DEGENERATIVE JOINT DISEASE, CERVICAL SPINE 04/03/2010   FIBROMYALGIA 04/03/2010    Particia Lather, PT 07/12/2021, 1:57 PM  [place] MAIN [facility] SERVICES [address] [place], [place], [address] Phone: [phone]   Fax:  [phone]  Name: Shannon Hancock MRN: [id number] Date of Birth: 1950-12-08

## 2021-07-17 ENCOUNTER — Other Ambulatory Visit: Payer: Self-pay

## 2021-07-17 ENCOUNTER — Ambulatory Visit: Payer: Medicare PPO | Admitting: Physical Therapy

## 2021-07-17 DIAGNOSIS — M6281 Muscle weakness (generalized): Secondary | ICD-10-CM | POA: Diagnosis not present

## 2021-07-17 DIAGNOSIS — R262 Difficulty in walking, not elsewhere classified: Secondary | ICD-10-CM | POA: Diagnosis not present

## 2021-07-17 DIAGNOSIS — R269 Unspecified abnormalities of gait and mobility: Secondary | ICD-10-CM

## 2021-07-17 DIAGNOSIS — Z96651 Presence of right artificial knee joint: Secondary | ICD-10-CM | POA: Diagnosis not present

## 2021-07-17 DIAGNOSIS — R278 Other lack of coordination: Secondary | ICD-10-CM | POA: Diagnosis not present

## 2021-07-17 DIAGNOSIS — R2689 Other abnormalities of gait and mobility: Secondary | ICD-10-CM | POA: Diagnosis not present

## 2021-07-17 DIAGNOSIS — M25561 Pain in right knee: Secondary | ICD-10-CM | POA: Diagnosis not present

## 2021-07-17 DIAGNOSIS — R2681 Unsteadiness on feet: Secondary | ICD-10-CM | POA: Diagnosis not present

## 2021-07-17 DIAGNOSIS — G8929 Other chronic pain: Secondary | ICD-10-CM | POA: Diagnosis not present

## 2021-07-17 NOTE — Therapy (Signed)
[place] MAIN [facility] SERVICES [address] [place], [place], [address] Phone: [phone]   Fax:  [phone]  Physical Therapy Treatment/Physical Therapy Progress Note   Dates of reporting period  06/07/21   to   07/17/21   Patient Details  Name: Shannon Hancock MRN: [id number] Date of Birth: 12/04/1950 Referring Provider (PT): [name] (Sports medicine @ [facility])   Encounter Date: 07/17/2021   PT End of Session - 07/17/21 0911     Visit Number 30    Number of Visits 34    Date for PT Re-Evaluation 07/24/21    Authorization Type Auth through 12/2; progress note on 38/3, new cert 81/84-0/37/54    Authorization - Number of Visits 34    Progress Note Due on Visit 30    PT Start Time 0919    PT Stop Time 1000    PT Time Calculation (min) 41 min    Equipment Utilized During Treatment Gait belt    Activity Tolerance Patient tolerated treatment well    Behavior During Therapy WFL for tasks assessed/performed             Past Medical History:  Diagnosis Date   Allergy    Anxiety    Breast cancer ([place]) 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; ER/PR+, Her2-, 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%   Depression    Diabetes mellitus without complication ([place])    Diverticulosis    Fibromyalgia    GERD (gastroesophageal reflux disease)    Heart murmur    "no murmur" documented in PCP note by [name], MD 02/09/21   High cholesterol    Hypertension    Joint pain     Past Surgical History:  Procedure Laterality Date   BREAST LUMPECTOMY Right 2016   BREAST LUMPECTOMY WITH RADIOACTIVE SEED AND SENTINEL LYMPH NODE BIOPSY Right 02/13/2015   Procedure: RIGHT BREAST LUMPECTOMY WITH RADIOACTIVE SEED AND SENTINEL LYMPH NODE MAPPING;  Surgeon: [name], MD;  Location: [place];  Service: General;  Laterality: Right;   FOOT SURGERY Left    TOTAL KNEE ARTHROPLASTY Right 03/26/2021   Procedure: TOTAL KNEE ARTHROPLASTY;  Surgeon:  [name], MD;  [facility];  Service: Orthopedics;  Laterality: Right;   TUBAL LIGATION      There were no vitals filed for this visit.   Subjective Assessment - 07/17/21 0916     Subjective Pt reports she is dong well. Reports some pain following walking in different type of shoes over the weekend. Also reports she is sitting when reparing meals and she was encouraged to complete meal prep activities in standing as she can tolerate.    Pertinent History Shannon Hancock is a 71 y.o. female presenting to physical therapy s/p R TKA 03/26/21. Pt reports to therapy amb with RW. Pt resides in a two story home with six steps to enter from the front and a ramp in the back. She reports negotiating stairs with assistance from her husband. She reports being able to negotiate and live on the main story of her home. Pt reports she would like to increase her R knee mobility, strength, and decrease her pain in order to walk without AD and go bowling. PMH includes breast cancer, fibromyalgia, DM, HTN, anxiety, depression, and hearing loss. Pt denies any unexplained weight fluctuation, saddle paresthesia, unrelenting night pain, or falls in the last 6 months.    Limitations Lifting;Walking;Standing;House hold activities    How long can you  sit comfortably? 5-10 min    How long can you stand comfortably? 5 min    How long can you walk comfortably? 5-8 minutes    Patient Stated Goals Returned and bowling and restore ROM    Pain Onset More than a month ago             Exercise/Activity Sets/Reps/Time/ Management consultant type Comments  Nustep  Level 3 x 6 minutes   Therex  To improve LE stiffness and ROM  STS staggered stance (RLE post)  X 10   TherACT To imorpve R knee strength and ROM   6MWT 1180 feet  SBA Therex  To assess walking distance Pt ambulates with more equal step length copmared to previous test. Also ambualtes at increased distance   HS stretch @ steps  2 x 45 sec   therex  To improve R knee extension  Knee manual therapy- extension overpressure, HS stretching X 10 min   therex Primarily to improve knee extension range of motion   Knee ROM measurement     6 degrees passive extension 120 degrees passive flexion   FOTO 34.5   Unable to locate pt in FOTO, had to begin new FOTO this session                           Treatment Provided this session   Pt educated throughout session about proper posture and technique with exercises. Improved exercise technique, movement at target joints, use of target muscles after min to mod verbal, visual, tactile cues. Note: Portions of this document were prepared using Dragon voice recognition software and although reviewed may contain unintentional dictation errors in syntax, grammar, or spelling.                             PT Education - 07/17/21 1356     Education Details Exercise technique    Person(s) Educated Patient    Methods Explanation;Demonstration    Comprehension Verbalized understanding              PT Short Term Goals - 05/31/21 1112       PT SHORT TERM GOAL #1   Title Pt will demonstrate independence with HEP to improve R LE function for increased ability to participate with ADLs    Baseline 11/3: pt asking for review of HEP, confident and independent with HEP, not completing on days when she has higher levele of pain    Time 4    Period Weeks    Status Achieved    Target Date 04/30/21               PT Long Term Goals - 07/17/21 1016       PT LONG TERM GOAL #1   Title Patient will increase FOTO score to 53 to demonstrate predicted increase in functional mobility to complete ADLs    Baseline 34.5 1/17    Time 8    Period Weeks    Status Revised    Target Date 07/24/21      PT LONG TERM GOAL #2   Title Pt will perform 5XSTS in 12 seconds in order to demonstrate improved LE strength and decrease the likelihood of falling.    Baseline 04/02/21: 25; 11/3: 11  seconds    Time 12    Period Weeks    Status Achieved      PT LONG  TERM GOAL #3   Title Pt will actively demonstrate 0 deg R knee extension and 120 deg R knee flexion in order to safely negotiate steps and normalize gait without AD.    Baseline 07/17/21: 6 extension, 120 flexion (PROM)    Time 8    Period Weeks    Status On-going    Target Date 07/24/21      PT LONG TERM GOAL #4   Title Pt will demonstrate SLS of RLE of at least 10sec to demonstrate decreased fall risk and PLOF of contralateral LE    Baseline 04/02/21 LLE 10sec RLE unable; 11/3: LLE 15 sec, RLE 11 sec    Time 8    Period Weeks    Status Achieved      PT LONG TERM GOAL #5   Title Pt will improve 6MWT to greater than 1300 feet in order to indicate improved community ambulation capacity.    Baseline 855 feet on 05/29/21 1180 on 07/17/21    Time 8    Period Weeks    Status New    Target Date 09/11/21                   Plan - 07/17/21 0912     Clinical Impression Statement Pt presents to PT for progress note. Pt continues to have excellent motivation for completion of therapy program and has noted improvement in gait pattern during these reporting dates but still demonstrated flexed knee pattern of gait when ambulaitng. Pt has made significant progress with ambulation distance and time in 6 minute walk test this session. Pt has made continued progress with ROM as demonstrated by ipmroved ROM from previous progress note but pt still lasks AROM for funcitonal ambulation without a flexed knee pattern of gait. Pt wil continue to benefit form skilled PT intervention in order to improve her ROM, ambulatory capacity, ADL capacity and overall QOL. Patient's condition has the potential to improve in response to therapy. Maximum improvement is yet to be obtained. The anticipated improvement is attainable and reasonable in a generally predictable time.      Personal Factors and Comorbidities Age;Comorbidity 3+     Comorbidities HTN, anxiety, depression    Examination-Activity Limitations Bed Mobility;Carry;Lift;Locomotion Level;Squat;Sit    Examination-Participation Restrictions Church;Cleaning;Community Activity;Laundry;Driving    Stability/Clinical Decision Making Evolving/Moderate complexity    Rehab Potential Good    PT Frequency 2x / week    PT Duration 12 weeks    PT Treatment/Interventions ADLs/Self Care Home Management;Gait training;Stair training;Functional mobility training;Therapeutic activities;Therapeutic exercise;Balance training;Patient/family education;Passive range of motion;Cryotherapy;Aquatic Therapy;Neuromuscular re-education;Manual techniques;Dry needling;Energy conservation;Scar mobilization;Electrical Stimulation    PT Next Visit Plan Manual Therapy for ROM/Pain relief as appropriate, Progressive LE strengthening and functional mobility training to normalize gait.    PT Home Exercise Plan 05/10/2021-Access Code: [email or web address]  URL: [email or web address]    Consulted and Agree with Plan of Care Patient             Patient will benefit from skilled therapeutic intervention in order to improve the following deficits and impairments:  Abnormal gait, Decreased activity tolerance, Decreased balance, Decreased endurance, Decreased knowledge of use of DME, Decreased mobility, Decreased strength, Decreased range of motion, Hypomobility, Increased muscle spasms, Impaired flexibility, Decreased skin integrity, Difficulty walking, Improper body mechanics, Pain  Visit Diagnosis: Difficulty in walking, not elsewhere classified  Abnormality of gait and mobility  Total knee replacement status, right     Problem List Patient Active Problem List   Diagnosis Date Noted  OA (osteoarthritis) of knee 03/26/2021   Primary osteoarthritis of right knee 03/26/2021   Leukocytosis 04/19/2020   Thyroid cyst 02/26/2018   Plantar fasciitis 02/19/2016   Family history of breast cancer in  sister 03/10/2015   Breast cancer of lower-outer quadrant of right female breast ([place]) 02/10/2015   Abdominal bloating 12/16/2013   Edema 11/09/2013   Urge incontinence 05/05/2013   Atrophic vaginitis 05/05/2013   Syncope 02/15/2013   Irritable bowel syndrome 08/20/2012   Hearing loss 11/07/2011   Generalized anxiety disorder 11/07/2011   Peripheral neuropathy 07/05/2011   Osteoporosis 07/05/2011   Tinnitus, bilateral 04/05/2011   HYPERCHOLESTEROLEMIA 04/05/2010   ANEMIA, IRON DEFICIENCY 04/03/2010   DEPRESSION, MAJOR, MODERATE 04/03/2010   Essential hypertension 04/03/2010   ALLERGIC RHINITIS 04/03/2010   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/03/2010   POSTMENOPAUSAL SYNDROME 04/03/2010   DEGENERATIVE JOINT DISEASE, CERVICAL SPINE 04/03/2010   FIBROMYALGIA 04/03/2010    Particia Lather, PT 07/17/2021, 2:11 PM  [place] MAIN [facility] SERVICES [address] [place], [place], [address] Phone: [phone]   Fax:  [phone]  Name: Shannon Hancock MRN: [id number] Date of Birth: [birth year]

## 2021-07-19 ENCOUNTER — Encounter: Payer: Self-pay | Admitting: Physical Therapy

## 2021-07-19 ENCOUNTER — Other Ambulatory Visit: Payer: Self-pay

## 2021-07-19 ENCOUNTER — Ambulatory Visit: Payer: Medicare PPO | Admitting: Physical Therapy

## 2021-07-19 DIAGNOSIS — M6281 Muscle weakness (generalized): Secondary | ICD-10-CM | POA: Diagnosis not present

## 2021-07-19 DIAGNOSIS — R269 Unspecified abnormalities of gait and mobility: Secondary | ICD-10-CM

## 2021-07-19 DIAGNOSIS — R278 Other lack of coordination: Secondary | ICD-10-CM | POA: Diagnosis not present

## 2021-07-19 DIAGNOSIS — G8929 Other chronic pain: Secondary | ICD-10-CM | POA: Diagnosis not present

## 2021-07-19 DIAGNOSIS — R2689 Other abnormalities of gait and mobility: Secondary | ICD-10-CM | POA: Diagnosis not present

## 2021-07-19 DIAGNOSIS — R262 Difficulty in walking, not elsewhere classified: Secondary | ICD-10-CM | POA: Diagnosis not present

## 2021-07-19 DIAGNOSIS — R2681 Unsteadiness on feet: Secondary | ICD-10-CM | POA: Diagnosis not present

## 2021-07-19 DIAGNOSIS — Z96651 Presence of right artificial knee joint: Secondary | ICD-10-CM | POA: Diagnosis not present

## 2021-07-19 DIAGNOSIS — M25561 Pain in right knee: Secondary | ICD-10-CM | POA: Diagnosis not present

## 2021-07-19 NOTE — Therapy (Signed)
[place] MAIN [facility] SERVICES [address] [place], [place], [address] Phone: [phone]   Fax:  [phone]  Physical Therapy Treatment  Patient Details  Name: Shannon Hancock MRN: [id number] Date of Birth: September 18, 1950 Referring Provider (PT): [name] (Sports medicine @ [facility] [place])   Encounter Date: 07/19/2021   PT End of Session - 07/19/21 0941     Visit Number 31    Number of Visits 34    Date for PT Re-Evaluation 07/24/21    Authorization Type Auth through 12/2; progress note on 81/0, new cert 17/51-0/25/85    Authorization - Number of Visits 34    Progress Note Due on Visit 30    PT Start Time 0932    PT Stop Time 1015    PT Time Calculation (min) 43 min    Equipment Utilized During Treatment Gait belt    Activity Tolerance Patient tolerated treatment well    Behavior During Therapy [facility] for tasks assessed/performed             Past Medical History:  Diagnosis Date   Allergy    Anxiety    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; ER/PR+, Her2-, 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%   Depression    Diabetes mellitus without complication (HCC)    Diverticulosis    Fibromyalgia    GERD (gastroesophageal reflux disease)    Heart murmur    "no murmur" documented in PCP note by [name], MD 02/09/21   High cholesterol    Hypertension    Joint pain     Past Surgical History:  Procedure Laterality Date   BREAST LUMPECTOMY Right 2016   BREAST LUMPECTOMY WITH RADIOACTIVE SEED AND SENTINEL LYMPH NODE BIOPSY Right 02/13/2015   Procedure: RIGHT BREAST LUMPECTOMY WITH RADIOACTIVE SEED AND SENTINEL LYMPH NODE MAPPING;  Surgeon: [name], MD;  Location: [place];  Service: General;  Laterality: Right;   FOOT SURGERY Left    TOTAL KNEE ARTHROPLASTY Right 03/26/2021   Procedure: TOTAL KNEE ARTHROPLASTY;  Surgeon: [name], MD;  Location: [facility];  Service: Orthopedics;  Laterality: Right;   TUBAL  LIGATION      There were no vitals filed for this visit.   Subjective Assessment - 07/19/21 0940     Subjective Pt reports she is dong well. She still feels stiffness in knee and is having intermittent soreness which worsens throughout the day;    Pertinent History Shannon Hancock is a 71 y.o. female presenting to physical therapy s/p R TKA 03/26/21. Pt reports to therapy amb with RW. Pt resides in a two story home with six steps to enter from the front and a ramp in the back. She reports negotiating stairs with assistance from her husband. She reports being able to negotiate and live on the main story of her home. Pt reports she would like to increase her R knee mobility, strength, and decrease her pain in order to walk without AD and go bowling. PMH includes breast cancer, fibromyalgia, DM, HTN, anxiety, depression, and hearing loss. Pt denies any unexplained weight fluctuation, saddle paresthesia, unrelenting night pain, or falls in the last 6 months.    Limitations Lifting;Walking;Standing;House hold activities    How long can you sit comfortably? 5-10 min    How long can you stand comfortably? 5 min    How long can you walk comfortably? 5-8 minutes    Patient Stated Goals Returned and bowling and restore ROM  Currently in Pain? Yes    Pain Score 4     Pain Location Knee    Pain Orientation Right    Pain Descriptors / Indicators Aching;Sore;Nagging    Pain Type Surgical pain    Pain Onset More than a month ago    Pain Frequency Intermittent    Aggravating Factors  Standing/walking    Pain Relieving Factors meds/ice    Effect of Pain on Daily Activities decreased activity tolerance;    Multiple Pain Sites No              TREATMENT: Warm up on Nustep BLE level 2 x4 min (Unbilled) Seat progressed from #8 to #9 to facilitate increased knee extension, BLE only with good tolerance;  Reinforced HEP- patient reports doing sit<>Stands and stair stretch and long sit stretch; She  reports exercises are going slowly- she has been focusing on stretching and walking;    Long sitting hamstring stretch 20 sec hold x2 reps  Patient supine: Manual therapy: PT performed grade II-III AP mobs femur on tibia in supine, 20 sec bouts x3 sets; PT Performed passive hamstring stretch with overpressure 30 sec hold x2 reps;  Instructed patient in RLE SLR hip flexion x10 reps with cues to slow down LE movement for better motor control;   PT measured RLE knee extension -6 degrees from neutral in supine;     Leg press: BLE plate 55# , 5E52 reps with staggered position (RLE bent more than LLE to increase push in RLE)- moderate difficulty reported;   Patient instructed in stair negotiation: #4 steps, initially step to pattern, able to progress to step through pattern, although when descending does exhibit increased valgus position of RLE with decreased hip control;  Patient finished session with cryotherapy in sitting x4 min with LAQ to reduce stiffness;  Pt educated throughout session about proper posture and technique with exercises. Improved exercise technique, movement at target joints, use of target muscles after min to mod verbal, visual, tactile cues.  Tolerated session fair. Patient continues to have soreness in RLE with advanced exercise. Does report increased fatigue at end of session;                       PT Education - 07/19/21 0941     Education Details exercise technique/positioning;    Person(s) Educated Patient    Methods Explanation;Verbal cues    Comprehension Verbalized understanding;Returned demonstration;Verbal cues required;Need further instruction              PT Short Term Goals - 05/31/21 1112       PT SHORT TERM GOAL #1   Title Pt will demonstrate independence with HEP to improve R LE function for increased ability to participate with ADLs    Baseline 11/3: pt asking for review of HEP, confident and independent with HEP, not  completing on days when she has higher levele of pain    Time 4    Period Weeks    Status Achieved    Target Date 04/30/21               PT Long Term Goals - 07/17/21 1016       PT LONG TERM GOAL #1   Title Patient will increase FOTO score to 53 to demonstrate predicted increase in functional mobility to complete ADLs    Baseline 34.5 1/17    Time 8    Period Weeks    Status Revised  Target Date 07/24/21      PT LONG TERM GOAL #2   Title Pt will perform 5XSTS in 12 seconds in order to demonstrate improved LE strength and decrease the likelihood of falling.    Baseline 04/02/21: 25; 11/3: 11 seconds    Time 12    Period Weeks    Status Achieved      PT LONG TERM GOAL #3   Title Pt will actively demonstrate 0 deg R knee extension and 120 deg R knee flexion in order to safely negotiate steps and normalize gait without AD.    Baseline 07/17/21: 6 extension, 120 flexion (PROM)    Time 8    Period Weeks    Status On-going    Target Date 07/24/21      PT LONG TERM GOAL #4   Title Pt will demonstrate SLS of RLE of at least 10sec to demonstrate decreased fall risk and PLOF of contralateral LE    Baseline 04/02/21 LLE 10sec RLE unable; 11/3: LLE 15 sec, RLE 11 sec    Time 8    Period Weeks    Status Achieved      PT LONG TERM GOAL #5   Title Pt will improve 6MWT to greater than 1300 feet in order to indicate improved community ambulation capacity.    Baseline 855 feet on 05/29/21 1180 on 07/17/21    Time 8    Period Weeks    Status New    Target Date 09/11/21                   Plan - 07/19/21 1002     Clinical Impression Statement Patient motivated and participated well within session. She continues to have increased RLE knee flexion contracture. PT instructed and performed passive LE stretches as well as joint mobilization to facilitate better mobility. She does exhibit good patella mobility and good scar mobility; She reports continued soreness/pain with  prolonged activity throughout the day. She was instructed in advanced LE strengthening focusing on facilitating better knee extension for better mobility. She does report increased fatigue at end of session. She would benefit from additional skilled PT Intervention to improve strength, balance and mobility; She did express desire to review safe equipment in fitness center [employer]. Will do that next session;    Personal Factors and Comorbidities Age;Comorbidity 3+    Comorbidities HTN, anxiety, depression    Examination-Activity Limitations Bed Mobility;Carry;Lift;Locomotion Level;Squat;Sit    Examination-Participation Restrictions Church;Cleaning;Community Activity;Laundry;Driving    Stability/Clinical Decision Making Evolving/Moderate complexity    Rehab Potential Good    PT Frequency 2x / week    PT Duration 12 weeks    PT Treatment/Interventions ADLs/Self Care Home Management;Gait training;Stair training;Functional mobility training;Therapeutic activities;Therapeutic exercise;Balance training;Patient/family education;Passive range of motion;Cryotherapy;Aquatic Therapy;Neuromuscular re-education;Manual techniques;Dry needling;Energy conservation;Scar mobilization;Electrical Stimulation    PT Next Visit Plan Manual Therapy for ROM/Pain relief as appropriate, Progressive LE strengthening and functional mobility training to normalize gait.    PT Home Exercise Plan 05/10/2021-Access Code: [email or web address]  URL: [email or web address]    Consulted and Agree with Plan of Care Patient             Patient will benefit from skilled therapeutic intervention in order to improve the following deficits and impairments:  Abnormal gait, Decreased activity tolerance, Decreased balance, Decreased endurance, Decreased knowledge of use of DME, Decreased mobility, Decreased strength, Decreased range of motion, Hypomobility, Increased muscle spasms, Impaired flexibility, Decreased skin integrity, Difficulty  walking, Improper body mechanics, Pain  Visit Diagnosis:  Difficulty in walking, not elsewhere classified  Abnormality of gait and mobility  Chronic 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  Muscle weakness (generalized)  Other abnormalities of gait and mobility  Other lack of coordination     Problem List Patient Active Problem List   Diagnosis Date Noted   OA (osteoarthritis) of knee 03/26/2021   Primary osteoarthritis of right knee 03/26/2021   Leukocytosis 04/19/2020   Thyroid cyst 02/26/2018   Plantar fasciitis 02/19/2016   Family history of breast cancer in sister 03/10/2015   Breast cancer of lower-outer quadrant of right female breast ([place]) 02/10/2015   Abdominal bloating 12/16/2013   Edema 11/09/2013   Urge incontinence 05/05/2013   Atrophic vaginitis 05/05/2013   Syncope 02/15/2013   Irritable bowel syndrome 08/20/2012   Hearing loss 11/07/2011   Generalized anxiety disorder 11/07/2011   Peripheral neuropathy 07/05/2011   Osteoporosis 07/05/2011   Tinnitus, bilateral 04/05/2011   HYPERCHOLESTEROLEMIA 04/05/2010   ANEMIA, IRON DEFICIENCY 04/03/2010   DEPRESSION, MAJOR, MODERATE 04/03/2010   Essential hypertension 04/03/2010   ALLERGIC RHINITIS 04/03/2010   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/03/2010   POSTMENOPAUSAL SYNDROME 04/03/2010   DEGENERATIVE JOINT DISEASE, CERVICAL SPINE 04/03/2010   FIBROMYALGIA 04/03/2010    Ira Busbin, PT, DPT 07/19/2021, 10:10 AM  [place] MAIN [facility] SERVICES [address] [place], [place], [address] Phone: [phone]   Fax:  [phone]  Name: Shannon Hancock MRN: [id number] Date of Birth: 1951-06-19

## 2021-07-24 ENCOUNTER — Encounter: Payer: Self-pay | Admitting: Physical Therapy

## 2021-07-24 ENCOUNTER — Ambulatory Visit: Payer: Medicare PPO | Admitting: Physical Therapy

## 2021-07-24 ENCOUNTER — Other Ambulatory Visit: Payer: Self-pay

## 2021-07-24 DIAGNOSIS — R269 Unspecified abnormalities of gait and mobility: Secondary | ICD-10-CM | POA: Diagnosis not present

## 2021-07-24 DIAGNOSIS — M25561 Pain in right knee: Secondary | ICD-10-CM

## 2021-07-24 DIAGNOSIS — G8929 Other chronic pain: Secondary | ICD-10-CM | POA: Diagnosis not present

## 2021-07-24 DIAGNOSIS — M6281 Muscle weakness (generalized): Secondary | ICD-10-CM | POA: Diagnosis not present

## 2021-07-24 DIAGNOSIS — R262 Difficulty in walking, not elsewhere classified: Secondary | ICD-10-CM | POA: Diagnosis not present

## 2021-07-24 DIAGNOSIS — R2689 Other abnormalities of gait and mobility: Secondary | ICD-10-CM | POA: Diagnosis not present

## 2021-07-24 DIAGNOSIS — R2681 Unsteadiness on feet: Secondary | ICD-10-CM | POA: Diagnosis not present

## 2021-07-24 DIAGNOSIS — M25562 Pain in left knee: Secondary | ICD-10-CM

## 2021-07-24 DIAGNOSIS — R278 Other lack of coordination: Secondary | ICD-10-CM | POA: Diagnosis not present

## 2021-07-24 DIAGNOSIS — Z96651 Presence of right artificial knee joint: Secondary | ICD-10-CM | POA: Diagnosis not present

## 2021-07-24 NOTE — Patient Instructions (Signed)
Access Code: [email or web address] URL: [email or web address] Date: 07/24/2021 Prepared by: Blanche East  Exercises Recumbent Bike - 1 x daily - 7 x weekly Hamstring Curl with Weight Machine - 1 x daily - 4 x weekly - 2 sets - 10 reps Knee Extension with Weight Machine - 1 x daily - 4 x weekly - 2 sets - 15-20 reps Full Leg Press - 1 x daily - 4 x weekly - 2 sets - 10 reps

## 2021-07-24 NOTE — Therapy (Signed)
[place] MAIN [facility] SERVICES [address] [place], [place], [address] Phone: [phone]   Fax:  [phone]  Physical Therapy Treatment  Patient Details  Name: Shannon Hancock MRN: [id number] Date of Birth: Nov 11, 1950 Referring Provider (PT): [name] (Sports medicine @ [facility] [place])   Encounter Date: 07/24/2021   PT End of Session - 07/24/21 1057     Visit Number 32    Number of Visits 34    Date for PT Re-Evaluation 07/24/21    Authorization Type Auth through 12/2; progress note on 86/7, new cert 61/95-0/93/26    Authorization - Number of Visits 34    Progress Note Due on Visit 30    PT Start Time 0849    PT Stop Time 0934    PT Time Calculation (min) 45 min    Equipment Utilized During Treatment Gait belt    Activity Tolerance Patient tolerated treatment well    Behavior During Therapy [facility] for tasks assessed/performed             Past Medical History:  Diagnosis Date   Allergy    Anxiety    Breast cancer ([place]) 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; ER/PR+, Her2-, 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%   Depression    Diabetes mellitus without complication (HCC)    Diverticulosis    Fibromyalgia    GERD (gastroesophageal reflux disease)    Heart murmur    "no murmur" documented in PCP note by [name], MD 02/09/21   High cholesterol    Hypertension    Joint pain     Past Surgical History:  Procedure Laterality Date   BREAST LUMPECTOMY Right 2016   BREAST LUMPECTOMY WITH RADIOACTIVE SEED AND SENTINEL LYMPH NODE BIOPSY Right 02/13/2015   Procedure: RIGHT BREAST LUMPECTOMY WITH RADIOACTIVE SEED AND SENTINEL LYMPH NODE MAPPING;  Surgeon: [name], MD;  Location: [place];  Service: General;  Laterality: Right;   FOOT SURGERY Left    TOTAL KNEE ARTHROPLASTY Right 03/26/2021   Procedure: TOTAL KNEE ARTHROPLASTY;  Surgeon: [name], MD;  [facility];  Service: Orthopedics;  Laterality: Right;   TUBAL  LIGATION      There were no vitals filed for this visit.   Subjective Assessment - 07/24/21 1056     Subjective Patient reports continued stiffness in RLE knee. No new changes: she was able to wear a small heel over the weekend with minimal difficulty;    Pertinent History Shannon Hancock is a 71 y.o. female presenting to physical therapy s/p R TKA 03/26/21. Pt reports to therapy amb with RW. Pt resides in a two story home with six steps to enter from the front and a ramp in the back. She reports negotiating stairs with assistance from her husband. She reports being able to negotiate and live on the main story of her home. Pt reports she would like to increase her R knee mobility, strength, and decrease her pain in order to walk without AD and go bowling. PMH includes breast cancer, fibromyalgia, DM, HTN, anxiety, depression, and hearing loss. Pt denies any unexplained weight fluctuation, saddle paresthesia, unrelenting night pain, or falls in the last 6 months.    Limitations Lifting;Walking;Standing;House hold activities    How long can you sit comfortably? 5-10 min    How long can you stand comfortably? 5 min    How long can you walk comfortably? 5-8 minutes    Patient Stated Goals Returned and bowling and  restore ROM    Currently in Pain? No/denies    Pain Onset More than a month ago    Multiple Pain Sites No                  TREATMENT: PT instructed patient in proper gym equipment/positioning for safe exercise.  Provided written handout with proper seat position/resistance for optimal exercise, See below:   Access Code: [email or web address] URL: [email or web address] Date: 07/24/2021 Prepared by: Blanche East  Exercises Recumbent Bike - 1 x daily - 7 x weekly (educated in crosstrainer and Nustep positioning, target HR 105-115) Hamstring Curl with Weight Machine - 1 x daily - 4 x weekly - 2 sets - 10 reps Knee Extension with Weight Machine - 1 x daily - 4 x  weekly - 2 sets - 15-20 reps Full Leg Press - 1 x daily - 4 x weekly - 2 sets - 10 reps  Educated patient to do BLE to start and then can adjust to RLE single leg to isolate single leg for better strength and mobility;   Patient did require cues for proper positioning and exercise technique. She would benefit from additional skilled PT Intervention to improve ROM and mobility;                 PT Education - 07/24/21 1056     Education Details exercise technique/gym equipment;    Person(s) Educated Patient    Methods Explanation;Verbal cues    Comprehension Returned demonstration;Verbal cues required;Verbalized understanding;Need further instruction              PT Short Term Goals - 05/31/21 1112       PT SHORT TERM GOAL #1   Title Pt will demonstrate independence with HEP to improve R LE function for increased ability to participate with ADLs    Baseline 11/3: pt asking for review of HEP, confident and independent with HEP, not completing on days when she has higher levele of pain    Time 4    Period Weeks    Status Achieved    Target Date 04/30/21               PT Long Term Goals - 07/17/21 1016       PT LONG TERM GOAL #1   Title Patient will increase FOTO score to 53 to demonstrate predicted increase in functional mobility to complete ADLs    Baseline 34.5 1/17    Time 8    Period Weeks    Status Revised    Target Date 07/24/21      PT LONG TERM GOAL #2   Title Pt will perform 5XSTS in 12 seconds in order to demonstrate improved LE strength and decrease the likelihood of falling.    Baseline 04/02/21: 25; 11/3: 11 seconds    Time 12    Period Weeks    Status Achieved      PT LONG TERM GOAL #3   Title Pt will actively demonstrate 0 deg R knee extension and 120 deg R knee flexion in order to safely negotiate steps and normalize gait without AD.    Baseline 07/17/21: 6 extension, 120 flexion (PROM)    Time 8    Period Weeks    Status On-going     Target Date 07/24/21      PT LONG TERM GOAL #4   Title Pt will demonstrate SLS of RLE of at least 10sec to demonstrate decreased fall risk and PLOF of contralateral LE  Baseline 04/02/21 LLE 10sec RLE unable; 11/3: LLE 15 sec, RLE 11 sec    Time 8    Period Weeks    Status Achieved      PT LONG TERM GOAL #5   Title Pt will improve 6MWT to greater than 1300 feet in order to indicate improved community ambulation capacity.    Baseline 855 feet on 05/29/21 1180 on 07/17/21    Time 8    Period Weeks    Status New    Target Date 09/11/21                   Plan - 07/24/21 1058     Clinical Impression Statement Patient motivated and participated well within session. She was educated in safe positioning and equipment to use in the gym to facilitate better ROM/strengthening in RLE. Patient does require instruction for proper positioning and exercise technique. She was provided with written handout with detailed instruction of seat position, body position and safe weight/resistance. Patient verbalized understanding. Plan to address goals next session. She would benefit from additional skilled PT Intervention to improve strength/ROM and reduce stiffnes in LE.    Personal Factors and Comorbidities Age;Comorbidity 3+    Comorbidities HTN, anxiety, depression    Examination-Activity Limitations Bed Mobility;Carry;Lift;Locomotion Level;Squat;Sit    Examination-Participation Restrictions Church;Cleaning;Community Activity;Laundry;Driving    Stability/Clinical Decision Making Evolving/Moderate complexity    Rehab Potential Good    PT Frequency 2x / week    PT Duration 12 weeks    PT Treatment/Interventions ADLs/Self Care Home Management;Gait training;Stair training;Functional mobility training;Therapeutic activities;Therapeutic exercise;Balance training;Patient/family education;Passive range of motion;Cryotherapy;Aquatic Therapy;Neuromuscular re-education;Manual techniques;Dry  needling;Energy conservation;Scar mobilization;Electrical Stimulation    PT Next Visit Plan Manual Therapy for ROM/Pain relief as appropriate, Progressive LE strengthening and functional mobility training to normalize gait.    PT Home Exercise Plan 05/10/2021-Access Code: [email or web address]  URL: [email or web address]    Consulted and Agree with Plan of Care Patient             Patient will benefit from skilled therapeutic intervention in order to improve the following deficits and impairments:  Abnormal gait, Decreased activity tolerance, Decreased balance, Decreased endurance, Decreased knowledge of use of DME, Decreased mobility, Decreased strength, Decreased range of motion, Hypomobility, Increased muscle spasms, Impaired flexibility, Decreased skin integrity, Difficulty walking, Improper body mechanics, Pain  Visit Diagnosis: Difficulty in walking, not elsewhere classified  Abnormality of gait and mobility  Chronic 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  Muscle weakness (generalized)  Other abnormalities of gait and mobility  Other lack of coordination  Unsteadiness on feet  Chronic pain of right knee  Chronic pain of left knee     Problem List Patient Active Problem List   Diagnosis Date Noted   OA (osteoarthritis) of knee 03/26/2021   Primary osteoarthritis of right knee 03/26/2021   Leukocytosis 04/19/2020   Thyroid cyst 02/26/2018   Plantar fasciitis 02/19/2016   Family history of breast cancer in sister 03/10/2015   Breast cancer of lower-outer quadrant of right female breast ([place]) 02/10/2015   Abdominal bloating 12/16/2013   Edema 11/09/2013   Urge incontinence 05/05/2013   Atrophic vaginitis 05/05/2013   Syncope 02/15/2013   Irritable bowel syndrome 08/20/2012   Hearing loss 11/07/2011   Generalized anxiety disorder 11/07/2011   Peripheral neuropathy 07/05/2011   Osteoporosis 07/05/2011   Tinnitus, bilateral 04/05/2011    HYPERCHOLESTEROLEMIA 04/05/2010   ANEMIA, IRON DEFICIENCY 04/03/2010   DEPRESSION, MAJOR, MODERATE 04/03/2010   Essential hypertension  04/03/2010   ALLERGIC RHINITIS 04/03/2010   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/03/2010   POSTMENOPAUSAL SYNDROME 04/03/2010   DEGENERATIVE JOINT DISEASE, CERVICAL SPINE 04/03/2010   FIBROMYALGIA 04/03/2010    Shannon Hancock, PT, DPT 07/24/2021, 11:00 AM  [place] MAIN [facility] SERVICES [address] [place], [place], [address] Phone: [phone]   Fax:  [phone]  Name: Shannon Hancock MRN: [id number] Date of Birth: 1950-08-05

## 2021-07-25 DIAGNOSIS — H90A22 Sensorineural hearing loss, unilateral, left ear, with restricted hearing on the contralateral side: Secondary | ICD-10-CM | POA: Diagnosis not present

## 2021-07-25 DIAGNOSIS — H6122 Impacted cerumen, left ear: Secondary | ICD-10-CM | POA: Diagnosis not present

## 2021-07-26 ENCOUNTER — Other Ambulatory Visit: Payer: Self-pay

## 2021-07-26 ENCOUNTER — Other Ambulatory Visit: Payer: Self-pay | Admitting: Otolaryngology

## 2021-07-26 ENCOUNTER — Ambulatory Visit: Payer: Medicare PPO | Admitting: Physical Therapy

## 2021-07-26 ENCOUNTER — Encounter: Payer: Self-pay | Admitting: Physical Therapy

## 2021-07-26 DIAGNOSIS — R2689 Other abnormalities of gait and mobility: Secondary | ICD-10-CM | POA: Diagnosis not present

## 2021-07-26 DIAGNOSIS — M25561 Pain in right knee: Secondary | ICD-10-CM | POA: Diagnosis not present

## 2021-07-26 DIAGNOSIS — R262 Difficulty in walking, not elsewhere classified: Secondary | ICD-10-CM | POA: Diagnosis not present

## 2021-07-26 DIAGNOSIS — Z96651 Presence of right artificial knee joint: Secondary | ICD-10-CM | POA: Diagnosis not present

## 2021-07-26 DIAGNOSIS — R2681 Unsteadiness on feet: Secondary | ICD-10-CM | POA: Diagnosis not present

## 2021-07-26 DIAGNOSIS — R269 Unspecified abnormalities of gait and mobility: Secondary | ICD-10-CM

## 2021-07-26 DIAGNOSIS — G8929 Other chronic pain: Secondary | ICD-10-CM

## 2021-07-26 DIAGNOSIS — M6281 Muscle weakness (generalized): Secondary | ICD-10-CM

## 2021-07-26 DIAGNOSIS — M25562 Pain in left knee: Secondary | ICD-10-CM

## 2021-07-26 DIAGNOSIS — H9042 Sensorineural hearing loss, unilateral, left ear, with unrestricted hearing on the contralateral side: Secondary | ICD-10-CM

## 2021-07-26 DIAGNOSIS — R278 Other lack of coordination: Secondary | ICD-10-CM | POA: Diagnosis not present

## 2021-07-26 NOTE — Therapy (Addendum)
[facility] [address] [place], [place], [address] Phone: [phone]   Fax:  [phone]  Physical Therapy Treatment  Patient Details  Name: [facility] MRN: [id number] Date of Birth: 06-27-1951 Referring Provider (PT): [name] (Sports medicine @ [facility])   Encounter Date: 07/26/2021   PT End of Session - 07/26/21 1352     Visit Number 33    Number of Visits 50    Date for PT Re-Evaluation 09/20/21    Authorization Type Auth through 12/2; progress note on 38/7, new cert 56/43-3/29/51, last progress note 1/17; Recert 8/84-1/66    PT Start Time 1346    PT Stop Time 1430    PT Time Calculation (min) 44 min    Activity Tolerance Patient tolerated treatment well    Behavior During Therapy WFL for tasks assessed/performed             Past Medical History:  Diagnosis Date   Allergy    Anxiety    Breast cancer ([facility]) 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; ER/PR+, Her2-, 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%   Depression    Diabetes mellitus without complication (HCC)    Diverticulosis    Fibromyalgia    GERD (gastroesophageal reflux disease)    Heart murmur    "no murmur" documented in PCP note by [name], MD 02/09/21   High cholesterol    Hypertension    Joint pain     Past Surgical History:  Procedure Laterality Date   BREAST LUMPECTOMY Right 2016   BREAST LUMPECTOMY WITH RADIOACTIVE SEED AND SENTINEL LYMPH NODE BIOPSY Right 02/13/2015   Procedure: RIGHT BREAST LUMPECTOMY WITH RADIOACTIVE SEED AND SENTINEL LYMPH NODE MAPPING;  Surgeon: [name], MD;  Location: [facility];  Service: General;  Laterality: Right;   FOOT SURGERY Left    TOTAL KNEE ARTHROPLASTY Right 03/26/2021   Procedure: TOTAL KNEE ARTHROPLASTY;  Surgeon: [name], MD;  Location: [facility];  Service: Orthopedics;  Laterality: Right;   TUBAL LIGATION      There were no vitals filed for this visit.   Subjective  Assessment - 07/26/21 1350     Subjective Patient reports mild soreness in RLE knee. She reports that the soreness is alleviated with advil. She reports "It just won't go straight"    Pertinent History Shannon [facility] is a 71 y.o. female presenting to physical therapy s/p R TKA 03/26/21. Pt reports to therapy amb with RW. Pt resides in a two story home with six steps to enter from the front and a ramp in the back. She reports negotiating stairs with assistance from her husband. She reports being able to negotiate and live on the main story of her home. Pt reports she would like to increase her R knee mobility, strength, and decrease her pain in order to walk without AD and go bowling. PMH includes breast cancer, fibromyalgia, DM, HTN, anxiety, depression, and hearing loss. Pt denies any unexplained weight fluctuation, saddle paresthesia, unrelenting night pain, or falls in the last 6 months.    Limitations Lifting;Walking;Standing;House hold activities    How long can you sit comfortably? 5-10 min    How long can you stand comfortably? 5 min    How long can you walk comfortably? 5-8 minutes    Patient Stated Goals Returned and bowling and restore ROM    Currently in Pain? Yes    Pain Score 2     Pain Location Knee  Pain Orientation Right    Pain Descriptors / Indicators Aching;Sore    Pain Type Surgical pain    Pain Onset More than a month ago    Pain Frequency Intermittent    Aggravating Factors  standing/walking    Pain Relieving Factors meds/ice    Effect of Pain on Daily Activities decreased activity tolerance;    Multiple Pain Sites No                  TREATMENT: Warm up on Nustep BLE level 2 x3 min (Unbilled) Seat progressed from #8 to #9 to facilitate increased knee extension,     Long sitting hamstring stretch 20 sec hold x2 reps   Patient supine: Manual therapy: PT performed grade II-III AP mobs femur on tibia in supine, 20 sec bouts x3 sets; Patient flexed knee  to approximately 60 degrees, PT performed AP mobs and PA mobs tibia on femur 15 sec bouts x3 sets each;  PT Performed passive hamstring stretch with overpressure 30 sec hold x2 reps;   Instructed patient in RLE SLR hip flexion x10 reps with cues to slow down LE movement for better motor control;    PT measured RLE knee extension -4 degrees from neutral in supine; Knee flexion AROM 116 degrees in supine;       Leg press: BLE plate 55# , 9V91 reps with staggered position (RLE bent more than LLE to increase push in RLE)- moderate difficulty reported;  RLE only 25# x10 with moderate difficulty reported, required min VCs to increase terminal knee extension;   Standing in parallel bars: -Hip abduction green tband x10 reps each LE; Green tband around BLE knee: Mini squat with cues to keep knees apart x10 reps  Standing on 6 inch step holding onto rails, RLE eccentric lower x10 reps with visual cues to avoid knee valgus position;   Patient instructed in stair negotiation: #4 steps, initially step to pattern, able to progress to step through pattern, although when descending does exhibit increased valgus position of RLE with decreased hip control;   Patient finished session with cryotherapy in sitting x4 min with LAQ to reduce stiffness;   Pt educated throughout session about proper posture and technique with exercises. Improved exercise technique, movement at target joints, use of target muscles after min to mod verbal, visual, tactile cues.   Tolerated session fair. Patient continues to have soreness in RLE (4-5/10) with advanced exercise. Does report increased fatigue at end of session;                               PT Education - 07/26/21 1351     Education Details exercise technique; positioning;    Person(s) Educated Patient    Methods Explanation;Verbal cues    Comprehension Verbalized understanding;Returned demonstration;Verbal cues required;Need further  instruction              PT Short Term Goals - 05/31/21 1112       PT SHORT TERM GOAL #1   Title Pt will demonstrate independence with HEP to improve R LE function for increased ability to participate with ADLs    Baseline 11/3: pt asking for review of HEP, confident and independent with HEP, not completing on days when she has higher levele of pain    Time 4    Period Weeks    Status Achieved    Target Date 04/30/21  PT Long Term Goals - 07/26/21 0916       PT LONG TERM GOAL #1   Title Patient will increase FOTO score to 53 to demonstrate predicted increase in functional mobility to complete ADLs    Baseline 34.5 1/17    Time 8    Period Weeks    Status Revised    Target Date 09/20/21      PT LONG TERM GOAL #2   Title Pt will perform 5XSTS in 12 seconds in order to demonstrate improved LE strength and decrease the likelihood of falling.    Baseline 04/02/21: 25; 11/3: 11 seconds    Time 12    Period Weeks    Status Achieved      PT LONG TERM GOAL #3   Title Pt will actively demonstrate 0 deg R knee extension and 120 deg R knee flexion in order to safely negotiate steps and normalize gait without AD.    Baseline 07/17/21: 6 extension, 120 flexion (PROM)    Time 8    Period Weeks    Status On-going    Target Date 09/20/21      PT LONG TERM GOAL #4   Title Pt will demonstrate SLS of RLE of at least 10sec to demonstrate decreased fall risk and PLOF of contralateral LE    Baseline 04/02/21 LLE 10sec RLE unable; 11/3: LLE 15 sec, RLE 11 sec    Time 8    Period Weeks    Status Achieved      PT LONG TERM GOAL #5   Title Pt will improve 6MWT to greater than 1300 feet in order to indicate improved community ambulation capacity.    Baseline 855 feet on 05/29/21 1180 on 07/17/21    Time 8    Period Weeks    Status New    Target Date 09/20/21                   Plan - 07/26/21 1427     Clinical Impression Statement Patient motivated and  participated well within session. she was instructed in advanced exercise focusing on knee extension/quad control. PT also added hip abductor strengthening to improve hip stability when descending steps and avoid knee valgus position. She continues to have anterior knee pain with joint mobilizations. Patient was able to progress with knee extension ROM this session by a few degrees. She would benefit from additional skilled PT Intervention to improve strength, ROM and reduce pain with ADLs.    Personal Factors and Comorbidities Age;Comorbidity 3+    Comorbidities HTN, anxiety, depression    Examination-Activity Limitations Bed Mobility;Carry;Lift;Locomotion Level;Squat;Sit    Examination-Participation Restrictions Church;Cleaning;Community Activity;Laundry;Driving    Stability/Clinical Decision Making Evolving/Moderate complexity    Rehab Potential Good    PT Frequency 2x / week    PT Duration 8 weeks    PT Treatment/Interventions ADLs/Self Care Home Management;Gait training;Stair training;Functional mobility training;Therapeutic activities;Therapeutic exercise;Balance training;Patient/family education;Passive range of motion;Cryotherapy;Aquatic Therapy;Neuromuscular re-education;Manual techniques;Dry needling;Energy conservation;Scar mobilization;Electrical Stimulation    PT Next Visit Plan Manual Therapy for ROM/Pain relief as appropriate, Progressive LE strengthening and functional mobility training to normalize gait.    PT Home Exercise Plan 05/10/2021-Access Code: [email or web address]  URL: [email or web address]    Consulted and Agree with Plan of Care Patient             Patient will benefit from skilled therapeutic intervention in order to improve the following deficits and impairments:  Abnormal gait, Decreased activity tolerance, Decreased balance, Decreased endurance, Decreased  knowledge of use of DME, Decreased mobility, Decreased strength, Decreased range of motion, Hypomobility,  Increased muscle spasms, Impaired flexibility, Decreased skin integrity, Difficulty walking, Improper body mechanics, Pain  Visit Diagnosis: Difficulty in walking, not elsewhere classified  Abnormality of gait and mobility  Chronic 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  Muscle weakness (generalized)  Other abnormalities of gait and mobility  Other lack of coordination  Unsteadiness on feet  Chronic pain of right knee  Chronic pain of left knee     Problem List Patient Active Problem List   Diagnosis Date Noted   OA (osteoarthritis) of knee 03/26/2021   Primary osteoarthritis of right knee 03/26/2021   Leukocytosis 04/19/2020   Thyroid cyst 02/26/2018   Plantar fasciitis 02/19/2016   Family history of breast cancer in sister 03/10/2015   Breast cancer of lower-outer quadrant of right female breast ([facility]) 02/10/2015   Abdominal bloating 12/16/2013   Edema 11/09/2013   Urge incontinence 05/05/2013   Atrophic vaginitis 05/05/2013   Syncope 02/15/2013   Irritable bowel syndrome 08/20/2012   Hearing loss 11/07/2011   Generalized anxiety disorder 11/07/2011   Peripheral neuropathy 07/05/2011   Osteoporosis 07/05/2011   Tinnitus, bilateral 04/05/2011   HYPERCHOLESTEROLEMIA 04/05/2010   ANEMIA, IRON DEFICIENCY 04/03/2010   DEPRESSION, MAJOR, MODERATE 04/03/2010   Essential hypertension 04/03/2010   ALLERGIC RHINITIS 04/03/2010   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/03/2010   POSTMENOPAUSAL SYNDROME 04/03/2010   DEGENERATIVE JOINT DISEASE, CERVICAL SPINE 04/03/2010   FIBROMYALGIA 04/03/2010    Davit Vassar, PT, DPT 07/26/2021, 3:34 PM  [place] [facility] [address] [place], [place], [address] Phone: [phone]   Fax:  [phone]  Name: MAURINA FAWAZ MRN: [id number] Date of Birth: 1950-07-30

## 2021-07-31 ENCOUNTER — Ambulatory Visit: Payer: Medicare PPO | Admitting: Physical Therapy

## 2021-07-31 ENCOUNTER — Other Ambulatory Visit: Payer: Self-pay

## 2021-07-31 DIAGNOSIS — M6281 Muscle weakness (generalized): Secondary | ICD-10-CM | POA: Diagnosis not present

## 2021-07-31 DIAGNOSIS — R269 Unspecified abnormalities of gait and mobility: Secondary | ICD-10-CM | POA: Diagnosis not present

## 2021-07-31 DIAGNOSIS — Z96651 Presence of right artificial knee joint: Secondary | ICD-10-CM

## 2021-07-31 DIAGNOSIS — R278 Other lack of coordination: Secondary | ICD-10-CM | POA: Diagnosis not present

## 2021-07-31 DIAGNOSIS — R262 Difficulty in walking, not elsewhere classified: Secondary | ICD-10-CM

## 2021-07-31 DIAGNOSIS — R2689 Other abnormalities of gait and mobility: Secondary | ICD-10-CM

## 2021-07-31 DIAGNOSIS — M25561 Pain in right knee: Secondary | ICD-10-CM | POA: Diagnosis not present

## 2021-07-31 DIAGNOSIS — G8929 Other chronic pain: Secondary | ICD-10-CM

## 2021-07-31 DIAGNOSIS — R2681 Unsteadiness on feet: Secondary | ICD-10-CM | POA: Diagnosis not present

## 2021-07-31 NOTE — Therapy (Signed)
[place] [facility] SERVICES [address] [place], [place], [address] Phone: [phone]   Fax:  [phone]  Physical Therapy Treatment  Patient Details  Name: [facility] MRN: [id number] Date of Birth: [birth year] Referring Provider (PT): [name] (Sports medicine @ [facility])   Encounter Date: 07/31/2021   PT End of Session - 07/31/21 0949     Visit Number 34    Number of Visits 50    Date for PT Re-Evaluation 09/20/21    Authorization Type Auth through 12/2; progress note on 05/3, new cert 97/67-3/41/93, last progress note 1/17; Recert 7/90-2/40    PT Start Time 0933    PT Stop Time 1013    PT Time Calculation (min) 40 min    Equipment Utilized During Treatment Gait belt    Activity Tolerance Patient tolerated treatment well    Behavior During Therapy WFL for tasks assessed/performed             Past Medical History:  Diagnosis Date   Allergy    Anxiety    Breast cancer ([place]) 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; ER/PR+, Her2-, 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%   Depression    Diabetes mellitus without complication (HCC)    Diverticulosis    Fibromyalgia    GERD (gastroesophageal reflux disease)    Heart murmur    "no murmur" documented in PCP note by [name], MD 02/09/21   High cholesterol    Hypertension    Joint pain     Past Surgical History:  Procedure Laterality Date   BREAST LUMPECTOMY Right 2016   BREAST LUMPECTOMY WITH RADIOACTIVE SEED AND SENTINEL LYMPH NODE BIOPSY Right 02/13/2015   Procedure: RIGHT BREAST LUMPECTOMY WITH RADIOACTIVE SEED AND SENTINEL LYMPH NODE MAPPING;  Surgeon: [name], MD;  Location: [facility];  Service: General;  Laterality: Right;   FOOT SURGERY Left    TOTAL KNEE ARTHROPLASTY Right 03/26/2021   Procedure: TOTAL KNEE ARTHROPLASTY;  Surgeon: [name], MD;  [facility];  Service: Orthopedics;  Laterality: Right;   TUBAL LIGATION      There were no  vitals filed for this visit.   Subjective Assessment - 07/31/21 0947     Subjective Patient reports mild soreness in RLE knee. Had some soreness after her last sesion.    Pertinent History [facility] is a 71 y.o. female presenting to physical therapy s/p R TKA 03/26/21. Pt reports to therapy amb with RW. Pt resides in a two story home with six steps to enter from the front and a ramp in the back. She reports negotiating stairs with assistance from her husband. She reports being able to negotiate and live on the main story of her home. Pt reports she would like to increase her R knee mobility, strength, and decrease her pain in order to walk without AD and go bowling. PMH includes breast cancer, fibromyalgia, DM, HTN, anxiety, depression, and hearing loss. Pt denies any unexplained weight fluctuation, saddle paresthesia, unrelenting night pain, or falls in the last 6 months.    Limitations Lifting;Walking;Standing;House hold activities    How long can you sit comfortably? 5-10 min    How long can you stand comfortably? 5 min    How long can you walk comfortably? 5-8 minutes    Patient Stated Goals Returned and bowling and restore ROM    Pain Onset More than a month ago  Exercise/Activity Sets/Reps/Time/ Resistance Assistance Charge type Comments        STS - staggered stance  2x10 No UE There ACT Cues for standing tall to improve knee extension ROM and strength L LE ant to right to increase muscular demand and extension in the right knee upon standing   Step up with knee drive  2 x 10  UE  TherACT To improve muscular power and stability with step up activity   Knee extension stretch @ step (HS stretch) 2 x 45 sec  B UE for balance  therex To improve knee extension ROM   Gastroc stretch standing- lunge position holding to bar  3 x 45 sec  B UE for balance  Therex  To improve knee extension ROM (tightness noted in gastroc)   Leg press -staggered stance  1x10 @ 45# 2 x 10 -  55#  Therex Staggered stance to improve R knee load as well as terminal knee extension strength on this side   Standing TKE with GTB  10 x GTB  Therex  TO improve terminal knee extension strength and ROM.   Knee extension overpressure 8 x with 30 sec overpressure   Manual  Supine  Min discomfort noted but pt does not report pain     Patellar mobilizations superior  Multiple reps gentle mobilizaitons   Manual  Supine   Hamstring  manual stretching  3 x 45 sec    Manual  -tightness noted in R hamstring and R gastroc   Ice X4 min  No charge  Large Ice pack applied to R knee for pain management and to alleviate DOMS post session    Pt educated throughout session about proper posture and technique with exercises. Improved exercise technique, movement at target joints, use of target muscles after min to mod verbal, visual, tactile cues.  Note: Portions of this document were prepared using Dragon voice recognition software and although reviewed may contain unintentional dictation errors in syntax, grammar, or spelling.                               PT Education - 07/31/21 0948     Education Details exercise technique, potitioning    Person(s) Educated Patient    Methods Explanation;Verbal cues    Comprehension Verbalized understanding;Returned demonstration;Verbal cues required              PT Short Term Goals - 05/31/21 1112       PT SHORT TERM GOAL #1   Title Pt will demonstrate independence with HEP to improve R LE function for increased ability to participate with ADLs    Baseline 11/3: pt asking for review of HEP, confident and independent with HEP, not completing on days when she has higher levele of pain    Time 4    Period Weeks    Status Achieved    Target Date 04/30/21               PT Long Term Goals - 07/17/21 1016       PT LONG TERM GOAL #1   Title Patient will increase FOTO score to 53 to demonstrate predicted increase in functional  mobility to complete ADLs    Baseline 34.5 1/17    Time 8    Period Weeks    Status Revised    Target Date 07/24/21      PT LONG TERM GOAL #2   Title Pt  will perform 5XSTS in 12 seconds in order to demonstrate improved LE strength and decrease the likelihood of falling.    Baseline 04/02/21: 25; 11/3: 11 seconds    Time 12    Period Weeks    Status Achieved      PT LONG TERM GOAL #3   Title Pt will actively demonstrate 0 deg R knee extension and 120 deg R knee flexion in order to safely negotiate steps and normalize gait without AD.    Baseline 07/17/21: 6 extension, 120 flexion (PROM)    Time 8    Period Weeks    Status On-going    Target Date 07/24/21      PT LONG TERM GOAL #4   Title Pt will demonstrate SLS of RLE of at least 10sec to demonstrate decreased fall risk and PLOF of contralateral LE    Baseline 04/02/21 LLE 10sec RLE unable; 11/3: LLE 15 sec, RLE 11 sec    Time 8    Period Weeks    Status Achieved      PT LONG TERM GOAL #5   Title Pt will improve 6MWT to greater than 1300 feet in order to indicate improved community ambulation capacity.    Baseline 855 feet on 05/29/21 1180 on 07/17/21    Time 8    Period Weeks    Status New    Target Date 09/11/21                   Plan - 07/31/21 0950     Clinical Impression Statement Patient motivated and participated well within session. She coninues ot have limitations in knee extension range of motion but her overall funciton continues to improve.  She was instructed in exercise focusing on knee extensionrange of motion and quad control.  Pt still requires moderate cues for form with many exercises but following cues from PT pt performs well. She would benefit from additional skilled PT Intervention to improve strength, ROM and reduce pain with ADLs.    Personal Factors and Comorbidities Age;Comorbidity 3+    Comorbidities HTN, anxiety, depression    Examination-Activity Limitations Bed  Mobility;Carry;Lift;Locomotion Level;Squat;Sit    Examination-Participation Restrictions Church;Cleaning;Community Activity;Laundry;Driving    Stability/Clinical Decision Making Evolving/Moderate complexity    Rehab Potential Good    PT Frequency 2x / week    PT Duration 12 weeks    PT Treatment/Interventions ADLs/Self Care Home Management;Gait training;Stair training;Functional mobility training;Therapeutic activities;Therapeutic exercise;Balance training;Patient/family education;Passive range of motion;Cryotherapy;Aquatic Therapy;Neuromuscular re-education;Manual techniques;Dry needling;Energy conservation;Scar mobilization;Electrical Stimulation    PT Next Visit Plan Manual Therapy for ROM/Pain relief as appropriate, Progressive LE strengthening and functional mobility training to normalize gait.    PT Home Exercise Plan 05/10/2021-Access Code: [email or web address]  URL: [email or web address]    Consulted and Agree with Plan of Care Patient             Patient will benefit from skilled therapeutic intervention in order to improve the following deficits and impairments:  Abnormal gait, Decreased activity tolerance, Decreased balance, Decreased endurance, Decreased knowledge of use of DME, Decreased mobility, Decreased strength, Decreased range of motion, Hypomobility, Increased muscle spasms, Impaired flexibility, Decreased skin integrity, Difficulty walking, Improper body mechanics, Pain  Visit Diagnosis: Difficulty in walking, not elsewhere classified  Abnormality of gait and mobility  Chronic 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  Other abnormalities of gait and mobility     Problem List Patient Active Problem List   Diagnosis Date Noted   OA (osteoarthritis) of  knee 03/26/2021   Primary osteoarthritis of right knee 03/26/2021   Leukocytosis 04/19/2020   Thyroid cyst 02/26/2018   Plantar fasciitis 02/19/2016   Family history of breast cancer in sister  03/10/2015   Breast cancer of lower-outer quadrant of right female breast ([place]) 02/10/2015   Abdominal bloating 12/16/2013   Edema 11/09/2013   Urge incontinence 05/05/2013   Atrophic vaginitis 05/05/2013   Syncope 02/15/2013   Irritable bowel syndrome 08/20/2012   Hearing loss 11/07/2011   Generalized anxiety disorder 11/07/2011   Peripheral neuropathy 07/05/2011   Osteoporosis 07/05/2011   Tinnitus, bilateral 04/05/2011   HYPERCHOLESTEROLEMIA 04/05/2010   ANEMIA, IRON DEFICIENCY 04/03/2010   DEPRESSION, MAJOR, MODERATE 04/03/2010   Essential hypertension 04/03/2010   ALLERGIC RHINITIS 04/03/2010   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/03/2010   POSTMENOPAUSAL SYNDROME 04/03/2010   DEGENERATIVE JOINT DISEASE, CERVICAL SPINE 04/03/2010   FIBROMYALGIA 04/03/2010    Particia Lather, PT 07/31/2021, 11:07 AM  [place] [address] [place], [place], [address] Phone: [phone]   Fax:  [phone]  Name: [facility] MRN: [id number] Date of Birth: [birth year]

## 2021-08-01 ENCOUNTER — Encounter: Payer: Medicare PPO | Admitting: Physical Therapy

## 2021-08-02 ENCOUNTER — Ambulatory Visit: Payer: Medicare PPO | Attending: Sports Medicine | Admitting: Physical Therapy

## 2021-08-02 ENCOUNTER — Other Ambulatory Visit: Payer: Self-pay

## 2021-08-02 DIAGNOSIS — R2689 Other abnormalities of gait and mobility: Secondary | ICD-10-CM | POA: Insufficient documentation

## 2021-08-02 DIAGNOSIS — G8929 Other chronic pain: Secondary | ICD-10-CM | POA: Insufficient documentation

## 2021-08-02 DIAGNOSIS — M6281 Muscle weakness (generalized): Secondary | ICD-10-CM | POA: Diagnosis not present

## 2021-08-02 DIAGNOSIS — Z96651 Presence of right artificial knee joint: Secondary | ICD-10-CM | POA: Insufficient documentation

## 2021-08-02 DIAGNOSIS — R2681 Unsteadiness on feet: Secondary | ICD-10-CM | POA: Diagnosis not present

## 2021-08-02 DIAGNOSIS — R269 Unspecified abnormalities of gait and mobility: Secondary | ICD-10-CM | POA: Insufficient documentation

## 2021-08-02 DIAGNOSIS — R278 Other lack of coordination: Secondary | ICD-10-CM | POA: Diagnosis not present

## 2021-08-02 DIAGNOSIS — M25561 Pain in right knee: Secondary | ICD-10-CM | POA: Diagnosis not present

## 2021-08-02 DIAGNOSIS — R262 Difficulty in walking, not elsewhere classified: Secondary | ICD-10-CM | POA: Diagnosis not present

## 2021-08-02 NOTE — Therapy (Signed)
[place] MAIN [facility] SERVICES [address] [place], [place], [address] Phone: [phone]   Fax:  [phone]  Physical Therapy Treatment  Patient Details  Name: Shannon Hancock MRN: [id number] Date of Birth: September 29, 1950 Referring Provider (PT): [name] ([facility] medicine @ [facility] [place])   Encounter Date: 08/02/2021   PT End of Session - 08/02/21 1254     Visit Number 35    Number of Visits 50    Date for PT Re-Evaluation 09/20/21    Authorization Type Auth through 12/2; progress note on 17/7, new cert 93/90-3/00/92, last progress note 1/17; Recert 3/30-0/76    PT Start Time 1018    PT Stop Time 1105    PT Time Calculation (min) 47 min    Equipment Utilized During Treatment Gait belt    Activity Tolerance Patient tolerated treatment well    Behavior During Therapy WFL for tasks assessed/performed             Past Medical History:  Diagnosis Date   Allergy    Anxiety    Breast cancer ([place]) 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; ER/PR+, Her2-, 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%   Depression    Diabetes mellitus without complication (HCC)    Diverticulosis    Fibromyalgia    GERD (gastroesophageal reflux disease)    Heart murmur    "no murmur" documented in PCP note by [name], MD 02/09/21   High cholesterol    Hypertension    Joint pain     Past Surgical History:  Procedure Laterality Date   BREAST LUMPECTOMY Right 2016   BREAST LUMPECTOMY WITH RADIOACTIVE SEED AND SENTINEL LYMPH NODE BIOPSY Right 02/13/2015   Procedure: RIGHT BREAST LUMPECTOMY WITH RADIOACTIVE SEED AND SENTINEL LYMPH NODE MAPPING;  Surgeon: [name], MD;  Location: [place];  Service: General;  Laterality: Right;   FOOT SURGERY Left    TOTAL KNEE ARTHROPLASTY Right 03/26/2021   Procedure: TOTAL KNEE ARTHROPLASTY;  Surgeon: [name], MD;  Location: [facility];  Service: Orthopedics;  Laterality: Right;   TUBAL LIGATION      There were no  vitals filed for this visit.   Subjective Assessment - 08/02/21 1023     Subjective Patient reports mild soreness in RLE knee; she did take pain meds prior to session. Pt felt good after leaving last session. Pt is compliant with HEP.    Pertinent History Shannon Hancock is a 71 y.o. female presenting to physical therapy s/p R TKA 03/26/21. Pt reports to therapy amb with RW. Pt resides in a two story home with six steps to enter from the front and a ramp in the back. She reports negotiating stairs with assistance from her husband. She reports being able to negotiate and live on the main story of her home. Pt reports she would like to increase her R knee mobility, strength, and decrease her pain in order to walk without AD and go bowling. PMH includes breast cancer, fibromyalgia, DM, HTN, anxiety, depression, and hearing loss. Pt denies any unexplained weight fluctuation, saddle paresthesia, unrelenting night pain, or falls in the last 6 months.    Limitations Lifting;Walking;Standing;House hold activities    How long can you sit comfortably? 5-10 min    How long can you stand comfortably? 5 min    How long can you walk comfortably? 5-8 minutes    Patient Stated Goals Returned and bowling and restore ROM    Pain Onset More than  a month ago              TREATMENT: Warm up on treadmill, increasing speed from 1.6-2.83mh, x4 min. VC for improved gait mechanics and posture.     Manual therapy (supine): Grade II-III AP mobs femur on tibia, 30 sec bouts x3 sets; Knee flexed to ~60 degrees, PT performed AP mobs and PA mobs tibia on femur 20 sec bouts x3 sets each;  Hamstring stretch with overpressure for increased knee extension 60 sec x2 reps; DF stretch 60 sec x2 reps; Knee extension overpressure, 3 x 30 second hold; Patellar mobs, sup-inf and lateral mobs; 2x60 seconds each   Leg press: BLE 55# , x12 reps, reports no difficulty;  BLE 70#, 2x12 reps, reports mild difficulty; RLE only 30#  x12 with no difficulty; RLE only 40# x12 with moderate difficulty; PT providing tactile support at right knee during SL to prevent varus/valgus deviation. VCs to increase terminal knee extension;    6" step up with knee drive, RLE focus on power and muscle recruitment 2 x 12 reps; Calf stretch on step x60 sec, VC to shift hips forward;   Standing on Airex pad: Toe taps to cone 2 x 15 each side - focus on coordination and hip flexion when tapping with RLE, and on right single leg stability when tapping with left. No UE support. CGA for stability.  PT measured AAROM RLE knee extension -3 degrees from neutral in supine;    Patient finished session with cryotherapy in supine x5 min;   Pt educated throughout session about proper posture and technique with exercises. Improved exercise technique, movement at target joints, use of target muscles after min to mod verbal, visual, tactile cues.     Pt demonstrates good motivation throughout session. Increased manual techniques included with focus on improving knee extension. Pt was able to increase from 4* (last measured) to 3* by the end of this session. PT noted pt has deficits in SL stability bilaterally. Pt is progressing in strength as weight on leg press was increased this date. She did report fatigue in RLE by end of session. She would benefit from additional skilled PT Intervention to improve strength, ROM and reduce pain with ADLs.            PT Short Term Goals - 05/31/21 1112       PT SHORT TERM GOAL #1   Title Pt will demonstrate independence with HEP to improve R LE function for increased ability to participate with ADLs    Baseline 11/3: pt asking for review of HEP, confident and independent with HEP, not completing on days when she has higher levele of pain    Time 4    Period Weeks    Status Achieved    Target Date 04/30/21               PT Long Term Goals - 07/17/21 1016       PT LONG TERM GOAL #1   Title  Patient will increase FOTO score to 53 to demonstrate predicted increase in functional mobility to complete ADLs    Baseline 34.5 1/17    Time 8    Period Weeks    Status Revised    Target Date 07/24/21      PT LONG TERM GOAL #2   Title Pt will perform 5XSTS in 12 seconds in order to demonstrate improved LE strength and decrease the likelihood of falling.    Baseline 04/02/21: 25; 11/3: 11  seconds    Time 12    Period Weeks    Status Achieved      PT LONG TERM GOAL #3   Title Pt will actively demonstrate 0 deg R knee extension and 120 deg R knee flexion in order to safely negotiate steps and normalize gait without AD.    Baseline 07/17/21: 6 extension, 120 flexion (PROM)    Time 8    Period Weeks    Status On-going    Target Date 07/24/21      PT LONG TERM GOAL #4   Title Pt will demonstrate SLS of RLE of at least 10sec to demonstrate decreased fall risk and PLOF of contralateral LE    Baseline 04/02/21 LLE 10sec RLE unable; 11/3: LLE 15 sec, RLE 11 sec    Time 8    Period Weeks    Status Achieved      PT LONG TERM GOAL #5   Title Pt will improve 6MWT to greater than 1300 feet in order to indicate improved community ambulation capacity.    Baseline 855 feet on 05/29/21 1180 on 07/17/21    Time 8    Period Weeks    Status New    Target Date 09/11/21                   Plan - 08/02/21 1253     Clinical Impression Statement Pt demonstrates good motivation throughout session. Increased manual techniques included with focus on improving knee extension. Pt was able to increase from 4* (last measured) to 3* by the end of this session. PT noted pt has deficits in SL stability bilaterally. Pt is progressing in strength as weight on leg press was increased this date. She did report fatigue in RLE by end of session. She would benefit from additional skilled PT Intervention to improve strength, ROM and reduce pain with ADLs.    Personal Factors and Comorbidities Age;Comorbidity 3+     Comorbidities HTN, anxiety, depression    Examination-Activity Limitations Bed Mobility;Carry;Lift;Locomotion Level;Squat;Sit    Examination-Participation Restrictions Church;Cleaning;Community Activity;Laundry;Driving    Stability/Clinical Decision Making Evolving/Moderate complexity    Rehab Potential Good    PT Frequency 2x / week    PT Duration 12 weeks    PT Treatment/Interventions ADLs/Self Care Home Management;Gait training;Stair training;Functional mobility training;Therapeutic activities;Therapeutic exercise;Balance training;Patient/family education;Passive range of motion;Cryotherapy;Aquatic Therapy;Neuromuscular re-education;Manual techniques;Dry needling;Energy conservation;Scar mobilization;Electrical Stimulation    PT Next Visit Plan Manual Therapy for ROM/Pain relief as appropriate, Progressive LE strengthening and functional mobility training to normalize gait.    PT Home Exercise Plan 05/10/2021-Access Code: [email or web address]  URL: [email or web address]    Consulted and Agree with Plan of Care Patient             Patient will benefit from skilled therapeutic intervention in order to improve the following deficits and impairments:  Abnormal gait, Decreased activity tolerance, Decreased balance, Decreased endurance, Decreased knowledge of use of DME, Decreased mobility, Decreased strength, Decreased range of motion, Hypomobility, Increased muscle spasms, Impaired flexibility, Decreased skin integrity, Difficulty walking, Improper body mechanics, Pain  Visit Diagnosis: Difficulty in walking, not elsewhere classified  Abnormality of gait and mobility  Chronic 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  Other abnormalities of gait and mobility  Muscle weakness (generalized)  Other lack of coordination  Unsteadiness on feet  Total knee replacement status, right     Problem List Patient Active Problem List   Diagnosis Date Noted   OA  (osteoarthritis) of  knee 03/26/2021   Primary osteoarthritis of right knee 03/26/2021   Leukocytosis 04/19/2020   Thyroid cyst 02/26/2018   Plantar fasciitis 02/19/2016   Family history of breast cancer in sister 03/10/2015   Breast cancer of lower-outer quadrant of right female breast ([employer]) 02/10/2015   Abdominal bloating 12/16/2013   Edema 11/09/2013   Urge incontinence 05/05/2013   Atrophic vaginitis 05/05/2013   Syncope 02/15/2013   Irritable bowel syndrome 08/20/2012   Hearing loss 11/07/2011   Generalized anxiety disorder 11/07/2011   Peripheral neuropathy 07/05/2011   Osteoporosis 07/05/2011   Tinnitus, bilateral 04/05/2011   HYPERCHOLESTEROLEMIA 04/05/2010   ANEMIA, IRON DEFICIENCY 04/03/2010   DEPRESSION, MAJOR, MODERATE 04/03/2010   Essential hypertension 04/03/2010   ALLERGIC RHINITIS 04/03/2010   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/03/2010   POSTMENOPAUSAL SYNDROME 04/03/2010   DEGENERATIVE JOINT DISEASE, CERVICAL SPINE 04/03/2010   FIBROMYALGIA 04/03/2010    Patrina Levering PT, DPT 08/02/21 12:57 PM [place] MAIN [facility] SERVICES [address] [place], [place], [address] Phone: [phone]   Fax:  [phone]  Name: Shannon Hancock MRN: [id number] Date of Birth: 1951-05-05

## 2021-08-04 ENCOUNTER — Other Ambulatory Visit: Payer: Self-pay | Admitting: Otolaryngology

## 2021-08-04 DIAGNOSIS — H9042 Sensorineural hearing loss, unilateral, left ear, with unrestricted hearing on the contralateral side: Secondary | ICD-10-CM

## 2021-08-06 ENCOUNTER — Ambulatory Visit: Payer: Medicare PPO

## 2021-08-06 ENCOUNTER — Other Ambulatory Visit: Payer: Self-pay

## 2021-08-06 DIAGNOSIS — R269 Unspecified abnormalities of gait and mobility: Secondary | ICD-10-CM | POA: Diagnosis not present

## 2021-08-06 DIAGNOSIS — R262 Difficulty in walking, not elsewhere classified: Secondary | ICD-10-CM

## 2021-08-06 DIAGNOSIS — R278 Other lack of coordination: Secondary | ICD-10-CM | POA: Diagnosis not present

## 2021-08-06 DIAGNOSIS — M6281 Muscle weakness (generalized): Secondary | ICD-10-CM

## 2021-08-06 DIAGNOSIS — R2689 Other abnormalities of gait and mobility: Secondary | ICD-10-CM | POA: Diagnosis not present

## 2021-08-06 DIAGNOSIS — G8929 Other chronic pain: Secondary | ICD-10-CM | POA: Diagnosis not present

## 2021-08-06 DIAGNOSIS — R2681 Unsteadiness on feet: Secondary | ICD-10-CM | POA: Diagnosis not present

## 2021-08-06 DIAGNOSIS — Z96651 Presence of right artificial knee joint: Secondary | ICD-10-CM

## 2021-08-06 DIAGNOSIS — M25561 Pain in right knee: Secondary | ICD-10-CM | POA: Diagnosis not present

## 2021-08-06 NOTE — Therapy (Signed)
[place] MAIN [facility] SERVICES [address] [place], [place], [address] Phone: [phone]   Fax:  [phone]  Physical Therapy Treatment  Patient Details  Name: Shannon Hancock MRN: [id number] Date of Birth: [birth year] Referring Provider (PT): [name] ([facility] medicine @ [facility])   Encounter Date: 08/06/2021   PT End of Session - 08/06/21 0755     Visit Number 36    Number of Visits 50    Date for PT Re-Evaluation 09/20/21    Authorization Type Auth through 12/2; progress note on 97/5, new cert 88/32-5/49/82, last progress note 1/17; Recert 6/41-5/83    PT Start Time 0800    PT Stop Time 0852    PT Time Calculation (min) 52 min    Equipment Utilized During Treatment Gait belt    Activity Tolerance Patient tolerated treatment well    Behavior During Therapy WFL for tasks assessed/performed             Past Medical History:  Diagnosis Date   Allergy    Anxiety    Breast cancer ([place]) 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; ER/PR+, Her2-, 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%   Depression    Diabetes mellitus without complication (HCC)    Diverticulosis    Fibromyalgia    GERD (gastroesophageal reflux disease)    Heart murmur    "no murmur" documented in PCP note by [name], MD 02/09/21   High cholesterol    Hypertension    Joint pain     Past Surgical History:  Procedure Laterality Date   BREAST LUMPECTOMY Right 2016   BREAST LUMPECTOMY WITH RADIOACTIVE SEED AND SENTINEL LYMPH NODE BIOPSY Right 02/13/2015   Procedure: RIGHT BREAST LUMPECTOMY WITH RADIOACTIVE SEED AND SENTINEL LYMPH NODE MAPPING;  Surgeon: [name], MD;  Location: [place];  Service: General;  Laterality: Right;   FOOT SURGERY Left    TOTAL KNEE ARTHROPLASTY Right 03/26/2021   Procedure: TOTAL KNEE ARTHROPLASTY;  Surgeon: [name], MD;  [facility];  Service: Orthopedics;  Laterality: Right;   TUBAL LIGATION      There were no  vitals filed for this visit.   Subjective Assessment - 08/06/21 0754     Subjective I feel llke I am having some good days with walking and some not so good days. My knee is about a 2-3/10 today.    Pertinent History Shannon Hancock is a 71 y.o. female presenting to physical therapy s/p R TKA 03/26/21. Pt reports to therapy amb with RW. Pt resides in a two story home with six steps to enter from the front and a ramp in the back. She reports negotiating stairs with assistance from her husband. She reports being able to negotiate and live on the main story of her home. Pt reports she would like to increase her R knee mobility, strength, and decrease her pain in order to walk without AD and go bowling. PMH includes breast cancer, fibromyalgia, DM, HTN, anxiety, depression, and hearing loss. Pt denies any unexplained weight fluctuation, saddle paresthesia, unrelenting night pain, or falls in the last 6 months.    Limitations Lifting;Walking;Standing;House hold activities    How long can you sit comfortably? 5-10 min    How long can you stand comfortably? 5 min    How long can you walk comfortably? 5-8 minutes    Patient Stated Goals Returned and bowling and restore ROM    Currently in Pain? Yes  Pain Score 3     Pain Location Knee    Pain Orientation Right    Pain Descriptors / Indicators Aching;Sore    Pain Type Surgical pain    Pain Onset More than a month ago    Pain Frequency Intermittent    Pain Relieving Factors Medication; Ice             INTERVENTIONS:     Nustep Interval workout:  Level 0 = 1 min Level 3= 1 min Level 1= 1 min Level 4= 1 min 30 sec Level 1 = 1 min Total distance= 0.28 mi  RPE= 4/10  Seated hamstring curl using Matrix cable system  BLE at 2.5 lb 2 sets of 12reps.  TKE using Matrix cable system; Right LE at 2.5 x 10 reps.   Step ups (6" block) with no UE support alt LE x 12 reps each Step down (6" block) with light UE touch left LE (to focus on  right LE eccentric control) x 12 steps.   Added Hip strengthening (Sidelye)  - Hip abd Right LE x 12 reps - Hip ER (clamshell) x 12 reps Right LE  Added to Home program:        Access Code: [email or web address] URL: [email or web address] Date: 08/06/2021 Prepared by: Sande Brothers  Exercises Clamshell - 1 x daily - 3 x weekly - 3 sets - 10 reps - 2 hold Sidelying Hip Abduction - 1 x daily - 3 x weekly - 3 sets - 10 reps   Manual therapy:    Grade II-III AP mobs femur on tibia, 30 sec bouts x3 sets; Knee flexed to ~60 degrees, PT performed AP mobs and PA mobs tibia on femur 20 sec bouts x3 sets each;   Measurement: 4 deg to 114 deg after treatment.   Ice to right knee in supine with Right LE elevated x 5 min (unbilled)    Patient presents with good motivation for today's session. She was able to perform several progressive Right LE strengthening exercises including TKE, Resistive ham curls, and sidelye hip strengthening without increased pain. She was responsive to all VC and able to perform correctly- Added hip strengthening to home program. She would benefit from additional skilled PT Intervention to improve strength, ROM and reduce pain with ADLs.       PT Education - 08/06/21 0754     Education Details Exercise technique    Person(s) Educated Patient    Methods Explanation;Demonstration;Tactile cues;Verbal cues    Comprehension Verbalized understanding;Returned demonstration;Verbal cues required;Tactile cues required;Need further instruction              PT Short Term Goals - 05/31/21 1112       PT SHORT TERM GOAL #1   Title Pt will demonstrate independence with HEP to improve R LE function for increased ability to participate with ADLs    Baseline 11/3: pt asking for review of HEP, confident and independent with HEP, not completing on days when she has higher levele of pain    Time 4    Period Weeks    Status Achieved    Target Date 04/30/21                PT Long Term Goals - 07/17/21 1016       PT LONG TERM GOAL #1   Title Patient will increase FOTO score to 53 to demonstrate predicted increase in functional mobility to complete ADLs    Baseline 34.5 1/17    Time  8    Period Weeks    Status Revised    Target Date 07/24/21      PT LONG TERM GOAL #2   Title Pt will perform 5XSTS in 12 seconds in order to demonstrate improved LE strength and decrease the likelihood of falling.    Baseline 04/02/21: 25; 11/3: 11 seconds    Time 12    Period Weeks    Status Achieved      PT LONG TERM GOAL #3   Title Pt will actively demonstrate 0 deg R knee extension and 120 deg R knee flexion in order to safely negotiate steps and normalize gait without AD.    Baseline 07/17/21: 6 extension, 120 flexion (PROM)    Time 8    Period Weeks    Status On-going    Target Date 07/24/21      PT LONG TERM GOAL #4   Title Pt will demonstrate SLS of RLE of at least 10sec to demonstrate decreased fall risk and PLOF of contralateral LE    Baseline 04/02/21 LLE 10sec RLE unable; 11/3: LLE 15 sec, RLE 11 sec    Time 8    Period Weeks    Status Achieved      PT LONG TERM GOAL #5   Title Pt will improve 6MWT to greater than 1300 feet in order to indicate improved community ambulation capacity.    Baseline 855 feet on 05/29/21 1180 on 07/17/21    Time 8    Period Weeks    Status New    Target Date 09/11/21                   Plan - 08/06/21 0755     Clinical Impression Statement Patient presents with good motivation for today's session. She was able to perform several progressive Right LE strengthening exercises including TKE, Resistive ham curls, and sidelye hip strengthening without increased pain. She was responsive to all VC and able to perform correctly- Added hip strengthening to home program. She would benefit from additional skilled PT Intervention to improve strength, ROM and reduce pain with ADLs.    Personal Factors and  Comorbidities Age;Comorbidity 3+    Comorbidities HTN, anxiety, depression    Examination-Activity Limitations Bed Mobility;Carry;Lift;Locomotion Level;Squat;Sit    Examination-Participation Restrictions Church;Cleaning;Community Activity;Laundry;Driving    Stability/Clinical Decision Making Evolving/Moderate complexity    Rehab Potential Good    PT Frequency 2x / week    PT Duration 12 weeks    PT Treatment/Interventions ADLs/Self Care Home Management;Gait training;Stair training;Functional mobility training;Therapeutic activities;Therapeutic exercise;Balance training;Patient/family education;Passive range of motion;Cryotherapy;Aquatic Therapy;Neuromuscular re-education;Manual techniques;Dry needling;Energy conservation;Scar mobilization;Electrical Stimulation    PT Next Visit Plan Manual Therapy for ROM/Pain relief as appropriate, Progressive LE strengthening and functional mobility training to normalize gait.    PT Home Exercise Plan 05/10/2021-Access Code: [email or web address]  URL: [email or web address]    Consulted and Agree with Plan of Care Patient             Patient will benefit from skilled therapeutic intervention in order to improve the following deficits and impairments:  Abnormal gait, Decreased activity tolerance, Decreased balance, Decreased endurance, Decreased knowledge of use of DME, Decreased mobility, Decreased strength, Decreased range of motion, Hypomobility, Increased muscle spasms, Impaired flexibility, Decreased skin integrity, Difficulty walking, Improper body mechanics, Pain  Visit Diagnosis: Difficulty in walking, not elsewhere classified  Muscle weakness (generalized)  Abnormality of gait and mobility  Unsteadiness on feet  Status post total right knee replacement  Problem List Patient Active Problem List   Diagnosis Date Noted   OA (osteoarthritis) of knee 03/26/2021   Primary osteoarthritis of right knee 03/26/2021   Leukocytosis  04/19/2020   Thyroid cyst 02/26/2018   Plantar fasciitis 02/19/2016   Family history of breast cancer in sister 03/10/2015   Breast cancer of lower-outer quadrant of right female breast ([place]) 02/10/2015   Abdominal bloating 12/16/2013   Edema 11/09/2013   Urge incontinence 05/05/2013   Atrophic vaginitis 05/05/2013   Syncope 02/15/2013   Irritable bowel syndrome 08/20/2012   Hearing loss 11/07/2011   Generalized anxiety disorder 11/07/2011   Peripheral neuropathy 07/05/2011   Osteoporosis 07/05/2011   Tinnitus, bilateral 04/05/2011   HYPERCHOLESTEROLEMIA 04/05/2010   ANEMIA, IRON DEFICIENCY 04/03/2010   DEPRESSION, MAJOR, MODERATE 04/03/2010   Essential hypertension 04/03/2010   ALLERGIC RHINITIS 04/03/2010   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/03/2010   POSTMENOPAUSAL SYNDROME 04/03/2010   DEGENERATIVE JOINT DISEASE, CERVICAL SPINE 04/03/2010   FIBROMYALGIA 04/03/2010    Lewis Moccasin, PT 08/06/2021, 9:29 AM  [place] MAIN [facility] SERVICES [address] [place], [place], [address] Phone: [phone]   Fax:  [phone]  Name: FARON WHITELOCK MRN: [id number] Date of Birth: 02-26-1951

## 2021-08-07 DIAGNOSIS — M797 Fibromyalgia: Secondary | ICD-10-CM | POA: Diagnosis not present

## 2021-08-07 DIAGNOSIS — E1169 Type 2 diabetes mellitus with other specified complication: Secondary | ICD-10-CM | POA: Diagnosis not present

## 2021-08-07 DIAGNOSIS — I10 Essential (primary) hypertension: Secondary | ICD-10-CM | POA: Diagnosis not present

## 2021-08-07 DIAGNOSIS — E559 Vitamin D deficiency, unspecified: Secondary | ICD-10-CM | POA: Diagnosis not present

## 2021-08-07 DIAGNOSIS — E785 Hyperlipidemia, unspecified: Secondary | ICD-10-CM | POA: Diagnosis not present

## 2021-08-07 DIAGNOSIS — I700 Atherosclerosis of aorta: Secondary | ICD-10-CM | POA: Diagnosis not present

## 2021-08-07 DIAGNOSIS — F419 Anxiety disorder, unspecified: Secondary | ICD-10-CM | POA: Diagnosis not present

## 2021-08-07 NOTE — Addendum Note (Signed)
Addended by: Hillis Range on: 08/07/2021 09:20 AM   Modules accepted: Orders

## 2021-08-09 ENCOUNTER — Other Ambulatory Visit: Payer: Self-pay

## 2021-08-09 ENCOUNTER — Ambulatory Visit: Payer: Medicare PPO

## 2021-08-09 DIAGNOSIS — R262 Difficulty in walking, not elsewhere classified: Secondary | ICD-10-CM

## 2021-08-09 DIAGNOSIS — Z96651 Presence of right artificial knee joint: Secondary | ICD-10-CM | POA: Diagnosis not present

## 2021-08-09 DIAGNOSIS — M6281 Muscle weakness (generalized): Secondary | ICD-10-CM

## 2021-08-09 DIAGNOSIS — R2689 Other abnormalities of gait and mobility: Secondary | ICD-10-CM | POA: Diagnosis not present

## 2021-08-09 DIAGNOSIS — G8929 Other chronic pain: Secondary | ICD-10-CM | POA: Diagnosis not present

## 2021-08-09 DIAGNOSIS — R2681 Unsteadiness on feet: Secondary | ICD-10-CM | POA: Diagnosis not present

## 2021-08-09 DIAGNOSIS — M25561 Pain in right knee: Secondary | ICD-10-CM | POA: Diagnosis not present

## 2021-08-09 DIAGNOSIS — R269 Unspecified abnormalities of gait and mobility: Secondary | ICD-10-CM

## 2021-08-09 DIAGNOSIS — R278 Other lack of coordination: Secondary | ICD-10-CM | POA: Diagnosis not present

## 2021-08-09 NOTE — Therapy (Signed)
[place] MAIN [facility] SERVICES [address] [place], [place], [address] Phone: [phone]   Fax:  [phone]  Physical Therapy Treatment  Patient Details  Name: Shannon Hancock MRN: [id number] Date of Birth: Nov 04, 1950 Referring Provider (PT): [name] (Sports medicine @ [facility])   Encounter Date: 08/09/2021   PT End of Session - 08/09/21 1026     Visit Number 37    Number of Visits 50    Date for PT Re-Evaluation 09/20/21    Authorization Type Auth through 12/2; progress note on 96/2, new cert 22/97-9/89/21, last progress note 1/17; Recert 1/94-1/74    PT Start Time 1018    PT Stop Time 1101    PT Time Calculation (min) 43 min    Equipment Utilized During Treatment Gait belt    Activity Tolerance Patient tolerated treatment well    Behavior During Therapy WFL for tasks assessed/performed             Past Medical History:  Diagnosis Date   Allergy    Anxiety    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; ER/PR+, Her2-, 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%   Depression    Diabetes mellitus without complication (HCC)    Diverticulosis    Fibromyalgia    GERD (gastroesophageal reflux disease)    Heart murmur    "no murmur" documented in PCP note by [name], MD 02/09/21   High cholesterol    Hypertension    Joint pain     Past Surgical History:  Procedure Laterality Date   BREAST LUMPECTOMY Right 2016   BREAST LUMPECTOMY WITH RADIOACTIVE SEED AND SENTINEL LYMPH NODE BIOPSY Right 02/13/2015   Procedure: RIGHT BREAST LUMPECTOMY WITH RADIOACTIVE SEED AND SENTINEL LYMPH NODE MAPPING;  Surgeon: [name], MD;  Location: [place];  Service: General;  Laterality: Right;   FOOT SURGERY Left    TOTAL KNEE ARTHROPLASTY Right 03/26/2021   Procedure: TOTAL KNEE ARTHROPLASTY;  Surgeon: [name], MD;  Location: [facility];  Service: Orthopedics;  Laterality: Right;   TUBAL LIGATION      There were no  vitals filed for this visit.   Subjective Assessment - 08/09/21 1024     Subjective I have some pain when I exercise but none particularly this morning.    Pertinent History Shannon Hancock is a 71 y.o. female presenting to physical therapy s/p R TKA 03/26/21. Pt reports to therapy amb with RW. Pt resides in a two story home with six steps to enter from the front and a ramp in the back. She reports negotiating stairs with assistance from her husband. She reports being able to negotiate and live on the main story of her home. Pt reports she would like to increase her R knee mobility, strength, and decrease her pain in order to walk without AD and go bowling. PMH includes breast cancer, fibromyalgia, DM, HTN, anxiety, depression, and hearing loss. Pt denies any unexplained weight fluctuation, saddle paresthesia, unrelenting night pain, or falls in the last 6 months.    Limitations Lifting;Walking;Standing;House hold activities    How long can you sit comfortably? 5-10 min    How long can you stand comfortably? 5 min    How long can you walk comfortably? 5-8 minutes    Patient Stated Goals Returned and bowling and restore ROM    Currently in Pain? No/denies    Pain Onset More than a month ago  INTERVENTIONS:        Nustep Interval workout:  Level 0 = 2 min Level 3= 1 min Level 1= 1 min Level 4= 1 min Total distance= 0.25 mi  RPE= 4/10   Seated hamstring curl BLE using RTB 2 sets of 10 reps. Wall minisquat 2 sets of 10 reps Standing donkey kick Supine with heel on 1/2 foam quad sets with 5 sec hold x 10        Hip strengthening ([place])  - Hip abd Right LE x  2 x 10 reps - Hip ER (clamshell) 2 x 10 reps Right LE with RTB                  Manual therapy:      Grade II-III AP mobs femur on tibia, 30 sec bouts x3 sets; Knee flexed to ~60 degrees, PT performed AP mobs and PA mobs tibia on femur 20 sec bouts x3 sets each;    Measurement: 4 deg to 122  deg after treatment.    Ice to right knee in supine with Right LE elevated x 5 min (unbilled)                             PT Education - 08/09/21 1025     Education Details Exercise technique    Person(s) Educated Patient    Methods Explanation;Demonstration;Tactile cues;Verbal cues    Comprehension Verbal cues required;Verbalized understanding;Returned demonstration;Tactile cues required;Need further instruction              PT Short Term Goals - 05/31/21 1112       PT SHORT TERM GOAL #1   Title Pt will demonstrate independence with HEP to improve R LE function for increased ability to participate with ADLs    Baseline 11/3: pt asking for review of HEP, confident and independent with HEP, not completing on days when she has higher levele of pain    Time 4    Period Weeks    Status Achieved    Target Date 04/30/21               PT Long Term Goals - 07/26/21 0916       PT LONG TERM GOAL #1   Title Patient will increase FOTO score to 53 to demonstrate predicted increase in functional mobility to complete ADLs    Baseline 34.5 1/17    Time 8    Period Weeks    Status Revised    Target Date 09/20/21      PT LONG TERM GOAL #2   Title Pt will perform 5XSTS in 12 seconds in order to demonstrate improved LE strength and decrease the likelihood of falling.    Baseline 04/02/21: 25; 11/3: 11 seconds    Time 12    Period Weeks    Status Achieved      PT LONG TERM GOAL #3   Title Pt will actively demonstrate 0 deg R knee extension and 120 deg R knee flexion in order to safely negotiate steps and normalize gait without AD.    Baseline 07/17/21: 6 extension, 120 flexion (PROM)    Time 8    Period Weeks    Status On-going    Target Date 09/20/21      PT LONG TERM GOAL #4   Title Pt will demonstrate SLS of RLE of at least 10sec to demonstrate decreased fall risk and PLOF of contralateral LE  Baseline 04/02/21 LLE 10sec RLE unable; 11/3: LLE 15 sec,  RLE 11 sec    Time 8    Period Weeks    Status Achieved      PT LONG TERM GOAL #5   Title Pt will improve 6MWT to greater than 1300 feet in order to indicate improved community ambulation capacity.    Baseline 855 feet on 05/29/21 1180 on 07/17/21    Time 8    Period Weeks    Status New    Target Date 09/20/21                   Plan - 08/09/21 1026     Clinical Impression Statement Patient presents with good motivation today and continues to progress with strengthening exercises- as seen by ability to perform wall squats and add resistance to clam shell today. She is still limited with knee ext and continues to focus on stretching and quad/hip strengthening. She would benefit from additional skilled PT Intervention to improve strength, ROM and reduce pain with ADLs    Personal Factors and Comorbidities Age;Comorbidity 3+    Comorbidities HTN, anxiety, depression    Examination-Activity Limitations Bed Mobility;Carry;Lift;Locomotion Level;Squat;Sit    Examination-Participation Restrictions Church;Cleaning;Community Activity;Laundry;Driving    Stability/Clinical Decision Making Evolving/Moderate complexity    Rehab Potential Good    PT Frequency 2x / week    PT Duration 8 weeks    PT Treatment/Interventions ADLs/Self Care Home Management;Gait training;Stair training;Functional mobility training;Therapeutic activities;Therapeutic exercise;Balance training;Patient/family education;Passive range of motion;Cryotherapy;Aquatic Therapy;Neuromuscular re-education;Manual techniques;Dry needling;Energy conservation;Scar mobilization;Electrical Stimulation    PT Next Visit Plan Manual Therapy for ROM/Pain relief as appropriate, Progressive LE strengthening and functional mobility training to normalize gait.    PT Home Exercise Plan 05/10/2021-Access Code: [email or web address]  URL: [email or web address]    Consulted and Agree with Plan of Care Patient             Patient will  benefit from skilled therapeutic intervention in order to improve the following deficits and impairments:  Abnormal gait, Decreased activity tolerance, Decreased balance, Decreased endurance, Decreased knowledge of use of DME, Decreased mobility, Decreased strength, Decreased range of motion, Hypomobility, Increased muscle spasms, Impaired flexibility, Decreased skin integrity, Difficulty walking, Improper body mechanics, Pain  Visit Diagnosis: Abnormality of gait and mobility  Difficulty in walking, not elsewhere classified  Muscle weakness (generalized)  Status post right knee replacement     Problem List Patient Active Problem List   Diagnosis Date Noted   OA (osteoarthritis) of knee 03/26/2021   Primary osteoarthritis of right knee 03/26/2021   Leukocytosis 04/19/2020   Thyroid cyst 02/26/2018   Plantar fasciitis 02/19/2016   Family history of breast cancer in sister 03/10/2015   Breast cancer of lower-outer quadrant of right female breast ([place]) 02/10/2015   Abdominal bloating 12/16/2013   Edema 11/09/2013   Urge incontinence 05/05/2013   Atrophic vaginitis 05/05/2013   Syncope 02/15/2013   Irritable bowel syndrome 08/20/2012   Hearing loss 11/07/2011   Generalized anxiety disorder 11/07/2011   Peripheral neuropathy 07/05/2011   Osteoporosis 07/05/2011   Tinnitus, bilateral 04/05/2011   HYPERCHOLESTEROLEMIA 04/05/2010   ANEMIA, IRON DEFICIENCY 04/03/2010   DEPRESSION, MAJOR, MODERATE 04/03/2010   Essential hypertension 04/03/2010   ALLERGIC RHINITIS 04/03/2010   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/03/2010   POSTMENOPAUSAL SYNDROME 04/03/2010   DEGENERATIVE JOINT DISEASE, CERVICAL SPINE 04/03/2010   FIBROMYALGIA 04/03/2010    Shannon Hancock, PT 08/09/2021, 6:26 PM  [place] [facility]  SERVICES [place], [place], [address] Phone: [phone]   Fax:  [phone]  Name: Shannon Hancock MRN:  [id number] Date of Birth: 13-Jun-1951

## 2021-08-14 ENCOUNTER — Ambulatory Visit: Payer: Medicare PPO

## 2021-08-14 ENCOUNTER — Other Ambulatory Visit: Payer: Self-pay

## 2021-08-14 ENCOUNTER — Ambulatory Visit: Payer: Medicare PPO | Admitting: Physical Therapy

## 2021-08-14 ENCOUNTER — Encounter: Payer: Self-pay | Admitting: Physical Therapy

## 2021-08-14 DIAGNOSIS — Z96651 Presence of right artificial knee joint: Secondary | ICD-10-CM | POA: Diagnosis not present

## 2021-08-14 DIAGNOSIS — R278 Other lack of coordination: Secondary | ICD-10-CM | POA: Diagnosis not present

## 2021-08-14 DIAGNOSIS — R2681 Unsteadiness on feet: Secondary | ICD-10-CM

## 2021-08-14 DIAGNOSIS — G8929 Other chronic pain: Secondary | ICD-10-CM | POA: Diagnosis not present

## 2021-08-14 DIAGNOSIS — R262 Difficulty in walking, not elsewhere classified: Secondary | ICD-10-CM

## 2021-08-14 DIAGNOSIS — M6281 Muscle weakness (generalized): Secondary | ICD-10-CM

## 2021-08-14 DIAGNOSIS — R2689 Other abnormalities of gait and mobility: Secondary | ICD-10-CM | POA: Diagnosis not present

## 2021-08-14 DIAGNOSIS — R269 Unspecified abnormalities of gait and mobility: Secondary | ICD-10-CM | POA: Diagnosis not present

## 2021-08-14 DIAGNOSIS — M25561 Pain in right knee: Secondary | ICD-10-CM | POA: Diagnosis not present

## 2021-08-14 NOTE — Therapy (Signed)
[place] MAIN [facility] SERVICES [address] [place], [place], [address] Phone: [phone]   Fax:  [phone]  Physical Therapy Treatment  Patient Details  Name: Shannon Hancock MRN: [id number] Date of Birth: 1951-03-12 Referring Provider (PT): [name] (Sports medicine @ [facility])   Encounter Date: 08/14/2021   PT End of Session - 08/14/21 1026     Visit Number 38    Number of Visits 50    Date for PT Re-Evaluation 09/20/21    Authorization Type Auth through 12/2; progress note on 93/2, new cert 67/12-4/58/09, last progress note 1/17; Recert 9/83-3/82    PT Start Time 1018    PT Stop Time 1100    PT Time Calculation (min) 42 min    Equipment Utilized During Treatment Gait belt    Activity Tolerance Patient tolerated treatment well    Behavior During Therapy WFL for tasks assessed/performed             Past Medical History:  Diagnosis Date   Allergy    Anxiety    Breast cancer ([place]) 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; ER/PR+, Her2-, 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%   Depression    Diabetes mellitus without complication (HCC)    Diverticulosis    Fibromyalgia    GERD (gastroesophageal reflux disease)    Heart murmur    "no murmur" documented in PCP note by [name], MD 02/09/21   High cholesterol    Hypertension    Joint pain     Past Surgical History:  Procedure Laterality Date   BREAST LUMPECTOMY Right 2016   BREAST LUMPECTOMY WITH RADIOACTIVE SEED AND SENTINEL LYMPH NODE BIOPSY Right 02/13/2015   Procedure: RIGHT BREAST LUMPECTOMY WITH RADIOACTIVE SEED AND SENTINEL LYMPH NODE MAPPING;  Surgeon: [name], MD;  Location: [place];  Service: General;  Laterality: Right;   FOOT SURGERY Left    TOTAL KNEE ARTHROPLASTY Right 03/26/2021   Procedure: TOTAL KNEE ARTHROPLASTY;  Surgeon: [name], MD;  [facility];  Service: Orthopedics;  Laterality: Right;   TUBAL LIGATION      There were no  vitals filed for this visit.   Subjective Assessment - 08/14/21 1024     Subjective I am not having any pain currently. She feels like the knee is still stiff. She reports its usually good in the morning but will swell as the day progresses; She is wanting to get new shoes to help improve arch and reduce collapsed arch;    Pertinent History Shannon Hancock is a 71 y.o. female presenting to physical therapy s/p R TKA 03/26/21. Pt reports to therapy amb with RW. Pt resides in a two story home with six steps to enter from the front and a ramp in the back. She reports negotiating stairs with assistance from her husband. She reports being able to negotiate and live on the main story of her home. Pt reports she would like to increase her R knee mobility, strength, and decrease her pain in order to walk without AD and go bowling. PMH includes breast cancer, fibromyalgia, DM, HTN, anxiety, depression, and hearing loss. Pt denies any unexplained weight fluctuation, saddle paresthesia, unrelenting night pain, or falls in the last 6 months.    Limitations Lifting;Walking;Standing;House hold activities    How long can you sit comfortably? 5-10 min    How long can you stand comfortably? 5 min    How long can you walk comfortably? 5-8 minutes  Patient Stated Goals Returned and bowling and restore ROM    Currently in Pain? No/denies    Pain Onset More than a month ago    Multiple Pain Sites No                  TREATMENT: Warm up on Nustep BLE level 2 x4 min (Unbilled) Seat progressed from #8 to #9 to facilitate increased knee extension,   Leg press: BLE plate 55# , 4G81 reps with staggered position (RLE bent more than LLE to increase push in RLE)- moderate difficulty reported;  RLE only 25# x12 with moderate difficulty reported, required min VCs to increase terminal knee extension;    Standing in parallel bars: -forward step up to 6 inch step unsupported x10 reps, leading with RLE to challenge  quad control; - side step up on 6 inch step unsupported leading with RW x10 reps with CGA for safety and cues to increase step length for better foot clearance;  Seated Matrix RLE hamstring curl 7.5# x10 reps Standing RLE terminal knee extension 7.5# x10 reps;  Patient reports minimal difficulty. She denies any increase in knee pain;    Patient supine: Manual therapy: PT performed grade II-III AP mobs femur on tibia in supine, 20 sec bouts x3 sets; PT Performed passive hamstring stretch with overpressure 30 sec hold x2 reps; Patient denies any posterior knee stretch  but reports soreness in anterior right knee;       Patient finished session with cryotherapy in supine x4 min with LE extended to facilitate better knee extension;    Pt educated throughout session about proper posture and technique with exercises. Improved exercise technique, movement at target joints, use of target muscles after min to mod verbal, visual, tactile cues.   Tolerated session fair. Patient continues to have soreness in RLE (2-3/10) with advanced exercise. Does report increased fatigue at end of session;  Reinforced HEP                                  PT Education - 08/14/21 1026     Education Details exercise technique;    Person(s) Educated Patient    Methods Explanation;Verbal cues    Comprehension Verbalized understanding;Returned demonstration;Verbal cues required;Need further instruction              PT Short Term Goals - 05/31/21 1112       PT SHORT TERM GOAL #1   Title Pt will demonstrate independence with HEP to improve R LE function for increased ability to participate with ADLs    Baseline 11/3: pt asking for review of HEP, confident and independent with HEP, not completing on days when she has higher levele of pain    Time 4    Period Weeks    Status Achieved    Target Date 04/30/21               PT Long Term Goals - 07/26/21 0916       PT LONG  TERM GOAL #1   Title Patient will increase FOTO score to 53 to demonstrate predicted increase in functional mobility to complete ADLs    Baseline 34.5 1/17    Time 8    Period Weeks    Status Revised    Target Date 09/20/21      PT LONG TERM GOAL #2   Title Pt will perform 5XSTS in 12 seconds in order  to demonstrate improved LE strength and decrease the likelihood of falling.    Baseline 04/02/21: 25; 11/3: 11 seconds    Time 12    Period Weeks    Status Achieved      PT LONG TERM GOAL #3   Title Pt will actively demonstrate 0 deg R knee extension and 120 deg R knee flexion in order to safely negotiate steps and normalize gait without AD.    Baseline 07/17/21: 6 extension, 120 flexion (PROM)    Time 8    Period Weeks    Status On-going    Target Date 09/20/21      PT LONG TERM GOAL #4   Title Pt will demonstrate SLS of RLE of at least 10sec to demonstrate decreased fall risk and PLOF of contralateral LE    Baseline 04/02/21 LLE 10sec RLE unable; 11/3: LLE 15 sec, RLE 11 sec    Time 8    Period Weeks    Status Achieved      PT LONG TERM GOAL #5   Title Pt will improve 6MWT to greater than 1300 feet in order to indicate improved community ambulation capacity.    Baseline 855 feet on 05/29/21 1180 on 07/17/21    Time 8    Period Weeks    Status New    Target Date 09/20/21                   Plan - 08/14/21 1026     Clinical Impression Statement Patient continues to be concerned over limited ROM in right knee, particularly in extension. PT instructed patient in advanced LE strengthening exercise to facilitate better knee extension ROM/quad control. She reports min-mod difficulty with advanced exercise. Patient does require min Vcs for proper exercise technique. She sometimes compensates for limited ROM with hip rotation to make exercise easier. Patient does report increased soreness along anterior right knee during joint mobilizations. When PT performed passive hamstring  stretch she denies any increased in tension in posterior leg but reports feeling it in anterior knee. She would benefit from additional skilled PT intervention to improve strength, ROM and reduce pain with ADLs.    Personal Factors and Comorbidities Age;Comorbidity 3+    Comorbidities HTN, anxiety, depression    Examination-Activity Limitations Bed Mobility;Carry;Lift;Locomotion Level;Squat;Sit    Examination-Participation Restrictions Church;Cleaning;Community Activity;Laundry;Driving    Stability/Clinical Decision Making Evolving/Moderate complexity    Rehab Potential Good    PT Frequency 2x / week    PT Duration 8 weeks    PT Treatment/Interventions ADLs/Self Care Home Management;Gait training;Stair training;Functional mobility training;Therapeutic activities;Therapeutic exercise;Balance training;Patient/family education;Passive range of motion;Cryotherapy;Aquatic Therapy;Neuromuscular re-education;Manual techniques;Dry needling;Energy conservation;Scar mobilization;Electrical Stimulation    PT Next Visit Plan Manual Therapy for ROM/Pain relief as appropriate, Progressive LE strengthening and functional mobility training to normalize gait.    PT Home Exercise Plan 05/10/2021-Access Code: [email or web address]  URL: [email or web address]    Consulted and Agree with Plan of Care Patient             Patient will benefit from skilled therapeutic intervention in order to improve the following deficits and impairments:  Abnormal gait, Decreased activity tolerance, Decreased balance, Decreased endurance, Decreased knowledge of use of DME, Decreased mobility, Decreased strength, Decreased range of motion, Hypomobility, Increased muscle spasms, Impaired flexibility, Decreased skin integrity, Difficulty walking, Improper body mechanics, Pain  Visit Diagnosis: Abnormality of gait and mobility  Difficulty in walking, not elsewhere classified  Muscle weakness (generalized)  Status post right knee  replacement  Unsteadiness  on feet     Problem List Patient Active Problem List   Diagnosis Date Noted   OA (osteoarthritis) of knee 03/26/2021   Primary osteoarthritis of right knee 03/26/2021   Leukocytosis 04/19/2020   Thyroid cyst 02/26/2018   Plantar fasciitis 02/19/2016   Family history of breast cancer in sister 03/10/2015   Breast cancer of lower-outer quadrant of right female breast ([place]) 02/10/2015   Abdominal bloating 12/16/2013   Edema 11/09/2013   Urge incontinence 05/05/2013   Atrophic vaginitis 05/05/2013   Syncope 02/15/2013   Irritable bowel syndrome 08/20/2012   Hearing loss 11/07/2011   Generalized anxiety disorder 11/07/2011   Peripheral neuropathy 07/05/2011   Osteoporosis 07/05/2011   Tinnitus, bilateral 04/05/2011   HYPERCHOLESTEROLEMIA 04/05/2010   ANEMIA, IRON DEFICIENCY 04/03/2010   DEPRESSION, MAJOR, MODERATE 04/03/2010   Essential hypertension 04/03/2010   ALLERGIC RHINITIS 04/03/2010   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/03/2010   POSTMENOPAUSAL SYNDROME 04/03/2010   DEGENERATIVE JOINT DISEASE, CERVICAL SPINE 04/03/2010   FIBROMYALGIA 04/03/2010    Roberts Bon, PT, DPT 08/14/2021, 11:06 AM  [place] [address] [place], [place], [address] Phone: [phone]   Fax:  [phone]  Name: Shannon Hancock MRN: [id number] Date of Birth: [birth year]

## 2021-08-16 ENCOUNTER — Ambulatory Visit
Admission: RE | Admit: 2021-08-16 | Discharge: 2021-08-16 | Disposition: A | Discharge status: Home / Self Care | Payer: Medicare PPO | Source: Ambulatory Visit | Attending: Otolaryngology | Admitting: Otolaryngology

## 2021-08-16 DIAGNOSIS — H9042 Sensorineural hearing loss, unilateral, left ear, with unrestricted hearing on the contralateral side: Secondary | ICD-10-CM

## 2021-08-16 MED ORDER — GADOBENATE DIMEGLUMINE 529 MG/ML IV SOLN
15.0000 mL | Freq: Once | INTRAVENOUS | Status: AC | PRN
  Administered 2021-08-16: 15 mL via INTRAVENOUS

## 2021-08-17 ENCOUNTER — Ambulatory Visit: Payer: Medicare PPO | Admitting: Physical Therapy

## 2021-08-17 ENCOUNTER — Other Ambulatory Visit: Payer: Self-pay

## 2021-08-17 DIAGNOSIS — Z96651 Presence of right artificial knee joint: Secondary | ICD-10-CM | POA: Diagnosis not present

## 2021-08-17 DIAGNOSIS — R278 Other lack of coordination: Secondary | ICD-10-CM | POA: Diagnosis not present

## 2021-08-17 DIAGNOSIS — R269 Unspecified abnormalities of gait and mobility: Secondary | ICD-10-CM | POA: Diagnosis not present

## 2021-08-17 DIAGNOSIS — R2681 Unsteadiness on feet: Secondary | ICD-10-CM | POA: Diagnosis not present

## 2021-08-17 DIAGNOSIS — G8929 Other chronic pain: Secondary | ICD-10-CM | POA: Diagnosis not present

## 2021-08-17 DIAGNOSIS — M6281 Muscle weakness (generalized): Secondary | ICD-10-CM | POA: Diagnosis not present

## 2021-08-17 DIAGNOSIS — R262 Difficulty in walking, not elsewhere classified: Secondary | ICD-10-CM

## 2021-08-17 DIAGNOSIS — R2689 Other abnormalities of gait and mobility: Secondary | ICD-10-CM | POA: Diagnosis not present

## 2021-08-17 DIAGNOSIS — M25561 Pain in right knee: Secondary | ICD-10-CM | POA: Diagnosis not present

## 2021-08-17 NOTE — Therapy (Signed)
[place] MAIN [facility] SERVICES [address] [place], [place], [address] Phone: [phone]   Fax:  [phone]  Physical Therapy Treatment  Patient Details  Name: Shannon Hancock MRN: [id number] Date of Birth: 29-May-1951 Referring Provider (PT): [name] ([facility] medicine @ [facility])   Encounter Date: 08/17/2021   PT End of Session - 08/17/21 1050     Visit Number 39    Number of Visits 50    Date for PT Re-Evaluation 09/20/21    Authorization Type Auth through 12/2; progress note on 92/0, new cert 10/07-1/21/97, last progress note 1/17; Recert 5/88-3/25    Progress Note Due on Visit 40    PT Start Time 1044    PT Stop Time 1130    PT Time Calculation (min) 46 min    Equipment Utilized During Treatment Gait belt    Activity Tolerance Patient tolerated treatment well    Behavior During Therapy WFL for tasks assessed/performed             Past Medical History:  Diagnosis Date   Allergy    Anxiety    Breast cancer ([place]) 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; ER/PR+, Her2-, 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%   Depression    Diabetes mellitus without complication (HCC)    Diverticulosis    Fibromyalgia    GERD (gastroesophageal reflux disease)    Heart murmur    "no murmur" documented in PCP note by [name], MD 02/09/21   High cholesterol    Hypertension    Joint pain     Past Surgical History:  Procedure Laterality Date   BREAST LUMPECTOMY Right 2016   BREAST LUMPECTOMY WITH RADIOACTIVE SEED AND SENTINEL LYMPH NODE BIOPSY Right 02/13/2015   Procedure: RIGHT BREAST LUMPECTOMY WITH RADIOACTIVE SEED AND SENTINEL LYMPH NODE MAPPING;  Surgeon: [name], MD;  Location: [place];  Service: General;  Laterality: Right;   FOOT SURGERY Left    TOTAL KNEE ARTHROPLASTY Right 03/26/2021   Procedure: TOTAL KNEE ARTHROPLASTY;  Surgeon: [name], MD;  Location: [facility];  Service: Orthopedics;  Laterality: Right;   TUBAL  LIGATION      There were no vitals filed for this visit.   Subjective Assessment - 08/17/21 1047     Subjective Pt reports she is doing well. No significant changes since last.    Pertinent History Shannon Hancock is a 71 y.o. female presenting to physical therapy s/p R TKA 03/26/21. Pt reports to therapy amb with RW. Pt resides in a two story home with six steps to enter from the front and a ramp in the back. She reports negotiating stairs with assistance from her husband. She reports being able to negotiate and live on the main story of her home. Pt reports she would like to increase her R knee mobility, strength, and decrease her pain in order to walk without AD and go bowling. PMH includes breast cancer, fibromyalgia, DM, HTN, anxiety, depression, and hearing loss. Pt denies any unexplained weight fluctuation, saddle paresthesia, unrelenting night pain, or falls in the last 6 months.    Limitations Lifting;Walking;Standing;House hold activities    How long can you sit comfortably? 5-10 min    How long can you stand comfortably? 5 min    How long can you walk comfortably? 5-8 minutes    Patient Stated Goals Returned and bowling and restore ROM    Pain Score 4     Pain Location Knee  Pain Orientation Right    Pain Descriptors / Indicators Nagging    Pain Onset More than a month ago              TREATMENT: Warm up on Nustep BLE level 2 x4 min (Unbilled)   Leg press: BLE plate 55# , 1E75 reps with staggered position (RLE bent more than LLE to increase push in RLE)- moderate difficulty reported;  RLE only 25# 2x12 with moderate difficulty reported, required min VCs to increase terminal knee extension;  LAQ 2 x 10 x 5 sec hold for quad and knee extension strength - rated hard by pt  Standing at steps  -forward step up to 6 inch step unsupported x10 reps, leading with RLE to challenge quad control; - side step up on 6 inch step unsupported leading with RW x10 reps with CGA for  safety and cues to increase step length for better foot clearance;    Patient supine: Manual therapy: PT performed grade II-III AP mobs femur on tibia in supine in multiple ranges of motion in addition to patellar glides (superior in tke) with 20-30 sec holds approximately, multiple reps.  PT Performed passive hamstring stretch with overpressure 30 sec hold x2 reps; Overpressure with knee propped at heel in supine x multiple reps level 3-4 mobilizations throughout between holds.  Pt has some discomfort in the patellar tendon with overpressure into extension, addressed with Cross friction massage (CFM) to this area and pt noted improved pain in this area following. Pt educated regarding CFM to this area and was instructed in how to find the quad tendon.        Patient finished session with cryotherapy in supine x4 ( unbilled) min with LE extended to facilitate better knee extension;    Pt educated throughout session about proper posture and technique with exercises. Improved exercise technique, movement at target joints, use of target muscles after min to mod verbal, visual, tactile cues.                                 PT Short Term Goals - 05/31/21 1112       PT SHORT TERM GOAL #1   Title Pt will demonstrate independence with HEP to improve R LE function for increased ability to participate with ADLs    Baseline 11/3: pt asking for review of HEP, confident and independent with HEP, not completing on days when she has higher levele of pain    Time 4    Period Weeks    Status Achieved    Target Date 04/30/21               PT Long Term Goals - 07/26/21 0916       PT LONG TERM GOAL #1   Title Patient will increase FOTO score to 53 to demonstrate predicted increase in functional mobility to complete ADLs    Baseline 34.5 1/17    Time 8    Period Weeks    Status Revised    Target Date 09/20/21      PT LONG TERM GOAL #2   Title Pt will perform 5XSTS  in 12 seconds in order to demonstrate improved LE strength and decrease the likelihood of falling.    Baseline 04/02/21: 25; 11/3: 11 seconds    Time 12    Period Weeks    Status Achieved      PT LONG TERM GOAL #3  Title Pt will actively demonstrate 0 deg R knee extension and 120 deg R knee flexion in order to safely negotiate steps and normalize gait without AD.    Baseline 07/17/21: 6 extension, 120 flexion (PROM)    Time 8    Period Weeks    Status On-going    Target Date 09/20/21      PT LONG TERM GOAL #4   Title Pt will demonstrate SLS of RLE of at least 10sec to demonstrate decreased fall risk and PLOF of contralateral LE    Baseline 04/02/21 LLE 10sec RLE unable; 11/3: LLE 15 sec, RLE 11 sec    Time 8    Period Weeks    Status Achieved      PT LONG TERM GOAL #5   Title Pt will improve 6MWT to greater than 1300 feet in order to indicate improved community ambulation capacity.    Baseline 855 feet on 05/29/21 1180 on 07/17/21    Time 8    Period Weeks    Status New    Target Date 09/20/21                   Plan - 08/17/21 1051     Clinical Impression Statement Pt presents with good motivation for copmletion of Pt program this date. Continued wiht interventions to target right kne TKE and pt tolerated well with less need for VC and TC this sessioncopmared with tprevious sessions. Pt was instructed she will be assessed with her progress note next visit. Pt making good progress at this time. She would benefit from additional skilled PT intervention to improve strength, ROM and reduce pain with ADLs.    Personal Factors and Comorbidities Age;Comorbidity 3+    Comorbidities HTN, anxiety, depression    Examination-Activity Limitations Bed Mobility;Carry;Lift;Locomotion Level;Squat;Sit    Examination-Participation Restrictions Church;Cleaning;Community Activity;Laundry;Driving    Stability/Clinical Decision Making Evolving/Moderate complexity    Rehab Potential Good    PT  Frequency 2x / week    PT Duration 8 weeks    PT Treatment/Interventions ADLs/Self Care Home Management;Gait training;Stair training;Functional mobility training;Therapeutic activities;Therapeutic exercise;Balance training;Patient/family education;Passive range of motion;Cryotherapy;Aquatic Therapy;Neuromuscular re-education;Manual techniques;Dry needling;Energy conservation;Scar mobilization;Electrical Stimulation    PT Next Visit Plan Manual Therapy for ROM/Pain relief as appropriate, Progressive LE strengthening and functional mobility training to normalize gait.    PT Home Exercise Plan 05/10/2021-Access Code: [email or web address]  URL: [email or web address]    Consulted and Agree with Plan of Care Patient             Patient will benefit from skilled therapeutic intervention in order to improve the following deficits and impairments:  Abnormal gait, Decreased activity tolerance, Decreased balance, Decreased endurance, Decreased knowledge of use of DME, Decreased mobility, Decreased strength, Decreased range of motion, Hypomobility, Increased muscle spasms, Impaired flexibility, Decreased skin integrity, Difficulty walking, Improper body mechanics, Pain  Visit Diagnosis: Abnormality of gait and mobility  Difficulty in walking, not elsewhere classified  Status post right knee replacement     Problem List Patient Active Problem List   Diagnosis Date Noted   OA (osteoarthritis) of knee 03/26/2021   Primary osteoarthritis of right knee 03/26/2021   Leukocytosis 04/19/2020   Thyroid cyst 02/26/2018   Plantar fasciitis 02/19/2016   Family history of breast cancer in sister 03/10/2015   Breast cancer of lower-outer quadrant of right female breast ([place]) 02/10/2015   Abdominal bloating 12/16/2013   Edema 11/09/2013   Urge incontinence 05/05/2013   Atrophic vaginitis 05/05/2013   Syncope 02/15/2013  Irritable bowel syndrome 08/20/2012   Hearing loss 11/07/2011   Generalized  anxiety disorder 11/07/2011   Peripheral neuropathy 07/05/2011   Osteoporosis 07/05/2011   Tinnitus, bilateral 04/05/2011   HYPERCHOLESTEROLEMIA 04/05/2010   ANEMIA, IRON DEFICIENCY 04/03/2010   DEPRESSION, MAJOR, MODERATE 04/03/2010   Essential hypertension 04/03/2010   ALLERGIC RHINITIS 04/03/2010   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/03/2010   POSTMENOPAUSAL SYNDROME 04/03/2010   DEGENERATIVE JOINT DISEASE, CERVICAL SPINE 04/03/2010   FIBROMYALGIA 04/03/2010    Particia Lather, PT 08/17/2021, 11:38 AM  [place] MAIN [facility] SERVICES [address] [place], [place], [address] Phone: [phone]   Fax:  [phone]  Name: Shannon Hancock MRN: [id number] Date of Birth: 09/09/1950

## 2021-08-21 ENCOUNTER — Ambulatory Visit: Payer: Medicare PPO | Admitting: Physical Therapy

## 2021-08-21 ENCOUNTER — Other Ambulatory Visit: Payer: Self-pay

## 2021-08-21 DIAGNOSIS — R2689 Other abnormalities of gait and mobility: Secondary | ICD-10-CM | POA: Diagnosis not present

## 2021-08-21 DIAGNOSIS — R269 Unspecified abnormalities of gait and mobility: Secondary | ICD-10-CM | POA: Diagnosis not present

## 2021-08-21 DIAGNOSIS — M25561 Pain in right knee: Secondary | ICD-10-CM | POA: Diagnosis not present

## 2021-08-21 DIAGNOSIS — R262 Difficulty in walking, not elsewhere classified: Secondary | ICD-10-CM | POA: Diagnosis not present

## 2021-08-21 DIAGNOSIS — Z96651 Presence of right artificial knee joint: Secondary | ICD-10-CM | POA: Diagnosis not present

## 2021-08-21 DIAGNOSIS — M6281 Muscle weakness (generalized): Secondary | ICD-10-CM | POA: Diagnosis not present

## 2021-08-21 DIAGNOSIS — R278 Other lack of coordination: Secondary | ICD-10-CM | POA: Diagnosis not present

## 2021-08-21 DIAGNOSIS — R350 Frequency of micturition: Secondary | ICD-10-CM | POA: Diagnosis not present

## 2021-08-21 DIAGNOSIS — G8929 Other chronic pain: Secondary | ICD-10-CM | POA: Diagnosis not present

## 2021-08-21 DIAGNOSIS — R2681 Unsteadiness on feet: Secondary | ICD-10-CM | POA: Diagnosis not present

## 2021-08-21 NOTE — Therapy (Signed)
[place] MAIN [facility] SERVICES [address] [place], [place], [address] Phone: [phone]   Fax:  [phone]  Physical Therapy Treatment/Physical Therapy Progress Note   Dates of reporting period  07/17/21   to   08/21/21   Patient Details  Name: Shannon Hancock MRN: [id number] Date of Birth: [birth year] Referring Provider (PT): Shannon Hearing MD (Sports medicine @ [facility])   Encounter Date: 08/21/2021   PT End of Session - 08/21/21 0926     Visit Number 40    Number of Visits 50    Date for PT Re-Evaluation 09/20/21    Authorization Type Auth through 12/2; progress note on 44/6, new cert 28/63-8/17/71, last progress note 1/17; Recert 1/65-7/90    Progress Note Due on Visit 40    PT Start Time 0928    PT Stop Time 1012    PT Time Calculation (min) 44 min    Equipment Utilized During Treatment Gait belt    Activity Tolerance Patient tolerated treatment well    Behavior During Therapy WFL for tasks assessed/performed             Past Medical History:  Diagnosis Date   Allergy    Anxiety    Breast cancer ([place] [facility]) 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; ER/PR+, Her2-, 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%   Depression    Diabetes mellitus without complication (HCC)    Diverticulosis    Fibromyalgia    GERD (gastroesophageal reflux disease)    Heart murmur    "no murmur" documented in PCP note by Shannon Stains, MD 02/09/21   High cholesterol    Hypertension    Joint pain     Past Surgical History:  Procedure Laterality Date   BREAST LUMPECTOMY Right 2016   BREAST LUMPECTOMY WITH RADIOACTIVE SEED AND SENTINEL LYMPH NODE BIOPSY Right 02/13/2015   Procedure: RIGHT BREAST LUMPECTOMY WITH RADIOACTIVE SEED AND SENTINEL LYMPH NODE MAPPING;  Surgeon: [name], MD;  Location: [facility];  Service: General;  Laterality: Right;   FOOT SURGERY Left    TOTAL KNEE ARTHROPLASTY Right 03/26/2021   Procedure: TOTAL KNEE ARTHROPLASTY;  Surgeon:  [name], MD;  Location[facility];  Service: Orthopedics;  Laterality: Right;   TUBAL LIGATION      There were no vitals filed for this visit.   Subjective Assessment - 08/21/21 0930     Subjective Pt reports her knee is a little swollen following increase in activity over the past weekend at church. Pt reports she is still hesitant to complete activities at home due to swelling occurring following but was encouraged to complete activities as she is able and swelling is a normal response at this time.    Pertinent History Shannon Hancock is a 71 y.o. female presenting to physical therapy s/p R TKA 03/26/21. Pt reports to therapy amb with RW. Pt resides in a two story home with six steps to enter from the front and a ramp in the back. She reports negotiating stairs with assistance from her husband. She reports being able to negotiate and live on the main story of her home. Pt reports she would like to increase her R knee mobility, strength, and decrease her pain in order to walk without AD and go bowling. PMH includes breast cancer, fibromyalgia, DM, HTN, anxiety, depression, and Hancock loss. Pt denies any unexplained weight fluctuation, saddle paresthesia, unrelenting night pain, or falls in the last 6 months.    Limitations Lifting;Walking;Standing;Hancock hold  activities    How long can you sit comfortably? 5-10 min    How long can you stand comfortably? 5 min    How long can you walk comfortably? 5-8 minutes    Patient Stated Goals Returned and bowling and restore ROM    Pain Onset More than a month ago             Exercise/Activity Sets/Reps/Time/ Management consultant type Comments  6MWT   Therex 1365 feet, not achieving TKE in R LE, improved hip orientation since last trial   Manual therapy    Manual  PT performed grade II-III AP mobs femur on tibia in supine in multiple ranges of motion in addition to patellar glides (superior in tke) with 20-30 sec holds approximately,  multiple reps.  PT Performed passive hamstring stretch with overpressure 30 sec hold x2 reps; Overpressure with knee propped at heel in supine x multiple reps level 3-4 mobilizations throughout between holds.  Pt has some discomfort in the patellar tendon with overpressure into extension, addressed with Cross friction massage (CFM) to this area and pt noted improved pain in this area following.   STS  2 x 5    18.53 sec trial 1  13.4 sec trial 2   Nustep  X 5 min   No charge  To improve knee rom and mobility prior to functional measures  Ice  X 4 min   No charge   To knee for pain and swelling management   FOTO    55  Knee ROM (R) in supine with heel propped     Flex 120  Ext 3 degrees from full extension                           Treatment Provided this session   Pt educated throughout session about proper posture and technique with exercises. Improved exercise technique, movement at target joints, use of target muscles after min to mod verbal, visual, tactile cues. Note: Portions of this document were prepared using Dragon voice recognition software and although reviewed may contain unintentional dictation errors in syntax, grammar, or spelling.          Pt educated throughout session about proper posture and technique with exercises. Improved exercise technique, movement at target joints, use of target muscles after min to mod verbal, visual, tactile cues.                          PT Education - 08/21/21 1209     Education Details Progress, plan for therapy moving forward    Person(s) Educated Patient    Methods Explanation    Comprehension Verbalized understanding              PT Short Term Goals - 05/31/21 1112       PT SHORT TERM GOAL #1   Title Pt will demonstrate independence with HEP to improve R LE function for increased ability to participate with ADLs    Baseline 11/3: pt asking for review of HEP, confident and independent with HEP, not  completing on days when she has higher levele of pain    Time 4    Period Weeks    Status Achieved    Target Date 04/30/21               PT Long Term Goals - 08/21/21 1214       PT LONG  TERM GOAL #1   Title Patient will increase FOTO score to 53 to demonstrate predicted increase in functional mobility to complete ADLs    Baseline 34.5 1/17 55 on 2/21    Time 8    Period Weeks    Status Revised    Target Date 09/20/21      PT LONG TERM GOAL #2   Title Pt will perform 5XSTS in 12 seconds in order to demonstrate improved LE strength and decrease the likelihood of falling.    Baseline 04/02/21: 25; 11/3: 11 seconds    Time 12    Period Weeks    Status Achieved      PT LONG TERM GOAL #3   Title Pt will actively demonstrate 0 deg R knee extension and 120 deg R knee flexion in order to safely negotiate steps and normalize gait without AD.    Baseline 07/17/21: 6 extension, 120 flexion (PROM) 3 degrees extension, 120 fexion    Time 8    Period Weeks    Status On-going    Target Date 09/20/21      PT LONG TERM GOAL #4   Title Pt will demonstrate SLS of RLE of at least 10sec to demonstrate decreased fall risk and PLOF of contralateral LE    Baseline 04/02/21 LLE 10sec RLE unable; 11/3: LLE 15 sec, RLE 11 sec    Time 8    Period Weeks    Status Achieved      PT LONG TERM GOAL #5   Title Pt will improve 6MWT to greater than 1300 feet in order to indicate improved community ambulation capacity.    Baseline 855 feet on 05/29/21 1180 on 07/17/21 1365 on 2/21    Time 8    Period Weeks    Status New    Target Date 09/20/21                   Plan - 08/21/21 7371     Clinical Impression Statement Pt presents to therpay for progress note. Pt has made significant progress with her overall function as well as her strength to this point. Pt has now met her endurance goal with her 6 minute walk test indicating ability to ambulate at normal speed for community ambulation  distances. Pt still lacks terminal knee extensiton with gait as wel as with measurements and this continues to be targeted wit manual therpay and therapeitic exercise and functional interventions. Following her next visit pt will transition to one visit per week and will be instructed in progressive gym program where she can continue to make functional gains in endurance and knee motion following discharge from skilled PT intervention. Pt happy and agreeable with this plan. Next visit will instruct pt in use of gym equipment in well zone. Pt will continue to benefit from skilled PT intervention in order to improve her knee ROM and overall strength to improve her mobility and QOL. Patient's condition has the potential to improve in response to therapy. Maximum improvement is yet to be obtained. The anticipated improvement is attainable and reasonable in a generally predictable time.      Personal Factors and Comorbidities Age;Comorbidity 3+    Comorbidities HTN, anxiety, depression    Examination-Activity Limitations Bed Mobility;Carry;Lift;Locomotion Level;Squat;Sit    Examination-Participation Restrictions Church;Cleaning;Community Activity;Laundry;Driving    Stability/Clinical Decision Making Evolving/Moderate complexity    Rehab Potential Good    PT Frequency 2x / week    PT Duration 8 weeks    PT  Treatment/Interventions ADLs/Self Care Home Management;Gait training;Stair training;Functional mobility training;Therapeutic activities;Therapeutic exercise;Balance training;Patient/family education;Passive range of motion;Cryotherapy;Aquatic Therapy;Neuromuscular re-education;Manual techniques;Dry needling;Energy conservation;Scar mobilization;Electrical Stimulation    PT Next Visit Plan instruct in well zone use - leg pres ( staggered stance and SL), knee extension, nustep ( interval training and appropriate seat height)    PT Home Exercise Plan 05/10/2021-Access Code: [email or web address]  URL:  [email or web address]    Consulted and Agree with Plan of Care Patient             Patient will benefit from skilled therapeutic intervention in order to improve the following deficits and impairments:  Abnormal gait, Decreased activity tolerance, Decreased balance, Decreased endurance, Decreased knowledge of use of DME, Decreased mobility, Decreased strength, Decreased range of motion, Hypomobility, Increased muscle spasms, Impaired flexibility, Decreased skin integrity, Difficulty walking, Improper body mechanics, Pain  Visit Diagnosis: Abnormality of gait and mobility  Difficulty in walking, not elsewhere classified  Status post right knee replacement     Problem List Patient Active Problem List   Diagnosis Date Noted   OA (osteoarthritis) of knee 03/26/2021   Primary osteoarthritis of right knee 03/26/2021   Leukocytosis 04/19/2020   Thyroid cyst 02/26/2018   Plantar fasciitis 02/19/2016   Family history of breast cancer in sister 03/10/2015   Breast cancer of lower-outer quadrant of right female breast ([facility]) 02/10/2015   Abdominal bloating 12/16/2013   Edema 11/09/2013   Urge incontinence 05/05/2013   Atrophic vaginitis 05/05/2013   Syncope 02/15/2013   Irritable bowel syndrome 08/20/2012   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   Generalized anxiety disorder 11/07/2011   Peripheral neuropathy 07/05/2011   Osteoporosis 07/05/2011   Tinnitus, bilateral 04/05/2011   HYPERCHOLESTEROLEMIA 04/05/2010   ANEMIA, IRON DEFICIENCY 04/03/2010   DEPRESSION, MAJOR, MODERATE 04/03/2010   Essential hypertension 04/03/2010   ALLERGIC RHINITIS 04/03/2010   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/03/2010   POSTMENOPAUSAL SYNDROME 04/03/2010   DEGENERATIVE JOINT DISEASE, CERVICAL SPINE 04/03/2010   FIBROMYALGIA 04/03/2010    Particia Lather, PT 08/21/2021, 12:22 PM  [place] [facility] MAIN [place] [address] [place],  [place], [address] Phone: [phone]   Fax:  [phone]  Name: TANICIA WOLAVER MRN: [id number] Date of Birth: 10-Oct-1950

## 2021-08-24 ENCOUNTER — Other Ambulatory Visit: Payer: Self-pay

## 2021-08-24 ENCOUNTER — Ambulatory Visit: Payer: Medicare PPO | Admitting: Physical Therapy

## 2021-08-24 DIAGNOSIS — M6281 Muscle weakness (generalized): Secondary | ICD-10-CM | POA: Diagnosis not present

## 2021-08-24 DIAGNOSIS — M25561 Pain in right knee: Secondary | ICD-10-CM | POA: Diagnosis not present

## 2021-08-24 DIAGNOSIS — Z96651 Presence of right artificial knee joint: Secondary | ICD-10-CM

## 2021-08-24 DIAGNOSIS — R269 Unspecified abnormalities of gait and mobility: Secondary | ICD-10-CM | POA: Diagnosis not present

## 2021-08-24 DIAGNOSIS — R2689 Other abnormalities of gait and mobility: Secondary | ICD-10-CM | POA: Diagnosis not present

## 2021-08-24 DIAGNOSIS — G8929 Other chronic pain: Secondary | ICD-10-CM | POA: Diagnosis not present

## 2021-08-24 DIAGNOSIS — R278 Other lack of coordination: Secondary | ICD-10-CM | POA: Diagnosis not present

## 2021-08-24 DIAGNOSIS — R2681 Unsteadiness on feet: Secondary | ICD-10-CM | POA: Diagnosis not present

## 2021-08-24 DIAGNOSIS — R262 Difficulty in walking, not elsewhere classified: Secondary | ICD-10-CM | POA: Diagnosis not present

## 2021-08-24 NOTE — Patient Instructions (Signed)
Access Code: [email or web address] URL: [email or web address] Date: 08/23/2021 Prepared by: Rivka Barbara Exercises  Full Leg Press - 1 x daily - 2 x weekly - 2 sets - 12 reps  Single Leg Press - 1 x daily - 2 x weekly - 2 sets - 10 reps  Nustep x 15 min level 3 or interval training between level 2 and level 4 - 1 x daily - 2 x weekly - 15 minutes hold  Single Leg Knee Extension with Weight Machine - 1 x daily - 2 x weekly - 2 sets - 10 reps Knee Extension with Weight Machine - 1 x daily - 2 x weekly - 2 sets - 10 reps - 5 sec hold  Seated Passive Knee Extension with Weight - 1 x daily - 7 x weekly - 1 sets - 5 minute hold

## 2021-08-24 NOTE — Therapy (Signed)
[place] MAIN [facility] SERVICES [address] [place], [place], [address] Phone: [phone]   Fax:  [phone]  Physical Therapy Treatment  Patient Details  Name: Shannon Hancock MRN: [id number] Date of Birth: [birth year] Referring Provider (PT): [name] (Sports medicine @ [facility])   Encounter Date: 08/24/2021   PT End of Session - 08/24/21 1125     Visit Number 41    Number of Visits 50    Date for PT Re-Evaluation 09/20/21    Authorization Type Auth through 12/2; progress note on 72/5, new cert 36/64-4/03/47, last progress note 1/17; Recert 4/25-9/56    Progress Note Due on Visit [place] During Treatment Gait belt    Activity Tolerance Patient tolerated treatment well    Behavior During Therapy WFL for tasks assessed/performed             Past Medical History:  Diagnosis Date   Allergy    Anxiety    Breast cancer ([place]) 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; ER/PR+, Her2-, 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%   Depression    Diabetes mellitus without complication (HCC)    Diverticulosis    Fibromyalgia    GERD (gastroesophageal reflux disease)    Heart murmur    "no murmur" documented in PCP note by [name], MD 02/09/21   High cholesterol    Hypertension    Joint pain     Past Surgical History:  Procedure Laterality Date   BREAST LUMPECTOMY Right 2016   BREAST LUMPECTOMY WITH RADIOACTIVE SEED AND SENTINEL LYMPH NODE BIOPSY Right 02/13/2015   Procedure: RIGHT BREAST LUMPECTOMY WITH RADIOACTIVE SEED AND SENTINEL LYMPH NODE MAPPING;  Surgeon: [name], MD;  Location: [place];  Service: General;  Laterality: Right;   FOOT SURGERY Left    TOTAL KNEE ARTHROPLASTY Right 03/26/2021   Procedure: TOTAL KNEE ARTHROPLASTY;  Surgeon: [name], MD;  Location: [facility];  Service: Orthopedics;  Laterality: Right;   TUBAL LIGATION      There were no vitals filed for this visit.   Subjective  Assessment - 08/24/21 1124     Subjective Pt reports no significant changes since previous session. Reports she is doing well and will be out of town in [place] visiting her DTR next week.    Pertinent History Shannon Hancock is a 71 y.o. female presenting to physical therapy s/p R TKA 03/26/21. Pt reports to therapy amb with RW. Pt resides in a two story home with six steps to enter from the front and a ramp in the back. She reports negotiating stairs with assistance from her husband. She reports being able to negotiate and live on the main story of her home. Pt reports she would like to increase her R knee mobility, strength, and decrease her pain in order to walk without AD and go bowling. PMH includes breast cancer, fibromyalgia, DM, HTN, anxiety, depression, and hearing loss. Pt denies any unexplained weight fluctuation, saddle paresthesia, unrelenting night pain, or falls in the last 6 months.    Limitations Lifting;Walking;Standing;House hold activities    How long can you sit comfortably? 5-10 min    How long can you stand comfortably? 5 min    How long can you walk comfortably? 5-8 minutes    Patient Stated Goals Returned and bowling and restore ROM    Pain Onset More than a month ago  TREATMENT: Access Code: [email or web address]  URL: [email or web address]  Date: 08/23/2021  Prepared by: Rivka Barbara  Completed following exercises in well zone gym:  Exercises  Full Leg Press - 1 x daily - 2 x weekly - 2 sets - 12 reps  Single Leg Press - 1 x daily - 2 x weekly - 2 sets - 10 reps  Nustep x 6 min level 3 or interval training between level 2 and level 4 - 1 x daily - 2 x weekly - 15 minutes hold  Single Leg Knee Extension with Weight Machine - 1 x daily - 2 x weekly - 2 sets - 10 reps Level     Patient supine: Manual therapy: PT performed grade II-III AP mobs femur on tibia in supine in multiple ranges of motion in addition to patellar glides (superior in  tke) with 20-30 sec holds approximately, multiple reps.  PT Performed passive hamstring stretch with overpressure 30 sec hold x2 reps; Overpressure with knee propped at heel in supine x multiple reps level 3-4 mobilizations throughout between holds.  Pt has some discomfort in the patellar tendon with overpressure into extension, addressed with Cross friction massage (CFM) to this area and pt noted improved pain in this area following. Pt educated regarding CFM to this area and was instructed in how to find the quad tendon.        Patient finished session with cryotherapy in supine x4 ( unbilled) min with LE extended to facilitate better knee extension;    Pt educated throughout session about proper posture and technique with exercises. Improved exercise technique, movement at target joints, use of target muscles after min to mod verbal, visual, tactile cues.                            PT Education - 08/24/21 1125     Education Details HEP for well zone gym    Person(s) Educated Patient    Methods Explanation;Demonstration;Handout    Comprehension Verbalized understanding;Returned demonstration;Verbal cues required;Tactile cues required              PT Short Term Goals - 05/31/21 1112       PT SHORT TERM GOAL #1   Title Pt will demonstrate independence with HEP to improve R LE function for increased ability to participate with ADLs    Baseline 11/3: pt asking for review of HEP, confident and independent with HEP, not completing on days when she has higher levele of pain    Time 4    Period Weeks    Status Achieved    Target Date 04/30/21               PT Long Term Goals - 08/21/21 1214       PT LONG TERM GOAL #1   Title Patient will increase FOTO score to 53 to demonstrate predicted increase in functional mobility to complete ADLs    Baseline 34.5 1/17 55 on 2/21    Time 8    Period Weeks    Status Revised    Target Date 09/20/21      PT  LONG TERM GOAL #2   Title Pt will perform 5XSTS in 12 seconds in order to demonstrate improved LE strength and decrease the likelihood of falling.    Baseline 04/02/21: 25; 11/3: 11 seconds    Time 12    Period Weeks    Status Achieved      PT  LONG TERM GOAL #3   Title Pt will actively demonstrate 0 deg R knee extension and 120 deg R knee flexion in order to safely negotiate steps and normalize gait without AD.    Baseline 07/17/21: 6 extension, 120 flexion (PROM) 3 degrees extension, 120 fexion    Time 8    Period Weeks    Status On-going    Target Date 09/20/21      PT LONG TERM GOAL #4   Title Pt will demonstrate SLS of RLE of at least 10sec to demonstrate decreased fall risk and PLOF of contralateral LE    Baseline 04/02/21 LLE 10sec RLE unable; 11/3: LLE 15 sec, RLE 11 sec    Time 8    Period Weeks    Status Achieved      PT LONG TERM GOAL #5   Title Pt will improve 6MWT to greater than 1300 feet in order to indicate improved community ambulation capacity.    Baseline 855 feet on 05/29/21 1180 on 07/17/21 1365 on 2/21    Time 8    Period Weeks    Status New    Target Date 09/20/21                   Plan - 08/24/21 1125     Clinical Impression Statement Pt presented with excellent motivation for completion of PT interventions this date. Pt was instructed and walked through several activities in the well zone comunity gym in order for continuing progress with transition to HEP. Pt continues to have tightness in knee with increased dificulty with TKE. Many of her HEP well zone activities targeted this. Pt wil continue to benefit from skilled PT in order to ipmrove her knee strength, ROM, mobility and QOL.    Personal Factors and Comorbidities Age;Comorbidity 3+    Comorbidities HTN, anxiety, depression    Examination-Activity Limitations Bed Mobility;Carry;Lift;Locomotion Level;Squat;Sit    Examination-Participation Restrictions Church;Cleaning;Community  Activity;Laundry;Driving    Stability/Clinical Decision Making Evolving/Moderate complexity    Rehab Potential Good    PT Frequency 2x / week    PT Duration 8 weeks    PT Treatment/Interventions ADLs/Self Care Home Management;Gait training;Stair training;Functional mobility training;Therapeutic activities;Therapeutic exercise;Balance training;Patient/family education;Passive range of motion;Cryotherapy;Aquatic Therapy;Neuromuscular re-education;Manual techniques;Dry needling;Energy conservation;Scar mobilization;Electrical Stimulation    PT Next Visit Plan instruct in well zone use - leg pres ( staggered stance and SL), knee extension, nustep ( interval training and appropriate seat height)    PT Home Exercise Plan 05/10/2021-Access Code: [email or web address]  URL: [email or web address]    Consulted and Agree with Plan of Care Patient             Patient will benefit from skilled therapeutic intervention in order to improve the following deficits and impairments:  Abnormal gait, Decreased activity tolerance, Decreased balance, Decreased endurance, Decreased knowledge of use of DME, Decreased mobility, Decreased strength, Decreased range of motion, Hypomobility, Increased muscle spasms, Impaired flexibility, Decreased skin integrity, Difficulty walking, Improper body mechanics, Pain  Visit Diagnosis: Abnormality of gait and mobility  Status post right knee replacement     Problem List Patient Active Problem List   Diagnosis Date Noted   OA (osteoarthritis) of knee 03/26/2021   Primary osteoarthritis of right knee 03/26/2021   Leukocytosis 04/19/2020   Thyroid cyst 02/26/2018   Plantar fasciitis 02/19/2016   Family history of breast cancer in sister 03/10/2015   Breast cancer of lower-outer quadrant of right female breast ([place]) 02/10/2015   Abdominal bloating 12/16/2013   Edema  11/09/2013   Urge incontinence 05/05/2013   Atrophic vaginitis 05/05/2013   Syncope 02/15/2013    Irritable bowel syndrome 08/20/2012   Hearing loss 11/07/2011   Generalized anxiety disorder 11/07/2011   Peripheral neuropathy 07/05/2011   Osteoporosis 07/05/2011   Tinnitus, bilateral 04/05/2011   HYPERCHOLESTEROLEMIA 04/05/2010   ANEMIA, IRON DEFICIENCY 04/03/2010   DEPRESSION, MAJOR, MODERATE 04/03/2010   Essential hypertension 04/03/2010   ALLERGIC RHINITIS 04/03/2010   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/03/2010   POSTMENOPAUSAL SYNDROME 04/03/2010   DEGENERATIVE JOINT DISEASE, CERVICAL SPINE 04/03/2010   FIBROMYALGIA 04/03/2010    Particia Lather, PT 08/24/2021, 11:29 AM  [place] MAIN [facility] SERVICES [address] [place], [place], [address] Phone: [phone]   Fax:  [phone]  Name: Shannon Hancock MRN: [id number] Date of Birth: [birth year]

## 2021-08-26 ENCOUNTER — Encounter: Payer: Self-pay | Admitting: Emergency Medicine

## 2021-08-26 ENCOUNTER — Emergency Department: Payer: Medicare PPO

## 2021-08-26 ENCOUNTER — Other Ambulatory Visit: Payer: Self-pay

## 2021-08-26 ENCOUNTER — Emergency Department
Admission: EM | Admit: 2021-08-26 | Discharge: 2021-08-26 | Disposition: A | Discharge status: Home / Self Care | Payer: Medicare PPO | Attending: Emergency Medicine | Admitting: Emergency Medicine

## 2021-08-26 DIAGNOSIS — N3000 Acute cystitis without hematuria: Secondary | ICD-10-CM | POA: Diagnosis not present

## 2021-08-26 DIAGNOSIS — R3915 Urgency of urination: Secondary | ICD-10-CM | POA: Diagnosis present

## 2021-08-26 DIAGNOSIS — M47817 Spondylosis without myelopathy or radiculopathy, lumbosacral region: Secondary | ICD-10-CM | POA: Diagnosis not present

## 2021-08-26 DIAGNOSIS — R109 Unspecified abdominal pain: Secondary | ICD-10-CM | POA: Diagnosis not present

## 2021-08-26 DIAGNOSIS — R8289 Other abnormal findings on cytological and histological examination of urine: Secondary | ICD-10-CM | POA: Diagnosis not present

## 2021-08-26 DIAGNOSIS — M47816 Spondylosis without myelopathy or radiculopathy, lumbar region: Secondary | ICD-10-CM | POA: Diagnosis not present

## 2021-08-26 DIAGNOSIS — Z87440 Personal history of urinary (tract) infections: Secondary | ICD-10-CM | POA: Diagnosis not present

## 2021-08-26 LAB — URINALYSIS, COMPLETE (UACMP) WITH MICROSCOPIC
Bacteria, UA: NONE SEEN
Bilirubin Urine: NEGATIVE
Glucose, UA: NEGATIVE mg/dL
Ketones, ur: 5 mg/dL — AB
Leukocytes,Ua: NEGATIVE
Nitrite: NEGATIVE
Protein, ur: NEGATIVE mg/dL
Specific Gravity, Urine: 1.019 (ref 1.005–1.030)
pH: 6 (ref 5.0–8.0)

## 2021-08-26 MED ORDER — SULFAMETHOXAZOLE-TRIMETHOPRIM 400-80 MG PO TABS: 1.0000 | ORAL_TABLET | Freq: Two times a day (BID) | ORAL | 0 refills | Status: DC

## 2021-08-26 NOTE — ED Notes (Signed)
74 yof diagnosed with a UTI on 2/21. The pt advised she then started her antibiotic and has seen little improvement. The pt states she has frequent urination urges and some Davine Sweney itching.

## 2021-08-26 NOTE — ED Provider Notes (Signed)
[facility] Provider Note    Event Date/Time   First MD Initiated Contact with Patient 08/26/21 [phone]     (approximate)   History   Urinary Frequency   HPI  Shannon Hancock is a 71 y.o. female with a history of urge incontinence and atrophic vaginitis, presents to the ER today with complaint of urinary urgency, frequency, retention and low back pain.  She was initially seen 2/21 for the same.  Urinalysis was concerning for infection at that time so she was started on Cefdinir 300 mg x 7 days.  Urine culture grew Klebsiella.  She is concerned that something else is going on since her symptoms seem to be persistent, slightly worse despite taking the antibiotics.  She denies fever, chills, nausea, vomiting or lower abdominal pain.  She denies dysuria, blood in her urine or any vaginal symptoms at this time.  She has not tried anything additional over-the-counter other than what she is prescribed.  She reports she is leaving to go to [place] tomorrow morning with her daughter.    Physical Exam   Triage Vital Signs: ED Triage Vitals  Enc Vitals Group     BP 08/26/21 0756 (!) 115/102     Pulse Rate 08/26/21 0756 86     Resp 08/26/21 0756 18     Temp 08/26/21 0756 98.3 F (36.8 C)     Temp Source 08/26/21 0756 Oral     SpO2 08/26/21 0756 95 %     Weight 08/26/21 0754 178 lb 9.2 oz (81 kg)     Height 08/26/21 0754 5\' 3"  (1.6 m)     Head Circumference --      Peak Flow --      Pain Score 08/26/21 0754 0     Pain Loc --      Pain Edu? --      Excl. in [place]? --     Most recent vital signs: Vitals:   08/26/21 0756  BP: (!) 115/102  Pulse: 86  Resp: 18  Temp: 98.3 F (36.8 C)  SpO2: 95%     General: Awake, no distress. CV:  RRR, no murmur Resp:  CTA bilaterally. Abd:  Normal bowel sounds.  No distention.  Pain with palpation over the bladder.  No CVA tenderness noted.   ED Results / Procedures / Treatments   Labs (all labs ordered are listed, but  only abnormal results are displayed) Labs Reviewed  URINALYSIS, COMPLETE (UACMP) WITH MICROSCOPIC - Abnormal; Notable for the following components:      Result Value   Color, Urine YELLOW (*)    APPearance CLEAR (*)    Hgb urine dipstick MODERATE (*)    Ketones, ur 5 (*)    All other components within normal limits    RADIOLOGY Imaging Orders         CT Renal Stone Study    IMPRESSION: No evidence of urolithiasis, hydronephrosis, or other acute findings.     MEDICATIONS ORDERED IN ED: Medications - No data to display   IMPRESSION / MDM / [place] / ED COURSE  I reviewed the triage vital signs and the nursing notes.  UTI, Worsening:  Differential diagnosis includes, but is not limited to complicated UTI, acute pyelonephritis, interstitial cystitis, kidney stone  Urinalysis shows moderate amount of blood and mucus which is increased from her previous urinalysis CT renal stone study does not show any evidence of kidney stone, hydronephritis or pyelonephritis per my read,  confirmed by radiology We will have her stop Cefdinir as her symptoms are persistent and not improving Rx for Septra 1 tab p.o. twice daily x7 days Push fluids Advised her to follow-up with her PCP as an outpatient if symptoms persist  FINAL CLINICAL IMPRESSION(S) / ED DIAGNOSES   Final diagnoses:  Acute cystitis without hematuria     Rx / DC Orders   ED Discharge Orders          Ordered    sulfamethoxazole-trimethoprim (BACTRIM) 400-80 MG tablet  2 times daily        08/26/21 1058             Note:  This document was prepared using Dragon voice recognition software and may include unintentional dictation errors.    Jearld Fenton, NP 08/26/21 1059    Duffy Bruce, MD 08/26/21 1104

## 2021-08-26 NOTE — Discharge Instructions (Signed)
You were seen today for persistent UTI symptoms.  Your urine does show continued infection.  Your CT scan did not show any evidence of kidney stone or kidney infection.  Please stop your cefdinir.  I am starting you on a different antibiotic to take twice daily for the next 7 days.  Please be sure to push fluids.

## 2021-08-26 NOTE — ED Triage Notes (Signed)
Pt reports was seen at [facility] 5 days ago and dx'd with a UTI. Pt states she is taking her meds but is supposed to travel tomorrow and wants to be rechecked to make sure it is going away. Pt states she still has abx left to take but also still has urgency and wants to make sure she is good to travel.

## 2021-08-28 ENCOUNTER — Encounter: Payer: Medicare PPO | Admitting: Physical Therapy

## 2021-08-30 ENCOUNTER — Other Ambulatory Visit: Payer: Self-pay | Admitting: Otolaryngology

## 2021-08-30 ENCOUNTER — Encounter: Payer: Medicare PPO | Admitting: Physical Therapy

## 2021-08-30 DIAGNOSIS — H70219 Acute petrositis, unspecified ear: Secondary | ICD-10-CM

## 2021-09-03 ENCOUNTER — Encounter: Payer: Self-pay | Admitting: Physical Therapy

## 2021-09-03 ENCOUNTER — Other Ambulatory Visit: Payer: Self-pay

## 2021-09-03 ENCOUNTER — Ambulatory Visit: Payer: Medicare PPO | Attending: Sports Medicine | Admitting: Physical Therapy

## 2021-09-03 DIAGNOSIS — Z96651 Presence of right artificial knee joint: Secondary | ICD-10-CM | POA: Insufficient documentation

## 2021-09-03 DIAGNOSIS — R262 Difficulty in walking, not elsewhere classified: Secondary | ICD-10-CM | POA: Diagnosis not present

## 2021-09-03 DIAGNOSIS — R269 Unspecified abnormalities of gait and mobility: Secondary | ICD-10-CM | POA: Diagnosis not present

## 2021-09-03 DIAGNOSIS — R2681 Unsteadiness on feet: Secondary | ICD-10-CM | POA: Diagnosis not present

## 2021-09-03 NOTE — Therapy (Signed)
Shannon Hancock [facility] SERVICES [address] [place], [place], [address] Phone: [phone]   Fax:  [phone]  Physical Therapy Treatment  Patient Details  Name: Shannon Hancock MRN: [id number] Date of Birth: Aug 26, 1950 Referring Provider (PT): [name] ([facility] medicine @ [facility])   Encounter Date: 09/03/2021   PT End of Session - 09/03/21 1147     Visit Number 42    Number of Visits 50    Date for PT Re-Evaluation 09/20/21    Authorization Type Auth through 12/2; progress note on 41/3, new cert 24/40-1/02/72, last progress note 1/17; Recert 5/36-6/44    Progress Note Due on Visit 35    PT Start Time 1142    PT Stop Time 1220    PT Time Calculation (min) 38 min    Equipment Utilized During Treatment Gait belt    Activity Tolerance Patient tolerated treatment well    Behavior During Therapy WFL for tasks assessed/performed             Past Medical History:  Diagnosis Date   Allergy    Anxiety    Breast cancer ([place]) 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; ER/PR+, Her2-, 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;  Service: General;  Laterality: Right;   FOOT SURGERY Left    TOTAL KNEE ARTHROPLASTY Right 03/26/2021   Procedure: TOTAL KNEE ARTHROPLASTY;  Surgeon: [name], MD;  Location: [facility];  Service: Orthopedics;  Laterality: Right;   TUBAL  LIGATION      There were no vitals filed for this visit.   Subjective Assessment - 09/03/21 1146     Subjective Pt reports her trip to visit her daighter went well. Had some swelling in her knee following increase in activity but could also be related to cessation of aleve on her trip. Pt report good mobility overall on her trip and continuation of her exercises on the trip as well.    Pertinent History Shannon Hancock is a 71 y.o. female presenting to physical therapy s/p R TKA 03/26/21. Pt reports to therapy amb with RW. Pt resides in a two story home with six steps to enter from the front and a ramp in the back. She reports negotiating stairs with assistance from her husband. She reports being able to negotiate and live on the [place] story of her home. Pt reports she would like to increase her R knee mobility, strength, and decrease her pain in order to walk without AD and go bowling. PMH includes breast cancer, fibromyalgia, DM, HTN, anxiety, depression, and hearing loss. Pt denies any unexplained weight fluctuation, saddle paresthesia, unrelenting night pain, or falls in the last 6 months.    Limitations Lifting;Walking;Standing;House hold activities    How long can you sit comfortably? 5-10 min    How long can you stand comfortably?  5 min    How long can you walk comfortably? 5-8 minutes    Patient Stated Goals Returned and bowling and restore ROM    Currently in Pain? No/denies    Pain Onset More than a month ago            TREATMENT: Warm up on Nustep BLE level 3 x4 min (Unbilled)   Leg press: BLE plate 70# , 2H85 reps with staggered position (RLE Lower on plate compared to left to increase TKE and R LE load)- moderate difficulty reported;  RLE only 25# 2x12 with moderate difficulty reported LAQ 2 x 10 x 5 sec hold for quad and knee extension strength - rated hard by pt  Standing at steps  -forward step up to 6 inch step unsupported x10 reps, leading with RLE to challenge quad  control; - side step up on 6 inch step unsupported leading with RW x10 reps with CGA for safety and cues to increase step length for better foot clearance;    Patient supine: Manual therapy: PT performed grade II-III AP mobs tibiofemoral in supine in multiple ranges of motion in addition to patellar glides (superior in tke) with 20-30 sec holds approximately, multiple reps.  PT Performed passive hamstring stretch with overpressure 30 sec hold x2 reps; Overpressure with knee propped at heel in supine x multiple reps level 3-4 mobilizations throughout between holds.  Cross friction massage (CFM) to this area and pt noted improved pain in this area following. Pt educated regarding CFM to this area and was instructed in how to find the quad tendon.   SLR with cues for maintaining max knee extension. 2 x 10  -following manual therapy   Patient finished session with cryotherapy in supine x4 ( unbilled) min with LE extended to facilitate better knee extension;    Pt educated throughout session about proper posture and technique with exercises. Improved exercise technique, movement at target joints, use of target muscles after min to mod verbal, visual, tactile cues.                              PT Education - 09/03/21 1147     Education Details Exercise form and technique    Person(s) Educated Patient    Methods Explanation    Comprehension Verbalized understanding              PT Short Term Goals - 05/31/21 1112       PT SHORT TERM GOAL #1   Title Pt will demonstrate independence with HEP to improve R LE function for increased ability to participate with ADLs    Baseline 11/3: pt asking for review of HEP, confident and independent with HEP, not completing on days when she has higher levele of pain    Time 4    Period Weeks    Status Achieved    Target Date 04/30/21               PT Long Term Goals - 08/21/21 1214       PT LONG TERM GOAL #1    Title Patient will increase FOTO score to 53 to demonstrate predicted increase in functional mobility to complete ADLs    Baseline 34.5 1/17 55 on 2/21    Time 8    Period Weeks    Status Revised    Target Date 09/20/21      PT LONG TERM GOAL #2  Title Pt will perform 5XSTS in 12 seconds in order to demonstrate improved LE strength and decrease the likelihood of falling.    Baseline 04/02/21: 25; 11/3: 11 seconds    Time 12    Period Weeks    Status Achieved      PT LONG TERM GOAL #3   Title Pt will actively demonstrate 0 deg R knee extension and 120 deg R knee flexion in order to safely negotiate steps and normalize gait without AD.    Baseline 07/17/21: 6 extension, 120 flexion (PROM) 3 degrees extension, 120 fexion    Time 8    Period Weeks    Status On-going    Target Date 09/20/21      PT LONG TERM GOAL #4   Title Pt will demonstrate SLS of RLE of at least 10sec to demonstrate decreased fall risk and PLOF of contralateral LE    Baseline 04/02/21 LLE 10sec RLE unable; 11/3: LLE 15 sec, RLE 11 sec    Time 8    Period Weeks    Status Achieved      PT LONG TERM GOAL #5   Title Pt will improve 6MWT to greater than 1300 feet in order to indicate improved community ambulation capacity.    Baseline 855 feet on 05/29/21 1180 on 07/17/21 1365 on 2/21    Time 8    Period Weeks    Status New    Target Date 09/20/21                   Plan - 09/03/21 1148     Clinical Impression Statement pt presents with excellent motivation for copmleteion of PT interventions. intervnetions this session focussed on R knee extension strenth and ROM. Pt made good progress with ROM and measured at 2 degrees from full extenison following intervention and measured concurently wiht pt performing quad set in supine with heel prop. Pt will continue to benefit from skilled PT in order to ipmrove R knee extension strength, ROM and improve her mobility tolerance.    Personal Factors and Comorbidities  Age;Comorbidity 3+    Comorbidities HTN, anxiety, depression    Examination-Activity Limitations Bed Mobility;Carry;Lift;Locomotion Level;Squat;Sit    Examination-Participation Restrictions Church;Cleaning;Community Activity;Laundry;Driving    Stability/Clinical Decision Making Evolving/Moderate complexity    Rehab Potential Good    PT Frequency 2x / week    PT Duration 8 weeks    PT Treatment/Interventions ADLs/Self Care Home Management;Gait training;Stair training;Functional mobility training;Therapeutic activities;Therapeutic exercise;Balance training;Patient/family education;Passive range of motion;Cryotherapy;Aquatic Therapy;Neuromuscular re-education;Manual techniques;Dry needling;Energy conservation;Scar mobilization;Electrical Stimulation    PT Next Visit Plan instruct in well zone use - leg pres ( staggered stance and SL), knee extension, nustep ( interval training and appropriate seat height)    PT Home Exercise Plan 05/10/2021-Access Code: [email or web address]  URL: [email or web address]    Consulted and Agree with Plan of Care Patient             Patient will benefit from skilled therapeutic intervention in order to improve the following deficits and impairments:  Abnormal gait, Decreased activity tolerance, Decreased balance, Decreased endurance, Decreased knowledge of use of DME, Decreased mobility, Decreased strength, Decreased range of motion, Hypomobility, Increased muscle spasms, Impaired flexibility, Decreased skin integrity, Difficulty walking, Improper body mechanics, Pain  Visit Diagnosis: Difficulty in walking, not elsewhere classified  Unsteadiness on feet  Status post right knee replacement     Problem List Patient Active Problem List   Diagnosis Date Noted   OA (osteoarthritis) of knee 03/26/2021  Primary osteoarthritis of right knee 03/26/2021   Leukocytosis 04/19/2020   Thyroid cyst 02/26/2018   Plantar fasciitis 02/19/2016   Family history of  breast cancer in sister 03/10/2015   Breast cancer of lower-outer quadrant of right female breast ([place]) 02/10/2015   Abdominal bloating 12/16/2013   Edema 11/09/2013   Urge incontinence 05/05/2013   Atrophic vaginitis 05/05/2013   Syncope 02/15/2013   Irritable bowel syndrome 08/20/2012   Hearing loss 11/07/2011   Generalized anxiety disorder 11/07/2011   Peripheral neuropathy 07/05/2011   Osteoporosis 07/05/2011   Tinnitus, bilateral 04/05/2011   HYPERCHOLESTEROLEMIA 04/05/2010   ANEMIA, IRON DEFICIENCY 04/03/2010   DEPRESSION, MAJOR, MODERATE 04/03/2010   Essential hypertension 04/03/2010   ALLERGIC RHINITIS 04/03/2010   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/03/2010   POSTMENOPAUSAL SYNDROME 04/03/2010   DEGENERATIVE JOINT DISEASE, CERVICAL SPINE 04/03/2010   FIBROMYALGIA 04/03/2010    Shannon Hancock, PT 09/03/2021, 1:03 PM  [facility] SERVICES [address] [place], [place], [address] Phone: [phone]   Fax:  [phone]  Name: Shannon Hancock MRN: [id number] Date of Birth: Sep 24, 1950

## 2021-09-04 DIAGNOSIS — N3281 Overactive bladder: Secondary | ICD-10-CM | POA: Diagnosis not present

## 2021-09-04 DIAGNOSIS — N390 Urinary tract infection, site not specified: Secondary | ICD-10-CM | POA: Diagnosis not present

## 2021-09-05 ENCOUNTER — Encounter: Payer: Medicare PPO | Admitting: Physical Therapy

## 2021-09-11 ENCOUNTER — Ambulatory Visit: Payer: Medicare PPO | Admitting: Physical Therapy

## 2021-09-11 ENCOUNTER — Other Ambulatory Visit: Payer: Self-pay

## 2021-09-11 DIAGNOSIS — R2681 Unsteadiness on feet: Secondary | ICD-10-CM

## 2021-09-11 DIAGNOSIS — R262 Difficulty in walking, not elsewhere classified: Secondary | ICD-10-CM

## 2021-09-11 DIAGNOSIS — Z96651 Presence of right artificial knee joint: Secondary | ICD-10-CM

## 2021-09-11 DIAGNOSIS — R269 Unspecified abnormalities of gait and mobility: Secondary | ICD-10-CM | POA: Diagnosis not present

## 2021-09-11 NOTE — Therapy (Signed)
[place] [facility] ?[place], [place], [address] ?Phone: [phone]   Fax:  [phone] ? ?Physical Therapy Treatment ? ?Patient Details  ?Name: Shannon Hancock ?MRN: [id number] ?Date of Birth: [birth year] ?Referring Provider (PT): [name] ([facility] medicine @ [facility] [place]) ? ? ?Encounter Date: 09/11/2021 ? ? PT End of Session - 09/11/21 0939   ? ? Visit Number 10   ? Number of Visits 50   ? Date for PT Re-Evaluation 09/20/21   ? Authorization Type Auth through 12/2; progress note on 69/6, new cert 29/52-8/41/32, last progress note 1/17; Recert 4/40-1/02   ? Progress Note Due on Visit 50   ? PT Start Time 7253   ? PT Stop Time 1015   ? PT Time Calculation (min) 40 min   ? Equipment Utilized During Treatment Gait belt   ? Activity Tolerance Patient tolerated treatment well   ? Behavior During Therapy [facility] for tasks assessed/performed   ? ?  ?  ? ?  ? ? ?Past Medical History:  ?Diagnosis Date  ? Allergy   ? Anxiety   ? Breast cancer ([place]) 12/2014  ? 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 Depression   ? Diabetes mellitus without complication ([place])   ? Diverticulosis   ? Fibromyalgia   ? GERD (gastroesophageal reflux disease)   ? Heart murmur   ? "no murmur" documented in PCP note by [name], MD 02/09/21  ? High cholesterol   ? Hypertension   ? Joint pain   ? ? ?Past Surgical History:  ?Procedure Laterality Date  ? BREAST LUMPECTOMY Right 2016  ? BREAST LUMPECTOMY WITH RADIOACTIVE SEED AND SENTINEL LYMPH NODE BIOPSY Right 02/13/2015  ? Procedure: RIGHT BREAST LUMPECTOMY WITH RADIOACTIVE SEED AND SENTINEL LYMPH NODE MAPPING;  Surgeon: [name], MD;  Location: [facility];  Service: General;  Laterality: Right;  ? FOOT SURGERY Left   ? TOTAL KNEE ARTHROPLASTY Right 03/26/2021  ? Procedure: TOTAL KNEE ARTHROPLASTY;  Surgeon: [name], MD;  [facility];  Service: Orthopedics;  Laterality: Right;  ? TUBAL  LIGATION    ? ? ?There were no vitals filed for this visit. ? ? Subjective Assessment - 09/11/21 0937   ? ? Subjective Pt reports she has been busy helping her DTR with planning a convention in [facility] and has not had as much time for her exercises but id eager to get back to it.   ? Pertinent History Shannon Hancock is a 71 y.o. female presenting to physical therapy s/p R TKA 03/26/21. Pt reports to therapy amb with RW. Pt resides in a two story home with six steps to enter from the front and a ramp in the back. She reports negotiating stairs with assistance from her husband. She reports being able to negotiate and live on the main story of her home. Pt reports she would like to increase her R knee mobility, strength, and decrease her pain in order to walk without AD and go bowling. PMH includes breast cancer, fibromyalgia, DM, HTN, anxiety, depression, and hearing loss. Pt denies any unexplained weight fluctuation, saddle paresthesia, unrelenting night pain, or falls in the last 6 months.   ? Limitations Lifting;Walking;Standing;House hold activities   ? How long can you sit comfortably? 5-10 min   ? How long can you stand comfortably? 5 min   ? How long can you walk comfortably? 5-8 minutes   ? Patient  Stated Goals Returned and bowling and restore ROM   ? Currently in Pain? No/denies   ? Pain Onset More than a month ago   ? ?  ?  ? ?  ? ? ?TREATMENT: ?Warm up on Nustep BLE level 3 x5 min (Unbilled)   ?Leg press: ?BLE plate 70# , 3K44 reps with staggered position (RLE Lower on plate compared to left to increase TKE and R LE load) ?RLE only 25# 2x12 with moderate difficulty reported ? ? ?  ?Patient supine: ?Manual therapy: ?PT performed grade II-III AP mobs tibiofemoral in supine in multiple ranges of motion in addition to patellar glides (superior in tke) with 20-30 sec holds approximately, multiple reps.  ?PT Performed passive hamstring stretch with overpressure 30 sec hold x2 reps; ?Overpressure with knee propped at  heel in supine x multiple reps level 3-4 mobilizations throughout between holds.  ?Cross friction massage (CFM) and superior patellar glides with end range holds to patellar tendon in area of pain/ tightness with end range extension  ? ?SLR with cues for maintaining max knee extension. 2 x 10  ?-following manual therapy ?  ?Patient finished session with cryotherapy in supine x4 ( unbilled) min with LE extended to facilitate better knee extension;  ?  ?Pt educated throughout session about proper posture and technique with exercises. Improved exercise technique, movement at target joints, use of target muscles after min to mod verbal, visual, tactile cues. ?  ? ? ? ? ? ? ? ? ? ? ? ? ? ? ? ? ? ? ? ? ? ? ? ? ? ? ? ? PT Education - 09/11/21 0938   ? ? Education Details education regarding knee ROM for funcitonal activities.   ? Person(s) Educated Patient   ? Methods Explanation   ? Comprehension Verbalized understanding   ? ?  ?  ? ?  ? ? ? PT Short Term Goals - 05/31/21 1112   ? ?  ? PT SHORT TERM GOAL #1  ? Title Pt will demonstrate independence with HEP to improve R LE function for increased ability to participate with ADLs   ? Baseline 11/3: pt asking for review of HEP, confident and independent with HEP, not completing on days when she has higher levele of pain   ? Time 4   ? Period Weeks   ? Status Achieved   ? Target Date 04/30/21   ? ?  ?  ? ?  ? ? ? ? PT Long Term Goals - 08/21/21 1214   ? ?  ? PT LONG TERM GOAL #1  ? Title Patient will increase FOTO score to 53 to demonstrate predicted increase in functional mobility to complete ADLs   ? Baseline 34.5 1/17 55 on 2/21   ? Time 8   ? Period Weeks   ? Status Revised   ? Target Date 09/20/21   ?  ? PT LONG TERM GOAL #2  ? Title Pt will perform 5XSTS in 12 seconds in order to demonstrate improved LE strength and decrease the likelihood of falling.   ? Baseline 04/02/21: 25; 11/3: 11 seconds   ? Time 12   ? Period Weeks   ? Status Achieved   ?  ? PT LONG TERM GOAL #3   ? Title Pt will actively demonstrate 0 deg R knee extension and 120 deg R knee flexion in order to safely negotiate steps and normalize gait without AD.   ? Baseline 07/17/21: 6 extension, 120 flexion (  PROM) 3 degrees extension, 120 fexion   ? Time 8   ? Period Weeks   ? Status On-going   ? Target Date 09/20/21   ?  ? PT LONG TERM GOAL #4  ? Title Pt will demonstrate SLS of RLE of at least 10sec to demonstrate decreased fall risk and PLOF of contralateral LE   ? Baseline 04/02/21 LLE 10sec RLE unable; 11/3: LLE 15 sec, RLE 11 sec   ? Time 8   ? Period Weeks   ? Status Achieved   ?  ? PT LONG TERM GOAL #5  ? Title Pt will improve 6MWT to greater than 1300 feet in order to indicate improved community ambulation capacity.   ? Baseline 855 feet on 05/29/21 1180 on 07/17/21 1365 on 2/21   ? Time 8   ? Period Weeks   ? Status New   ? Target Date 09/20/21   ? ?  ?  ? ?  ? ? ? ? ? ? ? ? Plan - 09/11/21 0939   ? ? Clinical Impression Statement Pt presents with excellent motivation for copmleteion of PT interventions. Intervnetions this session focussed on R knee extension strenth and ROM. Continued with heavy manual therapy to improve end range knee extensio nas well as resistance exercise to improve terminal knee extension. Pt will continue to benefit from skilled PT in order to ipmrove R knee extension strength, ROM and improve her mobility tolerance.   ? Personal Factors and Comorbidities Age;Comorbidity 3+   ? Comorbidities HTN, anxiety, depression   ? Examination-Activity Limitations Bed Mobility;Carry;Lift;Locomotion Level;Squat;Sit   ? Examination-Participation Restrictions Church;Cleaning;Community Activity;Laundry;Driving   ? Stability/Clinical Decision Making Evolving/Moderate complexity   ? Rehab Potential Good   ? PT Frequency 2x / week   ? PT Duration 8 weeks   ? PT Treatment/Interventions ADLs/Self Care Home Management;Gait training;Stair training;Functional mobility training;Therapeutic activities;Therapeutic  exercise;Balance training;Patient/family education;Passive range of motion;Cryotherapy;Aquatic Therapy;Neuromuscular re-education;Manual techniques;Dry needling;Energy conservation;Scar mobilization;El

## 2021-09-13 ENCOUNTER — Encounter: Payer: Medicare PPO | Admitting: Physical Therapy

## 2021-09-18 ENCOUNTER — Ambulatory Visit: Payer: Medicare PPO | Admitting: Physical Therapy

## 2021-09-18 ENCOUNTER — Other Ambulatory Visit: Payer: Self-pay

## 2021-09-18 ENCOUNTER — Encounter: Payer: Self-pay | Admitting: Physical Therapy

## 2021-09-18 DIAGNOSIS — R262 Difficulty in walking, not elsewhere classified: Secondary | ICD-10-CM

## 2021-09-18 DIAGNOSIS — R269 Unspecified abnormalities of gait and mobility: Secondary | ICD-10-CM | POA: Diagnosis not present

## 2021-09-18 DIAGNOSIS — R2681 Unsteadiness on feet: Secondary | ICD-10-CM | POA: Diagnosis not present

## 2021-09-18 DIAGNOSIS — Z96651 Presence of right artificial knee joint: Secondary | ICD-10-CM | POA: Diagnosis not present

## 2021-09-18 NOTE — Therapy (Signed)
[facility] ?[place], [place], [address] ?Phone: [phone]   Fax:  [phone] ? ?Physical Therapy Treatment/ Discharge Therapy  ? ?Patient Details  ?Name: Shannon Hancock ?MRN: [id number] ?Date of Birth: [birth year] ?Referring Provider (PT): [name] (Sports medicine @ [facility] [place]) ? ? ?Encounter Date: 09/18/2021 ? ? PT End of Session - 09/18/21 0939   ? ? Visit Number 44   ? Number of Visits 50   ? Date for PT Re-Evaluation 09/20/21   ? Authorization Type Auth through 12/2; progress note on 76/7, new cert 34/19-3/79/02, last progress note 1/17; Recert 4/09-7/35   ? Progress Note Due on Visit 50   ? PT Start Time 0930   ? PT Stop Time 1001   ? PT Time Calculation (min) 31 min   ? Equipment Utilized During Treatment Gait belt   ? Activity Tolerance Patient tolerated treatment well   ? Behavior During Therapy [facility] for tasks assessed/performed   ? ?  ?  ? ?  ? ? ?Past Medical History:  ?Diagnosis Date  ? Allergy   ? Anxiety   ? Breast cancer ([place]) 12/2014  ? 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 Depression   ? Diabetes mellitus without complication ([place])   ? Diverticulosis   ? Fibromyalgia   ? GERD (gastroesophageal reflux disease)   ? Heart murmur   ? "no murmur" documented in PCP note by [name], MD 02/09/21  ? High cholesterol   ? Hypertension   ? Joint pain   ? ? ?Past Surgical History:  ?Procedure Laterality Date  ? BREAST LUMPECTOMY Right 2016  ? BREAST LUMPECTOMY WITH RADIOACTIVE SEED AND SENTINEL LYMPH NODE BIOPSY Right 02/13/2015  ? Procedure: RIGHT BREAST LUMPECTOMY WITH RADIOACTIVE SEED AND SENTINEL LYMPH NODE MAPPING;  Surgeon: [name], MD;  Location: [facility];  Service: General;  Laterality: Right;  ? FOOT SURGERY Left   ? TOTAL KNEE ARTHROPLASTY Right 03/26/2021  ? Procedure: TOTAL KNEE ARTHROPLASTY;  Surgeon: [name], MD;  Location: [facility];  Service: Orthopedics;   Laterality: Right;  ? TUBAL LIGATION    ? ? ?There were no vitals filed for this visit. ? ? Subjective Assessment - 09/18/21 0937   ? ? Subjective Pt report she is doing well. Has a couple questions regarding [facility]. But is happy with her progress at this time.   ? Pertinent History [name] is a 71 y.o. female presenting to physical therapy s/p R TKA 03/26/21. Pt reports to therapy amb with RW. Pt resides in a two story home with six steps to enter from the front and a ramp in the back. She reports negotiating stairs with assistance from her husband. She reports being able to negotiate and live on the main story of her home. Pt reports she would like to increase her R knee mobility, strength, and decrease her pain in order to walk without AD and go bowling. PMH includes breast cancer, fibromyalgia, DM, HTN, anxiety, depression, and hearing loss. Pt denies any unexplained weight fluctuation, saddle paresthesia, unrelenting night pain, or falls in the last 6 months.   ? Limitations Lifting;Walking;Standing;House hold activities   ? How long can you sit comfortably? 5-10 min   ? How long can you stand comfortably? 5 min   ? How long can you walk comfortably? 5-8 minutes   ? Patient Stated Goals Returned and bowling  and restore ROM   ? Currently in Pain? No/denies   ? Pain Score 0-No pain   ? Pain Onset More than a month ago   ? ?  ?  ? ?  ? ? ? ?TREATMENT: ?therex ?Warm up on Nustep BLE level 3 x5 min  ?Quad sets 10 x 5 seconds with LE propped on towel under heel  ?HS stretch 3 x 30 sec  ?[facility] to [facility] and instructed patient and answered all questions regarding progressive HEP ?  ?Patient supine: ?Manual therapy: ?PT performed grade II-III AP mobs tibiofemoral in supine in multiple ranges of motion in addition to patellar glides (superior in tke) with 20-30 sec holds approximately, multiple reps.  ?PT Performed passive hamstring stretch with overpressure 30 sec hold x2  reps; ?Overpressure with knee propped at heel in supine x multiple reps level 3-4 mobilizations throughout between holds.  ?Cross friction massage (CFM) and superior patellar glides with end range holds to patellar tendon in area of pain/ tightness with end range extension  ? ?Right lower extremity extension measured at 3 degrees from full extension.  Right knee flexion within normal limits. ? ?Pt educated throughout session about proper posture and technique with exercises. Improved exercise technique, movement at target joints, use of target muscles after min to mod verbal, visual, tactile cues. ? ? ? ? ? ? ? ? ? ? ? ? ? ? ? ? ? ? ? ? ? ? ? ? ? PT Education - 09/18/21 0939   ? ? Education Details education regarding [facility] activities.   ? Person(s) Educated Patient   ? Methods Explanation   ? Comprehension Verbalized understanding   ? ?  ?  ? ?  ? ? ? PT Short Term Goals - 05/31/21 1112   ? ?  ? PT SHORT TERM GOAL #1  ? Title Pt will demonstrate independence with HEP to improve R LE function for increased ability to participate with ADLs   ? Baseline 11/3: pt asking for review of HEP, confident and independent with HEP, not completing on days when she has higher levele of pain   ? Time 4   ? Period Weeks   ? Status Achieved   ? Target Date 04/30/21   ? ?  ?  ? ?  ? ? ? ? PT Long Term Goals - 08/21/21 1214   ? ?  ? PT LONG TERM GOAL #1  ? Title Patient will increase FOTO score to 53 to demonstrate predicted increase in functional mobility to complete ADLs   ? Baseline 34.5 1/17 55 on 2/21   ? Time 8   ? Period Weeks   ? Status Revised   ? Target Date 09/20/21   ?  ? PT LONG TERM GOAL #2  ? Title Pt will perform 5XSTS in 12 seconds in order to demonstrate improved LE strength and decrease the likelihood of falling.   ? Baseline 04/02/21: 25; 11/3: 11 seconds   ? Time 12   ? Period Weeks   ? Status Achieved   ?  ? PT LONG TERM GOAL #3  ? Title Pt will actively demonstrate 0 deg R knee extension and 120 deg R  knee flexion in order to safely negotiate steps and normalize gait without AD.   ? Baseline 07/17/21: 6 extension, 120 flexion (PROM) 3 degrees extension, 120 fexion   ? Time 8   ? Period Weeks   ? Status On-going   ?  Target Date 09/20/21   ?  ? PT LONG TERM GOAL #4  ? Title Pt will demonstrate SLS of RLE of at least 10sec to demonstrate decreased fall risk and PLOF of contralateral LE   ? Baseline 04/02/21 LLE 10sec RLE unable; 11/3: LLE 15 sec, RLE 11 sec   ? Time 8   ? Period Weeks   ? Status Achieved   ?  ? PT LONG TERM GOAL #5  ? Title Pt will improve 6MWT to greater than 1300 feet in order to indicate improved community ambulation capacity.   ? Baseline 855 feet on 05/29/21 1180 on 07/17/21 1365 on 2/21   ? Time 8   ? Period Weeks   ? Status New   ? Target Date 09/20/21   ? ?  ?  ? ?  ? ? ? ? ? ? ? ? Plan - 09/18/21 0940   ? ? Clinical Impression Statement Patient presents with excellent motivation for completion of physical therapy program.  Patient will be discharged at this time as she has met or nearly met all of her physical therapy goals.  Patient does still have slight limitation in right lower extremity extension range of motion and able to obtain 3 degrees from full extension this date.  Patient instructed in advanced home exercise program during previous sessions and all questions answered regarding this this session.  Patient was also instructed in continuing to work on her knee range of motion over the course of at least 1 full year after surgery as gains can continue to be made utilizing exercises we have shown her in therapy thus far.  Patient be discharged at this time and will follow up as needed with physical therapy or with her physician for ongoing treatment of right knee.  Patient did not have any pain with any activities completed today's began therapy session with no pain in her right knee.   ? Personal Factors and Comorbidities Age;Comorbidity 3+   ? Comorbidities HTN, anxiety, depression    ? Examination-Activity Limitations Bed Mobility;Carry;Lift;Locomotion Level;Squat;Sit   ? Examination-Participation Restrictions Church;Cleaning;Community Activity;Laundry;Driving   ? Stability/Clinical Decision Making [employer]

## 2021-09-20 ENCOUNTER — Encounter: Payer: Medicare PPO | Admitting: Physical Therapy

## 2021-09-20 DIAGNOSIS — M1711 Unilateral primary osteoarthritis, right knee: Secondary | ICD-10-CM | POA: Diagnosis not present

## 2021-09-21 ENCOUNTER — Other Ambulatory Visit: Payer: Self-pay

## 2021-09-21 ENCOUNTER — Ambulatory Visit
Admission: RE | Admit: 2021-09-21 | Discharge: 2021-09-21 | Disposition: A | Discharge status: Home / Self Care | Payer: Medicare PPO | Source: Ambulatory Visit | Attending: Otolaryngology | Admitting: Otolaryngology

## 2021-09-21 DIAGNOSIS — H70219 Acute petrositis, unspecified ear: Secondary | ICD-10-CM

## 2021-09-21 DIAGNOSIS — H9192 Unspecified hearing loss, left ear: Secondary | ICD-10-CM | POA: Diagnosis not present

## 2021-09-25 ENCOUNTER — Encounter: Payer: Medicare PPO | Admitting: Physical Therapy

## 2021-09-27 ENCOUNTER — Encounter: Payer: Medicare PPO | Admitting: Physical Therapy

## 2021-10-19 DIAGNOSIS — Z886 Allergy status to analgesic agent status: Secondary | ICD-10-CM | POA: Diagnosis not present

## 2021-10-19 DIAGNOSIS — H9312 Tinnitus, left ear: Secondary | ICD-10-CM | POA: Diagnosis not present

## 2021-10-19 DIAGNOSIS — H905 Unspecified sensorineural hearing loss: Secondary | ICD-10-CM | POA: Diagnosis not present

## 2021-10-19 DIAGNOSIS — Z885 Allergy status to narcotic agent status: Secondary | ICD-10-CM | POA: Diagnosis not present

## 2021-10-19 DIAGNOSIS — H9041 Sensorineural hearing loss, unilateral, right ear, with unrestricted hearing on the contralateral side: Secondary | ICD-10-CM | POA: Diagnosis not present

## 2021-10-19 DIAGNOSIS — Z91018 Allergy to other foods: Secondary | ICD-10-CM | POA: Diagnosis not present

## 2021-11-13 DIAGNOSIS — I700 Atherosclerosis of aorta: Secondary | ICD-10-CM | POA: Diagnosis not present

## 2021-11-13 DIAGNOSIS — F419 Anxiety disorder, unspecified: Secondary | ICD-10-CM | POA: Diagnosis not present

## 2021-11-13 DIAGNOSIS — E1169 Type 2 diabetes mellitus with other specified complication: Secondary | ICD-10-CM | POA: Diagnosis not present

## 2021-11-13 DIAGNOSIS — E049 Nontoxic goiter, unspecified: Secondary | ICD-10-CM | POA: Diagnosis not present

## 2021-11-13 DIAGNOSIS — R102 Pelvic and perineal pain: Secondary | ICD-10-CM | POA: Diagnosis not present

## 2021-11-13 DIAGNOSIS — E785 Hyperlipidemia, unspecified: Secondary | ICD-10-CM | POA: Diagnosis not present

## 2021-11-13 DIAGNOSIS — N3281 Overactive bladder: Secondary | ICD-10-CM | POA: Diagnosis not present

## 2021-11-13 DIAGNOSIS — M797 Fibromyalgia: Secondary | ICD-10-CM | POA: Diagnosis not present

## 2021-11-13 DIAGNOSIS — I10 Essential (primary) hypertension: Secondary | ICD-10-CM | POA: Diagnosis not present

## 2021-11-20 ENCOUNTER — Ambulatory Visit
Admission: RE | Admit: 2021-11-20 | Discharge: 2021-11-20 | Disposition: A | Discharge status: Home / Self Care | Payer: Medicare PPO | Source: Ambulatory Visit | Attending: Hematology and Oncology | Admitting: Hematology and Oncology

## 2021-11-20 DIAGNOSIS — Z853 Personal history of malignant neoplasm of breast: Secondary | ICD-10-CM | POA: Diagnosis not present

## 2021-11-20 DIAGNOSIS — D0512 Intraductal carcinoma in situ of left breast: Secondary | ICD-10-CM | POA: Diagnosis not present

## 2021-11-20 DIAGNOSIS — C50511 Malignant neoplasm of lower-outer quadrant of right female breast: Secondary | ICD-10-CM

## 2021-11-20 MED ORDER — GADOBUTROL 1 MMOL/ML IV SOLN
6.0000 mL | Freq: Once | INTRAVENOUS | Status: AC | PRN
  Administered 2021-11-20: 6 mL via INTRAVENOUS

## 2022-01-28 DIAGNOSIS — R102 Pelvic and perineal pain: Secondary | ICD-10-CM | POA: Diagnosis not present

## 2022-01-28 DIAGNOSIS — T7840XA Allergy, unspecified, initial encounter: Secondary | ICD-10-CM | POA: Diagnosis not present

## 2022-01-28 DIAGNOSIS — N393 Stress incontinence (female) (male): Secondary | ICD-10-CM | POA: Diagnosis not present

## 2022-02-20 ENCOUNTER — Other Ambulatory Visit: Payer: Self-pay

## 2022-02-20 NOTE — Patient Outreach (Signed)
[facility]) Care Management  02/20/2022  Shannon Hancock 1951/01/19 [id number]   Telephone call to patient for nurse call. Patient states she is out of town and asked for a call back.  Advised that CM would call another time.    Plan: RN CM will attempt again within 4 business days and send letter.  Jone Baseman, RN, MSN [facility] Care Management Care Management Coordinator Direct Line [phone] Toll Free: [phone]  Fax: [phone]

## 2022-02-27 ENCOUNTER — Other Ambulatory Visit: Payer: Self-pay

## 2022-02-27 NOTE — Progress Notes (Signed)
 Patient Care Team: White, Cynthia, MD as PCP - General Toth, Paul III, MD as Consulting Physician (General Surgery) Moody, John, MD as Consulting Physician (Radiation Oncology) Mackey, Heather Thompson, NP as Nurse Practitioner (Hematology and Oncology) Gudena, Vinay, MD as Consulting Physician (Hematology and Oncology)  DIAGNOSIS: No diagnosis found.  SUMMARY OF ONCOLOGIC HISTORY: Oncology History Overview Note  Breast cancer of lower-outer quadrant of right female breast   Staging form: Breast, AJCC 7th Edition     Clinical: Stage IA (T1b, N0, M0) - Unsigned     Pathologic stage from 02/13/2015: Stage IB (T1b, N1mi, cM0) - Unsigned      Breast cancer of lower-outer quadrant of right female breast (HCC)  01/05/2015 Mammogram   Right breast: possible mass warranting further evaluation   01/05/2015 Breast MRI   10 mm diameter enhancing mass in the lower outer right breast suspicious for malignancy   01/18/2015 Mammogram   Diagnostic mammo and ultrasound showed a 0.6 cm mass in the right breast 8:00 location 6 cm from the nipple, and a 0.5 cm lesion in the 10:00 location of the right breast 6 cm from the nipple. Right axilla was negative for adenopathy.   01/19/2015 Initial Biopsy   Right breast 8:00 mass biopsy showed invasive ductal carcinoma and DCIS, grade 1-2, the 10:00 mass biopsy showed fibrocystic change.ER 100% positive, PR 90% positive, HER-2 negative, Ki-67 10%   01/19/2015 Clinical Stage   Stage IA: T1b N0   02/13/2015 Definitive Surgery   Right breast lumpectomy: margins negative; Invasive ductal carcinoma, G1, 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 1 out of 2 lymph nodes were positive for micrometastasis.   02/13/2015 Oncotype testing   RS 12 (8% ROR)   02/13/2015 Pathologic Stage   Stage IB: T1b N1(mi)   03/10/2015 Procedure   Breast/Ovarian Panel (Gene Dx): no clinically significant variant at ATM, BARD1, BRCA1, BRCA2, BRIP1, CDH1, CHEK2, FANCC, MLH1, MSH2, MSH6,  NBN, PALB2, PMS2, PTEN, RAD51C, RAD51D, TP53, and XRCC2.     03/20/2015 - 04/14/2015 Radiation Therapy   Adjuvant XRT (Moody): 42.5 Gy over 17 fractions to the breast using whole-breast tangent fields. Right breast boost 7.5 Gy over 3 fractions. Total dose: 50 Gy   04/19/2015 -  Anti-estrogen oral therapy   Anastrozole 1 mg daily. Planned duration of therapy 5-10 years. Breast cancer index (03/20/20) revealed chance of distant recurrence is 1.2%, no benefit of continued antiestrogen therapy.   07/05/2015 Survivorship   Survivorship care plan completed and mailed to patient in lieu of in person visit.   11/15/2015 Breast MRI   No evidence of breast malignancy. Anterior and lateral skin thickening consistent with radiation change     CHIEF COMPLIANT:   INTERVAL HISTORY: Shannon Hancock is a   ALLERGIES:  is allergic to olive oil and oxycodone.  MEDICATIONS:  Current Outpatient Medications  Medication Sig Dispense Refill   alendronate (FOSAMAX) 70 MG tablet TAKE 1 TABLET BY MOUTH ONCE A WEEK. TAKE WITH A FULL GLASS OF WATER ON AN EMPTY STOMACH. 12 tablet 3   ALPRAZolam (XANAX) 1 MG tablet TAKE 1 TABLET BY MOUTH 4 TIMES A DAY AS NEEDED FOR SLEEP OR ANXIETY (Patient taking differently: Take 0.5-1 mg by mouth 3 (three) times daily as needed for anxiety (Depression).) 120 tablet 0   amLODipine (NORVASC) 5 MG tablet Take 5 mg by mouth daily.     Artificial Tear Ointment (DRY EYES OP) Place 1 drop into both eyes daily as needed (Dry eye).       budesonide-formoterol (SYMBICORT) 80-4.5 MCG/ACT inhaler Inhale 2 puffs into the lungs daily as needed (Wheezing and Asthma).     diphenhydrAMINE (BENADRYL) 25 MG tablet Take 1 tablet (25 mg total) by mouth every 8 (eight) hours as needed. 30 tablet 0   famotidine (PEPCID) 20 MG tablet Take 20 mg by mouth at bedtime.     fexofenadine (ALLEGRA) 180 MG tablet Take 180 mg by mouth daily.     fexofenadine-pseudoephedrine (ALLEGRA-D) 60-120 MG 12 hr tablet  Take 1 tablet by mouth 2 (two) times daily. (Patient not taking: Reported on 03/07/2021) 20 tablet 0   fluticasone (FLONASE) 50 MCG/ACT nasal spray Place 2 sprays into both nostrils daily as needed for allergies or rhinitis.     HYDROmorphone (DILAUDID) 2 MG tablet Take 1-2 tablets (2-4 mg total) by mouth every 6 (six) hours as needed for severe pain. 42 tablet 0   Lactobacillus (PROBIOTIC ACIDOPHILUS PO) Take 1 capsule by mouth daily.     losartan-hydrochlorothiazide (HYZAAR) 100-12.5 MG per tablet TAKE 1 TABLET BY MOUTH DAILY. 30 tablet 0   LYRICA 100 MG capsule TAKE ONE CAPSULE TWICE A DAY 60 capsule 3   methocarbamol (ROBAXIN) 500 MG tablet Take 1 tablet (500 mg total) by mouth every 6 (six) hours as needed for muscle spasms. 40 tablet 0   metoprolol succinate (TOPROL-XL) 25 MG 24 hr tablet Take 25 mg by mouth daily as needed (High blood pressure).     omeprazole (PRILOSEC) 40 MG capsule Take 40 mg by mouth daily.     psyllium (METAMUCIL) 0.52 g capsule Take 1 capsule (0.52 g total) by mouth daily. (Patient taking differently: Take 1.04 g by mouth daily.)     rosuvastatin (CRESTOR) 5 MG tablet Take 5 mg by mouth 2 (two) times a week.     solifenacin (VESICARE) 5 MG tablet Take 5 mg by mouth daily.     sulfamethoxazole-trimethoprim (BACTRIM) 400-80 MG tablet Take 1 tablet by mouth 2 (two) times daily. 14 tablet 0   traMADol (ULTRAM) 50 MG tablet Take 1 tablet (50 mg total) by mouth 3 (three) times daily as needed. (Patient taking differently: Take 50 mg by mouth 2 (two) times daily.) 30 tablet    ZETIA 10 MG tablet TAKE 1 TABLET BY MOUTH DAILY. 90 tablet 1   No current facility-administered medications for this visit.    PHYSICAL EXAMINATION: ECOG PERFORMANCE STATUS: {CHL ONC ECOG PS:[id number]}  There were no vitals filed for this visit. There were no vitals filed for this visit.  BREAST:*** No palpable masses or nodules in either right or left breasts. No palpable axillary supraclavicular  or infraclavicular adenopathy no breast tenderness or nipple discharge. (exam performed in the presence of a chaperone)  LABORATORY DATA:  I have reviewed the data as listed    Latest Ref Rng & Units 03/28/2021    3:30 AM 03/27/2021   12:32 PM 03/26/2021    6:10 AM  CMP  Glucose 70 - 99 mg/dL 117  114  88   BUN 8 - 23 mg/dL 10  10  12   Creatinine 0.44 - 1.00 mg/dL 0.59  0.82  0.68   Sodium 135 - 145 mmol/L 132  137  133   Potassium 3.5 - 5.1 mmol/L 3.0  4.1  4.4   Chloride 98 - 111 mmol/L 97  101  97   CO2 22 - 32 mmol/L 26  21  24   Calcium 8.9 - 10.3 mg/dL 8.3  8.9  9.4       Lab Results  Component Value Date   WBC 14.1 (H) 03/28/2021   HGB 9.4 (L) 03/28/2021   HCT 28.3 (L) 03/28/2021   MCV 79.3 (L) 03/28/2021   PLT 271 03/28/2021   NEUTROABS 3.4 07/27/2019    ASSESSMENT & PLAN:  No problem-specific Assessment & Plan notes found for this encounter.    No orders of the defined types were placed in this encounter.  The patient has a good understanding of the overall plan. she agrees with it. she will call with any problems that may develop before the next visit here. Total time spent: 30 mins including face to face time and time spent for planning, charting and co-ordination of care   Suzzette Righter, [place] 02/27/22    I Gardiner Coins am scribing for Dr. Lindi Adie  ***

## 2022-02-27 NOTE — Patient Outreach (Signed)
[facility]) Care Management  02/27/2022  SHAMERA YARBERRY 1951-05-23 [id number]   Telephone call to patient for nurse call.  No answer.  HIPAA compliant voice message left.    Plan: RN CM will attempt again next week.  Jone Baseman, RN, MSN [facility] Care Management Care Management Coordinator Direct Line [phone] Toll Free: [phone]  Fax: [phone]

## 2022-02-28 ENCOUNTER — Inpatient Hospital Stay: Payer: Medicare PPO | Attending: Hematology and Oncology | Admitting: Hematology and Oncology

## 2022-02-28 ENCOUNTER — Other Ambulatory Visit: Payer: Self-pay

## 2022-02-28 DIAGNOSIS — N6311 Unspecified lump in the right breast, upper outer quadrant: Secondary | ICD-10-CM | POA: Insufficient documentation

## 2022-02-28 DIAGNOSIS — C50511 Malignant neoplasm of lower-outer quadrant of right female breast: Secondary | ICD-10-CM | POA: Diagnosis not present

## 2022-02-28 DIAGNOSIS — R635 Abnormal weight gain: Secondary | ICD-10-CM | POA: Insufficient documentation

## 2022-02-28 DIAGNOSIS — Z79899 Other long term (current) drug therapy: Secondary | ICD-10-CM | POA: Diagnosis not present

## 2022-02-28 DIAGNOSIS — Z170 Estrogen receptor positive status [ER+]: Secondary | ICD-10-CM | POA: Insufficient documentation

## 2022-02-28 DIAGNOSIS — D72829 Elevated white blood cell count, unspecified: Secondary | ICD-10-CM | POA: Insufficient documentation

## 2022-02-28 DIAGNOSIS — M2550 Pain in unspecified joint: Secondary | ICD-10-CM | POA: Insufficient documentation

## 2022-02-28 DIAGNOSIS — M791 Myalgia, unspecified site: Secondary | ICD-10-CM | POA: Insufficient documentation

## 2022-02-28 DIAGNOSIS — Z885 Allergy status to narcotic agent status: Secondary | ICD-10-CM | POA: Diagnosis not present

## 2022-02-28 DIAGNOSIS — Z79811 Long term (current) use of aromatase inhibitors: Secondary | ICD-10-CM | POA: Diagnosis not present

## 2022-02-28 DIAGNOSIS — M8580 Other specified disorders of bone density and structure, unspecified site: Secondary | ICD-10-CM | POA: Diagnosis not present

## 2022-02-28 NOTE — Assessment & Plan Note (Addendum)
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 1 out of 2 lymph nodes were positive for micrometastasis.Oncotype 12 (8% ROR) T1b N1 mic stage IB XRT from 03/20/15 to 04/14/2015  Current treatment: Anastrozole 1 mg daily started November 2016switched to letrozole 03/01/2019 Letrozole toxicities: 1.Diffuse arthralgias and myalgias:Doing better since she got injections in her knees 2.Osteopenia: Bone density 04/23/2018: T score -1.5: Continue with calcium and vitamin Dand Fosamaxand weightbearing exercises. We will obtain a new bone density test in October of this year 3.Weight gain:Monitoring  Breast cancer index 03/20/2020: No benefit from extended adjuvant therapy, risk of distant recurrence 1.2% Discontinued in 2021  Breast cancer surveillance: 1.Breast exam8/31/2023: Benign 2.Mammogram1/03/2022: Benign breast density category C 3.Breast MRI5/23/2023:No evidence of malignancy,breast density category C  She underwent knee replacement surgery. Breast tenderness in the right breast: Sent prescription for bras as well as provided information on livestrong Return to clinic in 1 year for follow-up

## 2022-03-01 ENCOUNTER — Other Ambulatory Visit: Payer: Self-pay

## 2022-03-01 NOTE — Patient Instructions (Signed)
Visit Information  Thank you for taking time to visit with me today. Please don't hesitate to contact me if I can be of assistance to you.   Following are the goals we discussed today:   Goals Addressed             This Visit's Progress    COMPLETED: Care Coordination Activities-no follow up required       Care Coordination Interventions: Advised patient to Schedule Annual Wellness Exam         Jone Baseman, RN, MSN [place] Management Care Management Coordinator Direct Line [phone] Toll Free: [phone]  Fax: [phone]

## 2022-03-01 NOTE — Patient Outreach (Signed)
  Care Coordination   Initial Visit Note   03/01/2022 Name: Shannon Hancock MRN: [id number] DOB: 09-01-1950  Shannon Hancock is a 71 y.o. year old female who sees [name], MD for primary care. I spoke with  Shannon Hancock by phone today.  What matters to the patients health and wellness today?  Nothing at this time    Goals Addressed             This Visit's Progress    COMPLETED: Care Coordination Activities-no follow up required       Care Coordination Interventions: Advised patient to Schedule Annual Wellness Exam           SDOH assessments and interventions completed:  No     Care Coordination Interventions Activated:  Yes  Care Coordination Interventions:  Yes, provided   Follow up plan: No further intervention required.   Encounter Outcome:  Pt. Visit Completed   Jone Baseman, RN, MSN [place] Management Care Management Coordinator Direct Line [phone] Toll Free: [phone]  Fax: [phone]

## 2022-03-05 ENCOUNTER — Telehealth: Payer: Self-pay | Admitting: Hematology and Oncology

## 2022-03-05 DIAGNOSIS — J069 Acute upper respiratory infection, unspecified: Secondary | ICD-10-CM | POA: Diagnosis not present

## 2022-03-05 DIAGNOSIS — J189 Pneumonia, unspecified organism: Secondary | ICD-10-CM | POA: Diagnosis not present

## 2022-03-05 NOTE — Telephone Encounter (Signed)
Scheduled appointment per 8/31 los. Patient is aware.

## 2022-03-06 ENCOUNTER — Emergency Department: Payer: Medicare PPO

## 2022-03-06 ENCOUNTER — Emergency Department
Admission: EM | Admit: 2022-03-06 | Discharge: 2022-03-06 | Disposition: A | Discharge status: Home / Self Care | Payer: Medicare PPO | Attending: Emergency Medicine | Admitting: Emergency Medicine

## 2022-03-06 DIAGNOSIS — J189 Pneumonia, unspecified organism: Secondary | ICD-10-CM | POA: Diagnosis not present

## 2022-03-06 DIAGNOSIS — R0789 Other chest pain: Secondary | ICD-10-CM | POA: Diagnosis not present

## 2022-03-06 DIAGNOSIS — R0602 Shortness of breath: Secondary | ICD-10-CM | POA: Diagnosis not present

## 2022-03-06 DIAGNOSIS — R079 Chest pain, unspecified: Secondary | ICD-10-CM | POA: Diagnosis not present

## 2022-03-06 DIAGNOSIS — Z20822 Contact with and (suspected) exposure to covid-19: Secondary | ICD-10-CM | POA: Insufficient documentation

## 2022-03-06 DIAGNOSIS — J181 Lobar pneumonia, unspecified organism: Secondary | ICD-10-CM | POA: Insufficient documentation

## 2022-03-06 DIAGNOSIS — J168 Pneumonia due to other specified infectious organisms: Secondary | ICD-10-CM | POA: Diagnosis not present

## 2022-03-06 DIAGNOSIS — R531 Weakness: Secondary | ICD-10-CM | POA: Diagnosis not present

## 2022-03-06 DIAGNOSIS — I959 Hypotension, unspecified: Secondary | ICD-10-CM | POA: Diagnosis not present

## 2022-03-06 DIAGNOSIS — I10 Essential (primary) hypertension: Secondary | ICD-10-CM | POA: Insufficient documentation

## 2022-03-06 LAB — CBC WITH DIFFERENTIAL/PLATELET
Abs Immature Granulocytes: 0.05 10*3/uL (ref 0.00–0.07)
Basophils Absolute: 0 10*3/uL (ref 0.0–0.1)
Basophils Relative: 0 %
Eosinophils Absolute: 0.1 10*3/uL (ref 0.0–0.5)
Eosinophils Relative: 1 %
HCT: 35.1 % — ABNORMAL LOW (ref 36.0–46.0)
Hemoglobin: 11.3 g/dL — ABNORMAL LOW (ref 12.0–15.0)
Immature Granulocytes: 0 %
Lymphocytes Relative: 14 %
Lymphs Abs: 1.7 10*3/uL (ref 0.7–4.0)
MCH: 26 pg (ref 26.0–34.0)
MCHC: 32.2 g/dL (ref 30.0–36.0)
MCV: 80.7 fL (ref 80.0–100.0)
Monocytes Absolute: 1.4 10*3/uL — ABNORMAL HIGH (ref 0.1–1.0)
Monocytes Relative: 11 %
Neutro Abs: 9.2 10*3/uL — ABNORMAL HIGH (ref 1.7–7.7)
Neutrophils Relative %: 74 %
Platelets: 315 10*3/uL (ref 150–400)
RBC: 4.35 MIL/uL (ref 3.87–5.11)
RDW: 13.5 % (ref 11.5–15.5)
WBC: 12.5 10*3/uL — ABNORMAL HIGH (ref 4.0–10.5)
nRBC: 0 % (ref 0.0–0.2)

## 2022-03-06 LAB — BASIC METABOLIC PANEL
Anion gap: 12 (ref 5–15)
BUN: 10 mg/dL (ref 8–23)
CO2: 25 mmol/L (ref 22–32)
Calcium: 9.2 mg/dL (ref 8.9–10.3)
Chloride: 96 mmol/L — ABNORMAL LOW (ref 98–111)
Creatinine, Ser: 0.83 mg/dL (ref 0.44–1.00)
GFR, Estimated: >60 mL/min (ref >60)
Glucose, Bld: 93 mg/dL (ref 70–99)
Potassium: 3.7 mmol/L (ref 3.5–5.1)
Sodium: 133 mmol/L — ABNORMAL LOW (ref 135–145)

## 2022-03-06 LAB — SARS CORONAVIRUS 2 BY RT PCR: SARS Coronavirus 2 by RT PCR: NEGATIVE

## 2022-03-06 MED ORDER — IPRATROPIUM-ALBUTEROL 0.5-2.5 (3) MG/3ML IN SOLN
3.0000 mL | Freq: Once | RESPIRATORY_TRACT | Status: AC
  Administered 2022-03-06: 3 mL via RESPIRATORY_TRACT
  Filled 2022-03-06: qty 3

## 2022-03-06 MED ORDER — PREDNISONE 20 MG PO TABS
40.0000 mg | ORAL_TABLET | Freq: Every day | ORAL | 0 refills | Status: AC
Start: 2022-03-06 — End: 2022-03-10

## 2022-03-06 MED ORDER — METHYLPREDNISOLONE SODIUM SUCC 125 MG IJ SOLR
80.0000 mg | INTRAMUSCULAR | Status: AC
  Administered 2022-03-06: 80 mg via INTRAVENOUS
  Filled 2022-03-06: qty 2

## 2022-03-06 MED ORDER — SODIUM CHLORIDE 0.9 % IV BOLUS
500.0000 mL | Freq: Once | INTRAVENOUS | Status: AC
  Administered 2022-03-06: 500 mL via INTRAVENOUS

## 2022-03-06 NOTE — ED Notes (Signed)
Pt A&O, IV removed, pt given discharge instructions, pt assisted by family to vehicle.

## 2022-03-06 NOTE — ED Provider Notes (Signed)
[facility] Provider Note    Event Date/Time   First MD Initiated Contact with Patient 03/06/22 [phone]     (approximate)   History   Chief Complaint: Chest Pain (C/o chest pain approx 7am, recent diagnosis pneumonia day 2 on antibiotics, mid sternal chest pain/)   HPI  Shannon Hancock is a 71 y.o. female with a history of fibromyalgia, GERD, anxiety, hypertension, depression who comes the ED complaining of intermittent chest pain and shortness of breath for the past 2 days.  She went to urgent care yesterday and was diagnosed with pneumonia and started on Augmentin and doxycycline.  Symptoms started after being in a car with a family member who is also sick.  She has been taking the antibiotics since yesterday along with Tessalon and guaifenesin codeine solution without relief.  Chest pain is achy, nonradiating, no aggravating or alleviating factors.  No diaphoresis or vomiting.  No pleuritic or exertional symptoms.  Currently she is pain-free.  The symptoms were off and on throughout the night and predominantly her major symptom was coughing.  She comes to the ED for reassessment of the symptoms due to the persistent cough.     Physical Exam   Triage Vital Signs: ED Triage Vitals  Enc Vitals Group     BP 03/06/22 0928 118/63     Pulse Rate 03/06/22 0928 74     Resp --      Temp 03/06/22 0928 98.8 F (37.1 C)     Temp Source 03/06/22 0928 Oral     SpO2 03/06/22 0928 97 %     Weight --      Height --      Head Circumference --      Peak Flow --      Pain Score 03/06/22 0933 0     Pain Loc --      Pain Edu? --      Excl. in [place]? --     Most recent vital signs: Vitals:   03/06/22 0928  BP: 118/63  Pulse: 74  Temp: 98.8 F (37.1 C)  SpO2: 97%    General: Awake, no distress.  CV:  Good peripheral perfusion.  Regular rate and rhythm Resp:  Normal effort.  Clear to auscultation bilaterally.  Inducible wheezing with cough Abd:  No distention.   Soft and nontender Other:  No lower extremity edema.  Symmetric calf circumference.  No calf tenderness.  Negative Homans' sign.   ED Results / Procedures / Treatments   Labs (all labs ordered are listed, but only abnormal results are displayed) Labs Reviewed  BASIC METABOLIC PANEL - Abnormal; Notable for the following components:      Result Value   Sodium 133 (*)    Chloride 96 (*)    All other components within normal limits  CBC WITH DIFFERENTIAL/PLATELET - Abnormal; Notable for the following components:   WBC 12.5 (*)    Hemoglobin 11.3 (*)    HCT 35.1 (*)    Neutro Abs 9.2 (*)    Monocytes Absolute 1.4 (*)    All other components within normal limits  SARS CORONAVIRUS 2 BY RT PCR     EKG Interpreted by me Sinus rhythm rate of 68.  Normal axis and intervals.  Normal QRS, ST segments, and T waves.  No ischemic changes   RADIOLOGY Chest x-ray interpreted by me, shows mild right basilar patchy infiltrate suggestive of pneumonia, possibly viral.  Radiology report reviewed.   PROCEDURES:  Procedures   MEDICATIONS ORDERED IN ED: Medications  sodium chloride 0.9 % bolus 500 mL (0 mLs Intravenous Stopped 03/06/22 1143)  methylPREDNISolone sodium succinate (SOLU-MEDROL) 125 mg/2 mL injection 80 mg (80 mg Intravenous Given 03/06/22 1044)  ipratropium-albuterol (DUONEB) 0.5-2.5 (3) MG/3ML nebulizer solution 3 mL (3 mLs Nebulization Given 03/06/22 1044)     IMPRESSION / MDM / ASSESSMENT AND PLAN / ED COURSE  I reviewed the triage vital signs and the nursing notes.                              Differential diagnosis includes, but is not limited to, pneumonia, pleural effusion, pulmonary edema, AKI, electrolyte abnormality, anemia, COVID  Patient's presentation is most consistent with acute presentation with potential threat to life or bodily function.  Patient comes to the ED with intermittent shortness of breath and chest pain which are atypical.  Doubt ACS PE dissection  pericarditis.  Vital signs are normal.  Will check labs, repeat chest x-ray today, COVID test.   Clinical Course as of 03/06/22 1216  Wed Mar 06, 2022  1004 P/t has h/o asthma / chronic lung disease. Will give solumedrol and duoneb for sx relief.  [PS]  [id number] Radiology read confirms right lower lobe pneumonia. [PS]    Clinical Course User Index [PS] Carrie Mew, MD    ----------------------------------------- 12:16 PM on 03/06/2022 ----------------------------------------- Vitals including room air oxygenation are normal.  Patient feeling well.  Symptoms improved after DuoNeb and Solu-Medrol.  We will continue on oral prednisone, inhalers at home, continue antibiotics.  COVID-negative.  Not requiring admission at this time.   FINAL CLINICAL IMPRESSION(S) / ED DIAGNOSES   Final diagnoses:  Pneumonia of right lower lobe due to infectious organism     Rx / DC Orders   ED Discharge Orders          Ordered    predniSONE (DELTASONE) 20 MG tablet  Daily with breakfast        03/06/22 1216             Note:  This document was prepared using Dragon voice recognition software and may include unintentional dictation errors.   Carrie Mew, MD 03/06/22 1217

## 2022-03-12 DIAGNOSIS — R058 Other specified cough: Secondary | ICD-10-CM | POA: Diagnosis not present

## 2022-03-12 DIAGNOSIS — L293 Anogenital pruritus, unspecified: Secondary | ICD-10-CM | POA: Diagnosis not present

## 2022-03-12 DIAGNOSIS — J4520 Mild intermittent asthma, uncomplicated: Secondary | ICD-10-CM | POA: Diagnosis not present

## 2022-03-12 DIAGNOSIS — J189 Pneumonia, unspecified organism: Secondary | ICD-10-CM | POA: Diagnosis not present

## 2022-03-12 DIAGNOSIS — E1169 Type 2 diabetes mellitus with other specified complication: Secondary | ICD-10-CM | POA: Diagnosis not present

## 2022-03-12 DIAGNOSIS — I10 Essential (primary) hypertension: Secondary | ICD-10-CM | POA: Diagnosis not present

## 2022-03-27 ENCOUNTER — Ambulatory Visit
Admission: RE | Admit: 2022-03-27 | Discharge: 2022-03-27 | Disposition: A | Discharge status: Home / Self Care | Payer: Medicare PPO | Source: Ambulatory Visit | Attending: Family Medicine | Admitting: Family Medicine

## 2022-03-27 ENCOUNTER — Other Ambulatory Visit: Payer: Self-pay | Admitting: Family Medicine

## 2022-03-27 DIAGNOSIS — I700 Atherosclerosis of aorta: Secondary | ICD-10-CM | POA: Diagnosis not present

## 2022-03-27 DIAGNOSIS — J189 Pneumonia, unspecified organism: Secondary | ICD-10-CM | POA: Diagnosis not present

## 2022-03-27 DIAGNOSIS — J4520 Mild intermittent asthma, uncomplicated: Secondary | ICD-10-CM | POA: Diagnosis not present

## 2022-03-27 DIAGNOSIS — F3341 Major depressive disorder, recurrent, in partial remission: Secondary | ICD-10-CM | POA: Diagnosis not present

## 2022-03-27 DIAGNOSIS — K219 Gastro-esophageal reflux disease without esophagitis: Secondary | ICD-10-CM | POA: Diagnosis not present

## 2022-03-27 DIAGNOSIS — E785 Hyperlipidemia, unspecified: Secondary | ICD-10-CM | POA: Diagnosis not present

## 2022-03-27 DIAGNOSIS — R059 Cough, unspecified: Secondary | ICD-10-CM | POA: Diagnosis not present

## 2022-03-27 DIAGNOSIS — E1169 Type 2 diabetes mellitus with other specified complication: Secondary | ICD-10-CM | POA: Diagnosis not present

## 2022-03-27 DIAGNOSIS — I10 Essential (primary) hypertension: Secondary | ICD-10-CM | POA: Diagnosis not present

## 2022-03-27 DIAGNOSIS — J301 Allergic rhinitis due to pollen: Secondary | ICD-10-CM | POA: Diagnosis not present

## 2022-03-27 DIAGNOSIS — R052 Subacute cough: Secondary | ICD-10-CM | POA: Diagnosis not present

## 2022-05-01 DIAGNOSIS — N393 Stress incontinence (female) (male): Secondary | ICD-10-CM | POA: Diagnosis not present

## 2022-05-03 DIAGNOSIS — Z96651 Presence of right artificial knee joint: Secondary | ICD-10-CM | POA: Diagnosis not present

## 2022-05-28 DIAGNOSIS — R051 Acute cough: Secondary | ICD-10-CM | POA: Diagnosis not present

## 2022-05-28 DIAGNOSIS — U071 COVID-19: Secondary | ICD-10-CM | POA: Diagnosis not present

## 2022-05-28 DIAGNOSIS — J40 Bronchitis, not specified as acute or chronic: Secondary | ICD-10-CM | POA: Diagnosis not present

## 2022-05-28 DIAGNOSIS — R5383 Other fatigue: Secondary | ICD-10-CM | POA: Diagnosis not present

## 2022-05-28 DIAGNOSIS — Z8701 Personal history of pneumonia (recurrent): Secondary | ICD-10-CM | POA: Diagnosis not present

## 2022-05-28 DIAGNOSIS — Z03818 Encounter for observation for suspected exposure to other biological agents ruled out: Secondary | ICD-10-CM | POA: Diagnosis not present

## 2022-05-28 DIAGNOSIS — R059 Cough, unspecified: Secondary | ICD-10-CM | POA: Diagnosis not present

## 2022-06-17 ENCOUNTER — Other Ambulatory Visit: Payer: Self-pay | Admitting: Hematology and Oncology

## 2022-07-22 ENCOUNTER — Ambulatory Visit: Payer: Medicare PPO

## 2022-07-24 ENCOUNTER — Ambulatory Visit: Payer: Medicare PPO | Attending: Family Medicine

## 2022-07-24 DIAGNOSIS — M6281 Muscle weakness (generalized): Secondary | ICD-10-CM | POA: Diagnosis not present

## 2022-07-24 DIAGNOSIS — R262 Difficulty in walking, not elsewhere classified: Secondary | ICD-10-CM | POA: Insufficient documentation

## 2022-07-24 DIAGNOSIS — R269 Unspecified abnormalities of gait and mobility: Secondary | ICD-10-CM | POA: Diagnosis not present

## 2022-07-24 NOTE — Therapy (Signed)
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; ER/PR+, Her2-, 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%   Depression    Diabetes mellitus without complication (HCC)    Diverticulosis    Fibromyalgia    GERD (gastroesophageal reflux disease)    Heart murmur    "no murmur" documented in PCP note by [name], MD 02/09/21   High cholesterol    Hypertension    Joint pain    Past Surgical History:  Procedure Laterality Date   BREAST LUMPECTOMY Right 2016   BREAST LUMPECTOMY WITH RADIOACTIVE SEED AND SENTINEL LYMPH NODE BIOPSY Right 02/13/2015   Procedure: RIGHT BREAST LUMPECTOMY WITH RADIOACTIVE SEED AND SENTINEL LYMPH NODE MAPPING;  Surgeon: [name], MD;  Location: [place];  Service: General;  Laterality: Right;   FOOT SURGERY Left    TOTAL KNEE ARTHROPLASTY Right 03/26/2021   Procedure: TOTAL KNEE ARTHROPLASTY;  Surgeon: [name], MD;  Location: [facility];  Service: Orthopedics;  Laterality: Right;   TUBAL LIGATION     Patient Active Problem List   Diagnosis Date Noted   OA (osteoarthritis) of knee 03/26/2021   Primary osteoarthritis of right knee 03/26/2021   Leukocytosis 04/19/2020   Thyroid cyst 02/26/2018   Plantar fasciitis 02/19/2016   Family history of breast cancer in sister 03/10/2015   Breast cancer of lower-outer quadrant of right female breast ([place]) 02/10/2015   Abdominal bloating  12/16/2013   Edema 11/09/2013   Urge incontinence 05/05/2013   Atrophic vaginitis 05/05/2013   Syncope 02/15/2013   Irritable bowel syndrome 08/20/2012   Hearing loss 11/07/2011   Generalized anxiety disorder 11/07/2011   Peripheral neuropathy 07/05/2011   Osteoporosis 07/05/2011   Tinnitus, bilateral 04/05/2011   HYPERCHOLESTEROLEMIA 04/05/2010   ANEMIA, IRON DEFICIENCY 04/03/2010   DEPRESSION, MAJOR, MODERATE 04/03/2010   Essential hypertension 04/03/2010   ALLERGIC RHINITIS 04/03/2010   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/03/2010   POSTMENOPAUSAL SYNDROME 04/03/2010   DEGENERATIVE JOINT DISEASE, CERVICAL SPINE 04/03/2010   FIBROMYALGIA 04/03/2010    PCP: [name] MD  REFERRING PROVIDER: Sharion Dove, MD  REFERRING DIAG: Z96.651 (ICD-10-CM) - Presence of right artificial knee joint  THERAPY DIAG:  Difficulty in walking, not elsewhere classified  Muscle weakness (generalized)  Abnormality of gait and mobility  Rationale for Evaluation and Treatment: Rehabilitation  ONSET DATE: 03/16/2021  SUBJECTIVE:   SUBJECTIVE STATEMENT: Pt is a 72 y.o. female returning to PT for R TKA. Previously here from Fall of 2022 through Spring of 2023 for similar concern. PMH includes: Breast Cx, DM, fibromyalgia, GERD, HTN, High cholesterol.  PERTINENT HISTORY: Pt is a 72 y.o. female returning to PT for R TKA. Previously here from Fall of 2022 through Spring of 2023 for similar concern. PMH includes: Breast Cx, DM, fibromyalgia, GERD, HTN,  High cholesterol. Pt well known from previous PT encounter. Reports trying [facility], and reports she was unsuccessful in improving her R knee TKE and lacks needed endurance with her walking. Also has swelling and pain with driving for prolonged trips. Reports continuous swelling and pain in her knee. Has had 1 "light fall" tripping over some clothes but not related to her R knee condition. Able to get up on her own without injury. Her worst  pain in her knee up to a 9/10 leading her to tense up. Normal pain levels up to a 6-7/10 with walking tasks and ADL's. Her biggest concern is her lack of AROM in her R knee. Pt's goal is to improve her R knee AROM to where it was, and to improvement in her swelling. Declines fever and cold chills. Has had f/u with [facility] with pt stating MD's had no concerns. Has appt next week.   PAIN:  Are you having pain? Yes: NPRS scale: 0/10 Pain location: R knee, L knee and lower back  Pain description: aching and discomfort Aggravating factors: Walking Relieving factors: rest  PRECAUTIONS: None  WEIGHT BEARING RESTRICTIONS: No  FALLS:  Has patient fallen in last 6 months? Yes. Number of falls 1  LIVING ENVIRONMENT: Lives with: lives with their family Lives in: House/apartment Stairs: Yes: External: 5 steps; on right going up, on left going up, and bilateral but cannot reach both   OCCUPATION: Retired  PLOF: Independent  PATIENT GOALS: Improve R knee AROM, be able to garden, be more active, improve R knee swelling.  NEXT MD VISIT: N/A  OBJECTIVE:   PATIENT SURVEYS:  FOTO Deferred to next session  COGNITION: Overall cognitive status: Within functional limits for tasks assessed     SENSATION: WFL  EDEMA:  R knee with mild warmth compared to LLE.     POSTURE: weight shift left in standing  PALPATION: Mild warmth to R knee compared to LLE. Not painful to palpation. Scar fully healed, good mobility of scar tissue.  LOWER EXTREMITY ROM:  Active ROM Right eval Left eval  Hip flexion    Hip extension    Hip abduction    Hip adduction    Hip internal rotation 25 27  Hip external rotation 23 30  Knee flexion 105 120  Knee extension 10 0  Ankle dorsiflexion    Ankle plantarflexion    Ankle inversion    Ankle eversion     (Blank rows = not tested)  LOWER EXTREMITY MMT:  MMT Right eval Left eval  Hip flexion 4/5 5/5  Hip extension    Hip abduction 4/5 4/5   Hip adduction    Hip internal rotation 3/5 4/5  Hip external rotation 4/5 4/5  Knee flexion 5/5 5/5  Knee extension 5/5 5/5  Ankle dorsiflexion 5/5   Ankle plantarflexion    Ankle inversion    Ankle eversion     (Blank rows = not tested)  LOWER EXTREMITY JOINT MOBILITY  AP/PA TIBIOFEMORAL MOBILIZATIONS: DEFERRED TO NEXT SESSION PATELLAR MOBILIZATIONS: DEFERRED TO NEXT SESSION  FUNCTIONAL TESTS:  5 times sit to stand: 17sec 6 minute walk test: 1,129' 10 meter walk test: 0.86 M/S  30 sec STS: 11 reps GAIT: Distance walked: > 1000' Assistive device utilized: None Level of assistance: Complete Independence Comments: Pt ambulates with increased weight shift onto LLE during stance phase with B trendelenburg that worsens as pt fatigue. Consistent R toe out during gait.    TODAY'S TREATMENT:  DATE: Educated on HEP     PATIENT EDUCATION:  Education details: Prognosis, exam findings, POC, HEP (reps/sets/frequency) Person educated: Patient Education method: Explanation, Demonstration, and Handouts Education comprehension: verbalized understanding and needs further education  HOME EXERCISE PROGRAM: Access Code: [email or web address] URL: [email or web address] Date: 07/24/2022 Prepared by: Larna Daughters  Exercises - Supine Quad Set  - 1 x daily - 7 x weekly - 3 sets - 12 reps - Supine Heel Slide  - 1 x daily - 7 x weekly - 3 sets - 15 reps - Mini Squat with Counter Support  - 1 x daily - 5 x weekly - 3 sets - 8 reps - Active Straight Leg Raise with Quad Set  - 1 x daily - 5 x weekly - 3 sets - 8 reps - Sidelying Hip Abduction  - 1 x daily - 5 x weekly - 3 sets - 8 reps  ASSESSMENT:  CLINICAL IMPRESSION: Patient is a 72 y.o. female who was seen today for physical therapy evaluation and treatment for deficits from previous R TKA. Pt presents with  decreased R knee AROM and muscle weakness compared to LLE also evidenced via 5xSTS and 30 sec STS being below age matched norms indicating increased falls risk. These impairments are limiting pt in completing prolonged travel/driving tasks, ambulating community distances and recreational activity like gardening that require adequate knee AROM for getting to and up from the ground. Pt will benefit from skilled PT services to address these impairments to decrease falls risk  and improve QoL.  OBJECTIVE IMPAIRMENTS: Abnormal gait, decreased activity tolerance, decreased mobility, difficulty walking, decreased ROM, decreased strength, and pain.   ACTIVITY LIMITATIONS: bending, standing, squatting, and locomotion level  PARTICIPATION LIMITATIONS: driving, community activity, and yard work  PERSONAL FACTORS: Age, Fitness, Past/current experiences, Time since onset of injury/illness/exacerbation, and 3+ comorbidities: Breast cancer, DM, fibromyalgia, GERD, HTN, High cholesterol  are also affecting patient's functional outcome.   REHAB POTENTIAL: Good  CLINICAL DECISION MAKING: Stable/uncomplicated  EVALUATION COMPLEXITY: Low   GOALS: Goals reviewed with patient? No  SHORT TERM GOALS: Target date: 08/21/22  Pt will be independent with HEP to improve muscle strength and R knee AROM to decrease falls risk and improve overall functional mobility with ADL's. Baseline: Given HEP Goal status: INITIAL  LONG TERM GOALS: Target date: 09/18/22  Pt will improve 5xSTS to < 12.6 seconds to demonstrate clinically significant improvement in LE strength for age matched norms.  Baseline: 17 seconds Goal status: INITIAL  2.  Pt will improve self selected 10 meter gait speed to > 1.0 m/s to demonstrate decreased falls risk for limited community ambulation.  Baseline: 0.86 m/s Goal status: INITIAL  3.  Pt will improve FOTO to target score to demonstrate clinically significant improvement in functional  mobility.  Baseline: Deferred to next session Goal status: INITIAL  4.  Pt will improve 30 second STS test to 14 reps to match age matched norms to demonstrate improved LE endurance for standing and walking ADL's. Baseline: 11 reps Goal status: INITIAL  PLAN:  PT FREQUENCY: 2x/week  PT DURATION: 8 weeks  PLANNED INTERVENTIONS: Therapeutic exercises, Therapeutic activity, Neuromuscular re-education, Balance training, Gait training, Patient/Family education, Joint mobilization, Stair training, Electrical stimulation, Cryotherapy, scar mobilization, and Manual therapy  PLAN FOR NEXT SESSION: FOTO, joint mobilization assessment, reassess HEP understanding   Salem Caster. Fairly IV, PT, DPT Physical Therapist- [facility]  07/24/2022, 10:30 AM

## 2022-07-29 ENCOUNTER — Ambulatory Visit: Payer: Medicare PPO

## 2022-07-31 ENCOUNTER — Ambulatory Visit: Payer: Medicare PPO

## 2022-07-31 NOTE — Therapy (Incomplete)
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%   Depression    Diabetes mellitus without complication (HCC)    Diverticulosis    Fibromyalgia    GERD (gastroesophageal reflux disease)    Heart murmur    "no murmur" documented in PCP note by [name], MD 02/09/21   High cholesterol    Hypertension    Joint pain    Past Surgical History:  Procedure Laterality Date   BREAST LUMPECTOMY Right 2016   BREAST LUMPECTOMY WITH RADIOACTIVE SEED AND SENTINEL LYMPH NODE BIOPSY Right 02/13/2015   Procedure: RIGHT BREAST LUMPECTOMY WITH RADIOACTIVE SEED AND SENTINEL LYMPH NODE MAPPING;  Surgeon: [name], MD;  Location: [place];  Service: General;  Laterality: Right;   FOOT SURGERY Left    TOTAL KNEE ARTHROPLASTY Right 03/26/2021   Procedure: TOTAL KNEE ARTHROPLASTY;  Surgeon: [name], MD;  Location: [facility];  Service: Orthopedics;  Laterality: Right;   TUBAL LIGATION     Patient Active Problem List   Diagnosis Date Noted   OA (osteoarthritis) of knee 03/26/2021   Primary osteoarthritis of right knee 03/26/2021   Leukocytosis 04/19/2020   Thyroid cyst 02/26/2018   Plantar fasciitis 02/19/2016   Family history of breast cancer in sister 03/10/2015   Breast cancer of lower-outer quadrant of right female breast ([place]) 02/10/2015   Abdominal bloating 12/16/2013   Edema 11/09/2013   Urge incontinence 05/05/2013   Atrophic vaginitis 05/05/2013   Syncope 02/15/2013   Irritable bowel syndrome 08/20/2012   Hearing loss 11/07/2011   Generalized anxiety disorder 11/07/2011   Peripheral neuropathy 07/05/2011   Osteoporosis 07/05/2011   Tinnitus, bilateral 04/05/2011    HYPERCHOLESTEROLEMIA 04/05/2010   ANEMIA, IRON DEFICIENCY 04/03/2010   DEPRESSION, MAJOR, MODERATE 04/03/2010   Essential hypertension 04/03/2010   ALLERGIC RHINITIS 04/03/2010   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/03/2010   POSTMENOPAUSAL SYNDROME 04/03/2010   DEGENERATIVE JOINT DISEASE, CERVICAL SPINE 04/03/2010   FIBROMYALGIA 04/03/2010    PCP: [name] MD  REFERRING PROVIDER: Sharion Dove, MD  REFERRING DIAG: Z96.651 (ICD-10-CM) - Presence of right artificial knee joint  THERAPY DIAG:  No diagnosis found.  Rationale for Evaluation and Treatment: Rehabilitation  ONSET DATE: 03/16/2021  SUBJECTIVE:   SUBJECTIVE STATEMENT: Pt is a 72 y.o. female returning to PT for R TKA. Previously here from Fall of 2022 through Spring of 2023 for similar concern. PMH includes: Breast Cx, DM, fibromyalgia, GERD, HTN, High cholesterol.  PERTINENT HISTORY: Pt is a 72 y.o. female returning to PT for R TKA. Previously here from Fall of 2022 through Spring of 2023 for similar concern. PMH includes: Breast Cx, DM, fibromyalgia, GERD, HTN, High cholesterol. Pt well known from previous PT encounter. Reports trying [facility], and reports she was unsuccessful in improving her R knee TKE and lacks needed endurance with her walking. Also has swelling and pain with driving for prolonged trips. Reports continuous swelling and pain in her knee. Has had 1 "light fall" tripping over some clothes but not related to her R knee condition. Able to get up on her own without injury. Her worst pain in her knee up to a 9/10 leading her to tense up. Normal pain levels  up to a 6-7/10 with walking tasks and ADL's. Her biggest concern is her lack of AROM in her R knee. Pt's goal is to improve her R knee AROM to where it was, and to improvement in her swelling. Declines fever and cold chills. Has had f/u with [facility] with pt stating MD's had no concerns. Has appt next week.   PAIN:  Are you having pain? Yes:  NPRS scale: 0/10 Pain location: R knee, L knee and lower back  Pain description: aching and discomfort Aggravating factors: Walking Relieving factors: rest  PRECAUTIONS: None  WEIGHT BEARING RESTRICTIONS: No  FALLS:  Has patient fallen in last 6 months? Yes. Number of falls 1  LIVING ENVIRONMENT: Lives with: lives with their family Lives in: House/apartment Stairs: Yes: External: 5 steps; on right going up, on left going up, and bilateral but cannot reach both   OCCUPATION: Retired  PLOF: Independent  PATIENT GOALS: Improve R knee AROM, be able to garden, be more active, improve R knee swelling.  NEXT MD VISIT: N/A  OBJECTIVE:   PATIENT SURVEYS:  FOTO Deferred to next session  COGNITION: Overall cognitive status: Within functional limits for tasks assessed     SENSATION: WFL  EDEMA:  R knee with mild warmth compared to LLE.     POSTURE: weight shift left in standing  PALPATION: Mild warmth to R knee compared to LLE. Not painful to palpation. Scar fully healed, good mobility of scar tissue.  LOWER EXTREMITY ROM:  Active ROM Right eval Left eval  Hip flexion    Hip extension    Hip abduction    Hip adduction    Hip internal rotation 25 27  Hip external rotation 23 30  Knee flexion 105 120  Knee extension 10 0  Ankle dorsiflexion    Ankle plantarflexion    Ankle inversion    Ankle eversion     (Blank rows = not tested)  LOWER EXTREMITY MMT:  MMT Right eval Left eval  Hip flexion 4/5 5/5  Hip extension    Hip abduction 4/5 4/5  Hip adduction    Hip internal rotation 3/5 4/5  Hip external rotation 4/5 4/5  Knee flexion 5/5 5/5  Knee extension 5/5 5/5  Ankle dorsiflexion 5/5   Ankle plantarflexion    Ankle inversion    Ankle eversion     (Blank rows = not tested)  LOWER EXTREMITY JOINT MOBILITY  AP/PA TIBIOFEMORAL MOBILIZATIONS: DEFERRED TO NEXT SESSION PATELLAR MOBILIZATIONS: DEFERRED TO NEXT SESSION  FUNCTIONAL TESTS:  5 times sit  to stand: 17sec 6 minute walk test: 1,129' 10 meter walk test: 0.86 M/S  30 sec STS: 11 reps GAIT: Distance walked: > 1000' Assistive device utilized: None Level of assistance: Complete Independence Comments: Pt ambulates with increased weight shift onto LLE during stance phase with B trendelenburg that worsens as pt fatigue. Consistent R toe out during gait.    TODAY'S TREATMENT:  DATE: Educated on HEP     PATIENT EDUCATION:  Education details: Prognosis, exam findings, POC, HEP (reps/sets/frequency) Person educated: Patient Education method: Explanation, Demonstration, and Handouts Education comprehension: verbalized understanding and needs further education  HOME EXERCISE PROGRAM: Access Code: [email or web address] URL: [email or web address] Date: 07/24/2022 Prepared by: Larna Daughters  Exercises - Supine Quad Set  - 1 x daily - 7 x weekly - 3 sets - 12 reps - Supine Heel Slide  - 1 x daily - 7 x weekly - 3 sets - 15 reps - Mini Squat with Counter Support  - 1 x daily - 5 x weekly - 3 sets - 8 reps - Active Straight Leg Raise with Quad Set  - 1 x daily - 5 x weekly - 3 sets - 8 reps - Sidelying Hip Abduction  - 1 x daily - 5 x weekly - 3 sets - 8 reps  ASSESSMENT:  CLINICAL IMPRESSION: Patient is a 72 y.o. female who was seen today for physical therapy evaluation and treatment for deficits from previous R TKA. Pt presents with decreased R knee AROM and muscle weakness compared to LLE also evidenced via 5xSTS and 30 sec STS being below age matched norms indicating increased falls risk. These impairments are limiting pt in completing prolonged travel/driving tasks, ambulating community distances and recreational activity like gardening that require adequate knee AROM for getting to and up from the ground. Pt will benefit from skilled PT services to address  these impairments to decrease falls risk  and improve QoL.  OBJECTIVE IMPAIRMENTS: Abnormal gait, decreased activity tolerance, decreased mobility, difficulty walking, decreased ROM, decreased strength, and pain.   ACTIVITY LIMITATIONS: bending, standing, squatting, and locomotion level  PARTICIPATION LIMITATIONS: driving, community activity, and yard work  PERSONAL FACTORS: Age, Fitness, Past/current experiences, Time since onset of injury/illness/exacerbation, and 3+ comorbidities: Breast cancer, DM, fibromyalgia, GERD, HTN, High cholesterol  are also affecting patient's functional outcome.   REHAB POTENTIAL: Good  CLINICAL DECISION MAKING: Stable/uncomplicated  EVALUATION COMPLEXITY: Low   GOALS: Goals reviewed with patient? No  SHORT TERM GOALS: Target date: 08/21/22  Pt will be independent with HEP to improve muscle strength and R knee AROM to decrease falls risk and improve overall functional mobility with ADL's. Baseline: Given HEP Goal status: INITIAL  LONG TERM GOALS: Target date: 09/18/22  Pt will improve 5xSTS to < 12.6 seconds to demonstrate clinically significant improvement in LE strength for age matched norms.  Baseline: 17 seconds Goal status: INITIAL  2.  Pt will improve self selected 10 meter gait speed to > 1.0 m/s to demonstrate decreased falls risk for limited community ambulation.  Baseline: 0.86 m/s Goal status: INITIAL  3.  Pt will improve FOTO to target score to demonstrate clinically significant improvement in functional mobility.  Baseline: Deferred to next session Goal status: INITIAL  4.  Pt will improve 30 second STS test to 14 reps to match age matched norms to demonstrate improved LE endurance for standing and walking ADL's. Baseline: 11 reps Goal status: INITIAL  PLAN:  PT FREQUENCY: 2x/week  PT DURATION: 8 weeks  PLANNED INTERVENTIONS: Therapeutic exercises, Therapeutic activity, Neuromuscular re-education, Balance training, Gait  training, Patient/Family education, Joint mobilization, Stair training, Electrical stimulation, Cryotherapy, scar mobilization, and Manual therapy  PLAN FOR NEXT SESSION: FOTO, joint mobilization assessment, reassess HEP understanding   Ollen Bowl, PT Physical Therapist- [facility]  07/31/2022, 9:17 AM

## 2022-08-01 NOTE — Therapy (Signed)
OUTPATIENT PHYSICAL THERAPY LOWER EXTREMITY VISIT   Patient Name: Shannon Hancock MRN: [id number] DOB:02-27-1951, 72 y.o., female Today's Date: 08/02/2022  END OF SESSION:  PT End of Session - 08/02/22 0800     Visit Number 2    Number of Visits 17    Date for PT Re-Evaluation 09/18/22    Progress Note Due on Visit 10    PT Start Time 0800    PT Stop Time 4098    PT Time Calculation (min) 43 min    Activity Tolerance Patient tolerated treatment well    Behavior During Therapy [facility] for tasks assessed/performed             Past Medical History:  Diagnosis Date   Allergy    Anxiety    Breast cancer ([place]) 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; ER/PR+, Her2-, 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%   Depression    Diabetes mellitus without complication (HCC)    Diverticulosis    Fibromyalgia    GERD (gastroesophageal reflux disease)    Heart murmur    "no murmur" documented in PCP note by [name], MD 02/09/21   High cholesterol    Hypertension    Joint pain    Past Surgical History:  Procedure Laterality Date   BREAST LUMPECTOMY Right 2016   BREAST LUMPECTOMY WITH RADIOACTIVE SEED AND SENTINEL LYMPH NODE BIOPSY Right 02/13/2015   Procedure: RIGHT BREAST LUMPECTOMY WITH RADIOACTIVE SEED AND SENTINEL LYMPH NODE MAPPING;  Surgeon: [name], MD;  Location: [place];  Service: General;  Laterality: Right;   FOOT SURGERY Left    TOTAL KNEE ARTHROPLASTY Right 03/26/2021   Procedure: TOTAL KNEE ARTHROPLASTY;  Surgeon: [name], MD;  Location: [facility];  Service: Orthopedics;  Laterality: Right;   TUBAL LIGATION     Patient Active Problem List   Diagnosis Date Noted   OA (osteoarthritis) of knee 03/26/2021   Primary osteoarthritis of right knee 03/26/2021   Leukocytosis 04/19/2020   Thyroid cyst 02/26/2018   Plantar fasciitis 02/19/2016   Family history of breast cancer in sister 03/10/2015   Breast cancer of lower-outer quadrant of right female breast ([place]) 02/10/2015    Abdominal bloating 12/16/2013   Edema 11/09/2013   Urge incontinence 05/05/2013   Atrophic vaginitis 05/05/2013   Syncope 02/15/2013   Irritable bowel syndrome 08/20/2012   Hearing loss 11/07/2011   Generalized anxiety disorder 11/07/2011   Peripheral neuropathy 07/05/2011   Osteoporosis 07/05/2011   Tinnitus, bilateral 04/05/2011   HYPERCHOLESTEROLEMIA 04/05/2010   ANEMIA, IRON DEFICIENCY 04/03/2010   DEPRESSION, MAJOR, MODERATE 04/03/2010   Essential hypertension 04/03/2010   ALLERGIC RHINITIS 04/03/2010   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/03/2010   POSTMENOPAUSAL SYNDROME 04/03/2010   DEGENERATIVE JOINT DISEASE, CERVICAL SPINE 04/03/2010   FIBROMYALGIA 04/03/2010    PCP: [name] MD  REFERRING PROVIDER: Sharion Dove, MD  REFERRING DIAG: Z96.651 (ICD-10-CM) - Presence of right artificial knee joint  THERAPY DIAG:  Difficulty in walking, not elsewhere classified  Muscle weakness (generalized)  Abnormality of gait and mobility  Unsteadiness on feet  Status post total right knee replacement  Chronic 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  Other abnormalities of gait and mobility  Other lack of coordination  Rationale for Evaluation and Treatment: Rehabilitation  ONSET DATE: 03/16/2021  SUBJECTIVE:   SUBJECTIVE STATEMENT:  Patient reports ongoing right knee swelling with 3/10 knee pain and states her left hip was bothering her but better now.   PERTINENT HISTORY: Pt is  a 72 y.o. female returning to PT for R TKA. Previously here from Fall of 2022 through Spring of 2023 for similar concern. PMH includes: Breast Cx, DM, fibromyalgia, GERD, HTN, High cholesterol. Pt well known from previous PT encounter. Reports trying [facility], and reports she was unsuccessful in improving her R knee TKE and lacks needed endurance with her walking. Also has swelling and pain with driving for prolonged trips. Reports continuous swelling and pain in her  knee. Has had 1 "light fall" tripping over some clothes but not related to her R knee condition. Able to get up on her own without injury. Her worst pain in her knee up to a 9/10 leading her to tense up. Normal pain levels up to a 6-7/10 with walking tasks and ADL's. Her biggest concern is her lack of AROM in her R knee. Pt's goal is to improve her R knee AROM to where it was, and to improvement in her swelling. Declines fever and cold chills. Has had f/u with [facility] with pt stating MD's had no concerns. Has appt next week.   PAIN:  Are you having pain? Yes: NPRS scale: 0/10 Pain location: R knee, L knee and lower back  Pain description: aching and discomfort Aggravating factors: Walking Relieving factors: rest  PRECAUTIONS: None  WEIGHT BEARING RESTRICTIONS: No  FALLS:  Has patient fallen in last 6 months? Yes. Number of falls 1  LIVING ENVIRONMENT: Lives with: lives with their family Lives in: House/apartment Stairs: Yes: External: 5 steps; on right going up, on left going up, and bilateral but cannot reach both   OCCUPATION: Retired  PLOF: Independent  PATIENT GOALS: Improve R knee AROM, be able to garden, be more active, improve R knee swelling.  NEXT MD VISIT: N/A  OBJECTIVE:   PATIENT SURVEYS:  FOTO Deferred to next session  COGNITION: Overall cognitive status: Within functional limits for tasks assessed     SENSATION: WFL  EDEMA:  R knee with mild warmth compared to LLE.     POSTURE: weight shift left in standing  PALPATION: Mild warmth to R knee compared to LLE. Not painful to palpation. Scar fully healed, good mobility of scar tissue.  LOWER EXTREMITY ROM:  Active ROM Right eval Left eval  Hip flexion    Hip extension    Hip abduction    Hip adduction    Hip internal rotation 25 27  Hip external rotation 23 30  Knee flexion 105 120  Knee extension 10 0  Ankle dorsiflexion    Ankle plantarflexion    Ankle inversion    Ankle eversion      (Blank rows = not tested)  LOWER EXTREMITY MMT:  MMT Right eval Left eval  Hip flexion 4/5 5/5  Hip extension    Hip abduction 4/5 4/5  Hip adduction    Hip internal rotation 3/5 4/5  Hip external rotation 4/5 4/5  Knee flexion 5/5 5/5  Knee extension 5/5 5/5  Ankle dorsiflexion 5/5   Ankle plantarflexion    Ankle inversion    Ankle eversion     (Blank rows = not tested)  LOWER EXTREMITY JOINT MOBILITY  AP/PA TIBIOFEMORAL MOBILIZATIONS: DEFERRED TO NEXT SESSION PATELLAR MOBILIZATIONS: DEFERRED TO NEXT SESSION  FUNCTIONAL TESTS:  5 times sit to stand: 17sec 6 minute walk test: 1,129' 10 meter walk test: 0.86 M/S  30 sec STS: 11 reps GAIT: Distance walked: > 1000' Assistive device utilized: None Level of assistance: Complete Independence Comments: Pt ambulates with  increased weight shift onto LLE during stance phase with B trendelenburg that worsens as pt fatigue. Consistent R toe out during gait.    TODAY'S TREATMENT:                                                                                                                              DATE:   Manual therapy: Pre-treatment AROM left knee flex= 110 deg  -Seated PROM to left knee into flex/ext - Grade 2-3 tibiofemoral A/P joint mobs- x several min (No reported pain with pressure)  - Supine PROM to left knee flex/ext -Supine Grade 2-3 tibiofemoral A/P joint mobs- x several min with knee flexed then extended (propped up) Re-measured AROM left knee flex= 115 deg  Therex:  Review of basic knee AROM/stretching-  -seated knee flex AROM - right knee x 15 reps -Seated knee ext stretch - hold 30 sec x 3 reps    - Detailed education of edema reducing methods including daily elevation when resting.  -Use of ice for inflammation after walking/exercising- Reviewed several ice wraps on internet. -PT performed/instructed patient how to apply a ace wrap for edema control. Patient verbalized understanding.  - Also  discussed potentially purchasing a restorator bike for long term assist in maintaining knee ROM and looked up several options with patient.    PATIENT EDUCATION:  Education details: Prognosis, exam findings, POC, HEP (reps/sets/frequency) Person educated: Patient Education method: Explanation, Demonstration, and Handouts Education comprehension: verbalized understanding and needs further education  HOME EXERCISE PROGRAM: Access Code: [email or web address] URL: [email or web address] Date: 07/24/2022 Prepared by: Larna Daughters  Exercises - Supine Quad Set  - 1 x daily - 7 x weekly - 3 sets - 12 reps - Supine Heel Slide  - 1 x daily - 7 x weekly - 3 sets - 15 reps - Mini Squat with Counter Support  - 1 x daily - 5 x weekly - 3 sets - 8 reps - Active Straight Leg Raise with Quad Set  - 1 x daily - 5 x weekly - 3 sets - 8 reps - Sidelying Hip Abduction  - 1 x daily - 5 x weekly - 3 sets - 8 reps  ASSESSMENT:  CLINICAL IMPRESSION: Treatment focused primarily today on edema reducing technique including detailed conversation on strategies to possibly assist in edema control. She was receptive to ideas (will benefit from review/follow up to see if she decides to use ace wrap w/elevation and ice wrap to better conform around entire knee). She responded well to manual stretching/joint mobs with improved ROM after treatment.   Pt will benefit from continued skilled PT services to address her ongoing edema, painful right knee and limited mobility/weakness to decrease falls risk  and improve QoL.  OBJECTIVE IMPAIRMENTS: Abnormal gait, decreased activity tolerance, decreased mobility, difficulty walking, decreased ROM, decreased strength, and pain.   ACTIVITY LIMITATIONS: bending, standing, squatting, and locomotion level  PARTICIPATION LIMITATIONS: driving, community activity, and yard work  PERSONAL  FACTORS: Age, Fitness, Past/current experiences, Time since onset of injury/illness/exacerbation,  and 3+ comorbidities: Breast cancer, DM, fibromyalgia, GERD, HTN, High cholesterol  are also affecting patient's functional outcome.   REHAB POTENTIAL: Good  CLINICAL DECISION MAKING: Stable/uncomplicated  EVALUATION COMPLEXITY: Low   GOALS: Goals reviewed with patient? No  SHORT TERM GOALS: Target date: 08/21/22  Pt will be independent with HEP to improve muscle strength and R knee AROM to decrease falls risk and improve overall functional mobility with ADL's. Baseline: Given HEP Goal status: INITIAL  LONG TERM GOALS: Target date: 09/18/22  Pt will improve 5xSTS to < 12.6 seconds to demonstrate clinically significant improvement in LE strength for age matched norms.  Baseline: 17 seconds Goal status: INITIAL  2.  Pt will improve self selected 10 meter gait speed to > 1.0 m/s to demonstrate decreased falls risk for limited community ambulation.  Baseline: 0.86 m/s Goal status: INITIAL  3.  Pt will improve FOTO to target score to demonstrate clinically significant improvement in functional mobility.  Baseline: Deferred to next session Goal status: INITIAL  4.  Pt will improve 30 second STS test to 14 reps to match age matched norms to demonstrate improved LE endurance for standing and walking ADL's. Baseline: 11 reps Goal status: INITIAL  PLAN:  PT FREQUENCY: 2x/week  PT DURATION: 8 weeks  PLANNED INTERVENTIONS: Therapeutic exercises, Therapeutic activity, Neuromuscular re-education, Balance training, Gait training, Patient/Family education, Joint mobilization, Stair training, Electrical stimulation, Cryotherapy, scar mobilization, and Manual therapy  PLAN FOR NEXT SESSION: FOTO, joint mobilization assessment, reassess HEP understanding   Salem Caster. Fairly IV, PT, DPT Physical Therapist- [facility]  08/02/2022, 9:52 AM

## 2022-08-02 ENCOUNTER — Ambulatory Visit: Payer: Medicare PPO | Attending: Student

## 2022-08-02 DIAGNOSIS — G8929 Other chronic pain: Secondary | ICD-10-CM | POA: Diagnosis not present

## 2022-08-02 DIAGNOSIS — M25561 Pain in right knee: Secondary | ICD-10-CM | POA: Diagnosis not present

## 2022-08-02 DIAGNOSIS — Z96651 Presence of right artificial knee joint: Secondary | ICD-10-CM | POA: Diagnosis not present

## 2022-08-02 DIAGNOSIS — R2681 Unsteadiness on feet: Secondary | ICD-10-CM | POA: Diagnosis not present

## 2022-08-02 DIAGNOSIS — M6281 Muscle weakness (generalized): Secondary | ICD-10-CM | POA: Diagnosis not present

## 2022-08-02 DIAGNOSIS — R262 Difficulty in walking, not elsewhere classified: Secondary | ICD-10-CM | POA: Diagnosis not present

## 2022-08-02 DIAGNOSIS — R278 Other lack of coordination: Secondary | ICD-10-CM | POA: Insufficient documentation

## 2022-08-02 DIAGNOSIS — R269 Unspecified abnormalities of gait and mobility: Secondary | ICD-10-CM | POA: Insufficient documentation

## 2022-08-02 DIAGNOSIS — R2689 Other abnormalities of gait and mobility: Secondary | ICD-10-CM | POA: Insufficient documentation

## 2022-08-05 ENCOUNTER — Ambulatory Visit: Payer: Medicare PPO

## 2022-08-05 DIAGNOSIS — R2689 Other abnormalities of gait and mobility: Secondary | ICD-10-CM | POA: Diagnosis not present

## 2022-08-05 DIAGNOSIS — R269 Unspecified abnormalities of gait and mobility: Secondary | ICD-10-CM

## 2022-08-05 DIAGNOSIS — R2681 Unsteadiness on feet: Secondary | ICD-10-CM | POA: Diagnosis not present

## 2022-08-05 DIAGNOSIS — M6281 Muscle weakness (generalized): Secondary | ICD-10-CM

## 2022-08-05 DIAGNOSIS — R278 Other lack of coordination: Secondary | ICD-10-CM | POA: Diagnosis not present

## 2022-08-05 DIAGNOSIS — R262 Difficulty in walking, not elsewhere classified: Secondary | ICD-10-CM | POA: Diagnosis not present

## 2022-08-05 DIAGNOSIS — M25561 Pain in right knee: Secondary | ICD-10-CM | POA: Diagnosis not present

## 2022-08-05 DIAGNOSIS — Z96651 Presence of right artificial knee joint: Secondary | ICD-10-CM | POA: Diagnosis not present

## 2022-08-05 DIAGNOSIS — G8929 Other chronic pain: Secondary | ICD-10-CM | POA: Diagnosis not present

## 2022-08-05 NOTE — Therapy (Signed)
OUTPATIENT PHYSICAL THERAPY LOWER EXTREMITY VISIT   Patient Name: Shannon Hancock MRN: [id number] DOB:08/09/1950, 72 y.o., female Today's Date: 08/05/2022  END OF SESSION:  PT End of Session - 08/05/22 0855     Visit Number 3    Number of Visits 17    Date for PT Re-Evaluation 09/18/22    PT Start Time 0845    PT Stop Time 0925    PT Time Calculation (min) 40 min    Activity Tolerance Patient tolerated treatment well;No increased pain;Patient limited by pain    Behavior During Therapy [facility] for tasks assessed/performed             Past Medical History:  Diagnosis Date   Allergy    Anxiety    Breast cancer ([place]) 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; ER/PR+, Her2-, 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%   Depression    Diabetes mellitus without complication (HCC)    Diverticulosis    Fibromyalgia    GERD (gastroesophageal reflux disease)    Heart murmur    "no murmur" documented in PCP note by [name], MD 02/09/21   High cholesterol    Hypertension    Joint pain    Past Surgical History:  Procedure Laterality Date   BREAST LUMPECTOMY Right 2016   BREAST LUMPECTOMY WITH RADIOACTIVE SEED AND SENTINEL LYMPH NODE BIOPSY Right 02/13/2015   Procedure: RIGHT BREAST LUMPECTOMY WITH RADIOACTIVE SEED AND SENTINEL LYMPH NODE MAPPING;  Surgeon: [name], MD;  Location: [place];  Service: General;  Laterality: Right;   FOOT SURGERY Left    TOTAL KNEE ARTHROPLASTY Right 03/26/2021   Procedure: TOTAL KNEE ARTHROPLASTY;  Surgeon: [name], MD;  Location: [facility];  Service: Orthopedics;  Laterality: Right;   TUBAL LIGATION     Patient Active Problem List   Diagnosis Date Noted   OA (osteoarthritis) of knee 03/26/2021   Primary osteoarthritis of right knee 03/26/2021   Leukocytosis 04/19/2020   Thyroid cyst 02/26/2018   Plantar fasciitis 02/19/2016   Family history of breast cancer in sister 03/10/2015   Breast cancer of lower-outer quadrant of right female breast ([place])  02/10/2015   Abdominal bloating 12/16/2013   Edema 11/09/2013   Urge incontinence 05/05/2013   Atrophic vaginitis 05/05/2013   Syncope 02/15/2013   Irritable bowel syndrome 08/20/2012   Hearing loss 11/07/2011   Generalized anxiety disorder 11/07/2011   Peripheral neuropathy 07/05/2011   Osteoporosis 07/05/2011   Tinnitus, bilateral 04/05/2011   HYPERCHOLESTEROLEMIA 04/05/2010   ANEMIA, IRON DEFICIENCY 04/03/2010   DEPRESSION, MAJOR, MODERATE 04/03/2010   Essential hypertension 04/03/2010   ALLERGIC RHINITIS 04/03/2010   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/03/2010   POSTMENOPAUSAL SYNDROME 04/03/2010   DEGENERATIVE JOINT DISEASE, CERVICAL SPINE 04/03/2010   FIBROMYALGIA 04/03/2010    PCP: [name] MD  REFERRING PROVIDER: Sharion Dove, MD  REFERRING DIAG: Z96.651 (ICD-10-CM) - Presence of right artificial knee joint  THERAPY DIAG:  Difficulty in walking, not elsewhere classified  Muscle weakness (generalized)  Abnormality of gait and mobility  Rationale for Evaluation and Treatment: Rehabilitation  ONSET DATE: 03/16/2021  SUBJECTIVE:   SUBJECTIVE STATEMENT:  Pt reports doing ok today. Pain ISQ. HEP performed without any issues.   PERTINENT HISTORY: Pt is a 72 y.o. female returning to PT for R TKA. Previously here from Fall of 2022 through Spring of 2023 for similar concern. PMH includes: Breast Cx, DM, fibromyalgia, GERD, HTN, High cholesterol. Pt well known from previous PT encounter. Reports trying [facility], and reports she  was unsuccessful in improving her R knee TKE and lacks needed endurance with her walking. Also has swelling and pain with driving for prolonged trips. Reports continuous swelling and pain in her knee. Has had 1 "light fall" tripping over some clothes but not related to her R knee condition. Able to get up on her own without injury. Her worst pain in her knee up to a 9/10 leading her to tense up. Normal pain levels up to a 6-7/10 with  walking tasks and ADL's. Her biggest concern is her lack of AROM in her R knee. Pt's goal is to improve her R knee AROM to where it was, and to improvement in her swelling. Declines fever and cold chills. Has had f/u with [facility] with pt stating MD's had no concerns. Has appt next week.   PAIN:  Are you having pain? 6/10 Rt knee   PRECAUTIONS: None  WEIGHT BEARING RESTRICTIONS: No  FALLS:  Has patient fallen in last 6 months? Yes. Number of falls 1   PLOF: Independent  PATIENT GOALS: Improve R knee AROM, be able to garden, be more active, improve R knee swelling.  NEXT MD VISIT: N/A  OBJECTIVE:    INTERVENTION 08/05/22  FOTO: 33/100  -AA/ROM NuStep x 10 minutes, progressive increase in forward seat position to increase knee flexion angle -Seated Rt knee flexion stretch 1x60sec (using CL limb to obtain stretch)  -seated RLE LAQ 1x10  -Seated Rt knee flexion stretch 1x60sec (using CL limb to obtain stretch)  -seated RLE LAQ 1x10  -Seated Rt knee flexion stretch 1x60sec (using CL limb to obtain stretch)  -seated RLE LAQ 1x10  -Seated Rt knee flexion stretch 1x60sec (using CL limb to obtain stretch)  -seated RLE LAQ 1x10  -standing Rt hamstrings curl 1x12 @ 2.5lb, BUE support -Seated Rt knee flexion stretch 1x60sec (using CL limb to obtain stretch)  -standing Rt hamstrings curl 1x12 @ 2.5lb, BUE support   End of session knee flexion 118 degrees   PATIENT EDUCATION:  Education details: Prognosis, exam findings, POC, HEP (reps/sets/frequency) Person educated: Patient Education method: Explanation, Demonstration, and Handouts Education comprehension: verbalized understanding and needs further education  HOME EXERCISE PROGRAM: Access Code: [email or web address] URL: [email or web address] Date: 07/24/2022 Prepared by: Larna Daughters  Exercises - Supine Quad Set  - 1 x daily - 7 x weekly - 3 sets - 12 reps - Supine Heel Slide  - 1 x daily - 7 x weekly - 3 sets - 15  reps - Mini Squat with Counter Support  - 1 x daily - 5 x weekly - 3 sets - 8 reps - Active Straight Leg Raise with Quad Set  - 1 x daily - 5 x weekly - 3 sets - 8 reps - Sidelying Hip Abduction  - 1 x daily - 5 x weekly - 3 sets - 8 reps  ASSESSMENT:  CLINICAL IMPRESSION: NuStep warmup for AA/ROM, then alternate LLLD stretches into flexion and active knee extension. All tolerated without much exacerbation of pain. ROM progression this date far beyond last 2 visits.  Pt will benefit from continued skilled PT services to address her ongoing edema, painful right knee and limited mobility/weakness to decrease falls risk  and improve QoL.  OBJECTIVE IMPAIRMENTS: Abnormal gait, decreased activity tolerance, decreased mobility, difficulty walking, decreased ROM, decreased strength, and pain.   ACTIVITY LIMITATIONS: bending, standing, squatting, and locomotion level  PARTICIPATION LIMITATIONS: driving, community activity, and yard work  PERSONAL FACTORS: Age, Fitness, Past/current experiences, Time since onset  of injury/illness/exacerbation, and 3+ comorbidities: Breast cancer, DM, fibromyalgia, GERD, HTN, High cholesterol  are also affecting patient's functional outcome.   REHAB POTENTIAL: Good  CLINICAL DECISION MAKING: Stable/uncomplicated  EVALUATION COMPLEXITY: Low   GOALS: Goals reviewed with patient? No  SHORT TERM GOALS: Target date: 08/21/22  Pt will be independent with HEP to improve muscle strength and R knee AROM to decrease falls risk and improve overall functional mobility with ADL's. Baseline: Given HEP Goal status: INITIAL  LONG TERM GOALS: Target date: 09/18/22  Pt will improve 5xSTS to < 12.6 seconds to demonstrate clinically significant improvement in LE strength for age matched norms.  Baseline: 17 seconds Goal status: INITIAL  2.  Pt will improve self selected 10 meter gait speed to > 1.0 m/s to demonstrate decreased falls risk for limited community ambulation.   Baseline: 0.86 m/s Goal status: INITIAL  3.  Pt will improve FOTO to target score to demonstrate clinically significant improvement in functional mobility.  Baseline: 08/05/22: 33 Goal status: INITIAL  4.  Pt will improve 30 second STS test to 14 reps to match age matched norms to demonstrate improved LE endurance for standing and walking ADL's. Baseline: 11 reps Goal status: INITIAL  PLAN:  PT FREQUENCY: 2x/week  PT DURATION: 8 weeks  PLANNED INTERVENTIONS: Therapeutic exercises, Therapeutic activity, Neuromuscular re-education, Balance training, Gait training, Patient/Family education, Joint mobilization, Stair training, Electrical stimulation, Cryotherapy, scar mobilization, and Manual therapy  PLAN FOR NEXT SESSION: joint mobilization assessment, reassess HEP understanding   8:56 AM, 08/05/22 Etta Grandchild, PT, DPT Physical Therapist - [facility]  [phone] ([place])    08/05/2022, 8:56 AM

## 2022-08-08 ENCOUNTER — Ambulatory Visit: Payer: Medicare PPO

## 2022-08-08 DIAGNOSIS — R2681 Unsteadiness on feet: Secondary | ICD-10-CM

## 2022-08-08 DIAGNOSIS — M25561 Pain in right knee: Secondary | ICD-10-CM | POA: Diagnosis not present

## 2022-08-08 DIAGNOSIS — M6281 Muscle weakness (generalized): Secondary | ICD-10-CM

## 2022-08-08 DIAGNOSIS — R269 Unspecified abnormalities of gait and mobility: Secondary | ICD-10-CM

## 2022-08-08 DIAGNOSIS — R2689 Other abnormalities of gait and mobility: Secondary | ICD-10-CM | POA: Diagnosis not present

## 2022-08-08 DIAGNOSIS — R262 Difficulty in walking, not elsewhere classified: Secondary | ICD-10-CM

## 2022-08-08 DIAGNOSIS — R278 Other lack of coordination: Secondary | ICD-10-CM | POA: Diagnosis not present

## 2022-08-08 DIAGNOSIS — Z96651 Presence of right artificial knee joint: Secondary | ICD-10-CM

## 2022-08-08 DIAGNOSIS — G8929 Other chronic pain: Secondary | ICD-10-CM | POA: Diagnosis not present

## 2022-08-08 NOTE — Therapy (Signed)
OUTPATIENT PHYSICAL THERAPY LOWER EXTREMITY VISIT   Patient Name: Shannon Hancock MRN: [id number] DOB:17-Jul-1950, 72 y.o., female Today's Date: 08/08/2022  END OF SESSION:  PT End of Session - 08/08/22 0844     Visit Number 4    Number of Visits 17    Date for PT Re-Evaluation 09/18/22    PT Start Time 0845    PT Stop Time 0926    PT Time Calculation (min) 41 min    Activity Tolerance Patient tolerated treatment well;No increased pain;Patient limited by pain    Behavior During Therapy [facility] for tasks assessed/performed              Past Medical History:  Diagnosis Date   Allergy    Anxiety    Breast cancer ([place]) 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; ER/PR+, Her2-, 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%   Depression    Diabetes mellitus without complication (HCC)    Diverticulosis    Fibromyalgia    GERD (gastroesophageal reflux disease)    Heart murmur    "no murmur" documented in PCP note by [name], MD 02/09/21   High cholesterol    Hypertension    Joint pain    Past Surgical History:  Procedure Laterality Date   BREAST LUMPECTOMY Right 2016   BREAST LUMPECTOMY WITH RADIOACTIVE SEED AND SENTINEL LYMPH NODE BIOPSY Right 02/13/2015   Procedure: RIGHT BREAST LUMPECTOMY WITH RADIOACTIVE SEED AND SENTINEL LYMPH NODE MAPPING;  Surgeon: [name], MD;  Location: [place];  Service: General;  Laterality: Right;   FOOT SURGERY Left    TOTAL KNEE ARTHROPLASTY Right 03/26/2021   Procedure: TOTAL KNEE ARTHROPLASTY;  Surgeon: [name], MD;  Location: [facility];  Service: Orthopedics;  Laterality: Right;   TUBAL LIGATION     Patient Active Problem List   Diagnosis Date Noted   OA (osteoarthritis) of knee 03/26/2021   Primary osteoarthritis of right knee 03/26/2021   Leukocytosis 04/19/2020   Thyroid cyst 02/26/2018   Plantar fasciitis 02/19/2016   Family history of breast cancer in sister 03/10/2015   Breast cancer of lower-outer quadrant of right female breast ([place])  02/10/2015   Abdominal bloating 12/16/2013   Edema 11/09/2013   Urge incontinence 05/05/2013   Atrophic vaginitis 05/05/2013   Syncope 02/15/2013   Irritable bowel syndrome 08/20/2012   Hearing loss 11/07/2011   Generalized anxiety disorder 11/07/2011   Peripheral neuropathy 07/05/2011   Osteoporosis 07/05/2011   Tinnitus, bilateral 04/05/2011   HYPERCHOLESTEROLEMIA 04/05/2010   ANEMIA, IRON DEFICIENCY 04/03/2010   DEPRESSION, MAJOR, MODERATE 04/03/2010   Essential hypertension 04/03/2010   ALLERGIC RHINITIS 04/03/2010   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/03/2010   POSTMENOPAUSAL SYNDROME 04/03/2010   DEGENERATIVE JOINT DISEASE, CERVICAL SPINE 04/03/2010   FIBROMYALGIA 04/03/2010    PCP: [name] MD  REFERRING PROVIDER: Sharion Dove, MD  REFERRING DIAG: Z96.651 (ICD-10-CM) - Presence of right artificial knee joint  THERAPY DIAG:  Difficulty in walking, not elsewhere classified  Muscle weakness (generalized)  Abnormality of gait and mobility  Unsteadiness on feet  Status post total right knee replacement  Rationale for Evaluation and Treatment: Rehabilitation  ONSET DATE: 03/16/2021  SUBJECTIVE:   SUBJECTIVE STATEMENT: Patient reports her swelling is better. Has been compliant with HEP.   PERTINENT HISTORY: Pt is a 72 y.o. female returning to PT for R TKA. Previously here from Fall of 2022 through Spring of 2023 for similar concern. PMH includes: Breast Cx, DM, fibromyalgia, GERD, HTN, High cholesterol. Pt well known from  previous PT encounter. Reports trying [facility], and reports she was unsuccessful in improving her R knee TKE and lacks needed endurance with her walking. Also has swelling and pain with driving for prolonged trips. Reports continuous swelling and pain in her knee. Has had 1 "light fall" tripping over some clothes but not related to her R knee condition. Able to get up on her own without injury. Her worst pain in her knee up to a 9/10  leading her to tense up. Normal pain levels up to a 6-7/10 with walking tasks and ADL's. Her biggest concern is her lack of AROM in her R knee. Pt's goal is to improve her R knee AROM to where it was, and to improvement in her swelling. Declines fever and cold chills. Has had f/u with [facility] with pt stating MD's had no concerns. Has appt next week.   PAIN:  Are you having pain? 6/10 Rt knee   PRECAUTIONS: None  WEIGHT BEARING RESTRICTIONS: No  FALLS:  Has patient fallen in last 6 months? Yes. Number of falls 1   PLOF: Independent  PATIENT GOALS: Improve R knee AROM, be able to garden, be more active, improve R knee swelling.  NEXT MD VISIT: N/A  OBJECTIVE:    INTERVENTION 08/08/22 ROM at beginning of session: 116 ROM at end of session: 120  Treatment:  Nustep seat position 8; 4 minutes for increasing knee flexibility and muscle contraction  Supine: Contract relax increasing knee flexion against PT resistance 10x - Grade 2-3 tibiofemoral A/P joint mobs- x several min (No reported pain with pressure)  - Supine PROM to left knee flex/ext  Seated: Seated LAQ 10x ; x 2 sets Contract relax resisted against PT 10x 3 sets with increasing flexion  Standing: Standing hamstring curl 10x Stair knee flexion stretch 60 seconds x 2  Ice applied to knee x 3 minutes    PATIENT EDUCATION:  Education details: Prognosis, exam findings, POC, HEP (reps/sets/frequency) Person educated: Patient Education method: Explanation, Demonstration, and Handouts Education comprehension: verbalized understanding and needs further education  HOME EXERCISE PROGRAM: Access Code: [email or web address] URL: [email or web address] Date: 07/24/2022 Prepared by: Larna Daughters  Exercises - Supine Quad Set  - 1 x daily - 7 x weekly - 3 sets - 12 reps - Supine Heel Slide  - 1 x daily - 7 x weekly - 3 sets - 15 reps - Mini Squat with Counter Support  - 1 x daily - 5 x weekly - 3 sets - 8 reps -  Active Straight Leg Raise with Quad Set  - 1 x daily - 5 x weekly - 3 sets - 8 reps - Sidelying Hip Abduction  - 1 x daily - 5 x weekly - 3 sets - 8 reps  ASSESSMENT:  CLINICAL IMPRESSION: Patient presents with excellent motivation. She initially started with 116 degrees of active ROM which improved to 120 by end of session. Decreased stiffness and heat noted by end of session.   Patient educated on using ice between sessions.Pt will benefit from continued skilled PT services to address her ongoing edema, painful right knee and limited mobility/weakness to decrease falls risk  and improve QoL.  OBJECTIVE IMPAIRMENTS: Abnormal gait, decreased activity tolerance, decreased mobility, difficulty walking, decreased ROM, decreased strength, and pain.   ACTIVITY LIMITATIONS: bending, standing, squatting, and locomotion level  PARTICIPATION LIMITATIONS: driving, community activity, and yard work  PERSONAL FACTORS: Age, Fitness, Past/current experiences, Time since onset of injury/illness/exacerbation, and 3+ comorbidities: Breast cancer, DM, fibromyalgia,  GERD, HTN, High cholesterol  are also affecting patient's functional outcome.   REHAB POTENTIAL: Good  CLINICAL DECISION MAKING: Stable/uncomplicated  EVALUATION COMPLEXITY: Low   GOALS: Goals reviewed with patient? No  SHORT TERM GOALS: Target date: 08/21/22  Pt will be independent with HEP to improve muscle strength and R knee AROM to decrease falls risk and improve overall functional mobility with ADL's. Baseline: Given HEP Goal status: INITIAL  LONG TERM GOALS: Target date: 09/18/22  Pt will improve 5xSTS to < 12.6 seconds to demonstrate clinically significant improvement in LE strength for age matched norms.  Baseline: 17 seconds Goal status: INITIAL  2.  Pt will improve self selected 10 meter gait speed to > 1.0 m/s to demonstrate decreased falls risk for limited community ambulation.  Baseline: 0.86 m/s Goal status:  INITIAL  3.  Pt will improve FOTO to target score to demonstrate clinically significant improvement in functional mobility.  Baseline: 08/05/22: 33 Goal status: INITIAL  4.  Pt will improve 30 second STS test to 14 reps to match age matched norms to demonstrate improved LE endurance for standing and walking ADL's. Baseline: 11 reps Goal status: INITIAL  PLAN:  PT FREQUENCY: 2x/week  PT DURATION: 8 weeks  PLANNED INTERVENTIONS: Therapeutic exercises, Therapeutic activity, Neuromuscular re-education, Balance training, Gait training, Patient/Family education, Joint mobilization, Stair training, Electrical stimulation, Cryotherapy, scar mobilization, and Manual therapy  PLAN FOR NEXT SESSION: joint mobilization assessment, reassess HEP understanding    Janna Arch, PT, DPT Physical Therapist - [place] [phone]    08/08/2022, 9:30 AM

## 2022-08-12 ENCOUNTER — Ambulatory Visit: Payer: Medicare PPO

## 2022-08-12 DIAGNOSIS — R278 Other lack of coordination: Secondary | ICD-10-CM | POA: Diagnosis not present

## 2022-08-12 DIAGNOSIS — Z96651 Presence of right artificial knee joint: Secondary | ICD-10-CM

## 2022-08-12 DIAGNOSIS — R262 Difficulty in walking, not elsewhere classified: Secondary | ICD-10-CM | POA: Diagnosis not present

## 2022-08-12 DIAGNOSIS — G8929 Other chronic pain: Secondary | ICD-10-CM

## 2022-08-12 DIAGNOSIS — M6281 Muscle weakness (generalized): Secondary | ICD-10-CM

## 2022-08-12 DIAGNOSIS — R269 Unspecified abnormalities of gait and mobility: Secondary | ICD-10-CM | POA: Diagnosis not present

## 2022-08-12 DIAGNOSIS — R2689 Other abnormalities of gait and mobility: Secondary | ICD-10-CM

## 2022-08-12 DIAGNOSIS — R2681 Unsteadiness on feet: Secondary | ICD-10-CM | POA: Diagnosis not present

## 2022-08-12 DIAGNOSIS — M25561 Pain in right knee: Secondary | ICD-10-CM | POA: Diagnosis not present

## 2022-08-12 NOTE — Therapy (Addendum)
OUTPATIENT PHYSICAL THERAPY LOWER EXTREMITY VISIT   Patient Name: Shannon Hancock MRN: [id number] DOB:[birth year], 73 y.o., female Today's Date: 08/12/2022  END OF SESSION:  PT End of Session - 08/12/22 0847     Visit Number 5    Number of Visits 17    Date for PT Re-Evaluation 09/18/22    PT Start Time 0847    PT Stop Time 0933    PT Time Calculation (min) 46 min    Activity Tolerance Patient tolerated treatment well;No increased pain;Patient limited by pain    Behavior During Therapy [facility] for tasks assessed/performed               Past Medical History:  Diagnosis Date   Allergy    Anxiety    Breast cancer ([place]) 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; ER/PR+, Her2-, 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%   Depression    Diabetes mellitus without complication (HCC)    Diverticulosis    Fibromyalgia    GERD (gastroesophageal reflux disease)    Heart murmur    "no murmur" documented in PCP note by [name], MD 02/09/21   High cholesterol    Hypertension    Joint pain    Past Surgical History:  Procedure Laterality Date   BREAST LUMPECTOMY Right 2016   BREAST LUMPECTOMY WITH RADIOACTIVE SEED AND SENTINEL LYMPH NODE BIOPSY Right 02/13/2015   Procedure: RIGHT BREAST LUMPECTOMY WITH RADIOACTIVE SEED AND SENTINEL LYMPH NODE MAPPING;  Surgeon: [name], MD;  Location: [place];  Service: General;  Laterality: Right;   FOOT SURGERY Left    TOTAL KNEE ARTHROPLASTY Right 03/26/2021   Procedure: TOTAL KNEE ARTHROPLASTY;  Surgeon: [name], MD;  Location: [facility];  Service: Orthopedics;  Laterality: Right;   TUBAL LIGATION     Patient Active Problem List   Diagnosis Date Noted   OA (osteoarthritis) of knee 03/26/2021   Primary osteoarthritis of right knee 03/26/2021   Leukocytosis 04/19/2020   Thyroid cyst 02/26/2018   Plantar fasciitis 02/19/2016   Family history of breast cancer in sister 03/10/2015   Breast cancer of lower-outer quadrant of right female breast ([place])  02/10/2015   Abdominal bloating 12/16/2013   Edema 11/09/2013   Urge incontinence 05/05/2013   Atrophic vaginitis 05/05/2013   Syncope 02/15/2013   Irritable bowel syndrome 08/20/2012   Hearing loss 11/07/2011   Generalized anxiety disorder 11/07/2011   Peripheral neuropathy 07/05/2011   Osteoporosis 07/05/2011   Tinnitus, bilateral 04/05/2011   HYPERCHOLESTEROLEMIA 04/05/2010   ANEMIA, IRON DEFICIENCY 04/03/2010   DEPRESSION, MAJOR, MODERATE 04/03/2010   Essential hypertension 04/03/2010   ALLERGIC RHINITIS 04/03/2010   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/03/2010   POSTMENOPAUSAL SYNDROME 04/03/2010   DEGENERATIVE JOINT DISEASE, CERVICAL SPINE 04/03/2010   FIBROMYALGIA 04/03/2010    PCP: [name] MD  REFERRING PROVIDER: Sharion Dove, MD  REFERRING DIAG: [phone] (ICD-10-CM) - Presence of right artificial knee joint  THERAPY DIAG:  Difficulty in walking, not elsewhere classified  Muscle weakness (generalized)  Abnormality of gait and mobility  Unsteadiness on feet  Status post total right knee replacement  Chronic 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  Other abnormalities of gait and mobility  Other lack of coordination  Status post right knee replacement  Total knee replacement status, right  Chronic pain of right knee  Rationale for Evaluation and Treatment: Rehabilitation  ONSET DATE: 03/16/2021  SUBJECTIVE:   SUBJECTIVE STATEMENT:  Pt went to visit her brother down in [place] and  had to drive to do so.  Pt notes that her knee is still a little stiff from the drive.  Pt not able to be as compliant with HEP due to being away.  PERTINENT HISTORY: Pt is a 72 y.o. female returning to PT for R TKA. Previously here from Fall of 2022 through Spring of 2023 for similar concern. PMH includes: Breast Cx, DM, fibromyalgia, GERD, HTN, High cholesterol. Pt well known from previous PT encounter. Reports trying [facility], and reports  she was unsuccessful in improving her R knee TKE and lacks needed endurance with her walking. Also has swelling and pain with driving for prolonged trips. Reports continuous swelling and pain in her knee. Has had 1 "light fall" tripping over some clothes but not related to her R knee condition. Able to get up on her own without injury. Her worst pain in her knee up to a 9/10 leading her to tense up. Normal pain levels up to a 6-7/10 with walking tasks and ADL's. Her biggest concern is her lack of AROM in her R knee. Pt's goal is to improve her R knee AROM to where it was, and to improvement in her swelling. Declines fever and cold chills. Has had f/u with [facility] with pt stating MD's had no concerns. Has appt next week.   PAIN:  Are you having pain? 4/10 Rt knee   PRECAUTIONS: None  WEIGHT BEARING RESTRICTIONS: No  FALLS:  Has patient fallen in last 6 months? Yes. Number of falls 1   PLOF: Independent  PATIENT GOALS: Improve R knee AROM, be able to garden, be more active, improve R knee swelling.  NEXT MD VISIT: N/A  OBJECTIVE:   INTERVENTION 08/12/22  ROM at beginning of session: 99 deg ROM at end of session: 114 deg  Treatment:  Nustep seat position 8, handles 7; Lvl 1 4 minutes for increasing knee flexibility and muscle contraction  Supine: Contract relax increasing knee flexion against PT resistance 10x Grade 2-3 tibiofemoral A/P joint mobs- x several min (No reported pain with pressure)  Supine low load long duration pressure applied by therapist to the knee to increase terminal knee extension, several minutes Supine PROM to left knee flex/ext Quad set with bolster placed under heel, x5 SLR with quad set prior to lift off, 2x10  Seated: Seated LAQ 10x ; x 2 sets Contract relax resisted against PT 10x 3 sets with increasing flexion  Standing: Standing hamstring curl 10x Stair knee flexion stretch 60 seconds x 2  Ice applied to knee x 3 minutes    PATIENT  EDUCATION:  Education details: Prognosis, exam findings, POC, HEP (reps/sets/frequency) Person educated: Patient Education method: Explanation, Demonstration, and Handouts Education comprehension: verbalized understanding and needs further education  HOME EXERCISE PROGRAM: Access Code: [email or web address] URL: [email or web address] Date: 07/24/2022 Prepared by: Larna Daughters  Exercises - Supine Quad Set  - 1 x daily - 7 x weekly - 3 sets - 12 reps - Supine Heel Slide  - 1 x daily - 7 x weekly - 3 sets - 15 reps - Mini Squat with Counter Support  - 1 x daily - 5 x weekly - 3 sets - 8 reps - Active Straight Leg Raise with Quad Set  - 1 x daily - 5 x weekly - 3 sets - 8 reps - Sidelying Hip Abduction  - 1 x daily - 5 x weekly - 3 sets - 8 reps  ASSESSMENT:  CLINICAL IMPRESSION:  Pt responded well to the  exercises and noted an increase in ROM from beginning of the session to the end.  Pt also introduced to SLR and QS's to increase terminal knee extension.  Pt notes that following the low load long duration, she was experiencing increased pain as expected due to the LE not used to being in that position.  Pt still needs strengthening within the ROM that she possess and will continue as therapy progresses.   Pt will continue to benefit from skilled therapy to address remaining deficits in order to improve overall QoL and return to PLOF.      OBJECTIVE IMPAIRMENTS: Abnormal gait, decreased activity tolerance, decreased mobility, difficulty walking, decreased ROM, decreased strength, and pain.   ACTIVITY LIMITATIONS: bending, standing, squatting, and locomotion level  PARTICIPATION LIMITATIONS: driving, community activity, and yard work  PERSONAL FACTORS: Age, Fitness, Past/current experiences, Time since onset of injury/illness/exacerbation, and 3+ comorbidities: Breast cancer, DM, fibromyalgia, GERD, HTN, High cholesterol  are also affecting patient's functional outcome.   REHAB  POTENTIAL: Good  CLINICAL DECISION MAKING: Stable/uncomplicated  EVALUATION COMPLEXITY: Low   GOALS: Goals reviewed with patient? No  SHORT TERM GOALS: Target date: 08/21/22  Pt will be independent with HEP to improve muscle strength and R knee AROM to decrease falls risk and improve overall functional mobility with ADL's. Baseline: Given HEP Goal status: INITIAL  LONG TERM GOALS: Target date: 09/18/22  Pt will improve 5xSTS to < 12.6 seconds to demonstrate clinically significant improvement in LE strength for age matched norms.  Baseline: 17 seconds Goal status: INITIAL  2.  Pt will improve self selected 10 meter gait speed to > 1.0 m/s to demonstrate decreased falls risk for limited community ambulation.  Baseline: 0.86 m/s Goal status: INITIAL  3.  Pt will improve FOTO to target score to demonstrate clinically significant improvement in functional mobility.  Baseline: 08/05/22: 33 Goal status: INITIAL  4.  Pt will improve 30 second STS test to 14 reps to match age matched norms to demonstrate improved LE endurance for standing and walking ADL's. Baseline: 11 reps Goal status: INITIAL  PLAN:  PT FREQUENCY: 2x/week  PT DURATION: 8 weeks  PLANNED INTERVENTIONS: Therapeutic exercises, Therapeutic activity, Neuromuscular re-education, Balance training, Gait training, Patient/Family education, Joint mobilization, Stair training, Electrical stimulation, Cryotherapy, scar mobilization, and Manual therapy  PLAN FOR NEXT SESSION:   joint mobilization assessment, reassess HEP understanding    Gwenlyn Saran, PT, DPT Physical Therapist - [facility]  08/12/22, 9:35 AM

## 2022-08-15 ENCOUNTER — Ambulatory Visit: Payer: Medicare PPO

## 2022-08-15 DIAGNOSIS — R262 Difficulty in walking, not elsewhere classified: Secondary | ICD-10-CM

## 2022-08-15 DIAGNOSIS — G8929 Other chronic pain: Secondary | ICD-10-CM | POA: Diagnosis not present

## 2022-08-15 DIAGNOSIS — M25561 Pain in right knee: Secondary | ICD-10-CM | POA: Diagnosis not present

## 2022-08-15 DIAGNOSIS — M6281 Muscle weakness (generalized): Secondary | ICD-10-CM

## 2022-08-15 DIAGNOSIS — R2689 Other abnormalities of gait and mobility: Secondary | ICD-10-CM | POA: Diagnosis not present

## 2022-08-15 DIAGNOSIS — R269 Unspecified abnormalities of gait and mobility: Secondary | ICD-10-CM | POA: Diagnosis not present

## 2022-08-15 DIAGNOSIS — R2681 Unsteadiness on feet: Secondary | ICD-10-CM

## 2022-08-15 DIAGNOSIS — R278 Other lack of coordination: Secondary | ICD-10-CM

## 2022-08-15 DIAGNOSIS — Z96651 Presence of right artificial knee joint: Secondary | ICD-10-CM | POA: Diagnosis not present

## 2022-08-15 NOTE — Therapy (Addendum)
OUTPATIENT PHYSICAL THERAPY LOWER EXTREMITY VISIT   Patient Name: Shannon Hancock MRN: [id number] DOB:[birth year], 72 y.o., female Today's Date: 08/16/2022  END OF SESSION:  PT End of Session - 08/15/22 0850     Visit Number 6    Number of Visits 17    Date for PT Re-Evaluation 09/18/22    Progress Note Due on Visit 10    PT Start Time 0845    PT Stop Time 0929    PT Time Calculation (min) 44 min    Activity Tolerance Patient tolerated treatment well;No increased pain    Behavior During Therapy WFL for tasks assessed/performed               Past Medical History:  Diagnosis Date   Allergy    Anxiety    Breast cancer (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%   Depression    Diabetes mellitus without complication ([place])    Diverticulosis    Fibromyalgia    GERD (gastroesophageal reflux disease)    Heart murmur    "no murmur" documented in PCP note by [name], MD 02/09/21   High cholesterol    Hypertension    Joint pain    Past Surgical History:  Procedure Laterality Date   BREAST LUMPECTOMY Right 2016   BREAST LUMPECTOMY WITH RADIOACTIVE SEED AND SENTINEL LYMPH NODE BIOPSY Right 02/13/2015   Procedure: RIGHT BREAST LUMPECTOMY WITH RADIOACTIVE SEED AND SENTINEL LYMPH NODE MAPPING;  Surgeon: [name], MD;  Location: [place];  Service: General;  Laterality: Right;   FOOT SURGERY Left    TOTAL KNEE ARTHROPLASTY Right 03/26/2021   Procedure: TOTAL KNEE ARTHROPLASTY;  Surgeon: [name], MD;  Location[facility];  Service: Orthopedics;  Laterality: Right;   TUBAL LIGATION     Patient Active Problem List   Diagnosis Date Noted   OA (osteoarthritis) of knee 03/26/2021   Primary osteoarthritis of right knee 03/26/2021   Leukocytosis 04/19/2020   Thyroid cyst 02/26/2018   Plantar fasciitis 02/19/2016   Family history of breast cancer in sister 03/10/2015   Breast cancer of lower-outer quadrant of right female  breast ([place]) 02/10/2015   Abdominal bloating 12/16/2013   Edema 11/09/2013   Urge incontinence 05/05/2013   Atrophic vaginitis 05/05/2013   Syncope 02/15/2013   Irritable bowel syndrome 08/20/2012   Hearing loss 11/07/2011   Generalized anxiety disorder 11/07/2011   Peripheral neuropathy 07/05/2011   Osteoporosis 07/05/2011   Tinnitus, bilateral 04/05/2011   HYPERCHOLESTEROLEMIA 04/05/2010   ANEMIA, IRON DEFICIENCY 04/03/2010   DEPRESSION, MAJOR, MODERATE 04/03/2010   Essential hypertension 04/03/2010   ALLERGIC RHINITIS 04/03/2010   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/03/2010   POSTMENOPAUSAL SYNDROME 04/03/2010   DEGENERATIVE JOINT DISEASE, CERVICAL SPINE 04/03/2010   FIBROMYALGIA 04/03/2010    PCP: [name] MD  REFERRING PROVIDER: Sharion Dove, MD  REFERRING DIAG: Z96.651 (ICD-10-CM) - Presence of right artificial knee joint  THERAPY DIAG:  Difficulty in walking, not elsewhere classified  Muscle weakness (generalized)  Abnormality of gait and mobility  Unsteadiness on feet  Status post total right knee replacement  Chronic 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  Other abnormalities of gait and mobility  Other lack of coordination  Status post right knee replacement  Rationale for Evaluation and Treatment: Rehabilitation  ONSET DATE: 03/16/2021  SUBJECTIVE:   SUBJECTIVE STATEMENT:  Pt reports being stiff today and did report some soreness after last visit.   Marland Kitchen  PERTINENT HISTORY: Pt is a 71 y.o. female returning to PT for R TKA. Previously here from Fall of 2022 through Spring of 2023 for similar concern. PMH includes: Breast Cx, DM, fibromyalgia, GERD, HTN, High cholesterol. Pt well known from previous PT encounter. Reports trying [facility], and reports she was unsuccessful in improving her R knee TKE and lacks needed endurance with her walking. Also has swelling and pain with driving for prolonged trips. Reports continuous  swelling and pain in her knee. Has had 1 "light fall" tripping over some clothes but not related to her R knee condition. Able to get up on her own without injury. Her worst pain in her knee up to a 9/10 leading her to tense up. Normal pain levels up to a 6-7/10 with walking tasks and ADL's. Her biggest concern is her lack of AROM in her R knee. Pt's goal is to improve her R knee AROM to where it was, and to improvement in her swelling. Declines fever and cold chills. Has had f/u with [facility] with pt stating MD's had no concerns. Has appt next week.   PAIN:  Are you having pain? 4/10 Rt knee   PRECAUTIONS: None  WEIGHT BEARING RESTRICTIONS: No  FALLS:  Has patient fallen in last 6 months? Yes. Number of falls 1   PLOF: Independent  PATIENT GOALS: Improve R knee AROM, be able to garden, be more active, improve R knee swelling.  NEXT MD VISIT: N/A  OBJECTIVE:   INTERVENTION 08/16/22  ROM at beginning of session: 103 deg ROM at end of session: 116 deg  Treatment:  Nustep seat position 7, LE only; Lvl 1 for 2 min; lvl 4 for 1 min  4 minutes for increasing knee flexibility and muscle contraction      Seated: Seated LAQ 10x ; x 2 sets Contract relax resisted against PT 10x 3 sets with increasing flexion  Standing: Stair knee flexion stretch 60 seconds x 2 Standing step up onto 6" block x 12 reps each LE Step up and opp LE march x 12 reps each LE Sit to stand with left foot on 2" block x 6.  (No reported pain)  Sit to stand with left foot on green theradisc x 6 (patient reported as challenging yet able to perform without pain)   Ice applied to knee x 3 minutes    PATIENT EDUCATION:  Education details: Prognosis, exam findings, POC, HEP (reps/sets/frequency) Person educated: Patient Education method: Explanation, Demonstration, and Handouts Education comprehension: verbalized understanding and needs further education  HOME EXERCISE PROGRAM: Access Code:  [email or web address] URL: [email or web address] Date: 07/24/2022 Prepared by: Larna Daughters  Exercises - Supine Quad Set  - 1 x daily - 7 x weekly - 3 sets - 12 reps - Supine Heel Slide  - 1 x daily - 7 x weekly - 3 sets - 15 reps - Mini Squat with Counter Support  - 1 x daily - 5 x weekly - 3 sets - 8 reps - Active Straight Leg Raise with Quad Set  - 1 x daily - 5 x weekly - 3 sets - 8 reps - Sidelying Hip Abduction  - 1 x daily - 5 x weekly - 3 sets - 8 reps  ASSESSMENT:  CLINICAL IMPRESSION:  Treatment continued to focus on strengthening and ROM. Patient responded well to ROM and therex with improving knee flex ROM and no significant pain during session. She was able to utilize her right LE more with Sit to stand  and with step up without report of increased pain. She presents with some swelling and LE muscle weakness yet able to complete reps today without significant difficulty.  Pt will continue to benefit from skilled therapy to address remaining deficits in order to improve overall QoL and return to PLOF.      OBJECTIVE IMPAIRMENTS: Abnormal gait, decreased activity tolerance, decreased mobility, difficulty walking, decreased ROM, decreased strength, and pain.   ACTIVITY LIMITATIONS: bending, standing, squatting, and locomotion level  PARTICIPATION LIMITATIONS: driving, community activity, and yard work  PERSONAL FACTORS: Age, Fitness, Past/current experiences, Time since onset of injury/illness/exacerbation, and 3+ comorbidities: Breast cancer, DM, fibromyalgia, GERD, HTN, High cholesterol  are also affecting patient's functional outcome.   REHAB POTENTIAL: Good  CLINICAL DECISION MAKING: Stable/uncomplicated  EVALUATION COMPLEXITY: Low   GOALS: Goals reviewed with patient? No  SHORT TERM GOALS: Target date: 08/21/22  Pt will be independent with HEP to improve muscle strength and R knee AROM to decrease falls risk and improve overall functional mobility with  ADL's. Baseline: Given HEP Goal status: INITIAL  LONG TERM GOALS: Target date: 09/18/22  Pt will improve 5xSTS to < 12.6 seconds to demonstrate clinically significant improvement in LE strength for age matched norms.  Baseline: 17 seconds Goal status: INITIAL  2.  Pt will improve self selected 10 meter gait speed to > 1.0 m/s to demonstrate decreased falls risk for limited community ambulation.  Baseline: 0.86 m/s Goal status: INITIAL  3.  Pt will improve FOTO to target score to demonstrate clinically significant improvement in functional mobility.  Baseline: 08/05/22: 33 Goal status: INITIAL  4.  Pt will improve 30 second STS test to 14 reps to match age matched norms to demonstrate improved LE endurance for standing and walking ADL's. Baseline: 11 reps Goal status: INITIAL  PLAN:  PT FREQUENCY: 2x/week  PT DURATION: 8 weeks  PLANNED INTERVENTIONS: Therapeutic exercises, Therapeutic activity, Neuromuscular re-education, Balance training, Gait training, Patient/Family education, Joint mobilization, Stair training, Electrical stimulation, Cryotherapy, scar mobilization, and Manual therapy  PLAN FOR NEXT SESSION:   joint mobilization assessment, reassess HEP understanding    Ollen Bowl, PT Physical Therapist - [facility]  08/16/22, 8:49 AM

## 2022-08-20 ENCOUNTER — Encounter: Payer: Self-pay | Admitting: Physical Therapy

## 2022-08-20 ENCOUNTER — Ambulatory Visit: Payer: Medicare PPO | Admitting: Physical Therapy

## 2022-08-20 DIAGNOSIS — Z96651 Presence of right artificial knee joint: Secondary | ICD-10-CM | POA: Diagnosis not present

## 2022-08-20 DIAGNOSIS — M25561 Pain in right knee: Secondary | ICD-10-CM | POA: Diagnosis not present

## 2022-08-20 DIAGNOSIS — R262 Difficulty in walking, not elsewhere classified: Secondary | ICD-10-CM

## 2022-08-20 DIAGNOSIS — M6281 Muscle weakness (generalized): Secondary | ICD-10-CM | POA: Diagnosis not present

## 2022-08-20 DIAGNOSIS — G8929 Other chronic pain: Secondary | ICD-10-CM | POA: Diagnosis not present

## 2022-08-20 DIAGNOSIS — R2681 Unsteadiness on feet: Secondary | ICD-10-CM | POA: Diagnosis not present

## 2022-08-20 DIAGNOSIS — R278 Other lack of coordination: Secondary | ICD-10-CM | POA: Diagnosis not present

## 2022-08-20 DIAGNOSIS — R269 Unspecified abnormalities of gait and mobility: Secondary | ICD-10-CM | POA: Diagnosis not present

## 2022-08-20 DIAGNOSIS — R2689 Other abnormalities of gait and mobility: Secondary | ICD-10-CM | POA: Diagnosis not present

## 2022-08-20 NOTE — Therapy (Addendum)
OUTPATIENT PHYSICAL THERAPY LOWER EXTREMITY VISIT   Patient Name: Shannon Hancock MRN: [id number] DOB:1951/05/10, 72 y.o., female Today's Date: 08/20/2022  END OF SESSION:  PT End of Session - 08/20/22 0909     Visit Number 7    Number of Visits 17    Date for PT Re-Evaluation 09/18/22    Progress Note Due on Visit 10    PT Start Time 0802    PT Stop Time 0846    PT Time Calculation (min) 44 min    Activity Tolerance Patient tolerated treatment well;No increased pain    Behavior During Therapy WFL for tasks assessed/performed                Past Medical History:  Diagnosis Date   Allergy    Anxiety    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; ER/PR+, Her2-, 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%   Depression    Diabetes mellitus without complication ([place])    Diverticulosis    Fibromyalgia    GERD (gastroesophageal reflux disease)    Heart murmur    "no murmur" documented in PCP note by [name], MD 02/09/21   High cholesterol    Hypertension    Joint pain    Past Surgical History:  Procedure Laterality Date   BREAST LUMPECTOMY Right 2016   BREAST LUMPECTOMY WITH RADIOACTIVE SEED AND SENTINEL LYMPH NODE BIOPSY Right 02/13/2015   Procedure: RIGHT BREAST LUMPECTOMY WITH RADIOACTIVE SEED AND SENTINEL LYMPH NODE MAPPING;  Surgeon: [name], MD;  Location: [facility];  Service: General;  Laterality: Right;   FOOT SURGERY Left    TOTAL KNEE ARTHROPLASTY Right 03/26/2021   Procedure: TOTAL KNEE ARTHROPLASTY;  Surgeon: [name], MD;  Location: [facility];  Service: Orthopedics;  Laterality: Right;   TUBAL LIGATION     Patient Active Problem List   Diagnosis Date Noted   OA (osteoarthritis) of knee 03/26/2021   Primary osteoarthritis of right knee 03/26/2021   Leukocytosis 04/19/2020   Thyroid cyst 02/26/2018   Plantar fasciitis 02/19/2016   Family history of breast cancer in sister 03/10/2015   Breast cancer of lower-outer quadrant of right female  breast ([place]) 02/10/2015   Abdominal bloating 12/16/2013   Edema 11/09/2013   Urge incontinence 05/05/2013   Atrophic vaginitis 05/05/2013   Syncope 02/15/2013   Irritable bowel syndrome 08/20/2012   Hearing loss 11/07/2011   Generalized anxiety disorder 11/07/2011   Peripheral neuropathy 07/05/2011   Osteoporosis 07/05/2011   Tinnitus, bilateral 04/05/2011   HYPERCHOLESTEROLEMIA 04/05/2010   ANEMIA, IRON DEFICIENCY 04/03/2010   DEPRESSION, MAJOR, MODERATE 04/03/2010   Essential hypertension 04/03/2010   ALLERGIC RHINITIS 04/03/2010   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/03/2010   POSTMENOPAUSAL SYNDROME 04/03/2010   DEGENERATIVE JOINT DISEASE, CERVICAL SPINE 04/03/2010   FIBROMYALGIA 04/03/2010    PCP: [name] MD  REFERRING PROVIDER: Sharion Dove, MD  REFERRING DIAG: Z96.651 (ICD-10-CM) - Presence of right artificial knee joint  THERAPY DIAG:  Abnormality of gait and mobility  Difficulty in walking, not elsewhere classified  Chronic 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  Status post total right knee replacement  Unsteadiness on feet  Muscle weakness (generalized)  Rationale for Evaluation and Treatment: Rehabilitation  ONSET DATE: 03/16/2021  SUBJECTIVE:   SUBJECTIVE STATEMENT:  Pt reports being stiff today and did report some soreness after last visit.   Marland Kitchen  PERTINENT HISTORY: Pt is a 72 y.o. female returning to PT for R TKA. Previously here from  Fall of 2022 through Spring of 2023 for similar concern. PMH includes: Breast Cx, DM, fibromyalgia, GERD, HTN, High cholesterol. Pt well known from previous PT encounter. Reports trying [facility], and reports she was unsuccessful in improving her R knee TKE and lacks needed endurance with her walking. Also has swelling and pain with driving for prolonged trips. Reports continuous swelling and pain in her knee. Has had 1 "light fall" tripping over some clothes but not related to her R knee  condition. Able to get up on her own without injury. Her worst pain in her knee up to a 9/10 leading her to tense up. Normal pain levels up to a 6-7/10 with walking tasks and ADL's. Her biggest concern is her lack of AROM in her R knee. Pt's goal is to improve her R knee AROM to where it was, and to improvement in her swelling. Declines fever and cold chills. Has had f/u with [facility] with pt stating MD's had no concerns. Has appt next week.   PAIN:  Are you having pain? 4/10 Rt knee   PRECAUTIONS: None  WEIGHT BEARING RESTRICTIONS: No  FALLS:  Has patient fallen in last 6 months? Yes. Number of falls 1   PLOF: Independent  PATIENT GOALS: Improve R knee AROM, be able to garden, be more active, improve R knee swelling.  NEXT MD VISIT: N/A  OBJECTIVE:   INTERVENTION 08/20/22  ROM at beginning of session: lacking 15 degrees extension and 104 flexion  ROM at end of session: 9 deg from full extension  Treatment:   SAQ 2 x 10 with 5 sec hold measured knee extension AROM to 9 degrees following below interventions  LAQ x 10 pt has to lean back to prevent discomfort in the low back  Supine:   Manual: knee extension HS stretch in 90/90 position 3 x 60 sec  - Gastroc stretch with assistance with knee extension maintenance 3 x 60 sec  In extension superior patellar glides with assistance to maintain full available knee extension x 60 sec - noted to be painful but pain ceased upon end of exercise In extension with towel roll under foot A/P tibio femoral mobs x 60 sec- significant tightness and limitation noted    Nustep seat position 8, LE only; Lvl 3 x 4 minutes, set 8 with UE assist to work on full knee extension.   Ice applied to knee x 4 minutes    PATIENT EDUCATION:  Education details: Prognosis, exam findings, POC, HEP (reps/sets/frequency) Person educated: Patient Education method: Explanation, Demonstration, and Handouts Education comprehension: verbalized  understanding and needs further education  HOME EXERCISE PROGRAM: Access Code: [email or web address] URL: [email or web address] Date: 07/24/2022 Prepared by: Larna Daughters  Exercises - Supine Quad Set  - 1 x daily - 7 x weekly - 3 sets - 12 reps - Supine Heel Slide  - 1 x daily - 7 x weekly - 3 sets - 15 reps - Mini Squat with Counter Support  - 1 x daily - 5 x weekly - 3 sets - 8 reps - Active Straight Leg Raise with Quad Set  - 1 x daily - 5 x weekly - 3 sets - 8 reps - Sidelying Hip Abduction  - 1 x daily - 5 x weekly - 3 sets - 8 reps  ASSESSMENT:  CLINICAL IMPRESSION:  Treatment continued to focus on strengthening and ROM. Patient responded well to ROM and therex with improving knee extension ROM. Pt ROM in extension improved from laking 15  degrees actively to lacking 9 degrees actively following ROM interventions. Following ROM interventions PT worked on open and closed chain extension exercises to improve active ROM availability. Will continue to work on AROM in both flexion and extension in future sessions. Pt will continue to benefit from skilled physical therapy intervention to address impairments, improve QOL, and attain therapy goals.      OBJECTIVE IMPAIRMENTS: Abnormal gait, decreased activity tolerance, decreased mobility, difficulty walking, decreased ROM, decreased strength, and pain.   ACTIVITY LIMITATIONS: bending, standing, squatting, and locomotion level  PARTICIPATION LIMITATIONS: driving, community activity, and yard work  PERSONAL FACTORS: Age, Fitness, Past/current experiences, Time since onset of injury/illness/exacerbation, and 3+ comorbidities: Breast cancer, DM, fibromyalgia, GERD, HTN, High cholesterol  are also affecting patient's functional outcome.   REHAB POTENTIAL: Good  CLINICAL DECISION MAKING: Stable/uncomplicated  EVALUATION COMPLEXITY: Low   GOALS: Goals reviewed with patient? No  SHORT TERM GOALS: Target date: 08/21/22  Pt will be  independent with HEP to improve muscle strength and R knee AROM to decrease falls risk and improve overall functional mobility with ADL's. Baseline: Given HEP Goal status: INITIAL  LONG TERM GOALS: Target date: 09/18/22  Pt will improve 5xSTS to < 12.6 seconds to demonstrate clinically significant improvement in LE strength for age matched norms.  Baseline: 17 seconds Goal status: INITIAL  2.  Pt will improve self selected 10 meter gait speed to > 1.0 m/s to demonstrate decreased falls risk for limited community ambulation.  Baseline: 0.86 m/s Goal status: INITIAL  3.  Pt will improve FOTO to target score to demonstrate clinically significant improvement in functional mobility.  Baseline: 08/05/22: 33 Goal status: INITIAL  4.  Pt will improve 30 second STS test to 14 reps to match age matched norms to demonstrate improved LE endurance for standing and walking ADL's. Baseline: 11 reps Goal status: INITIAL  PLAN:  PT FREQUENCY: 2x/week  PT DURATION: 8 weeks  PLANNED INTERVENTIONS: Therapeutic exercises, Therapeutic activity, Neuromuscular re-education, Balance training, Gait training, Patient/Family education, Joint mobilization, Stair training, Electrical stimulation, Cryotherapy, scar mobilization, and Manual therapy  PLAN FOR NEXT SESSION:   joint mobilization assessment, reassess HEP understanding   Rivka Barbara PT, DPT   08/20/22, 9:09 AM

## 2022-08-21 ENCOUNTER — Other Ambulatory Visit: Payer: Self-pay | Admitting: Hematology and Oncology

## 2022-08-21 DIAGNOSIS — Z1231 Encounter for screening mammogram for malignant neoplasm of breast: Secondary | ICD-10-CM

## 2022-08-22 ENCOUNTER — Ambulatory Visit: Payer: Medicare PPO

## 2022-08-26 DIAGNOSIS — R051 Acute cough: Secondary | ICD-10-CM | POA: Diagnosis not present

## 2022-08-26 DIAGNOSIS — Z111 Encounter for screening for respiratory tuberculosis: Secondary | ICD-10-CM | POA: Diagnosis not present

## 2022-08-26 DIAGNOSIS — R0981 Nasal congestion: Secondary | ICD-10-CM | POA: Diagnosis not present

## 2022-08-26 DIAGNOSIS — R042 Hemoptysis: Secondary | ICD-10-CM | POA: Diagnosis not present

## 2022-08-26 DIAGNOSIS — J069 Acute upper respiratory infection, unspecified: Secondary | ICD-10-CM | POA: Diagnosis not present

## 2022-08-26 DIAGNOSIS — J029 Acute pharyngitis, unspecified: Secondary | ICD-10-CM | POA: Diagnosis not present

## 2022-08-26 DIAGNOSIS — R918 Other nonspecific abnormal finding of lung field: Secondary | ICD-10-CM | POA: Diagnosis not present

## 2022-08-27 ENCOUNTER — Ambulatory Visit: Payer: Medicare PPO | Admitting: Physical Therapy

## 2022-08-27 ENCOUNTER — Ambulatory Visit
Admission: RE | Admit: 2022-08-27 | Discharge: 2022-08-27 | Disposition: A | Discharge status: Home / Self Care | Payer: Medicare PPO | Source: Ambulatory Visit | Attending: Family Medicine | Admitting: Family Medicine

## 2022-08-27 ENCOUNTER — Other Ambulatory Visit: Payer: Self-pay | Admitting: Family Medicine

## 2022-08-27 DIAGNOSIS — R059 Cough, unspecified: Secondary | ICD-10-CM | POA: Diagnosis not present

## 2022-08-27 DIAGNOSIS — R042 Hemoptysis: Secondary | ICD-10-CM

## 2022-08-29 DIAGNOSIS — E049 Nontoxic goiter, unspecified: Secondary | ICD-10-CM | POA: Diagnosis not present

## 2022-08-29 DIAGNOSIS — L509 Urticaria, unspecified: Secondary | ICD-10-CM | POA: Diagnosis not present

## 2022-08-29 DIAGNOSIS — F3341 Major depressive disorder, recurrent, in partial remission: Secondary | ICD-10-CM | POA: Diagnosis not present

## 2022-08-29 DIAGNOSIS — E119 Type 2 diabetes mellitus without complications: Secondary | ICD-10-CM | POA: Diagnosis not present

## 2022-08-29 DIAGNOSIS — I700 Atherosclerosis of aorta: Secondary | ICD-10-CM | POA: Diagnosis not present

## 2022-08-29 DIAGNOSIS — E1169 Type 2 diabetes mellitus with other specified complication: Secondary | ICD-10-CM | POA: Diagnosis not present

## 2022-08-29 DIAGNOSIS — I10 Essential (primary) hypertension: Secondary | ICD-10-CM | POA: Diagnosis not present

## 2022-08-29 DIAGNOSIS — M797 Fibromyalgia: Secondary | ICD-10-CM | POA: Diagnosis not present

## 2022-08-29 DIAGNOSIS — E785 Hyperlipidemia, unspecified: Secondary | ICD-10-CM | POA: Diagnosis not present

## 2022-08-29 DIAGNOSIS — J0100 Acute maxillary sinusitis, unspecified: Secondary | ICD-10-CM | POA: Diagnosis not present

## 2022-08-30 ENCOUNTER — Ambulatory Visit: Payer: Medicare PPO

## 2022-09-02 ENCOUNTER — Ambulatory Visit: Payer: Medicare PPO

## 2022-09-03 ENCOUNTER — Ambulatory Visit: Payer: Medicare PPO | Attending: Student | Admitting: Physical Therapy

## 2022-09-03 NOTE — Therapy (Deleted)
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%   Depression    Diabetes mellitus without complication ([place])    Diverticulosis    Fibromyalgia    GERD (gastroesophageal reflux disease)    Heart murmur    "no murmur" documented in PCP note by [name], MD 02/09/21   High cholesterol    Hypertension    Joint pain    Past Surgical History:  Procedure Laterality Date   BREAST LUMPECTOMY Right 2016   BREAST LUMPECTOMY WITH RADIOACTIVE SEED AND SENTINEL LYMPH NODE BIOPSY Right 02/13/2015   Procedure: RIGHT BREAST LUMPECTOMY WITH RADIOACTIVE SEED AND SENTINEL LYMPH NODE MAPPING;  Surgeon: [name], MD;  Location: [facility];  Service: General;  Laterality: Right;   FOOT SURGERY Left    TOTAL KNEE ARTHROPLASTY Right 03/26/2021   Procedure: TOTAL KNEE ARTHROPLASTY;  Surgeon: [name], MD;  [facility];  Service: Orthopedics;  Laterality: Right;   TUBAL LIGATION     Patient Active Problem List   Diagnosis Date Noted   OA (osteoarthritis) of knee 03/26/2021   Primary osteoarthritis of right knee 03/26/2021   Leukocytosis 04/19/2020   Thyroid cyst 02/26/2018   Plantar fasciitis 02/19/2016   Family history of breast cancer in sister 03/10/2015   Breast cancer of lower-outer quadrant of right female breast ([place]) 02/10/2015   Abdominal bloating 12/16/2013   Edema 11/09/2013   Urge incontinence 05/05/2013   Atrophic vaginitis 05/05/2013   Syncope 02/15/2013   Irritable bowel syndrome 08/20/2012   Hearing loss 11/07/2011   Generalized anxiety disorder 11/07/2011   Peripheral neuropathy 07/05/2011   Osteoporosis 07/05/2011   Tinnitus, bilateral 04/05/2011    HYPERCHOLESTEROLEMIA 04/05/2010   ANEMIA, IRON DEFICIENCY 04/03/2010   DEPRESSION, MAJOR, MODERATE 04/03/2010   Essential hypertension 04/03/2010   ALLERGIC RHINITIS 04/03/2010   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/03/2010   POSTMENOPAUSAL SYNDROME 04/03/2010   DEGENERATIVE JOINT DISEASE, CERVICAL SPINE 04/03/2010   FIBROMYALGIA 04/03/2010    PCP: [name] MD  REFERRING PROVIDER: Sharion Dove, MD  REFERRING DIAG: Z96.651 (ICD-10-CM) - Presence of right artificial knee joint  THERAPY DIAG:  No diagnosis found.  Rationale for Evaluation and Treatment: Rehabilitation  ONSET DATE: 03/16/2021  SUBJECTIVE:   SUBJECTIVE STATEMENT:  Pt reports being stiff today and did report some soreness after last visit.   Marland Kitchen  PERTINENT HISTORY: Pt is a 72 y.o. female returning to PT for R TKA. Previously here from Fall of 2022 through Spring of 2023 for similar concern. PMH includes: Breast Cx, DM, fibromyalgia, GERD, HTN, High cholesterol. Pt well known from previous PT encounter. Reports trying [facility], and reports she was unsuccessful in improving her R knee TKE and lacks needed endurance with her walking. Also has swelling and pain with driving for prolonged trips. Reports continuous swelling and pain in her knee. Has had 1 "light fall" tripping over some clothes but not related to her R knee condition. Able to get up on her own without injury. Her worst pain in her knee up to a 9/10 leading her to tense up. Normal pain levels up to a 6-7/10 with walking tasks and ADL's. Her biggest concern is her lack of  AROM in her R knee. Pt's goal is to improve her R knee AROM to where it was, and to improvement in her swelling. Declines fever and cold chills. Has had f/u with [facility] with pt stating MD's had no concerns. Has appt next week.   PAIN:  Are you having pain? 4/10 Rt knee   PRECAUTIONS: None  WEIGHT BEARING RESTRICTIONS: No  FALLS:  Has patient fallen in last 6 months?  Yes. Number of falls 1   PLOF: Independent  PATIENT GOALS: Improve R knee AROM, be able to garden, be more active, improve R knee swelling.  NEXT MD VISIT: N/A  OBJECTIVE:   INTERVENTION 09/03/22  ROM at beginning of session: lacking 15 degrees extension and 104 flexion  ROM at end of session: 9 deg from full extension  Treatment:   SAQ 2 x 10 with 5 sec hold measured knee extension AROM to 9 degrees following below interventions  LAQ x 10 pt has to lean back to prevent discomfort in the low back  Supine:   Manual: knee extension HS stretch in 90/90 position 3 x 60 sec  - Gastroc stretch with assistance with knee extension maintenance 3 x 60 sec  In extension superior patellar glides with assistance to maintain full available knee extension x 60 sec - noted to be painful but pain ceased upon end of exercise In extension with towel roll under foot A/P tibio femoral mobs x 60 sec- significant tightness and limitation noted    Nustep seat position 8, LE only; Lvl 3 x 4 minutes, set 8 with UE assist to work on full knee extension.   Ice applied to knee x 4 minutes    PATIENT EDUCATION:  Education details: Prognosis, exam findings, POC, HEP (reps/sets/frequency) Person educated: Patient Education method: Explanation, Demonstration, and Handouts Education comprehension: verbalized understanding and needs further education  HOME EXERCISE PROGRAM: Access Code: [email or web address] URL: [email or web address] Date: 07/24/2022 Prepared by: Larna Daughters  Exercises - Supine Quad Set  - 1 x daily - 7 x weekly - 3 sets - 12 reps - Supine Heel Slide  - 1 x daily - 7 x weekly - 3 sets - 15 reps - Mini Squat with Counter Support  - 1 x daily - 5 x weekly - 3 sets - 8 reps - Active Straight Leg Raise with Quad Set  - 1 x daily - 5 x weekly - 3 sets - 8 reps - Sidelying Hip Abduction  - 1 x daily - 5 x weekly - 3 sets - 8 reps  ASSESSMENT:  CLINICAL IMPRESSION:  Treatment  continued to focus on strengthening and ROM. Patient responded well to ROM and therex with improving knee extension ROM. Pt ROM in extension improved from laking 15 degrees actively to lacking 9 degrees actively following ROM interventions. Following ROM interventions PT worked on open and closed chain extension exercises to improve active ROM availability. Will continue to work on AROM in both flexion and extension in future sessions. Pt will continue to benefit from skilled physical therapy intervention to address impairments, improve QOL, and attain therapy goals.      OBJECTIVE IMPAIRMENTS: Abnormal gait, decreased activity tolerance, decreased mobility, difficulty walking, decreased ROM, decreased strength, and pain.   ACTIVITY LIMITATIONS: bending, standing, squatting, and locomotion level  PARTICIPATION LIMITATIONS: driving, community activity, and yard work  PERSONAL FACTORS: Age, Fitness, Past/current experiences, Time since onset of injury/illness/exacerbation, and 3+ comorbidities: Breast cancer, DM, fibromyalgia, GERD, HTN, High cholesterol  are also  affecting patient's functional outcome.   REHAB POTENTIAL: Good  CLINICAL DECISION MAKING: Stable/uncomplicated  EVALUATION COMPLEXITY: Low   GOALS: Goals reviewed with patient? No  SHORT TERM GOALS: Target date: 08/21/22  Pt will be independent with HEP to improve muscle strength and R knee AROM to decrease falls risk and improve overall functional mobility with ADL's. Baseline: Given HEP Goal status: INITIAL  LONG TERM GOALS: Target date: 09/18/22  Pt will improve 5xSTS to < 12.6 seconds to demonstrate clinically significant improvement in LE strength for age matched norms.  Baseline: 17 seconds Goal status: INITIAL  2.  Pt will improve self selected 10 meter gait speed to > 1.0 m/s to demonstrate decreased falls risk for limited community ambulation.  Baseline: 0.86 m/s Goal status: INITIAL  3.  Pt will improve FOTO to  target score to demonstrate clinically significant improvement in functional mobility.  Baseline: 08/05/22: 33 Goal status: INITIAL  4.  Pt will improve 30 second STS test to 14 reps to match age matched norms to demonstrate improved LE endurance for standing and walking ADL's. Baseline: 11 reps Goal status: INITIAL  PLAN:  PT FREQUENCY: 2x/week  PT DURATION: 8 weeks  PLANNED INTERVENTIONS: Therapeutic exercises, Therapeutic activity, Neuromuscular re-education, Balance training, Gait training, Patient/Family education, Joint mobilization, Stair training, Electrical stimulation, Cryotherapy, scar mobilization, and Manual therapy  PLAN FOR NEXT SESSION:   joint mobilization assessment, reassess HEP understanding   Rivka Barbara PT, DPT   09/03/22, 7:50 AM

## 2022-09-05 ENCOUNTER — Ambulatory Visit: Payer: Medicare PPO

## 2022-09-09 ENCOUNTER — Ambulatory Visit: Payer: Medicare PPO

## 2022-09-10 ENCOUNTER — Ambulatory Visit: Payer: Medicare PPO

## 2022-09-12 ENCOUNTER — Ambulatory Visit: Payer: Medicare PPO

## 2022-09-16 ENCOUNTER — Ambulatory Visit: Payer: Medicare PPO

## 2022-09-17 ENCOUNTER — Ambulatory Visit: Payer: Medicare PPO

## 2022-09-19 ENCOUNTER — Ambulatory Visit: Payer: Medicare PPO

## 2022-09-23 ENCOUNTER — Ambulatory Visit: Payer: Medicare PPO

## 2022-09-23 IMAGING — CT CT RENAL STONE PROTOCOL
2 of 4 series · 16 of 46 positions shown, 18 images · non-contrast
Comparison: 04/18/2020

CLINICAL DATA: Flank pain.  Recent urinary tract infection.



[Series 2: stone full standard · axial · 0.69mm/px · z∈[-907,-502]mm · 13 of 89 slices shown, 15 images]
[im 4/89  soft-tissue]
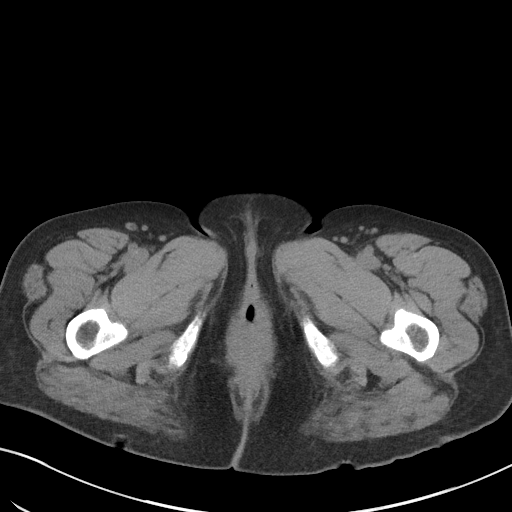
[im 4/89  bone]
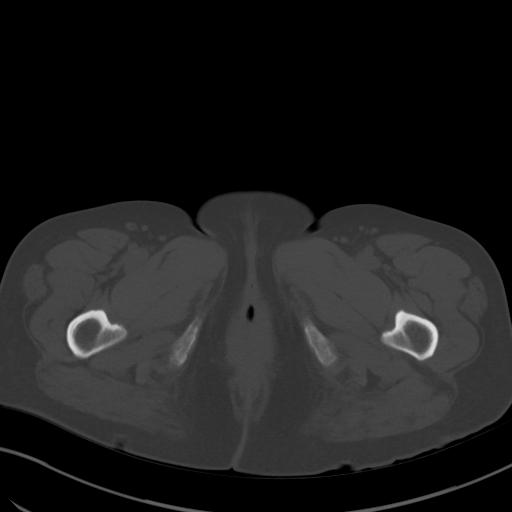
[im 12/89  soft-tissue]
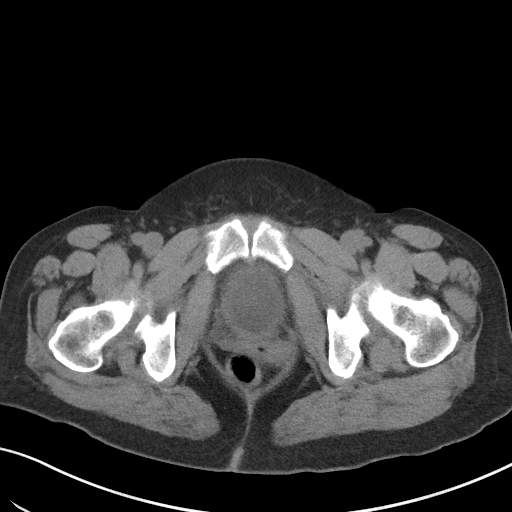
[im 19/89  soft-tissue]
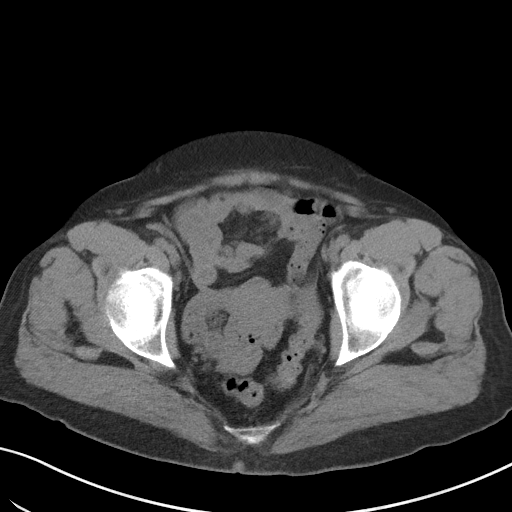
[im 26/89  soft-tissue]
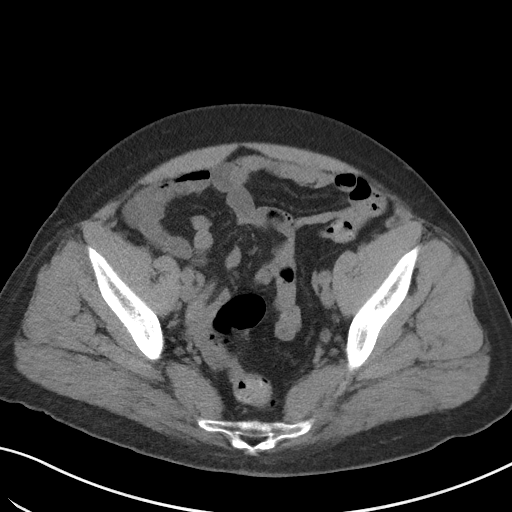
[im 30/89  soft-tissue]
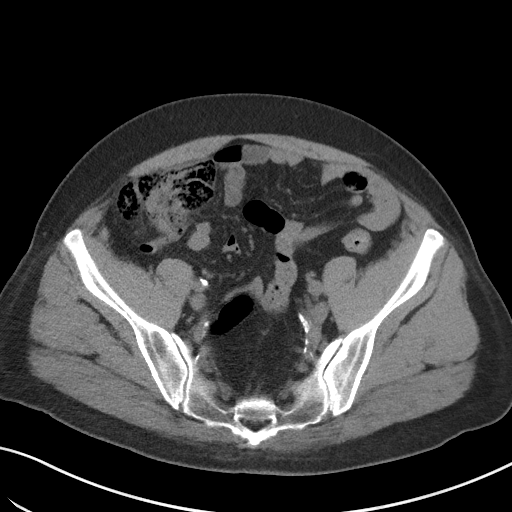
[im 37/89  soft-tissue]
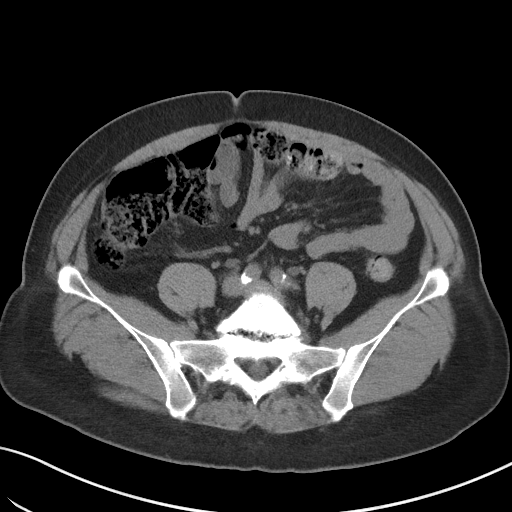
[im 45/89  soft-tissue]
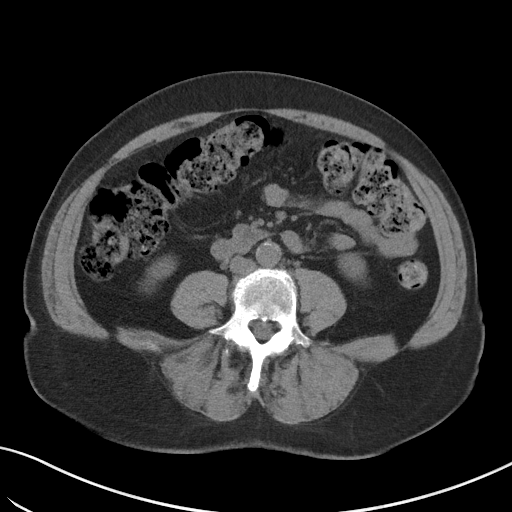
[im 52/89  soft-tissue]
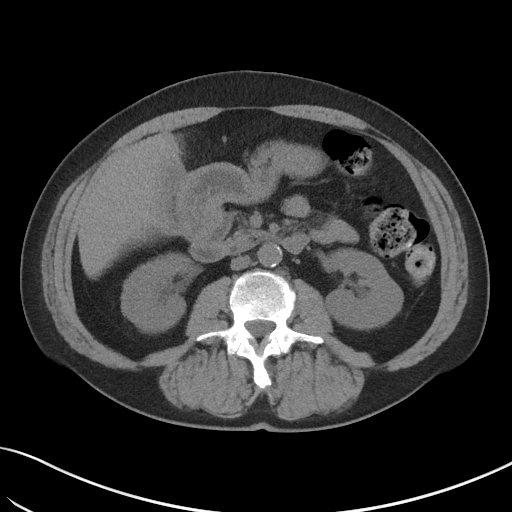
[im 59/89  soft-tissue]
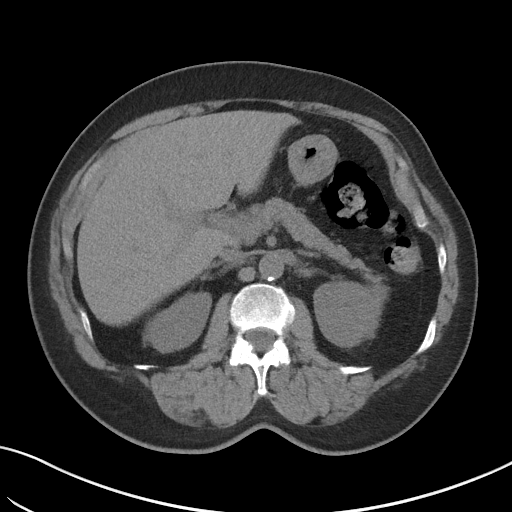
[im 59/89  bone]
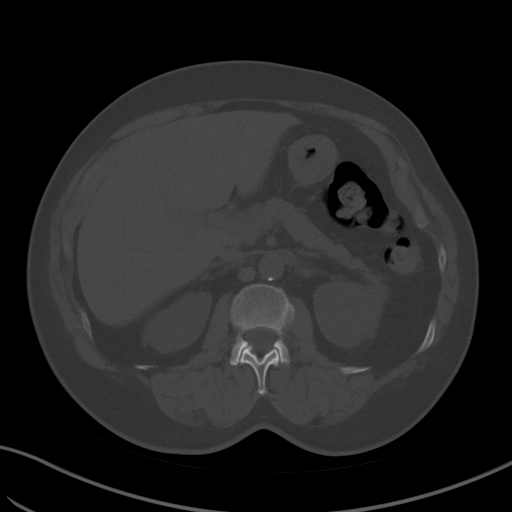
[im 63/89  soft-tissue]
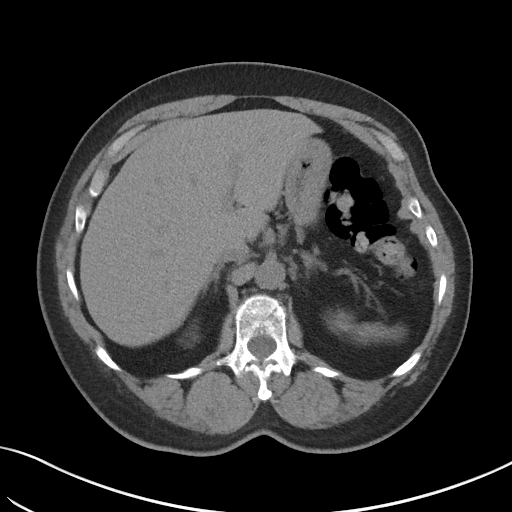
[im 70/89  soft-tissue]
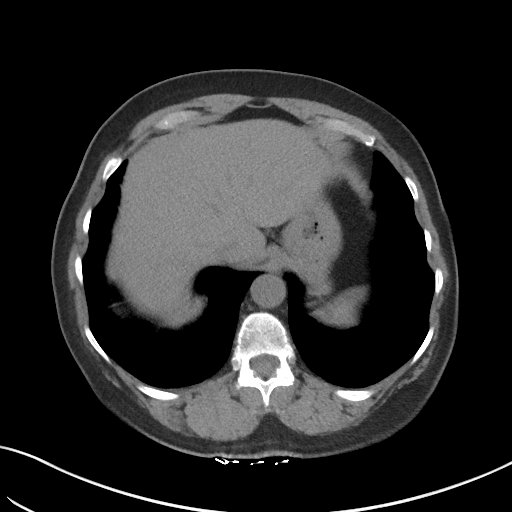
[im 78/89  soft-tissue]
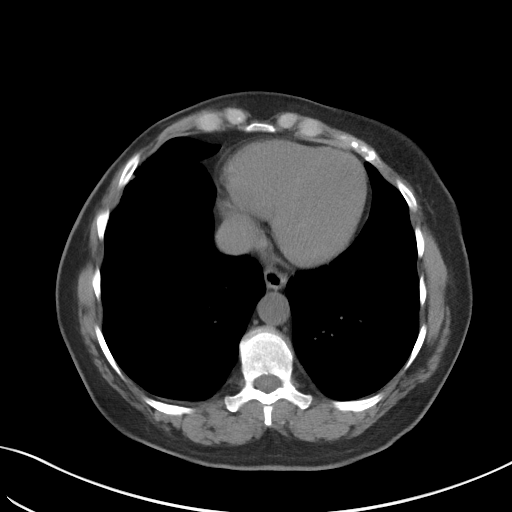
[im 85/89  soft-tissue]
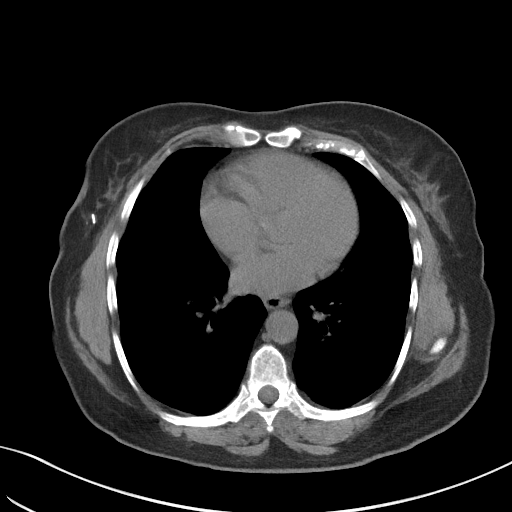

[Series 5: coronal · coronal · 0.75mm/px · 3 of 145 slices shown]
[im 49/145  soft-tissue]
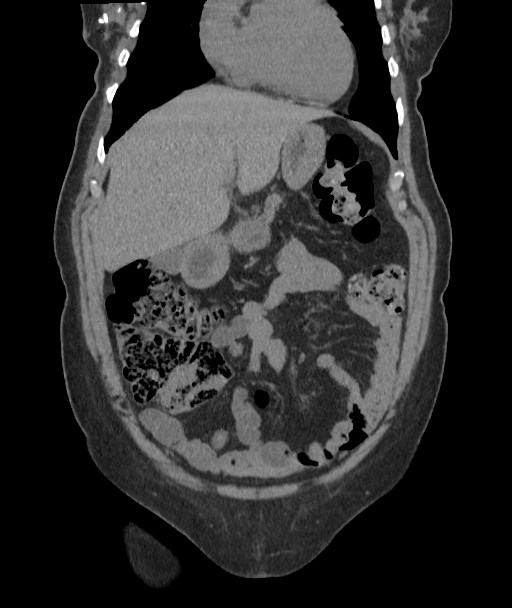
[im 65/145  soft-tissue]
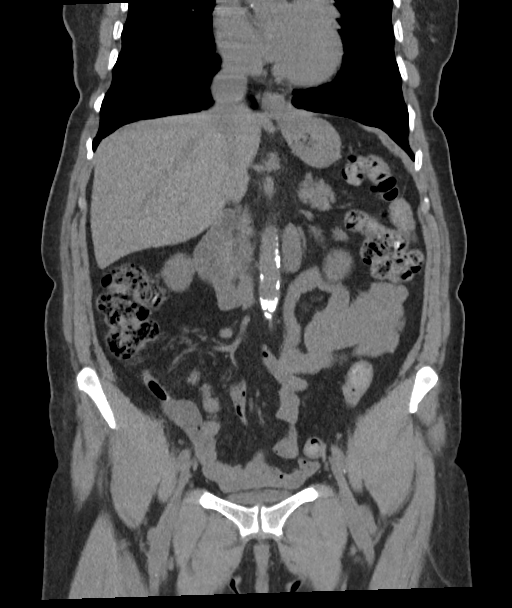
[im 81/145  soft-tissue]
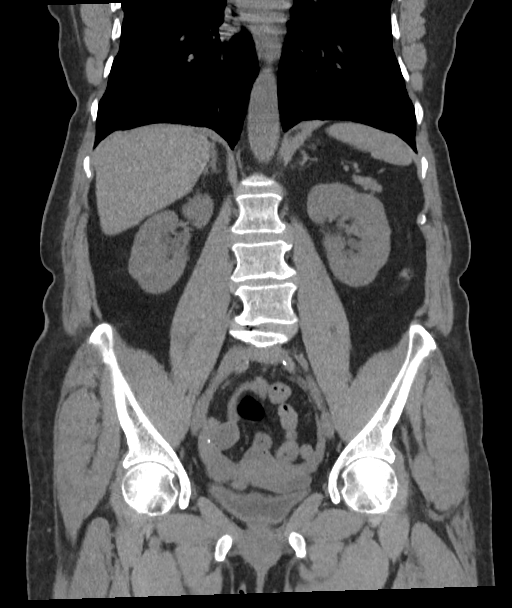

[16 of 46 positions shown; findings below may reference images not displayed]

FINDINGS: Lower chest: No acute findings.

Hepatobiliary: No mass visualized on this unenhanced exam.
Gallbladder is unremarkable. No evidence of biliary ductal
dilatation.

Pancreas: No mass or inflammatory process visualized on this
unenhanced exam.

Spleen:  Within normal limits in size.

Adrenals/Urinary tract: No evidence of urolithiasis or
hydronephrosis. Unremarkable unopacified urinary bladder.

Stomach/Bowel: No evidence of obstruction, inflammatory process, or
abnormal fluid collections. Normal appendix visualized.

Vascular/Lymphatic: No pathologically enlarged lymph nodes
identified. No evidence of abdominal aortic aneurysm. Aortic
atherosclerotic calcification noted.

Reproductive:  No mass or other significant abnormality.

Other:  None.

Musculoskeletal: No suspicious bone lesions identified. Severe lower
lumbar spine degenerative changes are seen, greatest at L5-S1.
IMPRESSION: No evidence of urolithiasis, hydronephrosis, or other acute
findings.

Aortic Atherosclerosis ([id number]).

## 2022-09-24 ENCOUNTER — Ambulatory Visit: Payer: Medicare PPO | Admitting: Physical Therapy

## 2022-09-24 DIAGNOSIS — R059 Cough, unspecified: Secondary | ICD-10-CM | POA: Diagnosis not present

## 2022-09-24 DIAGNOSIS — K219 Gastro-esophageal reflux disease without esophagitis: Secondary | ICD-10-CM | POA: Diagnosis not present

## 2022-09-24 DIAGNOSIS — J309 Allergic rhinitis, unspecified: Secondary | ICD-10-CM | POA: Diagnosis not present

## 2022-09-24 DIAGNOSIS — J4520 Mild intermittent asthma, uncomplicated: Secondary | ICD-10-CM | POA: Diagnosis not present

## 2022-09-26 ENCOUNTER — Ambulatory Visit: Payer: Medicare PPO

## 2022-09-30 ENCOUNTER — Ambulatory Visit: Payer: Medicare PPO

## 2022-10-01 ENCOUNTER — Ambulatory Visit: Payer: Medicare PPO

## 2022-10-03 ENCOUNTER — Ambulatory Visit: Payer: Medicare PPO

## 2022-10-07 ENCOUNTER — Ambulatory Visit: Payer: Medicare PPO

## 2022-10-08 ENCOUNTER — Ambulatory Visit
Admission: RE | Admit: 2022-10-08 | Discharge: 2022-10-08 | Disposition: A | Discharge status: Home / Self Care | Payer: Medicare PPO | Source: Ambulatory Visit | Attending: Hematology and Oncology | Admitting: Hematology and Oncology

## 2022-10-08 ENCOUNTER — Ambulatory Visit: Payer: Medicare PPO

## 2022-10-08 DIAGNOSIS — Z1231 Encounter for screening mammogram for malignant neoplasm of breast: Secondary | ICD-10-CM

## 2022-10-10 ENCOUNTER — Ambulatory Visit: Payer: Medicare PPO

## 2022-10-14 ENCOUNTER — Ambulatory Visit: Payer: Medicare PPO

## 2022-10-15 ENCOUNTER — Ambulatory Visit: Payer: Medicare PPO

## 2022-10-17 ENCOUNTER — Ambulatory Visit: Payer: Medicare PPO

## 2022-10-21 ENCOUNTER — Ambulatory Visit: Payer: Medicare PPO

## 2022-10-22 ENCOUNTER — Ambulatory Visit: Payer: Medicare PPO | Admitting: Physical Therapy

## 2022-10-24 ENCOUNTER — Encounter: Payer: Medicare PPO | Admitting: Physical Therapy

## 2022-11-16 ENCOUNTER — Other Ambulatory Visit: Payer: Self-pay | Admitting: Hematology and Oncology

## 2022-11-16 DIAGNOSIS — Z170 Estrogen receptor positive status [ER+]: Secondary | ICD-10-CM

## 2022-11-16 NOTE — Progress Notes (Signed)
Patient is complaining of breast  pain. Diagnostic mammograms and Ultrasound ordered

## 2022-11-17 DIAGNOSIS — K219 Gastro-esophageal reflux disease without esophagitis: Secondary | ICD-10-CM | POA: Diagnosis not present

## 2022-11-17 DIAGNOSIS — Z79899 Other long term (current) drug therapy: Secondary | ICD-10-CM | POA: Diagnosis not present

## 2022-11-17 DIAGNOSIS — R9431 Abnormal electrocardiogram [ECG] [EKG]: Secondary | ICD-10-CM | POA: Diagnosis not present

## 2022-11-17 DIAGNOSIS — R072 Precordial pain: Secondary | ICD-10-CM | POA: Diagnosis not present

## 2022-11-17 DIAGNOSIS — R0789 Other chest pain: Secondary | ICD-10-CM | POA: Diagnosis not present

## 2022-11-17 DIAGNOSIS — E785 Hyperlipidemia, unspecified: Secondary | ICD-10-CM | POA: Diagnosis not present

## 2022-11-17 DIAGNOSIS — E119 Type 2 diabetes mellitus without complications: Secondary | ICD-10-CM | POA: Diagnosis not present

## 2022-11-17 DIAGNOSIS — I10 Essential (primary) hypertension: Secondary | ICD-10-CM | POA: Diagnosis not present

## 2022-11-17 DIAGNOSIS — E871 Hypo-osmolality and hyponatremia: Secondary | ICD-10-CM | POA: Diagnosis not present

## 2022-11-17 DIAGNOSIS — Z136 Encounter for screening for cardiovascular disorders: Secondary | ICD-10-CM | POA: Diagnosis not present

## 2022-11-17 DIAGNOSIS — R079 Chest pain, unspecified: Secondary | ICD-10-CM | POA: Diagnosis not present

## 2022-11-18 DIAGNOSIS — R9431 Abnormal electrocardiogram [ECG] [EKG]: Secondary | ICD-10-CM | POA: Diagnosis not present

## 2022-11-18 DIAGNOSIS — Z136 Encounter for screening for cardiovascular disorders: Secondary | ICD-10-CM | POA: Diagnosis not present

## 2022-11-18 DIAGNOSIS — I10 Essential (primary) hypertension: Secondary | ICD-10-CM | POA: Diagnosis not present

## 2022-11-18 DIAGNOSIS — R079 Chest pain, unspecified: Secondary | ICD-10-CM | POA: Diagnosis not present

## 2022-11-18 DIAGNOSIS — K219 Gastro-esophageal reflux disease without esophagitis: Secondary | ICD-10-CM | POA: Diagnosis not present

## 2022-11-18 DIAGNOSIS — R0789 Other chest pain: Secondary | ICD-10-CM | POA: Diagnosis not present

## 2022-11-18 DIAGNOSIS — E871 Hypo-osmolality and hyponatremia: Secondary | ICD-10-CM | POA: Diagnosis not present

## 2022-12-12 DIAGNOSIS — R079 Chest pain, unspecified: Secondary | ICD-10-CM | POA: Diagnosis not present

## 2022-12-12 DIAGNOSIS — I10 Essential (primary) hypertension: Secondary | ICD-10-CM | POA: Diagnosis not present

## 2022-12-12 DIAGNOSIS — F419 Anxiety disorder, unspecified: Secondary | ICD-10-CM | POA: Diagnosis not present

## 2022-12-18 IMAGING — MR MR BREAST BILAT WO/W CM
8 of 12 series · 33 of 48 positions shown · IV contrast (6 ML GADAVIST)
Comparison: 07/09/2021 mammogram, 10/24/2020 MRI

CLINICAL DATA: History of RIGHT lumpectomy for invasive ductal
carcinoma and ductal carcinoma in-situ. Patient had adjuvant
radiation therapy. Routine surveillance; currently asymptomatic.

EXAM:
BILATERAL BREAST MRI WITH AND WITHOUT CONTRAST
TECHNIQUE: Multiplanar, multisequence MR images of both breasts were obtained
prior to and following the intravenous administration of 6 ml of
Gadavist

[Series 2: t2_tirm_tra ipat (a-p) · axial · 3.0mm · 0.70mm/px · 1 of 55 slices shown]
[im 1/55]
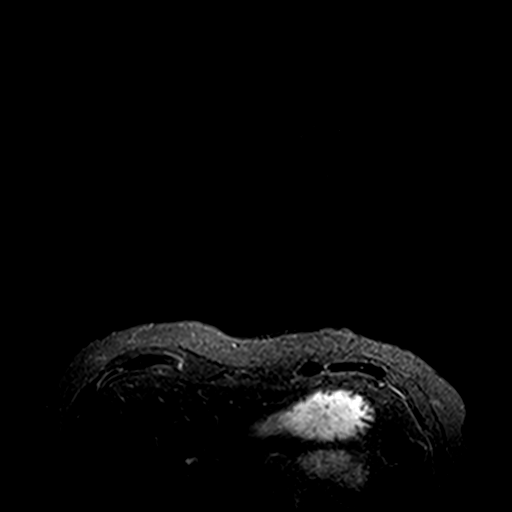

[Series 3: fl3d pre-cm no · axial · non-contrast · 1.2mm · 0.89mm/px · z∈[-65,+88]mm · 5 of 128 slices shown]
[im 1/128]
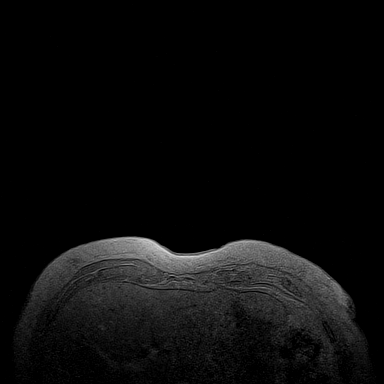
[im 32/128]
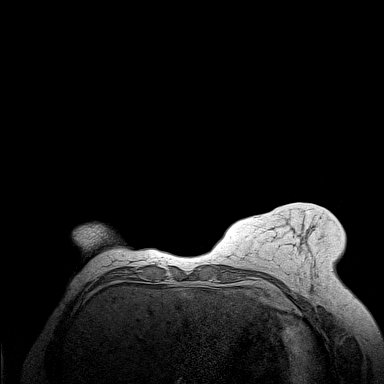
[im 64/128]
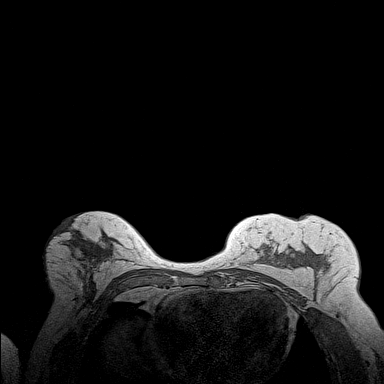
[im 96/128]
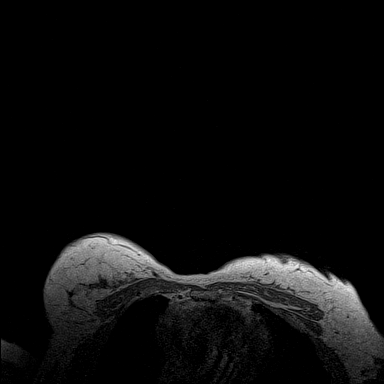
[im 128/128]
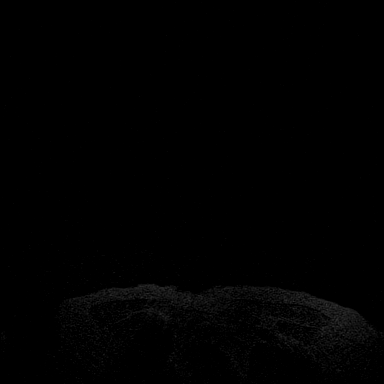

[Series 4: fl3d pre-cm · axial · non-contrast · 1.2mm · 0.89mm/px · z∈[-65,+88]mm · 5 of 128 slices shown]
[im 1/128]
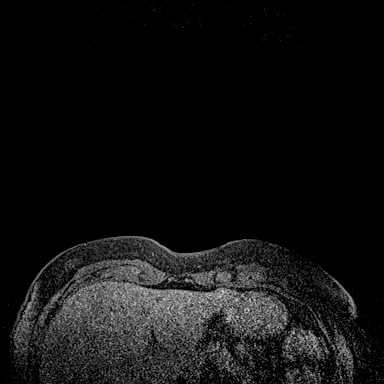
[im 32/128]
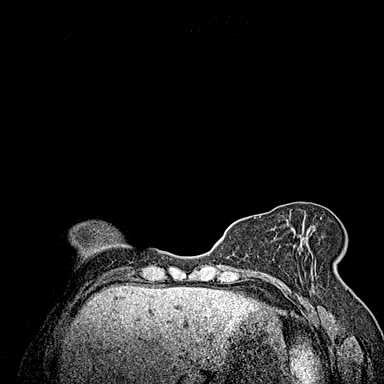
[im 64/128]
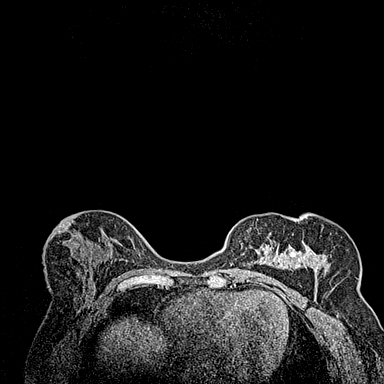
[im 96/128]
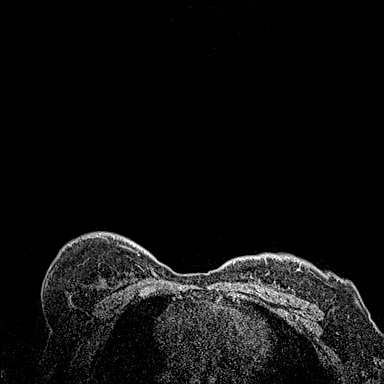
[im 128/128]
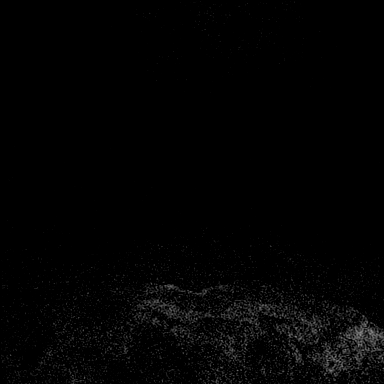

[Series 5: fl3d post-cm 20 · axial · 1.2mm · 0.89mm/px · z∈[-65,+88]mm · 5 of 128 slices shown (1 of 3)]
[im 1/128]
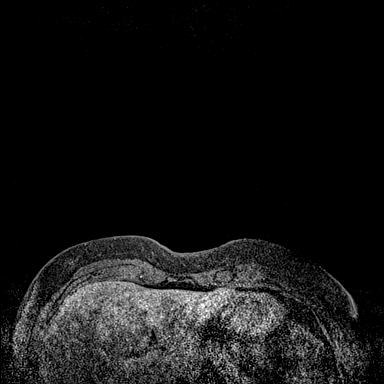
[im 32/128]
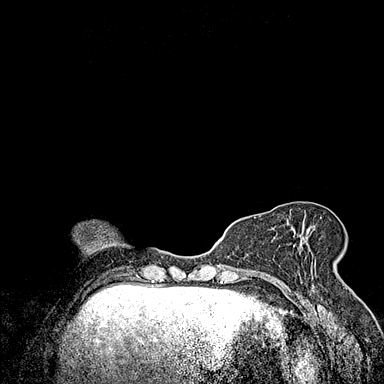
[im 64/128]
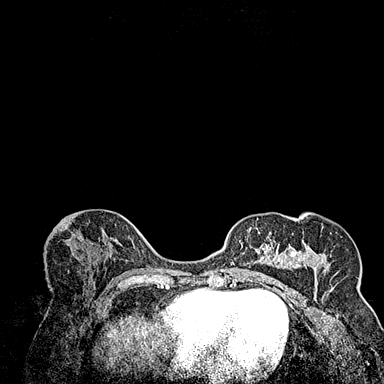
[im 96/128]
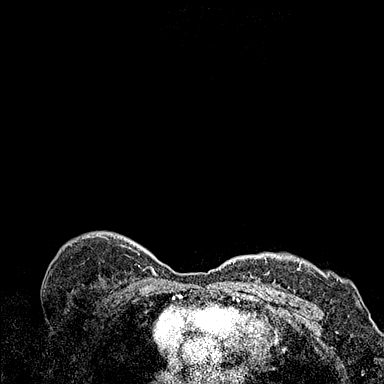
[im 128/128]
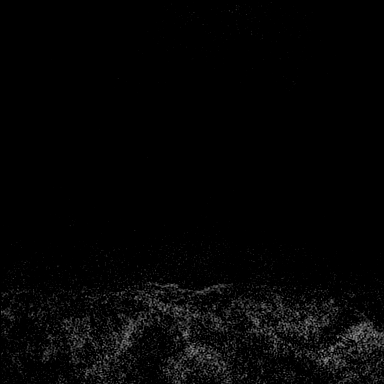

[Series 6: fl3d post-cm 20 · axial · 1.2mm · 0.89mm/px · z∈[-65,+88]mm · 5 of 128 slices shown (2 of 3)]
[im 1/128]
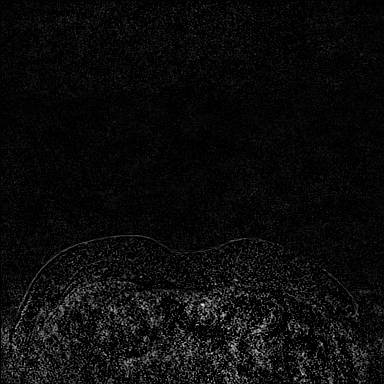
[im 32/128]
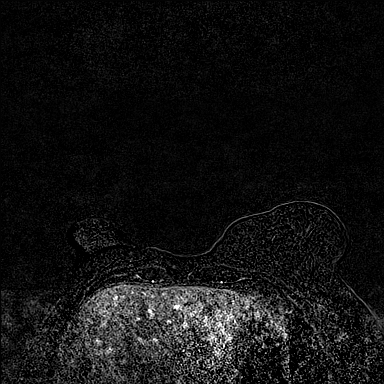
[im 64/128]
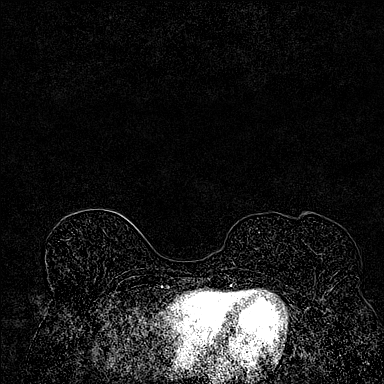
[im 96/128]
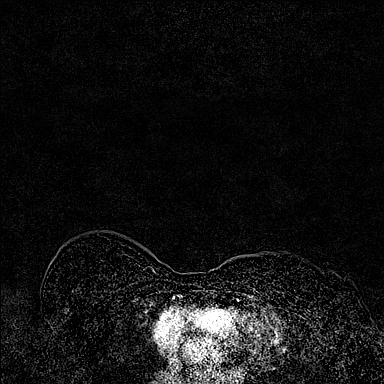
[im 128/128]
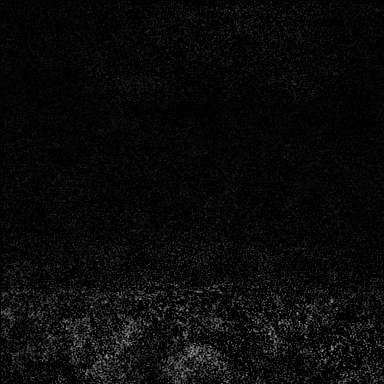

[Series 7: fl3d post-cm 20 · axial · 153.6mm · 0.89mm/px · 1 of 1 slices shown (3 of 3)]
[im 1/1]
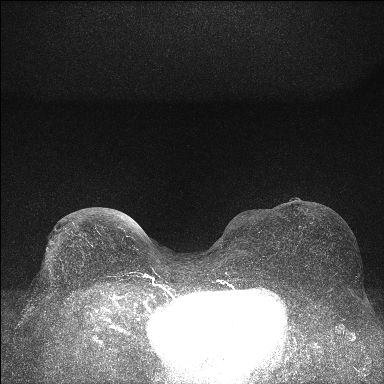

[Series 8: fl3d post-cm 3 · axial · 1.2mm · 0.89mm/px · z∈[-65,+88]mm · 6 of 128 slices shown (1 of 2)]
[im 1/128]
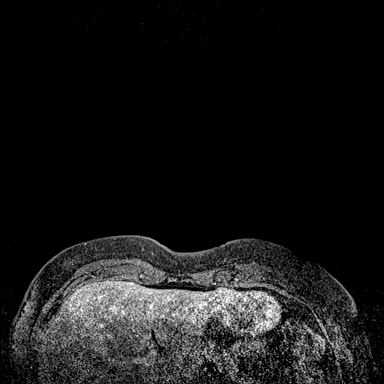
[im 26/128]
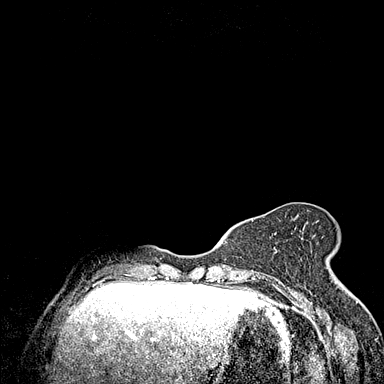
[im 51/128]
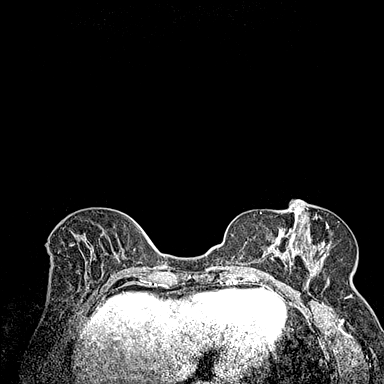
[im 77/128]
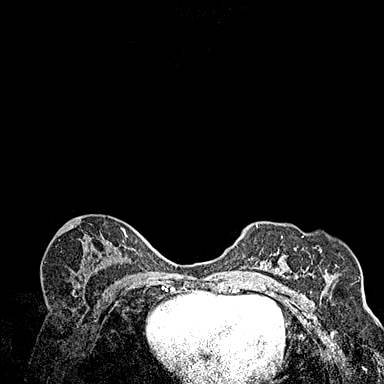
[im 102/128]
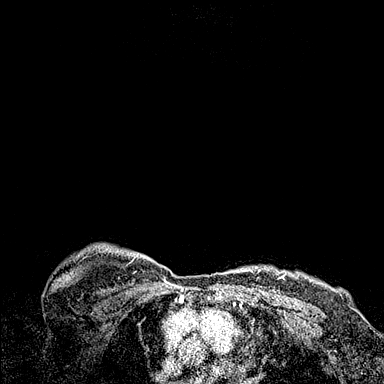
[im 128/128]
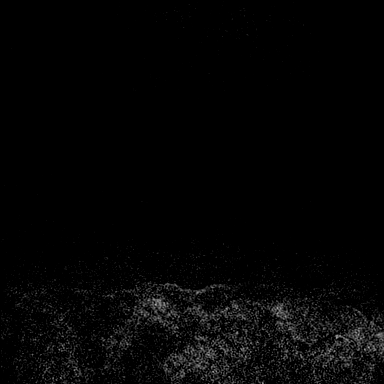

[Series 9: fl3d post-cm 3 · axial · 1.2mm · 0.89mm/px · z∈[-65,+56]mm · 5 of 128 slices shown (2 of 2)]
[im 1/128]
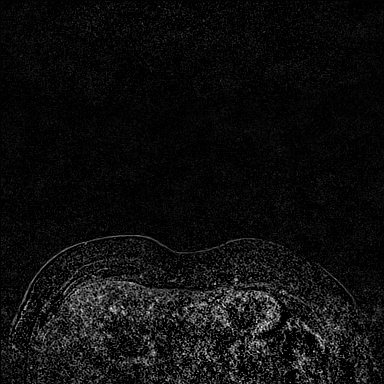
[im 26/128]
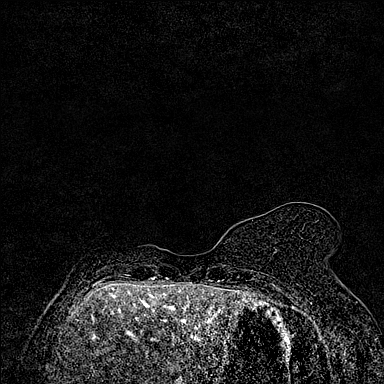
[im 51/128]
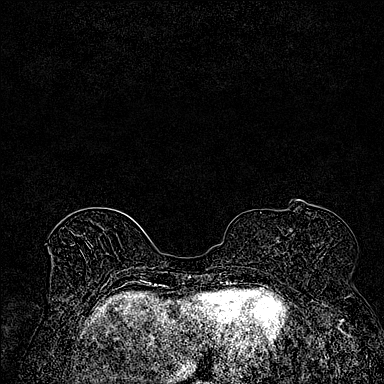
[im 77/128]
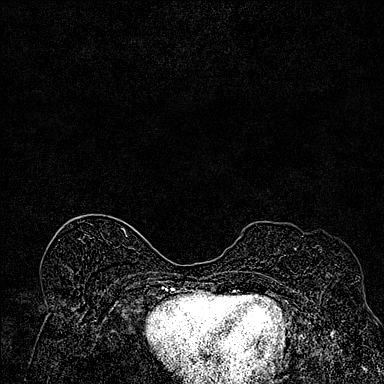
[im 102/128]
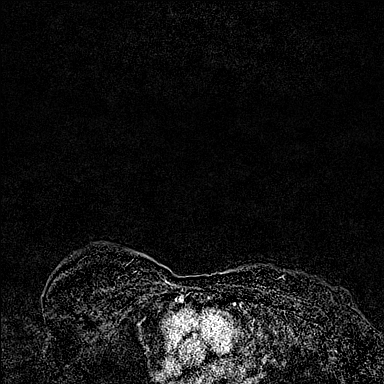

[33 of 48 positions shown; findings below may reference images not displayed]

Three-dimensional MR images were rendered by post-processing of the
original MR data on an independent workstation. The
three-dimensional MR images were interpreted, and findings are
reported in the following complete MRI report for this study. Three
dimensional images were evaluated at the independent interpreting
workstation using the DynaCAD thin client.
FINDINGS: Breast composition: c. Heterogeneous fibroglandular tissue.

Background parenchymal enhancement: Minimal

Right breast: Postoperative changes are seen in the LATERAL aspect
of the RIGHT breast. No mass or abnormal enhancement.

Left breast: No mass or abnormal enhancement.

Lymph nodes: No abnormal appearing lymph nodes.

Ancillary findings:  None.
IMPRESSION: No MRI evidence for malignancy in either breast. Expected
postoperative changes of the RIGHT breast.

RECOMMENDATION:
Recommend screening mammogram in July 2022.

The [HOSPITAL] recommends annual MRI in addition
to annual mammography for women with personal history of breast
cancer and dense breast tissue, or those diagnosed before age 50.

BI-RADS CATEGORY  2: Benign.

## 2022-12-24 ENCOUNTER — Ambulatory Visit
Admission: RE | Admit: 2022-12-24 | Discharge: 2022-12-24 | Disposition: A | Discharge status: Home / Self Care | Payer: Medicare PPO | Source: Ambulatory Visit | Attending: Hematology and Oncology | Admitting: Hematology and Oncology

## 2022-12-24 ENCOUNTER — Ambulatory Visit: Payer: Medicare PPO

## 2022-12-24 DIAGNOSIS — H5203 Hypermetropia, bilateral: Secondary | ICD-10-CM | POA: Diagnosis not present

## 2022-12-24 DIAGNOSIS — C50511 Malignant neoplasm of lower-outer quadrant of right female breast: Secondary | ICD-10-CM

## 2022-12-24 DIAGNOSIS — H2512 Age-related nuclear cataract, left eye: Secondary | ICD-10-CM | POA: Diagnosis not present

## 2022-12-24 DIAGNOSIS — Z961 Presence of intraocular lens: Secondary | ICD-10-CM | POA: Diagnosis not present

## 2022-12-24 DIAGNOSIS — H35373 Puckering of macula, bilateral: Secondary | ICD-10-CM | POA: Diagnosis not present

## 2022-12-24 DIAGNOSIS — N644 Mastodynia: Secondary | ICD-10-CM | POA: Diagnosis not present

## 2023-01-06 DIAGNOSIS — M797 Fibromyalgia: Secondary | ICD-10-CM | POA: Diagnosis not present

## 2023-01-06 DIAGNOSIS — I10 Essential (primary) hypertension: Secondary | ICD-10-CM | POA: Diagnosis not present

## 2023-01-06 DIAGNOSIS — F3341 Major depressive disorder, recurrent, in partial remission: Secondary | ICD-10-CM | POA: Diagnosis not present

## 2023-01-06 DIAGNOSIS — E785 Hyperlipidemia, unspecified: Secondary | ICD-10-CM | POA: Diagnosis not present

## 2023-01-06 DIAGNOSIS — Z9181 History of falling: Secondary | ICD-10-CM | POA: Diagnosis not present

## 2023-01-06 DIAGNOSIS — I700 Atherosclerosis of aorta: Secondary | ICD-10-CM | POA: Diagnosis not present

## 2023-01-06 DIAGNOSIS — F419 Anxiety disorder, unspecified: Secondary | ICD-10-CM | POA: Diagnosis not present

## 2023-01-06 DIAGNOSIS — E119 Type 2 diabetes mellitus without complications: Secondary | ICD-10-CM | POA: Diagnosis not present

## 2023-01-27 ENCOUNTER — Ambulatory Visit: Payer: Medicare PPO | Admitting: Cardiology

## 2023-01-30 ENCOUNTER — Other Ambulatory Visit: Payer: Self-pay | Admitting: Otolaryngology

## 2023-01-30 DIAGNOSIS — H6123 Impacted cerumen, bilateral: Secondary | ICD-10-CM | POA: Diagnosis not present

## 2023-01-30 DIAGNOSIS — H70202 Unspecified petrositis, left ear: Secondary | ICD-10-CM | POA: Diagnosis not present

## 2023-01-30 DIAGNOSIS — H90A22 Sensorineural hearing loss, unilateral, left ear, with restricted hearing on the contralateral side: Secondary | ICD-10-CM | POA: Diagnosis not present

## 2023-01-30 DIAGNOSIS — Q181 Preauricular sinus and cyst: Secondary | ICD-10-CM

## 2023-02-06 ENCOUNTER — Other Ambulatory Visit: Payer: Medicare PPO

## 2023-02-18 ENCOUNTER — Ambulatory Visit: Payer: Medicare PPO | Admitting: Cardiology

## 2023-02-18 ENCOUNTER — Encounter: Payer: Self-pay | Admitting: Cardiology

## 2023-02-18 VITALS — BP 107/65 | HR 75 | Resp 16 | Ht 63.0 in | Wt 167.2 lb

## 2023-02-18 DIAGNOSIS — R072 Precordial pain: Secondary | ICD-10-CM

## 2023-02-18 DIAGNOSIS — I700 Atherosclerosis of aorta: Secondary | ICD-10-CM

## 2023-02-18 DIAGNOSIS — E782 Mixed hyperlipidemia: Secondary | ICD-10-CM | POA: Diagnosis not present

## 2023-02-18 DIAGNOSIS — Z01810 Encounter for preprocedural cardiovascular examination: Secondary | ICD-10-CM

## 2023-02-18 DIAGNOSIS — R9431 Abnormal electrocardiogram [ECG] [EKG]: Secondary | ICD-10-CM

## 2023-02-18 DIAGNOSIS — K219 Gastro-esophageal reflux disease without esophagitis: Secondary | ICD-10-CM

## 2023-02-18 DIAGNOSIS — I10 Essential (primary) hypertension: Secondary | ICD-10-CM

## 2023-02-18 DIAGNOSIS — E119 Type 2 diabetes mellitus without complications: Secondary | ICD-10-CM

## 2023-02-18 NOTE — Progress Notes (Signed)
ID:  Shannon Hancock, DOB 1950-08-23, MRN [id number]  PCP:  Laurann Montana, MD  Cardiologist:  Tessa Lerner, DO, [facility] (established care 02/18/23)  REASON FOR CONSULT: Chest pain  REQUESTING PHYSICIAN:  Laurann Montana, MD [phone] [place],  [place] [address]  Chief Complaint  Patient presents with   Chest Pain   New Patient (Initial Visit)    HPI  Shannon Hancock is a 72 y.o. African-American female who presents to the clinic for evaluation of chest pain at the request of Laurann Montana, MD. Her past medical history and cardiovascular risk factors include: Hypertension, hyperlipidemia, diabetes type 2,?  Mitral valve prolapse, aortic atherosclerosis (CT abdomen and pelvis 03/2020)  Patient referred to the practice for evaluation of precordial pain.  In May 2024 she was in [place] when she had an episode of chest pain that warranted an ER visit.  The night before she had fried soft shell crabs with wine and during the early morning hours started experiencing precordial discomfort associated with diaphoresis, nausea and went to the local ER for evaluation.  High sensitive troponins were negative x 3, EKG concerning for possible ischemia, she was kept overnight for observation, underwent nuclear stress test which was reported to be low risk.  In the interim she was given PPIs and Protonix and her upper chest discomfort had resolved.  She is supposed to have a colonoscopy in July 2024 with [facility] gastroenterology.  But given the recent incident in May 2024 she was asked to follow-up with cardiology for reevaluation.  Patient continues to have intermittent episodes of precordial discomfort usually brought on by what she calls " eating a lot of foods."  Last episode was within a week and the day prior to that she had spaghetti, fried [place], etc.  The pain is not brought on by effort related activities, does not resolve with rest.  Her overall functional capacity is  limited due to osteoarthritis of the right knee.  FUNCTIONAL STATUS: No structured exercise program or daily routine.   CARDIAC DATABASE: EKG: 02/18/2023: Sinus rhythm, 76 bpm, nonspecific T wave normality, without underlying injury pattern  Echocardiogram: No results found for this or any previous visit from the past 1095 days.   Stress Testing: May 2024 at [facility] in [place] Normal rest/stress SPECT myocardial perfusion images. There is no evidence of significant infarction or ischemia.  Left ventricle function is normal. There are no wall motion abnormalities. The left ventricle is normal in size. Transient ischemic dilation ratio is considered normal. This study suggests a low risk for cardiovascular event and is associated with a cardiac mortality of less than 1% per year. There is no no prior study for comparison.     ALLERGIES: Allergies  Allergen Reactions   Olive Oil Nausea And Vomiting    Severe  Nausea / Vomiting.   Oxycodone Other (See Comments)    Patient reports itching    MEDICATION LIST PRIOR TO VISIT: Current Meds  Medication Sig   acetaminophen (TYLENOL) 650 MG CR tablet 2 tablets as needed   alendronate (FOSAMAX) 70 MG tablet TAKE 1 TABLET BY MOUTH ONCE A WEEK. TAKE WITH A FULL GLASS OF WATER ON AN EMPTY STOMACH.   ALPRAZolam (XANAX) 1 MG tablet TAKE 1 TABLET BY MOUTH 4 TIMES A DAY AS NEEDED FOR SLEEP OR ANXIETY (Patient taking differently: Take 0.5-1 mg by mouth 3 (three) times daily as needed for anxiety (Depression).)   amLODipine (NORVASC) 5 MG tablet Take  5 mg by mouth daily.   Artificial Tear Ointment (DRY EYES OP) Place 1 drop into both eyes daily as needed (Dry eye).   budesonide-formoterol (SYMBICORT) 80-4.5 MCG/ACT inhaler Inhale 2 puffs into the lungs daily as needed (Wheezing and Asthma).   famotidine (PEPCID) 20 MG tablet Take 20 mg by mouth at bedtime.   fexofenadine (ALLEGRA) 180 MG tablet Take 180 mg by mouth daily.    fexofenadine-pseudoephedrine (ALLEGRA-D) 60-120 MG 12 hr tablet Take 1 tablet by mouth 2 (two) times daily.   fluticasone (FLONASE) 50 MCG/ACT nasal spray Place 2 sprays into both nostrils daily as needed for allergies or rhinitis.   Lactobacillus (PROBIOTIC ACIDOPHILUS PO) Take 1 capsule by mouth daily.   losartan-hydrochlorothiazide (HYZAAR) 100-12.5 MG per tablet TAKE 1 TABLET BY MOUTH DAILY.   LYRICA 100 MG capsule TAKE ONE CAPSULE TWICE A DAY   methocarbamol (ROBAXIN) 500 MG tablet Take 1 tablet (500 mg total) by mouth every 6 (six) hours as needed for muscle spasms.   metoprolol succinate (TOPROL-XL) 25 MG 24 hr tablet Take 25 mg by mouth daily as needed (High blood pressure).   omeprazole (PRILOSEC) 40 MG capsule Take 40 mg by mouth daily.   rosuvastatin (CRESTOR) 5 MG tablet Take 5 mg by mouth 2 (two) times a week.   solifenacin (VESICARE) 5 MG tablet Take 5 mg by mouth daily.   ZETIA 10 MG tablet TAKE 1 TABLET BY MOUTH DAILY.     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%   Depression    Diabetes mellitus without complication (HCC)    Diverticulosis    Fibromyalgia    GERD (gastroesophageal reflux disease)    Heart murmur    "no murmur" documented in PCP note by Laurann Montana, MD 02/09/21   High cholesterol    Hypertension    Joint pain     PAST SURGICAL HISTORY: Past Surgical History:  Procedure Laterality Date   BREAST LUMPECTOMY Right 2016   BREAST LUMPECTOMY WITH RADIOACTIVE SEED AND SENTINEL LYMPH NODE BIOPSY Right 02/13/2015   Procedure: RIGHT BREAST LUMPECTOMY WITH RADIOACTIVE SEED AND SENTINEL LYMPH NODE MAPPING;  Surgeon: Chevis Pretty III, MD;  Location: [facility];  Service: General;  Laterality: Right;   FOOT SURGERY Left    TOTAL KNEE ARTHROPLASTY Right 03/26/2021   Procedure: TOTAL KNEE ARTHROPLASTY;  Surgeon: Ollen Gross,  MD;  Location: [facility];  Service: Orthopedics;  Laterality: Right;   TUBAL LIGATION      FAMILY HISTORY: The patient family history includes Alzheimer's disease in her mother; Anxiety disorder in her mother; Breast cancer in her cousin, maternal aunt, and another family member; Breast cancer (age of onset: 36) in her sister; Breast cancer (age of onset: 52) in her paternal aunt; Cancer in her maternal grandfather and paternal aunt; Cirrhosis in her paternal uncle; Colon cancer (age of onset: 49) in her maternal grandmother; Colon polyps in her father and sister; Congestive Heart Failure in her paternal uncle; Coronary artery disease in her father; Depression in her father and mother; Diabetes in her father, mother, and paternal uncle; Healthy in her daughter and daughter; Heart disease in her father; Hyperlipidemia in her brother, father, and mother; Hypertension in her brother, father, and mother; Kidney cancer in her cousin and paternal grandfather; Kidney disease in her father; Lung cancer in her maternal uncle  and other family members; Parkinson's disease in her maternal grandfather; Prostate cancer (age of onset: 7) in her brother; Prostate cancer (age of onset: 62) in her father; Stroke in her mother; Urinary tract infection in her sister.  SOCIAL HISTORY:  The patient  reports that she quit smoking about 54 years ago. Her smoking use included cigarettes. She has never used smokeless tobacco. She reports that she does not drink alcohol and does not use drugs.  REVIEW OF SYSTEMS: Review of Systems  Cardiovascular:  Negative for chest pain, claudication, dyspnea on exertion, irregular heartbeat, leg swelling, near-syncope, orthopnea, palpitations, paroxysmal nocturnal dyspnea and syncope.  Respiratory:  Negative for shortness of breath.   Hematologic/Lymphatic: Negative for bleeding problem.  Musculoskeletal:  Negative for muscle cramps and myalgias.  Gastrointestinal:  Positive for heartburn.   Neurological:  Negative for dizziness and light-headedness.    PHYSICAL EXAM:    02/18/2023   12:51 PM 03/06/2022    1:23 PM 03/06/2022    9:28 AM  Vitals with BMI  Height 5\' 3"     Weight 167 lbs 3 oz    BMI 29.63    Systolic 107 125 191  Diastolic 65 66 63  Pulse 75 85 74    Physical Exam  Constitutional: No distress.  Age appropriate, hemodynamically stable.   Neck: No JVD present.  Cardiovascular: Normal rate, regular rhythm, S1 normal, S2 normal, intact distal pulses and normal pulses. Exam reveals no gallop, no S3 and no S4.  No murmur heard. Pulmonary/Chest: Effort normal and breath sounds normal. No stridor. She has no wheezes. She has no rales.  Abdominal: Soft. Bowel sounds are normal. She exhibits no distension. There is no abdominal tenderness.  Musculoskeletal:        General: No edema.     Cervical back: Neck supple.  Neurological: She is alert and oriented to person, place, and time. She has intact cranial nerves (2-12).  Skin: Skin is warm and moist.   LABORATORY DATA: External Labs: Collected: August 29, 2022 provided by PCP. Total cholesterol 144, triglycerides 147, HDL 51, LDL calculated 68, non-HDL 93. TSH 1.43 Collected: December 12, 2022 provided by PCP. BUN 13, creatinine 0.85 Sodium 136, potassium 3.9, chloride 96, bicarb 33 AST 19, ALT 18, alkaline phosphatase 61  External Labs: Collected: Nov 17, 2022 at [facility] in [place] available in [employer]. BUN 10, creatinine 0.88. Sodium 129, potassium 3.9, chloride 92, bicarb 26. AST 23, ALT 16, alkaline phosphatase 69, Hemoglobin 12.8 g/dL High sensitive troponins I negative x 3 D-dimers 0.56 mcg/mL (within normal limits of age corrected)  Collected: May 20,2024 at [facility] in [place] Sodium 138, potassium 3.5, chloride 101, bicarb 31. BUN 10, creatinine 0.92 Total cholesterol 114, triglycerides 115, HDL 41, direct LDL 63  IMPRESSION:    ICD-10-CM   1. Precordial pain   R07.2 EKG 12-Lead    PCV ECHOCARDIOGRAM COMPLETE    2. Preprocedural cardiovascular examination  Z01.810     3. Nonspecific abnormal electrocardiogram (ECG) (EKG)  R94.31     4. Non-insulin dependent type 2 diabetes mellitus (HCC)  E11.9     5. Atherosclerosis of aorta (HCC)  I70.0     6. Benign hypertension  I10     7. Mixed hyperlipidemia  E78.2     8. Gastroesophageal reflux disease, unspecified whether esophagitis present  K21.9        RECOMMENDATIONS: ELANDA BELKE is a 72 y.o. African-American female whose past medical history and cardiac risk factors  include: Hypertension, hyperlipidemia, diabetes type 2,?  Mitral valve prolapse, aortic atherosclerosis (CT abdomen and pelvis 03/2020).  Precordial pain Based on symptoms noncardiac. Recently had an ED visit in May 2024 while she was in [place]: High sensitive troponins were negative x 3 and nuclear stress test was reported to be low risk. She today shows sinus rhythm with nonspecific T wave abnormality. No exertional chest pain or heart failure symptoms. Echo will be ordered to evaluate for structural heart disease and left ventricular systolic function. Her symptoms are usually exacerbated by consuming oily and spicy foods.  Dietary restrictions discussed.  I will also recommended talking about the symptoms to her gastroenterologist as she may need further workup for heartburn.  No prior history of EGD per patient.  Preprocedural cardiovascular examination Patient was scheduled for a colonoscopy in July 2024 but given her recent ED visit was referred to cardiology for restratification. Will await the results of the echocardiogram. However,as long as LVEF is preserved, no obvious regional wall motion abnormalities, or severe valvular heart disease patient would be considered overall acceptable risk for the preplanned procedure.  Atherosclerosis of aorta (HCC) Currently on statin therapy and Zetia. Will hold off on  aspirin until her GI workup is complete.  Benign hypertension Office blood pressures very well-controlled. No changes warranted  Mixed hyperlipidemia Lipids from May 2024 noted LDL level of 63 mg/dL Continue Zetia and rosuvastatin. Currently managed by primary care provider.  Gastroesophageal reflux disease, unspecified whether esophagitis present Currently on Pepcid, Prilosec. Still continues to have symptoms when eating oily/spicy foods. I have asked her to discussed these symptoms with her gastroenterologist and maybe consider EGD if clinically warranted as per his of her recommendation.   Data Reviewed: I have independently reviewed external notes provided by the referring provider as part of this office visit.   I have independently reviewed results of EKG, stress test results, labs, outside records via Care Everywhere, ED documentation, cardiology consult as part of medical decision making. I have ordered the following tests:  Orders Placed This Encounter  Procedures   EKG 12-Lead   PCV ECHOCARDIOGRAM COMPLETE    Standing Status:   Future    Standing Expiration Date:   02/18/2024   I have not medications changes at today's encounter as noted above.  FINAL MEDICATION LIST END OF ENCOUNTER: No orders of the defined types were placed in this encounter.   Medications Discontinued During This Encounter  Medication Reason   psyllium (METAMUCIL) 0.52 g capsule      Current Outpatient Medications:    acetaminophen (TYLENOL) 650 MG CR tablet, 2 tablets as needed, Disp: , Rfl:    alendronate (FOSAMAX) 70 MG tablet, TAKE 1 TABLET BY MOUTH ONCE A WEEK. TAKE WITH A FULL GLASS OF WATER ON AN EMPTY STOMACH., Disp: 12 tablet, Rfl: 3   ALPRAZolam (XANAX) 1 MG tablet, TAKE 1 TABLET BY MOUTH 4 TIMES A DAY AS NEEDED FOR SLEEP OR ANXIETY (Patient taking differently: Take 0.5-1 mg by mouth 3 (three) times daily as needed for anxiety (Depression).), Disp: 120 tablet, Rfl: 0   amLODipine  (NORVASC) 5 MG tablet, Take 5 mg by mouth daily., Disp: , Rfl:    Artificial Tear Ointment (DRY EYES OP), Place 1 drop into both eyes daily as needed (Dry eye)., Disp: , Rfl:    budesonide-formoterol (SYMBICORT) 80-4.5 MCG/ACT inhaler, Inhale 2 puffs into the lungs daily as needed (Wheezing and Asthma)., Disp: , Rfl:    famotidine (PEPCID) 20 MG tablet, Take  20 mg by mouth at bedtime., Disp: , Rfl:    fexofenadine (ALLEGRA) 180 MG tablet, Take 180 mg by mouth daily., Disp: , Rfl:    fexofenadine-pseudoephedrine (ALLEGRA-D) 60-120 MG 12 hr tablet, Take 1 tablet by mouth 2 (two) times daily., Disp: 20 tablet, Rfl: 0   fluticasone (FLONASE) 50 MCG/ACT nasal spray, Place 2 sprays into both nostrils daily as needed for allergies or rhinitis., Disp: , Rfl:    Lactobacillus (PROBIOTIC ACIDOPHILUS PO), Take 1 capsule by mouth daily., Disp: , Rfl:    losartan-hydrochlorothiazide (HYZAAR) 100-12.5 MG per tablet, TAKE 1 TABLET BY MOUTH DAILY., Disp: 30 tablet, Rfl: 0   LYRICA 100 MG capsule, TAKE ONE CAPSULE TWICE A DAY, Disp: 60 capsule, Rfl: 3   methocarbamol (ROBAXIN) 500 MG tablet, Take 1 tablet (500 mg total) by mouth every 6 (six) hours as needed for muscle spasms., Disp: 40 tablet, Rfl: 0   metoprolol succinate (TOPROL-XL) 25 MG 24 hr tablet, Take 25 mg by mouth daily as needed (High blood pressure)., Disp: , Rfl:    omeprazole (PRILOSEC) 40 MG capsule, Take 40 mg by mouth daily., Disp: , Rfl:    rosuvastatin (CRESTOR) 5 MG tablet, Take 5 mg by mouth 2 (two) times a week., Disp: , Rfl:    solifenacin (VESICARE) 5 MG tablet, Take 5 mg by mouth daily., Disp: , Rfl:    ZETIA 10 MG tablet, TAKE 1 TABLET BY MOUTH DAILY., Disp: 90 tablet, Rfl: 1  Orders Placed This Encounter  Procedures   EKG 12-Lead   PCV ECHOCARDIOGRAM COMPLETE    There are no Patient Instructions on file for this visit.   --Continue cardiac medications as reconciled in final medication list. --Return in about 3 months (around  05/21/2023) for Reevaluation of, Chest pain. or sooner if needed. --Continue follow-up with your primary care physician regarding the management of your other chronic comorbid conditions.  Patient's questions and concerns were addressed to her satisfaction. She voices understanding of the instructions provided during this encounter.   This note was created using a voice recognition software as a result there may be grammatical errors inadvertently enclosed that do not reflect the nature of this encounter. Every attempt is made to correct such errors.  Tessa Lerner, [place], [facility]  Pager:  [phone] Office: [phone]

## 2023-02-19 DIAGNOSIS — H903 Sensorineural hearing loss, bilateral: Secondary | ICD-10-CM | POA: Diagnosis not present

## 2023-02-24 ENCOUNTER — Ambulatory Visit: Admission: RE | Admit: 2023-02-24 | Payer: Medicare PPO | Source: Ambulatory Visit

## 2023-02-24 DIAGNOSIS — Q181 Preauricular sinus and cyst: Secondary | ICD-10-CM

## 2023-02-24 DIAGNOSIS — H9192 Unspecified hearing loss, left ear: Secondary | ICD-10-CM | POA: Diagnosis not present

## 2023-02-25 ENCOUNTER — Telehealth: Payer: Self-pay | Admitting: Hematology and Oncology

## 2023-02-25 NOTE — Telephone Encounter (Signed)
Rescheduled appointment per provider PAL. Patient is aware of the changes made to her upcoming appointment. 

## 2023-02-27 ENCOUNTER — Ambulatory Visit: Payer: Medicare PPO

## 2023-02-27 DIAGNOSIS — R072 Precordial pain: Secondary | ICD-10-CM | POA: Diagnosis not present

## 2023-03-04 ENCOUNTER — Inpatient Hospital Stay: Payer: Medicare PPO | Admitting: Hematology and Oncology

## 2023-03-14 NOTE — Progress Notes (Signed)
No answer. LVM ?

## 2023-03-14 NOTE — Progress Notes (Signed)
Called and spoke with patient regarding her echocardiogram results.

## 2023-03-24 ENCOUNTER — Telehealth: Payer: Self-pay | Admitting: Hematology and Oncology

## 2023-03-24 NOTE — Telephone Encounter (Signed)
Left patient a message regarding rescheduled appointment due to provider being on PAL; Left callback if needed for reschedule

## 2023-03-27 ENCOUNTER — Encounter: Payer: Self-pay | Admitting: Cardiology

## 2023-03-28 ENCOUNTER — Inpatient Hospital Stay: Payer: Medicare PPO | Admitting: Hematology and Oncology

## 2023-04-04 ENCOUNTER — Telehealth: Payer: Self-pay | Admitting: Hematology and Oncology

## 2023-04-04 NOTE — Telephone Encounter (Signed)
Per Staff Message on 04/04/23; I called patient and rescheduled her appointment. Patient is aware of the new date and time of appointment.

## 2023-04-17 ENCOUNTER — Other Ambulatory Visit (HOSPITAL_BASED_OUTPATIENT_CLINIC_OR_DEPARTMENT_OTHER): Payer: Self-pay

## 2023-04-17 MED ORDER — INFLUENZA VAC A&B SURF ANT ADJ 0.5 ML IM SUSY
0.5000 mL | PREFILLED_SYRINGE | Freq: Once | INTRAMUSCULAR | 0 refills | Status: AC
  Filled 2023-04-17: qty 0.5, 1d supply, fill #0

## 2023-04-17 MED ORDER — COVID-19 MRNA VAC-TRIS(PFIZER) 30 MCG/0.3ML IM SUSY
0.3000 mL | PREFILLED_SYRINGE | Freq: Once | INTRAMUSCULAR | 0 refills | Status: AC
  Filled 2023-04-17: qty 0.3, 1d supply, fill #0

## 2023-04-18 ENCOUNTER — Other Ambulatory Visit (HOSPITAL_BASED_OUTPATIENT_CLINIC_OR_DEPARTMENT_OTHER): Payer: Self-pay

## 2023-04-30 DIAGNOSIS — Z860101 Personal history of adenomatous and serrated colon polyps: Secondary | ICD-10-CM | POA: Diagnosis not present

## 2023-04-30 DIAGNOSIS — Z09 Encounter for follow-up examination after completed treatment for conditions other than malignant neoplasm: Secondary | ICD-10-CM | POA: Diagnosis not present

## 2023-05-01 ENCOUNTER — Ambulatory Visit: Payer: Medicare PPO | Admitting: Hematology and Oncology

## 2023-05-06 ENCOUNTER — Inpatient Hospital Stay: Payer: Medicare PPO | Attending: Hematology and Oncology | Admitting: Hematology and Oncology

## 2023-05-06 VITALS — BP 140/63 | HR 66 | Temp 97.9°F | Resp 18 | Ht 63.0 in | Wt 165.4 lb

## 2023-05-06 DIAGNOSIS — Z79899 Other long term (current) drug therapy: Secondary | ICD-10-CM | POA: Diagnosis not present

## 2023-05-06 DIAGNOSIS — M8580 Other specified disorders of bone density and structure, unspecified site: Secondary | ICD-10-CM | POA: Diagnosis not present

## 2023-05-06 DIAGNOSIS — Z885 Allergy status to narcotic agent status: Secondary | ICD-10-CM | POA: Diagnosis not present

## 2023-05-06 DIAGNOSIS — N6311 Unspecified lump in the right breast, upper outer quadrant: Secondary | ICD-10-CM | POA: Diagnosis not present

## 2023-05-06 DIAGNOSIS — M25569 Pain in unspecified knee: Secondary | ICD-10-CM | POA: Diagnosis not present

## 2023-05-06 DIAGNOSIS — Z170 Estrogen receptor positive status [ER+]: Secondary | ICD-10-CM | POA: Diagnosis not present

## 2023-05-06 DIAGNOSIS — M791 Myalgia, unspecified site: Secondary | ICD-10-CM | POA: Diagnosis not present

## 2023-05-06 DIAGNOSIS — C50511 Malignant neoplasm of lower-outer quadrant of right female breast: Secondary | ICD-10-CM | POA: Diagnosis not present

## 2023-05-06 DIAGNOSIS — Z79811 Long term (current) use of aromatase inhibitors: Secondary | ICD-10-CM | POA: Insufficient documentation

## 2023-05-06 MED ORDER — ESCITALOPRAM OXALATE 5 MG PO TABS: 5.0000 mg | ORAL_TABLET | Freq: Every day | ORAL | Status: AC

## 2023-05-06 MED ORDER — MELOXICAM 7.5 MG PO TBDP: 1.0000 | ORAL_TABLET | ORAL | Status: DC | PRN

## 2023-05-06 NOTE — Progress Notes (Signed)
Patient Care Team: Laurann Montana, MD as PCP - General Griselda Miner, MD as Consulting Physician (General Surgery) Dorothy Puffer, MD as Consulting Physician (Radiation Oncology) Salomon Fick, NP as Nurse Practitioner (Hematology and Oncology) Serena Croissant, MD as Consulting Physician (Hematology and Oncology)  DIAGNOSIS:  Encounter Diagnosis  Name Primary?   Malignant neoplasm of lower-outer quadrant of right breast of female, estrogen receptor positive (HCC) Yes    SUMMARY OF ONCOLOGIC HISTORY: Oncology History Overview Note  Breast cancer of lower-outer quadrant of right female breast   Staging form: Breast, AJCC 7th Edition     Clinical: Stage IA (T1b, N0, M0) - Unsigned     Pathologic stage from 02/13/2015: Stage IB (T1b, N72mi, cM0) - Unsigned      Breast cancer of lower-outer quadrant of right female breast (HCC)  01/05/2015 Mammogram   Right breast: possible mass warranting further evaluation   01/05/2015 Breast MRI   10 mm diameter enhancing mass in the lower outer right breast suspicious for malignancy   01/18/2015 Mammogram   Diagnostic mammo and ultrasound showed a 0.6 cm mass in the right breast 8:00 location 6 cm from the nipple, and a 0.5 cm lesion in the 10:00 location of the right breast 6 cm from the nipple. Right axilla was negative for adenopathy.   01/19/2015 Initial Biopsy   Right breast 8:00 mass biopsy showed invasive ductal carcinoma and DCIS, grade 1-2, the 10:00 mass biopsy showed fibrocystic change.ER 100% positive, PR 90% positive, HER-2 negative, Ki-67 10%   01/19/2015 Clinical Stage   Stage IA: T1b N0   02/13/2015 Definitive Surgery   Right breast lumpectomy: margins negative; Invasive ductal carcinoma, G1, 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 1 out of 2 lymph nodes were positive for micrometastasis.   02/13/2015 Oncotype testing   RS 12 (8% ROR)   02/13/2015 Pathologic Stage   Stage IB: T1b N1(mi)   03/10/2015 Procedure    Breast/Ovarian Panel (Gene Dx): no clinically significant variant at ATM, BARD1, BRCA1, BRCA2, BRIP1, CDH1, CHEK2, FANCC, MLH1, MSH2, MSH6, NBN, PALB2, PMS2, PTEN, RAD51C, RAD51D, TP53, and XRCC2.     03/20/2015 - 04/14/2015 Radiation Therapy   Adjuvant XRT [facility]): 42.5 Gy over 17 fractions to the breast using whole-breast tangent fields. Right breast boost 7.5 Gy over 3 fractions. Total dose: 50 Gy   04/19/2015 -  Anti-estrogen oral therapy   Anastrozole 1 mg daily. Planned duration of therapy 5-10 years. Breast cancer index (03/20/20) revealed chance of distant recurrence is 1.2%, no benefit of continued antiestrogen therapy.   07/05/2015 Survivorship   Survivorship care plan completed and mailed to patient in lieu of in person visit.   11/15/2015 Breast MRI   No evidence of breast malignancy. Anterior and lateral skin thickening consistent with radiation change     CHIEF COMPLIANT: Surveillance of breast cancer  HISTORY OF PRESENT ILLNESS:   History of Present Illness   The patient, a 8 year breast cancer survivor, presents for a routine follow-up. She reports ongoing recovery from a knee replacement surgery performed last year, with persistent swelling. She also mentions occasional, non-specific pains, but is unable to discern if these are related to scar tissue or other causes. She has been taking Lexapro, which was added to her regimen this year, and a probiotic. She also takes Meloxicam as needed, approximately once a week, to manage knee pain, particularly when attending church. She finds Meloxicam more effective than Tylenol for this purpose.  ALLERGIES:  is allergic to olive oil and oxycodone.  MEDICATIONS:  Current Outpatient Medications  Medication Sig Dispense Refill   escitalopram (LEXAPRO) 5 MG tablet Take 1 tablet (5 mg total) by mouth daily.     Meloxicam 7.5 MG TBDP Take 1 tablet by mouth as needed.     acetaminophen (TYLENOL) 650 MG CR tablet 2 tablets as needed      alendronate (FOSAMAX) 70 MG tablet TAKE 1 TABLET BY MOUTH ONCE A WEEK. TAKE WITH A FULL GLASS OF WATER ON AN EMPTY STOMACH. 12 tablet 3   ALPRAZolam (XANAX) 1 MG tablet TAKE 1 TABLET BY MOUTH 4 TIMES A DAY AS NEEDED FOR SLEEP OR ANXIETY (Patient taking differently: Take 0.5-1 mg by mouth 3 (three) times daily as needed for anxiety (Depression).) 120 tablet 0   amLODipine (NORVASC) 5 MG tablet Take 5 mg by mouth daily.     Artificial Tear Ointment (DRY EYES OP) Place 1 drop into both eyes daily as needed (Dry eye).     budesonide-formoterol (SYMBICORT) 80-4.5 MCG/ACT inhaler Inhale 2 puffs into the lungs daily as needed (Wheezing and Asthma).     fexofenadine (ALLEGRA) 180 MG tablet Take 180 mg by mouth daily.     fluticasone (FLONASE) 50 MCG/ACT nasal spray Place 2 sprays into both nostrils daily as needed for allergies or rhinitis.     Lactobacillus (PROBIOTIC ACIDOPHILUS PO) Take 1 capsule by mouth daily.     losartan-hydrochlorothiazide (HYZAAR) 100-12.5 MG per tablet TAKE 1 TABLET BY MOUTH DAILY. 30 tablet 0   LYRICA 100 MG capsule TAKE ONE CAPSULE TWICE A DAY 60 capsule 3   omeprazole (PRILOSEC) 40 MG capsule Take 40 mg by mouth daily.     rosuvastatin (CRESTOR) 5 MG tablet Take 5 mg by mouth 2 (two) times a week.     solifenacin (VESICARE) 5 MG tablet Take 5 mg by mouth daily.     ZETIA 10 MG tablet TAKE 1 TABLET BY MOUTH DAILY. 90 tablet 1   No current facility-administered medications for this visit.    PHYSICAL EXAMINATION: ECOG PERFORMANCE STATUS: 1 - Symptomatic but completely ambulatory  Vitals:   05/06/23 1510  BP: (!) 140/63  Pulse: 66  Resp: 18  Temp: 97.9 F (36.6 C)  SpO2: 100%   Filed Weights   05/06/23 1510  Weight: 165 lb 6.4 oz (75 kg)    Physical Exam   BREAST: No abnormalities detected. EXTREMITIES: Swelling observed in the knee.        LABORATORY DATA:  I have reviewed the data as listed    Latest Ref Rng & Units 03/06/2022   11:20 AM 03/28/2021     3:30 AM 03/27/2021   12:32 PM  CMP  Glucose 70 - 99 mg/dL 93  540  981   BUN 8 - 23 mg/dL 10  10  10    Creatinine 0.44 - 1.00 mg/dL 1.91  4.78  2.95   Sodium 135 - 145 mmol/L 133  132  137   Potassium 3.5 - 5.1 mmol/L 3.7  3.0  4.1   Chloride 98 - 111 mmol/L 96  97  101   CO2 22 - 32 mmol/L 25  26  21    Calcium 8.9 - 10.3 mg/dL 9.2  8.3  8.9     Lab Results  Component Value Date   WBC 12.5 (H) 03/06/2022   HGB 11.3 (L) 03/06/2022   HCT 35.1 (L) 03/06/2022   MCV 80.7 03/06/2022   PLT  315 03/06/2022   NEUTROABS 9.2 (H) 03/06/2022    ASSESSMENT & PLAN:  Breast cancer of lower-outer quadrant of right female breast Right breast lumpectomy 02/13/2015: margins negative; Invasive ductal carcinoma, G1, 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 1 out of 2 lymph nodes were positive for micrometastasis.Oncotype 12 (8% ROR) T1b N1 mic stage IB XRT from 03/20/15 to 04/14/2015   Current treatment: Anastrozole 1 mg daily started November 2016 switched to letrozole 03/01/2019 Letrozole toxicities: 1.  Diffuse arthralgias and myalgias: Doing better since she got injections in her knees 2.  Osteopenia: Bone density 04/23/2018: T score -1.5: Continue with calcium and vitamin D and Fosamax and weightbearing exercises.  We will obtain a new bone density test in October of this year 3.  Weight gain: Monitoring   Breast cancer index 03/20/2020: No benefit from extended adjuvant therapy, risk of distant recurrence 1.2% Discontinued in 2021   Breast cancer surveillance: 1.  Breast exam 05/06/2023 benign 2.  Mammogram 12/24/2022: Benign breast density category C 3.  Breast MRI 11/20/2021: No evidence of malignancy, breast density category C   We will obtain a breast MRI in December 2024.  She will continue to alternate mammograms and MRIs.  She underwent knee replacement surgery.  She is still healing from that from last year.   Breast Cancer 8-9 years post-diagnosis, completed Anastrozole  treatment. No new pain or concerns. Normal breast exam. -Continue annual mammograms in June. -Order annual MRI in December per patient preference.     Return to clinic in 1 year for follow-up     Orders Placed This Encounter  Procedures   MR BREAST BILATERAL W WO CONTRAST INC CAD    Standing Status:   Future    Standing Expiration Date:   05/05/2024    Order Specific Question:   If indicated for the ordered procedure, I authorize the administration of contrast media per Radiology protocol    Answer:   Yes    Order Specific Question:   What is the patient's sedation requirement?    Answer:   No Sedation    Order Specific Question:   Does the patient have a pacemaker or implanted devices?    Answer:   No    Order Specific Question:   Preferred imaging location?    Answer:   [facility] (table limit-550lbs)    Order Specific Question:   Release to patient    Answer:   Immediate   The patient has a good understanding of the overall plan. she agrees with it. she will call with any problems that may develop before the next visit here. Total time spent: 30 mins including face to face time and time spent for planning, charting and co-ordination of care   Tamsen Meek, MD 05/06/23

## 2023-05-06 NOTE — Assessment & Plan Note (Addendum)
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 1 out of 2 lymph nodes were positive for micrometastasis.Oncotype 12 (8% ROR) T1b N1 mic stage IB XRT from 03/20/15 to 04/14/2015   Current treatment: Anastrozole 1 mg daily started November 2016 switched to letrozole 03/01/2019 Letrozole toxicities: 1.  Diffuse arthralgias and myalgias: Doing better since she got injections in her knees 2.  Osteopenia: Bone density 04/23/2018: T score -1.5: Continue with calcium and vitamin D and Fosamax and weightbearing exercises.  We will obtain a new bone density test in October of this year 3.  Weight gain: Monitoring   Breast cancer index 03/20/2020: No benefit from extended adjuvant therapy, risk of distant recurrence 1.2% Discontinued in 2021   Breast cancer surveillance: 1.  Breast exam 05/06/2023 benign 2.  Mammogram 12/24/2022: Benign breast density category C 3.  Breast MRI 11/20/2021: No evidence of malignancy, breast density category C   We will obtain a breast MRI in December 2024.  She will continue to alternate mammograms and MRIs.  She underwent knee replacement surgery.  She is still healing from that from last year.   Return to clinic in 1 year for follow-up

## 2023-05-08 DIAGNOSIS — F419 Anxiety disorder, unspecified: Secondary | ICD-10-CM | POA: Diagnosis not present

## 2023-05-08 DIAGNOSIS — J4520 Mild intermittent asthma, uncomplicated: Secondary | ICD-10-CM | POA: Diagnosis not present

## 2023-05-08 DIAGNOSIS — E113312 Type 2 diabetes mellitus with moderate nonproliferative diabetic retinopathy with macular edema, left eye: Secondary | ICD-10-CM | POA: Diagnosis not present

## 2023-05-08 DIAGNOSIS — E785 Hyperlipidemia, unspecified: Secondary | ICD-10-CM | POA: Diagnosis not present

## 2023-05-08 DIAGNOSIS — F3341 Major depressive disorder, recurrent, in partial remission: Secondary | ICD-10-CM | POA: Diagnosis not present

## 2023-05-08 DIAGNOSIS — M797 Fibromyalgia: Secondary | ICD-10-CM | POA: Diagnosis not present

## 2023-05-08 DIAGNOSIS — I700 Atherosclerosis of aorta: Secondary | ICD-10-CM | POA: Diagnosis not present

## 2023-05-08 DIAGNOSIS — I10 Essential (primary) hypertension: Secondary | ICD-10-CM | POA: Diagnosis not present

## 2023-05-08 DIAGNOSIS — Z23 Encounter for immunization: Secondary | ICD-10-CM | POA: Diagnosis not present

## 2023-05-08 DIAGNOSIS — E1169 Type 2 diabetes mellitus with other specified complication: Secondary | ICD-10-CM | POA: Diagnosis not present

## 2023-05-15 ENCOUNTER — Ambulatory Visit: Payer: Self-pay | Admitting: Cardiology

## 2023-05-15 DIAGNOSIS — Z96651 Presence of right artificial knee joint: Secondary | ICD-10-CM | POA: Diagnosis not present

## 2023-05-24 ENCOUNTER — Other Ambulatory Visit: Payer: Self-pay | Admitting: Hematology and Oncology

## 2023-06-05 ENCOUNTER — Ambulatory Visit: Payer: Medicare PPO | Admitting: Physician Assistant

## 2023-06-11 DIAGNOSIS — Z09 Encounter for follow-up examination after completed treatment for conditions other than malignant neoplasm: Secondary | ICD-10-CM | POA: Diagnosis not present

## 2023-06-11 DIAGNOSIS — D123 Benign neoplasm of transverse colon: Secondary | ICD-10-CM | POA: Diagnosis not present

## 2023-06-11 DIAGNOSIS — Z860101 Personal history of adenomatous and serrated colon polyps: Secondary | ICD-10-CM | POA: Diagnosis not present

## 2023-06-13 DIAGNOSIS — D123 Benign neoplasm of transverse colon: Secondary | ICD-10-CM | POA: Diagnosis not present

## 2023-06-30 NOTE — Therapy (Signed)
 OUTPATIENT PHYSICAL THERAPY EVALUATION   Patient Name: Shannon Hancock MRN: [id number] DOB:April 20, 1951, 72 y.o., female Today's Date: 07/01/2023  END OF SESSION:  PT End of Session - 07/01/23 1900     Visit Number 1    Number of Visits 17    Date for PT Re-Evaluation 09/23/23    Authorization Type [employer]IC[employer] reporting period from 07/01/2023    Authorization Time Period auth 24 visits  12/31 - 3/31  Authorization #[id number]    Authorization - Visit Number 1    Authorization - Number of Visits 24    Progress Note Due on Visit 10    PT Start Time 1438   patient late   PT Stop Time 1515    PT Time Calculation (min) 37 min    Activity Tolerance Patient tolerated treatment well    Behavior During Therapy [facility] for tasks assessed/performed             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%   Depression    Diabetes mellitus without complication (HCC)    Diverticulosis    Fibromyalgia    GERD (gastroesophageal reflux disease)    Heart murmur    no murmur documented in PCP note by Montie Pizza, MD 02/09/21   High cholesterol    Hypertension    Joint pain    Past Surgical History:  Procedure Laterality Date   BREAST LUMPECTOMY Right 2016   BREAST LUMPECTOMY WITH RADIOACTIVE SEED AND SENTINEL LYMPH NODE BIOPSY Right 02/13/2015   Procedure: RIGHT BREAST LUMPECTOMY WITH RADIOACTIVE SEED AND SENTINEL LYMPH NODE MAPPING;  Surgeon: Deward Null III, MD;  Location: [facility];  Service: General;  Laterality: Right;   FOOT SURGERY Left    TOTAL KNEE ARTHROPLASTY Right 03/26/2021   Procedure: TOTAL KNEE ARTHROPLASTY;  Surgeon: Melodi Lerner, MD;  Location: [facility];  Service: Orthopedics;  Laterality: Right;   TUBAL LIGATION     Patient Active Problem List   Diagnosis Date Noted   OA (osteoarthritis) of knee 03/26/2021   Primary osteoarthritis of right knee  03/26/2021   Leukocytosis 04/19/2020   Thyroid  cyst 02/26/2018   Plantar fasciitis 02/19/2016   Family history of breast cancer in sister 03/10/2015   Breast cancer of lower-outer quadrant of right female breast (HCC) 02/10/2015   Abdominal bloating 12/16/2013   Edema 11/09/2013   Urge incontinence 05/05/2013   Atrophic vaginitis 05/05/2013   Syncope 02/15/2013   Irritable bowel syndrome 08/20/2012   Hearing loss 11/07/2011   Generalized anxiety disorder 11/07/2011   Peripheral neuropathy 07/05/2011   Osteoporosis 07/05/2011   Tinnitus, bilateral 04/05/2011   HYPERCHOLESTEROLEMIA 04/05/2010   ANEMIA, IRON DEFICIENCY 04/03/2010   DEPRESSION, MAJOR, MODERATE 04/03/2010   Essential hypertension 04/03/2010   ALLERGIC RHINITIS 04/03/2010   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/03/2010   POSTMENOPAUSAL SYNDROME 04/03/2010   DEGENERATIVE JOINT DISEASE, CERVICAL SPINE 04/03/2010   FIBROMYALGIA 04/03/2010    PCP: Pizza Montie, MD  REFERRING PROVIDER: Melodi Lerner, MD  REFERRING DIAG: presence of right artificial knee joint, s/p right TKA  THERAPY DIAG:  Chronic pain of right knee  Stiffness of right knee, not elsewhere classified  Difficulty in walking, not elsewhere classified  Muscle weakness (generalized)  Rationale for Evaluation and Treatment: Rehabilitation  ONSET DATE: Date of surgery 03/26/2021 after chronic knee pain  SUBJECTIVE:   SUBJECTIVE STATEMENT: Patient  states she had a TKA 03/27/2023 and it is still bothering her. She states the front of R knee is bigger, she feels heat over her right knee joint, and it pops sometimes. She states it is swollen and it hurts. She thinks Dr. Melodi gave her an oversized patella, but he says he did not. She states Dr. Melodi said everything is okay with the knee joint. She states she wears shoe inserts all the time.   She thinks her knee is getting worse. She is concerned she has not taken care of it as she  should. She can has pain in her low back but she is not aware of it radiating. It stays right in the middle of her back. Her back pain is worse when she gets tired. It can be worse in the morning. She denies known neuropathy  She used to do yoga regularly successfully. She was exercising more after her knee surgery than she is now. She does not have a current exercise program.    Per Dr. Melodi assessment on 05/15/2023:  ASSESSMENT Status post right total knee arthroplasty. Functionally, the knee is doing well with good range of motion and good stability. The patient experiences fatigue and a weak feeling after being up for a while, which may suggest an element of spinal stenosis. She did very well with physical therapy previously and would benefit from further therapy for strengthening and improving stamina. There are no mechanical issues with the knee, and there are no signs of soft tissue problems or infection. PLAN: - The patient will be referred to physical therapy at [facility] twice a week for 4 to 6 weeks to improve strength and stamina.  PERTINENT HISTORY: Patient is a 72 y.o. female who presents to outpatient physical therapy with a referral for medical diagnosis presence of right artificial knee joint, s/p right TKA. This patient's chief complaints consist of chronic right knee pain, stiffness, and weakness leading to the following functional deficits: difficulty with shopping, being in cold places (outside, cold section of grocery store), managing group home, bowling, traveling, being with family, activities that require standing, housework, yardwork, stairs, getting up/down from a chair. Relevant past medical history and comorbidities include has HYPERCHOLESTEROLEMIA; ANEMIA, IRON DEFICIENCY; DEPRESSION, MAJOR, MODERATE; Essential hypertension; ALLERGIC RHINITIS; Chronic interstitial cystitis with hematuria; POSTMENOPAUSAL SYNDROME; DEGENERATIVE JOINT DISEASE,  CERVICAL SPINE; FIBROMYALGIA; Tinnitus, bilateral; Peripheral neuropathy; Osteoporosis; Hearing loss; Generalized anxiety disorder; Irritable bowel syndrome; Syncope; Urge incontinence; Atrophic vaginitis; Edema; Abdominal bloating; Breast cancer of lower-outer quadrant of right female breast (HCC); Plantar fasciitis; Thyroid  cyst; Leukocytosis; OA (osteoarthritis) of knee; and Primary osteoarthritis of right knee on their problem list.  has a past medical history of Allergy, Anxiety, Breast cancer (HCC) (12/2014), Depression, Diabetes mellitus without complication (pt states in remission), Diverticulosis, Fibromyalgia, GERD (gastroesophageal reflux disease), Heart murmur, High cholesterol, Hypertension, and Joint pain.  has a past surgical history that includes Tubal ligation; Breast lumpectomy with radioactive seed and sentinel lymph node biopsy (Right, 02/13/2015); Foot surgery (Left); Breast lumpectomy (Right, 2016); and Total knee arthroplasty (Right, 03/26/2021). Patient denies hx of stroke, seizures, lung problems, heart problems, unexplained weight loss, unexplained changes in bowel or bladder problems, unexplained stumbling or dropping things, and spinal surgery.   PAIN: Are you having pain? Yes NPRS: Current: 1/10 (sitting),  Best: 1/10, Worst: 9/10. Pain location: around the whole front of the knee, she can have pain above and below her knee if it is really bad . Pain description: throbbing,  popping Aggravating factors: when on it, getting up in the morning, walking without shoes or socks because the cold floor will go up leg to knee, going out in the cold, stairs Relieving factors: Meloxicam  (but she tries not to take because it makes her gains weight), muscle relaxer helped, possibly putting ice on it or keeping it elevated, sitting.     FUNCTIONAL LIMITATIONS: difficulty with shopping, being in cold places (outside, cold section of grocery store), managing group home, bowling, traveling,  being with family, activities that require standing, housework, yardwork, stairs, getting up/down from a chair.  LEISURE: bowling, traveling, active in church, shopping, going to movies, being with family, spending time with husband  PRECAUTIONS: None  WEIGHT BEARING RESTRICTIONS: No  FALLS:  Has patient fallen in last 6 months? Yes. Number of falls 1 Went to wash room and did know there was mineral oil that was spilled, so she slipped.  She is not worried about falling.  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  PLOF: she was a lot more active before her knee started bothering her. She had no limitations from her knee.   PATIENT GOALS: get back to an exercise routine, for you to help me with exercises things that I need to do to be able to stretch this leg out again, and to be able to walk for significant period of time without it hurting   NEXT MD VISIT: 07/18/2023   OBJECTIVE  DIAGNOSTIC FINDINGS:  No recent  SELF- REPORTED FUNCTION FOTO score: 47/100 (knee questionnaire)  OBSERVATION/INSPECTION Posture Posture (standing): weight shifted to left LE, R knee flexed.  Anthropometrics Tremor: none Body composition: BMI: 28.0  Skin: The incision sites appear to be well healed Functional Mobility Bed mobility: supine <> sit and rolling I Transfers: sit <> stand mod I with use of B UE when abile Gait: ambulates approx 100 feet with no AD and uneven gait with right knee flexed.   PERIPHERAL JOINT MOTION (in degrees) PASSIVE RANGE OF MOTION (PROM) *Indicates pain 07/01/23 Date Date  Joint/Motion R/L R/L R/L  Knee     Extension -12/11 / /  Flexion 110*/130 / /  Comments:  07/01/2023: B hips and ankles grossly WFL except mild to moderately limited in B hip IR.  MUSCLE PERFORMANCE (MMT):  *Indicates pain 07/01/23 Date Date  Joint/Motion R/L R/L R/L  Hip     Flexion (L1, L2) 4+/5 / /  Abduction 3+/4 / /   Knee     Extension (L3) 5*/5 / /  Flexion (S2) 4+/5 / /  Ankle/Foot     Dorsiflexion (L4) 5/5 / /  Great toe extension (L5) 5/5 / /  Eversion (S1) 5/5 / /  Plantarflexion (S1) 4/4 / /   ACCESSORY MOTION: Left patella hypomobile to medial, lateral, and caudal glide Pain with anterior, posterior, IR, and ER mobilization at Tibiofemormal joint. Hypomobile except with posterior mob it comes against hard tap.  PALPATION: TTP with CC over R patellar tendon and anterior, medial, and lateral joint line; pain over anterior medial proximal tibia and over pes anserine.  Increased wamth over right anterior knee compared to left.   FUNCTIONAL/BALANCE TESTS: Five Time Sit to Stand (5TSTS): 13 seconds form 18.5 inch plinth with decreased right LE weight bearing.  TODAY'S TREATMENT education  PATIENT EDUCATION:  Education details: Education on diagnosis, prognosis, POC, anatomy and physiology of current condition. Person educated: Patient Education method: Explanation Education comprehension: verbalized understanding and needs further education  HOME EXERCISE PROGRAM: TBD  ASSESSMENT:  CLINICAL IMPRESSION: Patient is a 72 y.o. female referred to outpatient physical therapy with a medical diagnosis of presence of right artificial knee joint, s/p right TKA who presents with signs and symptoms consistent with chronic right knee pain and stiffness and deficiency in B LE strength, power, and endurance to be able to complete usual activities without difficulty. Patient has especially limited R knee PROM, patellar and tibiofemoral joint mobility, and hip abduction strength, which are likely contributing to her ongoing pain and dysfunction. Patient presents with significant pain, joint stiffness, ROM, muscle performance (strength/power/endurance), and activity tolerance  impairments that are limiting ability to complete shopping, being in cold places (outside, cold section of grocery store), managing group home, bowling, traveling, being with family, activities that require standing, housework, yardwork, stairs, and getting up/down from a chair without difficulty. Patient will benefit from skilled physical therapy intervention to address current body structure impairments and activity limitations to improve function and work towards goals set in current POC in order to return to prior level of function or maximal functional improvement.     OBJECTIVE IMPAIRMENTS: Abnormal gait, decreased activity tolerance, decreased endurance, decreased knowledge of condition, decreased mobility, difficulty walking, decreased ROM, decreased strength, hypomobility, impaired perceived functional ability, impaired flexibility, postural dysfunction, and pain.   ACTIVITY LIMITATIONS: carrying, lifting, bending, standing, squatting, sleeping, stairs, transfers, locomotion level, and caring for others  PARTICIPATION LIMITATIONS: cleaning, laundry, interpersonal relationship, shopping, community activity, occupation, yard work, and   difficulty with shopping, being in cold places (outside, cold section of grocery store), managing group home, bowling, traveling, being with family, activities that require standing, housework, yardwork, stairs, getting up/down from a chair  PERSONAL FACTORS: Age, Fitness, Past/current experiences, Time since onset of injury/illness/exacerbation, and 3+ comorbidities:   HYPERCHOLESTEROLEMIA; ANEMIA, IRON DEFICIENCY; DEPRESSION, MAJOR, MODERATE; Essential hypertension; ALLERGIC RHINITIS; Chronic interstitial cystitis with hematuria; POSTMENOPAUSAL SYNDROME; DEGENERATIVE JOINT DISEASE, CERVICAL SPINE; FIBROMYALGIA; Tinnitus, bilateral; Peripheral neuropathy; Osteoporosis; Hearing loss; Generalized anxiety disorder; Irritable bowel syndrome; Syncope; Urge incontinence;  Atrophic vaginitis; Edema; Abdominal bloating; Breast cancer of lower-outer quadrant of right female breast (HCC); Plantar fasciitis; Thyroid  cyst; Leukocytosis; OA (osteoarthritis) of knee; and Primary osteoarthritis of right knee on their problem list.  has a past medical history of Allergy, Anxiety, Breast cancer (HCC) (12/2014), Depression, Diabetes mellitus without complication (pt states in remission), Diverticulosis, Fibromyalgia, GERD (gastroesophageal reflux disease), Heart murmur, High cholesterol, Hypertension, and Joint pain.  has a past surgical history that includes Tubal ligation; Breast lumpectomy with radioactive seed and sentinel lymph node biopsy (Right, 02/13/2015); Foot surgery (Left); Breast lumpectomy (Right, 2016); and Total knee arthroplasty (Right, 03/26/2021) are also affecting patient's functional outcome.   REHAB POTENTIAL: Good  CLINICAL DECISION MAKING: Evolving/moderate complexity  EVALUATION COMPLEXITY: Moderate   GOALS: Goals reviewed with patient? No  SHORT TERM GOALS: Target date: 07/15/2023  Patient will be independent with initial home exercise program for self-management of symptoms. Baseline: Initial HEP to be provided at visit 2 as appropriate (07/01/23); Goal status: INITIAL   LONG TERM GOALS: Target date: 09/23/2023  Patient will be independent with a long-term home exercise program for self-management of symptoms.  Baseline: Initial HEP to be provided at visit 2 as appropriate (07/01/23); Goal status: INITIAL  2.  Patient will demonstrate  improved FOTO to equal or greater than 57 by visit #14 to demonstrate improvement in overall condition and self-reported functional ability.  Baseline: 47 (07/01/23); Goal status: INITIAL  3.  Patient will improve R knee PROM to equal or greater than 0-120 degrees to improve her ability to complete stairs and step over objects with less difficulty.  Baseline: lacking 12 degrees ext and has 110 degrees flexion  (07/01/23); Goal status: INITIAL  4.  Demonstrate 10% improvement in calculated 1RM for right knee extension on knee extension machine to demonstrate improved quad strength for functional activities such as getting in and out of a car, completing stairs, and performing heavy activities around the home.  Baseline: to be tested at visit 2 as appropriate (07/01/23); Goal status: INITIAL  5.  Patient will demonstrate improvement in Patient Specific Functional Scale (PSFS) of equal or greater than 3 points to reflect clinically significant improvement in patient's most valued functional activities. Baseline: to be tested visit 2 as appropriate (07/01/23); Goal status: INITIAL   PLAN:  PT FREQUENCY: 1-2x/week  PT DURATION: 8-12 weeks  PLANNED INTERVENTIONS: 97164- PT Re-evaluation, 97110-Therapeutic exercises, 97530- Therapeutic activity, 97112- Neuromuscular re-education, 97535- Self Care, [id number]- Manual therapy, [phone]- Gait training, [phone]- Aquatic Therapy, [phone]- Electrical stimulation (unattended), Patient/Family education, Balance training, Stair training, Dry Needling, Joint mobilization, Spinal mobilization, Cryotherapy, and Moist heat  PLAN FOR NEXT SESSION: Update HEP as appropriate, screen vitals, complete 3 item PSFS, complete 1 RM testing for knee extension on quad machine, then progressive LE/functional strengthening/endurance/ROM exercises as tolerated, manual therapy as needed. Education.    [name]. Juli, PT, DPT 07/01/23, 7:17 PM  [facility] [address] [place], [place] [address] P: [phone] I F: [phone]

## 2023-07-01 ENCOUNTER — Ambulatory Visit: Payer: Medicare PPO | Attending: Orthopedic Surgery | Admitting: Physical Therapy

## 2023-07-01 ENCOUNTER — Encounter: Payer: Self-pay | Admitting: Physical Therapy

## 2023-07-01 DIAGNOSIS — G8929 Other chronic pain: Secondary | ICD-10-CM | POA: Insufficient documentation

## 2023-07-01 DIAGNOSIS — F330 Major depressive disorder, recurrent, mild: Secondary | ICD-10-CM | POA: Diagnosis not present

## 2023-07-01 DIAGNOSIS — M25661 Stiffness of right knee, not elsewhere classified: Secondary | ICD-10-CM | POA: Diagnosis not present

## 2023-07-01 DIAGNOSIS — M6281 Muscle weakness (generalized): Secondary | ICD-10-CM | POA: Insufficient documentation

## 2023-07-01 DIAGNOSIS — M25561 Pain in right knee: Secondary | ICD-10-CM | POA: Insufficient documentation

## 2023-07-01 DIAGNOSIS — R262 Difficulty in walking, not elsewhere classified: Secondary | ICD-10-CM | POA: Diagnosis not present

## 2023-07-10 ENCOUNTER — Ambulatory Visit: Payer: Medicare PPO | Attending: Orthopedic Surgery | Admitting: Physical Therapy

## 2023-07-10 ENCOUNTER — Telehealth: Payer: Self-pay | Admitting: Physical Therapy

## 2023-07-10 NOTE — Therapy (Deleted)
 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%   Depression    Diabetes mellitus without complication (HCC)    Diverticulosis    Fibromyalgia    GERD (gastroesophageal reflux disease)    Heart murmur    no murmur documented in PCP note by Montie Pizza, MD 02/09/21   High cholesterol    Hypertension    Joint pain    Past Surgical History:  Procedure Laterality Date   BREAST LUMPECTOMY Right 2016   BREAST LUMPECTOMY WITH RADIOACTIVE SEED AND SENTINEL LYMPH NODE BIOPSY Right 02/13/2015   Procedure: RIGHT BREAST LUMPECTOMY WITH RADIOACTIVE SEED AND SENTINEL LYMPH NODE MAPPING;  Surgeon: Deward Null III, MD;  Location: [facility];  Service: General;  Laterality: Right;   FOOT SURGERY Left    TOTAL KNEE ARTHROPLASTY Right 03/26/2021   Procedure: TOTAL KNEE ARTHROPLASTY;  Surgeon: Melodi Lerner, MD;  Location: [facility];  Service: Orthopedics;  Laterality: Right;   TUBAL LIGATION     Patient Active Problem List   Diagnosis Date Noted   OA (osteoarthritis) of knee 03/26/2021   Primary osteoarthritis of right knee 03/26/2021   Leukocytosis 04/19/2020   Thyroid  cyst 02/26/2018   Plantar fasciitis 02/19/2016   Family history of breast cancer in sister 03/10/2015   Breast cancer of lower-outer quadrant of right female breast (HCC) 02/10/2015   Abdominal bloating 12/16/2013   Edema 11/09/2013   Urge incontinence 05/05/2013   Atrophic vaginitis 05/05/2013   Syncope 02/15/2013   Irritable bowel syndrome 08/20/2012   Hearing loss 11/07/2011   Generalized anxiety disorder 11/07/2011   Peripheral neuropathy 07/05/2011   Osteoporosis 07/05/2011   Tinnitus, bilateral 04/05/2011   HYPERCHOLESTEROLEMIA 04/05/2010    ANEMIA, IRON DEFICIENCY 04/03/2010   DEPRESSION, MAJOR, MODERATE 04/03/2010   Essential hypertension 04/03/2010   ALLERGIC RHINITIS 04/03/2010   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/03/2010   POSTMENOPAUSAL SYNDROME 04/03/2010   DEGENERATIVE JOINT DISEASE, CERVICAL SPINE 04/03/2010   FIBROMYALGIA 04/03/2010    PCP: Pizza Montie, MD  REFERRING PROVIDER: Melodi Lerner, MD  REFERRING DIAG: presence of right artificial knee joint, s/p right TKA  THERAPY DIAG:  No diagnosis found.  Rationale for Evaluation and Treatment: Rehabilitation  ONSET DATE: Date of surgery 03/26/2021 after chronic knee pain  SUBJECTIVE:   SUBJECTIVE STATEMENT: Patient states she had a TKA 03/27/2023 and it is still bothering her. She states the front of R knee is bigger, she feels heat over her right knee joint, and it pops sometimes. She states it is swollen and it hurts. She thinks Dr. Melodi gave her an oversized patella, but he says he did not. She states Dr. Melodi said everything is okay with the knee joint. She states she wears shoe inserts all the time.   She thinks her knee is getting worse. She is concerned she has not taken care of it as she should. She can has pain in her low back but she is not aware of it radiating. It stays right in the middle of her back. Her back pain is worse when she gets tired. It can be worse in the morning. She denies known neuropathy  She used to do yoga regularly successfully. She was exercising more after her  knee surgery than she is now. She does not have a current exercise program.    Per Dr. Melodi assessment on 05/15/2023:  ASSESSMENT Status post right total knee arthroplasty. Functionally, the knee is doing well with good range of motion and good stability. The patient experiences fatigue and a weak feeling after being up for a while, which may suggest an element of spinal stenosis. She did very well with physical therapy previously and would  benefit from further therapy for strengthening and improving stamina. There are no mechanical issues with the knee, and there are no signs of soft tissue problems or infection. PLAN: - The patient will be referred to physical therapy at [facility] twice a week for 4 to 6 weeks to improve strength and stamina.  PERTINENT HISTORY: Patient is a 73 y.o. female who presents to outpatient physical therapy with a referral for medical diagnosis presence of right artificial knee joint, s/p right TKA. This patient's chief complaints consist of chronic right knee pain, stiffness, and weakness leading to the following functional deficits: difficulty with shopping, being in cold places (outside, cold section of grocery store), managing group home, bowling, traveling, being with family, activities that require standing, housework, yardwork, stairs, getting up/down from a chair. Relevant past medical history and comorbidities include has HYPERCHOLESTEROLEMIA; ANEMIA, IRON DEFICIENCY; DEPRESSION, MAJOR, MODERATE; Essential hypertension; ALLERGIC RHINITIS; Chronic interstitial cystitis with hematuria; POSTMENOPAUSAL SYNDROME; DEGENERATIVE JOINT DISEASE, CERVICAL SPINE; FIBROMYALGIA; Tinnitus, bilateral; Peripheral neuropathy; Osteoporosis; Hearing loss; Generalized anxiety disorder; Irritable bowel syndrome; Syncope; Urge incontinence; Atrophic vaginitis; Edema; Abdominal bloating; Breast cancer of lower-outer quadrant of right female breast (HCC); Plantar fasciitis; Thyroid  cyst; Leukocytosis; OA (osteoarthritis) of knee; and Primary osteoarthritis of right knee on their problem list.  has a past medical history of Allergy, Anxiety, Breast cancer (HCC) (12/2014), Depression, Diabetes mellitus without complication (pt states in remission), Diverticulosis, Fibromyalgia, GERD (gastroesophageal reflux disease), Heart murmur, High cholesterol, Hypertension, and Joint pain.  has a past surgical history that  includes Tubal ligation; Breast lumpectomy with radioactive seed and sentinel lymph node biopsy (Right, 02/13/2015); Foot surgery (Left); Breast lumpectomy (Right, 2016); and Total knee arthroplasty (Right, 03/26/2021). Patient denies hx of stroke, seizures, lung problems, heart problems, unexplained weight loss, unexplained changes in bowel or bladder problems, unexplained stumbling or dropping things, and spinal surgery.   PAIN: Are you having pain? Yes NPRS: Current: 1/10 (sitting),  Best: 1/10, Worst: 9/10. Pain location: around the whole front of the knee, she can have pain above and below her knee if it is really bad . Pain description: throbbing, popping Aggravating factors: when on it, getting up in the morning, walking without shoes or socks because the cold floor will go up leg to knee, going out in the cold, stairs Relieving factors: Meloxicam  (but she tries not to take because it makes her gains weight), muscle relaxer helped, possibly putting ice on it or keeping it elevated, sitting.     FUNCTIONAL LIMITATIONS: difficulty with shopping, being in cold places (outside, cold section of grocery store), managing group home, bowling, traveling, being with family, activities that require standing, housework, yardwork, stairs, getting up/down from a chair.  LEISURE: bowling, traveling, active in church, shopping, going to movies, being with family, spending time with husband  PRECAUTIONS: None  WEIGHT BEARING RESTRICTIONS: No  FALLS:  Has patient fallen in last 6 months? Yes. Number of falls 1 Went to wash room and did know there was mineral oil that was spilled, so she slipped.  She is not worried about falling.  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  PLOF: she was a lot more active before her knee started bothering her. She had no limitations from her knee.   PATIENT GOALS: get back to an exercise routine, for you  to help me with exercises things that I need to do to be able to stretch this leg out again, and to be able to walk for significant period of time without it hurting   NEXT MD VISIT: 07/18/2023   OBJECTIVE  DIAGNOSTIC FINDINGS:  No recent  SELF- REPORTED FUNCTION FOTO score: 47/100 (knee questionnaire)  OBSERVATION/INSPECTION Posture Posture (standing): weight shifted to left LE, R knee flexed.  Anthropometrics Tremor: none Body composition: BMI: 28.0  Skin: The incision sites appear to be well healed Functional Mobility Bed mobility: supine <> sit and rolling I Transfers: sit <> stand mod I with use of B UE when abile Gait: ambulates approx 100 feet with no AD and uneven gait with right knee flexed.   PERIPHERAL JOINT MOTION (in degrees) PASSIVE RANGE OF MOTION (PROM) *Indicates pain 07/01/23 Date Date  Joint/Motion R/L R/L R/L  Knee     Extension -12/11 / /  Flexion 110*/130 / /  Comments:  07/01/2023: B hips and ankles grossly WFL except mild to moderately limited in B hip IR.  MUSCLE PERFORMANCE (MMT):  *Indicates pain 07/01/23 Date Date  Joint/Motion R/L R/L R/L  Hip     Flexion (L1, L2) 4+/5 / /  Abduction 3+/4 / /  Knee     Extension (L3) 5*/5 / /  Flexion (S2) 4+/5 / /  Ankle/Foot     Dorsiflexion (L4) 5/5 / /  Great toe extension (L5) 5/5 / /  Eversion (S1) 5/5 / /  Plantarflexion (S1) 4/4 / /   ACCESSORY MOTION: Left patella hypomobile to medial, lateral, and caudal glide Pain with anterior, posterior, IR, and ER mobilization at Tibiofemormal joint. Hypomobile except with posterior mob it comes against hard tap.  PALPATION: TTP with CC over R patellar tendon and anterior, medial, and lateral joint line; pain over anterior medial proximal tibia and over pes anserine.  Increased wamth over right anterior knee compared to left.   1RM TESTING: Single leg quad extension machine                                                                                                                                 TODAY'S TREATMENT education  PATIENT EDUCATION:  Education details: Education on diagnosis, prognosis, POC, anatomy and physiology of current condition. Person educated: Patient Education method: Explanation Education comprehension: verbalized understanding and needs further education  HOME EXERCISE PROGRAM: TBD  ASSESSMENT:  CLINICAL IMPRESSION: Patient is a 73 y.o. female referred to outpatient physical therapy with a medical diagnosis of presence of right artificial knee joint, s/p right TKA who presents with signs and symptoms  consistent with chronic right knee pain and stiffness and deficiency in B LE strength, power, and endurance to be able to complete usual activities without difficulty. Patient has especially limited R knee PROM, patellar and tibiofemoral joint mobility, and hip abduction strength, which are likely contributing to her ongoing pain and dysfunction. Patient presents with significant pain, joint stiffness, ROM, muscle performance (strength/power/endurance), and activity tolerance impairments that are limiting ability to complete shopping, being in cold places (outside, cold section of grocery store), managing group home, bowling, traveling, being with family, activities that require standing, housework, yardwork, stairs, and getting up/down from a chair without difficulty. Patient will benefit from skilled physical therapy intervention to address current body structure impairments and activity limitations to improve function and work towards goals set in current POC in order to return to prior level of function or maximal functional improvement.     OBJECTIVE IMPAIRMENTS: Abnormal gait, decreased activity tolerance, decreased endurance, decreased knowledge of condition, decreased mobility, difficulty walking, decreased ROM, decreased strength, hypomobility, impaired perceived functional ability, impaired flexibility,  postural dysfunction, and pain.   ACTIVITY LIMITATIONS: carrying, lifting, bending, standing, squatting, sleeping, stairs, transfers, locomotion level, and caring for others  PARTICIPATION LIMITATIONS: cleaning, laundry, interpersonal relationship, shopping, community activity, occupation, yard work, and   difficulty with shopping, being in cold places (outside, cold section of grocery store), managing group home, bowling, traveling, being with family, activities that require standing, housework, yardwork, stairs, getting up/down from a chair  PERSONAL FACTORS: Age, Fitness, Past/current experiences, Time since onset of injury/illness/exacerbation, and 3+ comorbidities:   HYPERCHOLESTEROLEMIA; ANEMIA, IRON DEFICIENCY; DEPRESSION, MAJOR, MODERATE; Essential hypertension; ALLERGIC RHINITIS; Chronic interstitial cystitis with hematuria; POSTMENOPAUSAL SYNDROME; DEGENERATIVE JOINT DISEASE, CERVICAL SPINE; FIBROMYALGIA; Tinnitus, bilateral; Peripheral neuropathy; Osteoporosis; Hearing loss; Generalized anxiety disorder; Irritable bowel syndrome; Syncope; Urge incontinence; Atrophic vaginitis; Edema; Abdominal bloating; Breast cancer of lower-outer quadrant of right female breast (HCC); Plantar fasciitis; Thyroid  cyst; Leukocytosis; OA (osteoarthritis) of knee; and Primary osteoarthritis of right knee on their problem list.  has a past medical history of Allergy, Anxiety, Breast cancer (HCC) (12/2014), Depression, Diabetes mellitus without complication (pt states in remission), Diverticulosis, Fibromyalgia, GERD (gastroesophageal reflux disease), Heart murmur, High cholesterol, Hypertension, and Joint pain.  has a past surgical history that includes Tubal ligation; Breast lumpectomy with radioactive seed and sentinel lymph node biopsy (Right, 02/13/2015); Foot surgery (Left); Breast lumpectomy (Right, 2016); and Total knee arthroplasty (Right, 03/26/2021) are also affecting patient's functional outcome.   REHAB  POTENTIAL: Good  CLINICAL DECISION MAKING: Evolving/moderate complexity  EVALUATION COMPLEXITY: Moderate   GOALS: Goals reviewed with patient? No  SHORT TERM GOALS: Target date: 07/15/2023  Patient will be independent with initial home exercise program for self-management of symptoms. Baseline: Initial HEP to be provided at visit 2 as appropriate (07/01/23); Goal status: INITIAL   LONG TERM GOALS: Target date: 09/23/2023  Patient will be independent with a long-term home exercise program for self-management of symptoms.  Baseline: Initial HEP to be provided at visit 2 as appropriate (07/01/23); Goal status: INITIAL  2.  Patient will demonstrate improved FOTO to equal or greater than 57 by visit #14 to demonstrate improvement in overall condition and self-reported functional ability.  Baseline: 47 (07/01/23); Goal status: INITIAL  3.  Patient will improve R knee PROM to equal or greater than 0-120 degrees to improve her ability to complete stairs and step over objects with less difficulty.  Baseline: lacking 12 degrees ext and has 110 degrees flexion (07/01/23); Goal status: INITIAL  4.  Demonstrate 10% improvement in calculated 1RM for right knee extension on knee extension machine to demonstrate improved quad strength for functional activities such as getting in and out of a car, completing stairs, and performing heavy activities around the home.  Baseline: to be tested at visit 2 as appropriate (07/01/23); Goal status: INITIAL  5.  Patient will demonstrate improvement in Patient Specific Functional Scale (PSFS) of equal or greater than 3 points to reflect clinically significant improvement in patient's most valued functional activities. Baseline: to be tested visit 2 as appropriate (07/01/23); Goal status: INITIAL   PLAN:  PT FREQUENCY: 1-2x/week  PT DURATION: 8-12 weeks  PLANNED INTERVENTIONS: 97164- PT Re-evaluation, 97110-Therapeutic exercises, 97530- Therapeutic  activity, 97112- Neuromuscular re-education, 97535- Self Care, [id number]- Manual therapy, [phone]- Gait training, [phone]- Aquatic Therapy, [phone]- Electrical stimulation (unattended), Patient/Family education, Balance training, Stair training, Dry Needling, Joint mobilization, Spinal mobilization, Cryotherapy, and Moist heat  PLAN FOR NEXT SESSION: Update HEP as appropriate, screen vitals, complete 3 item PSFS, complete 1 RM testing for knee extension on quad machine, then progressive LE/functional strengthening/endurance/ROM exercises as tolerated, manual therapy as needed. Education.    [name]. Juli, PT, DPT 07/10/23, 6:00 PM  [facility] [address] [place], [place] [address] P: [phone] I F: [phone]

## 2023-07-10 NOTE — Telephone Encounter (Signed)
 Attempted to call patient after she did not show up for her PT appointment scheduled at 6:15pm today. No answer and unable to leave message.  [name]. Juli, PT, DPT 07/10/23, 6:48 PM  [facility] [address] [place], [place] [address] P: [phone] I F: [phone]

## 2023-07-15 ENCOUNTER — Telehealth: Payer: Self-pay | Admitting: Physical Therapy

## 2023-07-15 ENCOUNTER — Ambulatory Visit: Payer: Medicare PPO | Admitting: Physical Therapy

## 2023-07-15 NOTE — Therapy (Deleted)
 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%   Depression    Diabetes mellitus without complication (HCC)    Diverticulosis    Fibromyalgia    GERD (gastroesophageal reflux disease)    Heart murmur    no murmur documented in PCP note by Montie Pizza, MD 02/09/21   High cholesterol    Hypertension    Joint pain    Past Surgical History:  Procedure Laterality Date   BREAST LUMPECTOMY Right 2016   BREAST LUMPECTOMY WITH RADIOACTIVE SEED AND SENTINEL LYMPH NODE BIOPSY Right 02/13/2015   Procedure: RIGHT BREAST LUMPECTOMY WITH RADIOACTIVE SEED AND SENTINEL LYMPH NODE MAPPING;  Surgeon: Deward Null III, MD;  Location: [facility];  Service: General;  Laterality: Right;   FOOT SURGERY Left    TOTAL KNEE ARTHROPLASTY Right 03/26/2021   Procedure: TOTAL KNEE ARTHROPLASTY;  Surgeon: Melodi Lerner, MD;  Location: [facility];  Service: Orthopedics;  Laterality: Right;   TUBAL LIGATION     Patient Active Problem List   Diagnosis Date Noted   OA (osteoarthritis) of knee 03/26/2021   Primary osteoarthritis of right knee 03/26/2021   Leukocytosis 04/19/2020   Thyroid  cyst 02/26/2018   Plantar fasciitis 02/19/2016   Family history of breast cancer in sister 03/10/2015   Breast cancer of lower-outer quadrant of right female breast (HCC) 02/10/2015   Abdominal bloating 12/16/2013   Edema 11/09/2013   Urge incontinence 05/05/2013   Atrophic vaginitis 05/05/2013   Syncope 02/15/2013   Irritable bowel syndrome 08/20/2012   Hearing loss 11/07/2011   Generalized anxiety disorder 11/07/2011   Peripheral neuropathy 07/05/2011   Osteoporosis 07/05/2011   Tinnitus, bilateral 04/05/2011   HYPERCHOLESTEROLEMIA 04/05/2010    ANEMIA, IRON DEFICIENCY 04/03/2010   DEPRESSION, MAJOR, MODERATE 04/03/2010   Essential hypertension 04/03/2010   ALLERGIC RHINITIS 04/03/2010   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/03/2010   POSTMENOPAUSAL SYNDROME 04/03/2010   DEGENERATIVE JOINT DISEASE, CERVICAL SPINE 04/03/2010   FIBROMYALGIA 04/03/2010    PCP: Pizza Montie, MD  REFERRING PROVIDER: Melodi Lerner, MD  REFERRING DIAG: presence of right artificial knee joint, s/p right TKA  THERAPY DIAG:  No diagnosis found.  Rationale for Evaluation and Treatment: Rehabilitation  ONSET DATE: Date of surgery 03/26/2021 after chronic knee pain  SUBJECTIVE:   PERTINENT HISTORY: Patient is a 73 y.o. female who presents to outpatient physical therapy with a referral for medical diagnosis presence of right artificial knee joint, s/p right TKA. This patient's chief complaints consist of chronic right knee pain, stiffness, and weakness leading to the following functional deficits: difficulty with shopping, being in cold places (outside, cold section of grocery store), managing group home, bowling, traveling, being with family, activities that require standing, housework, yardwork, stairs, getting up/down from a chair. Relevant past medical history and comorbidities include has HYPERCHOLESTEROLEMIA; ANEMIA, IRON DEFICIENCY; DEPRESSION, MAJOR, MODERATE; Essential hypertension; ALLERGIC RHINITIS; Chronic interstitial cystitis with hematuria; POSTMENOPAUSAL SYNDROME; DEGENERATIVE JOINT DISEASE, CERVICAL SPINE; FIBROMYALGIA; Tinnitus, bilateral; Peripheral neuropathy; Osteoporosis; Hearing loss; Generalized anxiety disorder; Irritable bowel syndrome; Syncope; Urge incontinence; Atrophic vaginitis; Edema; Abdominal bloating; Breast cancer of lower-outer quadrant of right female breast (HCC); Plantar fasciitis; Thyroid  cyst; Leukocytosis; OA (osteoarthritis) of knee; and Primary osteoarthritis of right knee on their  problem list.  has a  past medical history of Allergy, Anxiety, Breast cancer (HCC) (12/2014), Depression, Diabetes mellitus without complication (pt states in remission), Diverticulosis, Fibromyalgia, GERD (gastroesophageal reflux disease), Heart murmur, High cholesterol, Hypertension, and Joint pain.  has a past surgical history that includes Tubal ligation; Breast lumpectomy with radioactive seed and sentinel lymph node biopsy (Right, 02/13/2015); Foot surgery (Left); Breast lumpectomy (Right, 2016); and Total knee arthroplasty (Right, 03/26/2021). Patient denies hx of stroke, seizures, lung problems, heart problems, unexplained weight loss, unexplained changes in bowel or bladder problems, unexplained stumbling or dropping things, and spinal surgery.   SUBJECTIVE STATEMENT: ***  PAIN: Are you having pain?  NPRS: ***  PRECAUTIONS: None   PATIENT GOALS: get back to an exercise routine, for you to help me with exercises things that I need to do to be able to stretch this leg out again, and to be able to walk for significant period of time without it hurting   NEXT MD VISIT: 07/18/2023   OBJECTIVE  There were no vitals filed for this visit.  SELF-REPORTED FUNCTION Patient Specific Functional Scale (PSFS)  ***: *** ***: *** ***: *** Average: ***  1 RM TESTING Quad extension machine:  R: L:                                                                                                                              TODAY'S TREATMENT education  PATIENT EDUCATION:  Education details: Education on diagnosis, prognosis, POC, anatomy and physiology of current condition. Person educated: Patient Education method: Explanation Education comprehension: verbalized understanding and needs further education  HOME EXERCISE PROGRAM: TBD  ASSESSMENT:  CLINICAL IMPRESSION: ***  From initial PT evaluation on 07/01/2023:  Patient is a 73 y.o. female referred to outpatient physical therapy with a medical  diagnosis of presence of right artificial knee joint, s/p right TKA who presents with signs and symptoms consistent with chronic right knee pain and stiffness and deficiency in B LE strength, power, and endurance to be able to complete usual activities without difficulty. Patient has especially limited R knee PROM, patellar and tibiofemoral joint mobility, and hip abduction strength, which are likely contributing to her ongoing pain and dysfunction. Patient presents with significant pain, joint stiffness, ROM, muscle performance (strength/power/endurance), and activity tolerance impairments that are limiting ability to complete shopping, being in cold places (outside, cold section of grocery store), managing group home, bowling, traveling, being with family, activities that require standing, housework, yardwork, stairs, and getting up/down from a chair without difficulty. Patient will benefit from skilled physical therapy intervention to address current body structure impairments and activity limitations to improve function and work towards goals set in current POC in order to return to prior level of function or maximal functional improvement.     OBJECTIVE IMPAIRMENTS: Abnormal gait, decreased activity tolerance, decreased endurance, decreased knowledge of condition, decreased mobility, difficulty walking, decreased ROM, decreased strength, hypomobility, impaired perceived functional ability, impaired flexibility,  postural dysfunction, and pain.   ACTIVITY LIMITATIONS: carrying, lifting, bending, standing, squatting, sleeping, stairs, transfers, locomotion level, and caring for others  PARTICIPATION LIMITATIONS: cleaning, laundry, interpersonal relationship, shopping, community activity, occupation, yard work, and   difficulty with shopping, being in cold places (outside, cold section of grocery store), managing group home, bowling, traveling, being with family, activities that require standing, housework,  yardwork, stairs, getting up/down from a chair  PERSONAL FACTORS: Age, Fitness, Past/current experiences, Time since onset of injury/illness/exacerbation, and 3+ comorbidities:   HYPERCHOLESTEROLEMIA; ANEMIA, IRON DEFICIENCY; DEPRESSION, MAJOR, MODERATE; Essential hypertension; ALLERGIC RHINITIS; Chronic interstitial cystitis with hematuria; POSTMENOPAUSAL SYNDROME; DEGENERATIVE JOINT DISEASE, CERVICAL SPINE; FIBROMYALGIA; Tinnitus, bilateral; Peripheral neuropathy; Osteoporosis; Hearing loss; Generalized anxiety disorder; Irritable bowel syndrome; Syncope; Urge incontinence; Atrophic vaginitis; Edema; Abdominal bloating; Breast cancer of lower-outer quadrant of right female breast (HCC); Plantar fasciitis; Thyroid  cyst; Leukocytosis; OA (osteoarthritis) of knee; and Primary osteoarthritis of right knee on their problem list.  has a past medical history of Allergy, Anxiety, Breast cancer (HCC) (12/2014), Depression, Diabetes mellitus without complication (pt states in remission), Diverticulosis, Fibromyalgia, GERD (gastroesophageal reflux disease), Heart murmur, High cholesterol, Hypertension, and Joint pain.  has a past surgical history that includes Tubal ligation; Breast lumpectomy with radioactive seed and sentinel lymph node biopsy (Right, 02/13/2015); Foot surgery (Left); Breast lumpectomy (Right, 2016); and Total knee arthroplasty (Right, 03/26/2021) are also affecting patient's functional outcome.   REHAB POTENTIAL: Good  CLINICAL DECISION MAKING: Evolving/moderate complexity  EVALUATION COMPLEXITY: Moderate   GOALS: Goals reviewed with patient? No  SHORT TERM GOALS: Target date: 07/15/2023  Patient will be independent with initial home exercise program for self-management of symptoms. Baseline: Initial HEP to be provided at visit 2 as appropriate (07/01/23); Goal status: In progress   LONG TERM GOALS: Target date: 09/23/2023  Patient will be independent with a long-term home exercise  program for self-management of symptoms.  Baseline: Initial HEP to be provided at visit 2 as appropriate (07/01/23); Goal status: In progress  2.  Patient will demonstrate improved FOTO to equal or greater than 57 by visit #14 to demonstrate improvement in overall condition and self-reported functional ability.  Baseline: 47 (07/01/23); Goal status: In progress  3.  Patient will improve R knee PROM to equal or greater than 0-120 degrees to improve her ability to complete stairs and step over objects with less difficulty.  Baseline: lacking 12 degrees ext and has 110 degrees flexion (07/01/23); Goal status: In progress  4.  Demonstrate 10% improvement in calculated 1RM for right knee extension on knee extension machine to demonstrate improved quad strength for functional activities such as getting in and out of a car, completing stairs, and performing heavy activities around the home.  Baseline: to be tested at visit 2 as appropriate (07/01/23); Goal status: In progress  5.  Patient will demonstrate improvement in Patient Specific Functional Scale (PSFS) of equal or greater than 3 points to reflect clinically significant improvement in patient's most valued functional activities. Baseline: to be tested visit 2 as appropriate (07/01/23); Goal status: In progress   PLAN:  PT FREQUENCY: 1-2x/week  PT DURATION: 8-12 weeks  PLANNED INTERVENTIONS: 97164- PT Re-evaluation, 97110-Therapeutic exercises, 97530- Therapeutic activity, [id number]- Neuromuscular re-education, 97535- Self Care, 02859- Manual therapy, [phone]- Gait training, [phone]- Aquatic Therapy, 97014- Electrical stimulation (unattended), Patient/Family education, Balance training, Stair training, Dry Needling, Joint mobilization, Spinal mobilization, Cryotherapy, and Moist heat  PLAN FOR NEXT SESSION: Update HEP as appropriate, screen vitals, complete 3 item PSFS, complete 1 RM  testing for knee extension on quad machine, then progressive  LE/functional strengthening/endurance/ROM exercises as tolerated, manual therapy as needed. Education.    [name]. Juli, PT, DPT 07/15/23, 9:08 AM  [facility] [address] [place], [place] [address] P: [phone] I F: [phone]

## 2023-07-15 NOTE — Telephone Encounter (Signed)
 Attempted to call patient when she did not show up for her PT appointment scheduled at 6:15pm today. No answer and unable to leave message.   Asked support staff to remove pt from schedule since this is the 2nd consecutive no-show and unable to reach patient either time.   [name]. Juli, PT, DPT 07/15/23, 6:28 PM  [facility] [address] [place], [place] [address] P: [phone] I F: [phone]

## 2023-07-17 ENCOUNTER — Ambulatory Visit: Payer: Medicare PPO | Admitting: Physical Therapy

## 2023-07-22 ENCOUNTER — Ambulatory Visit: Payer: Medicare PPO | Admitting: Physical Therapy

## 2023-07-29 ENCOUNTER — Encounter: Payer: Medicare PPO | Admitting: Physical Therapy

## 2023-07-31 ENCOUNTER — Encounter: Payer: Medicare PPO | Admitting: Physical Therapy

## 2023-08-01 ENCOUNTER — Ambulatory Visit: Payer: Medicare PPO | Admitting: Physician Assistant

## 2023-08-01 DIAGNOSIS — F330 Major depressive disorder, recurrent, mild: Secondary | ICD-10-CM | POA: Diagnosis not present

## 2023-08-04 ENCOUNTER — Encounter: Payer: Medicare PPO | Admitting: Physical Therapy

## 2023-08-06 ENCOUNTER — Encounter: Payer: Medicare PPO | Admitting: Physical Therapy

## 2023-08-11 ENCOUNTER — Encounter: Payer: Medicare PPO | Admitting: Physical Therapy

## 2023-08-13 ENCOUNTER — Encounter: Payer: Medicare PPO | Admitting: Physical Therapy

## 2023-08-18 ENCOUNTER — Encounter: Payer: Medicare PPO | Admitting: Physical Therapy

## 2023-08-28 ENCOUNTER — Encounter: Payer: Medicare PPO | Admitting: Physical Therapy

## 2023-08-29 DIAGNOSIS — F330 Major depressive disorder, recurrent, mild: Secondary | ICD-10-CM | POA: Diagnosis not present

## 2023-09-01 ENCOUNTER — Encounter: Payer: Medicare PPO | Admitting: Physical Therapy

## 2023-09-02 DIAGNOSIS — H04123 Dry eye syndrome of bilateral lacrimal glands: Secondary | ICD-10-CM | POA: Diagnosis not present

## 2023-09-03 ENCOUNTER — Encounter: Payer: Medicare PPO | Admitting: Physical Therapy

## 2023-09-08 DIAGNOSIS — F3341 Major depressive disorder, recurrent, in partial remission: Secondary | ICD-10-CM | POA: Diagnosis not present

## 2023-09-08 DIAGNOSIS — Z79899 Other long term (current) drug therapy: Secondary | ICD-10-CM | POA: Diagnosis not present

## 2023-09-08 DIAGNOSIS — I10 Essential (primary) hypertension: Secondary | ICD-10-CM | POA: Diagnosis not present

## 2023-09-08 DIAGNOSIS — K219 Gastro-esophageal reflux disease without esophagitis: Secondary | ICD-10-CM | POA: Diagnosis not present

## 2023-09-08 DIAGNOSIS — M797 Fibromyalgia: Secondary | ICD-10-CM | POA: Diagnosis not present

## 2023-09-08 DIAGNOSIS — E559 Vitamin D deficiency, unspecified: Secondary | ICD-10-CM | POA: Diagnosis not present

## 2023-09-08 DIAGNOSIS — F419 Anxiety disorder, unspecified: Secondary | ICD-10-CM | POA: Diagnosis not present

## 2023-09-08 DIAGNOSIS — I700 Atherosclerosis of aorta: Secondary | ICD-10-CM | POA: Diagnosis not present

## 2023-09-08 DIAGNOSIS — E785 Hyperlipidemia, unspecified: Secondary | ICD-10-CM | POA: Diagnosis not present

## 2023-09-08 DIAGNOSIS — E1169 Type 2 diabetes mellitus with other specified complication: Secondary | ICD-10-CM | POA: Diagnosis not present

## 2023-09-11 ENCOUNTER — Other Ambulatory Visit: Payer: Self-pay | Admitting: *Deleted

## 2023-09-11 DIAGNOSIS — C50511 Malignant neoplasm of lower-outer quadrant of right female breast: Secondary | ICD-10-CM

## 2023-09-11 NOTE — Progress Notes (Signed)
 Received call from pt stating she missed breast MRI appt for December and requesting new order be placed so she can be scheduled.  RN reviewed with MD and verbal orders received and placed.

## 2023-09-13 NOTE — Progress Notes (Deleted)
   Cardiology Office Note    Date:  09/13/2023  ID:  Shannon Hancock, DOB [birth year], MRN [id number] PCP:  Laurann Montana, MD  Cardiologist:  Dr. Odis Hollingshead Electrophysiologist:  None   Chief Complaint: ***  History of Present Illness: .    Shannon Hancock is a 73 y.o. female with visit-pertinent history of HTN, HLD, DM, MVP, aortic atherosclerosis, GERD, mild TR seen for follow-up. Per Dr. Emelda Brothers notes, she had prior evaluation in [place] with negative troponins and stress test 10/2022 "[employer] in [place]. Normal rest/stress SPECT myocardial perfusion images. There is no evidence of significant infarction or ischemia. Left ventricle function is normal. There are no wall motion abnormalities. The left ventricle is normal in size. Transient ischemic dilation ratio is considered normal. This study suggests a low risk for cardiovascular event and is associated with a cardiac mortality of less than 1% per year. There is no no prior study for comparison." He obtained echo showing EF 60-65%, mild TR, no MVP mentioned.  Calcium score? Last labs 10/2022 Echo 2027  Precordial pain HTN HLD Mild TR  Labwork independently reviewed: 10/2022 trig 115, LDL 63, K 3.5, Cr 0.92, trop neg, d-dimer 0.56, Hgb 12.8, plt 291, AST ALT OK  ROS: .    Please see the history of present illness. Otherwise, review of systems is positive for ***.  All other systems are reviewed and otherwise negative.  Studies Reviewed: Marland Kitchen    EKG:  EKG is ordered today, personally reviewed, demonstrating ***  CV Studies: Cardiac studies reviewed are outlined and summarized above. Otherwise please see EMR for full report.   Current Reported Medications:.    No outpatient medications have been marked as taking for the 09/15/23 encounter (Appointment) with Laurann Montana, PA-C.    Physical Exam:    VS:  There were no vitals taken for this visit.   Wt Readings from Last 3 Encounters:  05/06/23 165 lb 6.4 oz (75  kg)  02/18/23 167 lb 3.2 oz (75.8 kg)  02/28/22 159 lb 9.6 oz (72.4 kg)    GEN: Well nourished, well developed in no acute distress NECK: No JVD; No carotid bruits CARDIAC: ***RRR, no murmurs, rubs, gallops RESPIRATORY:  Clear to auscultation without rales, wheezing or rhonchi  ABDOMEN: Soft, non-tender, non-distended EXTREMITIES:  No edema; No acute deformity   Asessement and Plan:.     ***     Disposition: F/u with ***  Signed, Laurann Montana, PA-C

## 2023-09-15 ENCOUNTER — Ambulatory Visit: Payer: Medicare PPO | Attending: Physician Assistant | Admitting: Physician Assistant

## 2023-09-15 DIAGNOSIS — R072 Precordial pain: Secondary | ICD-10-CM

## 2023-09-15 DIAGNOSIS — I071 Rheumatic tricuspid insufficiency: Secondary | ICD-10-CM

## 2023-09-15 DIAGNOSIS — I10 Essential (primary) hypertension: Secondary | ICD-10-CM

## 2023-09-15 DIAGNOSIS — E785 Hyperlipidemia, unspecified: Secondary | ICD-10-CM

## 2023-10-14 ENCOUNTER — Ambulatory Visit
Admission: RE | Admit: 2023-10-14 | Discharge: 2023-10-14 | Disposition: A | Discharge status: Home / Self Care | Source: Ambulatory Visit | Attending: Hematology and Oncology | Admitting: Hematology and Oncology

## 2023-10-14 DIAGNOSIS — Z853 Personal history of malignant neoplasm of breast: Secondary | ICD-10-CM | POA: Diagnosis not present

## 2023-10-14 DIAGNOSIS — C50511 Malignant neoplasm of lower-outer quadrant of right female breast: Secondary | ICD-10-CM

## 2023-10-14 DIAGNOSIS — Z1239 Encounter for other screening for malignant neoplasm of breast: Secondary | ICD-10-CM | POA: Diagnosis not present

## 2023-10-14 MED ORDER — GADOPICLENOL 0.5 MMOL/ML IV SOLN
7.5000 mL | Freq: Once | INTRAVENOUS | Status: AC | PRN
  Administered 2023-10-14: 7.5 mL via INTRAVENOUS

## 2023-10-17 ENCOUNTER — Emergency Department (HOSPITAL_COMMUNITY)

## 2023-10-17 ENCOUNTER — Other Ambulatory Visit: Payer: Self-pay

## 2023-10-17 ENCOUNTER — Emergency Department (HOSPITAL_COMMUNITY)
Admission: EM | Admit: 2023-10-17 | Discharge: 2023-10-17 | Disposition: A | Discharge status: Home / Self Care | Attending: Emergency Medicine | Admitting: Emergency Medicine

## 2023-10-17 ENCOUNTER — Encounter (HOSPITAL_COMMUNITY): Payer: Self-pay

## 2023-10-17 DIAGNOSIS — Z96651 Presence of right artificial knee joint: Secondary | ICD-10-CM | POA: Diagnosis not present

## 2023-10-17 DIAGNOSIS — I700 Atherosclerosis of aorta: Secondary | ICD-10-CM | POA: Diagnosis not present

## 2023-10-17 DIAGNOSIS — M79602 Pain in left arm: Secondary | ICD-10-CM | POA: Diagnosis not present

## 2023-10-17 DIAGNOSIS — Z041 Encounter for examination and observation following transport accident: Secondary | ICD-10-CM | POA: Diagnosis not present

## 2023-10-17 DIAGNOSIS — Z79899 Other long term (current) drug therapy: Secondary | ICD-10-CM | POA: Diagnosis not present

## 2023-10-17 DIAGNOSIS — S0990XA Unspecified injury of head, initial encounter: Secondary | ICD-10-CM | POA: Diagnosis not present

## 2023-10-17 DIAGNOSIS — S3993XA Unspecified injury of pelvis, initial encounter: Secondary | ICD-10-CM | POA: Diagnosis not present

## 2023-10-17 DIAGNOSIS — Y92410 Unspecified street and highway as the place of occurrence of the external cause: Secondary | ICD-10-CM | POA: Insufficient documentation

## 2023-10-17 DIAGNOSIS — Z471 Aftercare following joint replacement surgery: Secondary | ICD-10-CM | POA: Diagnosis not present

## 2023-10-17 DIAGNOSIS — E119 Type 2 diabetes mellitus without complications: Secondary | ICD-10-CM | POA: Insufficient documentation

## 2023-10-17 DIAGNOSIS — S3991XA Unspecified injury of abdomen, initial encounter: Secondary | ICD-10-CM | POA: Diagnosis not present

## 2023-10-17 DIAGNOSIS — M79661 Pain in right lower leg: Secondary | ICD-10-CM | POA: Insufficient documentation

## 2023-10-17 DIAGNOSIS — I10 Essential (primary) hypertension: Secondary | ICD-10-CM | POA: Diagnosis not present

## 2023-10-17 DIAGNOSIS — S299XXA Unspecified injury of thorax, initial encounter: Secondary | ICD-10-CM | POA: Diagnosis not present

## 2023-10-17 DIAGNOSIS — R1010 Upper abdominal pain, unspecified: Secondary | ICD-10-CM | POA: Diagnosis not present

## 2023-10-17 DIAGNOSIS — S199XXA Unspecified injury of neck, initial encounter: Secondary | ICD-10-CM | POA: Diagnosis not present

## 2023-10-17 DIAGNOSIS — M542 Cervicalgia: Secondary | ICD-10-CM | POA: Insufficient documentation

## 2023-10-17 DIAGNOSIS — M79605 Pain in left leg: Secondary | ICD-10-CM | POA: Diagnosis not present

## 2023-10-17 DIAGNOSIS — M25551 Pain in right hip: Secondary | ICD-10-CM | POA: Insufficient documentation

## 2023-10-17 LAB — COMPREHENSIVE METABOLIC PANEL WITH GFR
ALT: 18 U/L (ref 0–44)
AST: 24 U/L (ref 15–41)
Albumin: 4.4 g/dL (ref 3.5–5.0)
Alkaline Phosphatase: 65 U/L (ref 38–126)
Anion gap: 13 (ref 5–15)
BUN: 9 mg/dL (ref 8–23)
CO2: 26 mmol/L (ref 22–32)
Calcium: 9.9 mg/dL (ref 8.9–10.3)
Chloride: 95 mmol/L — ABNORMAL LOW (ref 98–111)
Creatinine, Ser: 0.87 mg/dL (ref 0.44–1.00)
GFR, Estimated: >60 mL/min (ref >60)
Glucose, Bld: 93 mg/dL (ref 70–99)
Potassium: 3.5 mmol/L (ref 3.5–5.1)
Sodium: 134 mmol/L — ABNORMAL LOW (ref 135–145)
Total Bilirubin: 0.5 mg/dL (ref 0.0–1.2)
Total Protein: 7.6 g/dL (ref 6.5–8.1)

## 2023-10-17 LAB — TYPE AND SCREEN
ABO/RH(D): B POS
Antibody Screen: NEGATIVE

## 2023-10-17 LAB — CBC WITH DIFFERENTIAL/PLATELET
Abs Immature Granulocytes: 0.02 10*3/uL (ref 0.00–0.07)
Basophils Absolute: 0.1 10*3/uL (ref 0.0–0.1)
Basophils Relative: 1 %
Eosinophils Absolute: 0.1 10*3/uL (ref 0.0–0.5)
Eosinophils Relative: 1 %
HCT: 40.2 % (ref 36.0–46.0)
Hemoglobin: 13.2 g/dL (ref 12.0–15.0)
Immature Granulocytes: 0 %
Lymphocytes Relative: 27 %
Lymphs Abs: 2.2 10*3/uL (ref 0.7–4.0)
MCH: 26.8 pg (ref 26.0–34.0)
MCHC: 32.8 g/dL (ref 30.0–36.0)
MCV: 81.7 fL (ref 80.0–100.0)
Monocytes Absolute: 0.6 10*3/uL (ref 0.1–1.0)
Monocytes Relative: 8 %
Neutro Abs: 5.1 10*3/uL (ref 1.7–7.7)
Neutrophils Relative %: 63 %
Platelets: 305 10*3/uL (ref 150–400)
RBC: 4.92 MIL/uL (ref 3.87–5.11)
RDW: 13.7 % (ref 11.5–15.5)
WBC: 8.1 10*3/uL (ref 4.0–10.5)
nRBC: 0 % (ref 0.0–0.2)

## 2023-10-17 LAB — I-STAT CHEM 8, ED
BUN: 8 mg/dL (ref 8–23)
Calcium, Ion: 1.13 mmol/L — ABNORMAL LOW (ref 1.15–1.40)
Chloride: 95 mmol/L — ABNORMAL LOW (ref 98–111)
Creatinine, Ser: 0.9 mg/dL (ref 0.44–1.00)
Glucose, Bld: 93 mg/dL (ref 70–99)
HCT: 42 % (ref 36.0–46.0)
Hemoglobin: 14.3 g/dL (ref 12.0–15.0)
Potassium: 3.4 mmol/L — ABNORMAL LOW (ref 3.5–5.1)
Sodium: 132 mmol/L — ABNORMAL LOW (ref 135–145)
TCO2: 29 mmol/L (ref 22–32)

## 2023-10-17 MED ORDER — FENTANYL CITRATE PF 50 MCG/ML IJ SOSY
50.0000 ug | PREFILLED_SYRINGE | Freq: Once | INTRAMUSCULAR | Status: AC
  Administered 2023-10-17: 50 ug via INTRAVENOUS
  Filled 2023-10-17: qty 1

## 2023-10-17 MED ORDER — IOHEXOL 350 MG/ML SOLN
75.0000 mL | Freq: Once | INTRAVENOUS | Status: DC | PRN
Start: 2023-10-17 — End: 2023-10-18

## 2023-10-17 MED ORDER — METHOCARBAMOL 500 MG PO TABS
500.0000 mg | ORAL_TABLET | Freq: Once | ORAL | Status: AC
  Administered 2023-10-17: 500 mg via ORAL
  Filled 2023-10-17: qty 1

## 2023-10-17 MED ORDER — ACETAMINOPHEN 500 MG PO TABS
1000.0000 mg | ORAL_TABLET | Freq: Once | ORAL | Status: AC
  Administered 2023-10-17: 1000 mg via ORAL
  Filled 2023-10-17: qty 2

## 2023-10-17 MED ORDER — IOHEXOL 350 MG/ML SOLN
60.0000 mL | Freq: Once | INTRAVENOUS | Status: AC | PRN
  Administered 2023-10-17: 60 mL via INTRAVENOUS

## 2023-10-17 NOTE — ED Notes (Signed)
 Patient dc'ed home at this time no acute distress noted vss pt a/o. Reviewed discharge instructions and follow up care answered all questions verbalized understanding patient ambulated steady without assist encouraged to return to er if s/s persist or become bothersome
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
## 2023-10-24 DIAGNOSIS — Z853 Personal history of malignant neoplasm of breast: Secondary | ICD-10-CM | POA: Diagnosis not present

## 2023-10-24 DIAGNOSIS — M62838 Other muscle spasm: Secondary | ICD-10-CM | POA: Diagnosis not present

## 2023-10-24 DIAGNOSIS — S20212A Contusion of left front wall of thorax, initial encounter: Secondary | ICD-10-CM | POA: Diagnosis not present

## 2023-10-24 DIAGNOSIS — E785 Hyperlipidemia, unspecified: Secondary | ICD-10-CM | POA: Diagnosis not present

## 2023-11-25 DIAGNOSIS — K219 Gastro-esophageal reflux disease without esophagitis: Secondary | ICD-10-CM | POA: Diagnosis not present

## 2023-11-25 DIAGNOSIS — R682 Dry mouth, unspecified: Secondary | ICD-10-CM | POA: Diagnosis not present

## 2023-12-13 ENCOUNTER — Encounter: Payer: Self-pay | Admitting: Emergency Medicine

## 2023-12-13 ENCOUNTER — Emergency Department

## 2023-12-13 ENCOUNTER — Emergency Department
Admission: EM | Admit: 2023-12-13 | Discharge: 2023-12-13 | Disposition: A | Discharge status: Home / Self Care | Attending: Emergency Medicine | Admitting: Emergency Medicine

## 2023-12-13 ENCOUNTER — Other Ambulatory Visit: Payer: Self-pay

## 2023-12-13 DIAGNOSIS — R0789 Other chest pain: Secondary | ICD-10-CM | POA: Diagnosis not present

## 2023-12-13 DIAGNOSIS — E119 Type 2 diabetes mellitus without complications: Secondary | ICD-10-CM | POA: Insufficient documentation

## 2023-12-13 DIAGNOSIS — I10 Essential (primary) hypertension: Secondary | ICD-10-CM | POA: Insufficient documentation

## 2023-12-13 DIAGNOSIS — H9203 Otalgia, bilateral: Secondary | ICD-10-CM | POA: Insufficient documentation

## 2023-12-13 DIAGNOSIS — R002 Palpitations: Secondary | ICD-10-CM | POA: Diagnosis not present

## 2023-12-13 DIAGNOSIS — H9209 Otalgia, unspecified ear: Secondary | ICD-10-CM

## 2023-12-13 DIAGNOSIS — R079 Chest pain, unspecified: Secondary | ICD-10-CM | POA: Diagnosis not present

## 2023-12-13 LAB — TROPONIN I (HIGH SENSITIVITY): Troponin I (High Sensitivity): 4 ng/L (ref <18)

## 2023-12-13 LAB — CBC
HCT: 35.2 % — ABNORMAL LOW (ref 36.0–46.0)
Hemoglobin: 11.6 g/dL — ABNORMAL LOW (ref 12.0–15.0)
MCH: 26.9 pg (ref 26.0–34.0)
MCHC: 33 g/dL (ref 30.0–36.0)
MCV: 81.7 fL (ref 80.0–100.0)
Platelets: 301 10*3/uL (ref 150–400)
RBC: 4.31 MIL/uL (ref 3.87–5.11)
RDW: 13.9 % (ref 11.5–15.5)
WBC: 9 10*3/uL (ref 4.0–10.5)
nRBC: 0 % (ref 0.0–0.2)

## 2023-12-13 LAB — BASIC METABOLIC PANEL WITH GFR
Anion gap: 10 (ref 5–15)
BUN: 12 mg/dL (ref 8–23)
CO2: 26 mmol/L (ref 22–32)
Calcium: 9.5 mg/dL (ref 8.9–10.3)
Chloride: 101 mmol/L (ref 98–111)
Creatinine, Ser: 0.94 mg/dL (ref 0.44–1.00)
GFR, Estimated: >60 mL/min (ref >60)
Glucose, Bld: 100 mg/dL — ABNORMAL HIGH (ref 70–99)
Potassium: 3.2 mmol/L — ABNORMAL LOW (ref 3.5–5.1)
Sodium: 137 mmol/L (ref 135–145)

## 2023-12-13 MED ORDER — AMOXICILLIN-POT CLAVULANATE 875-125 MG PO TABS
1.0000 | ORAL_TABLET | Freq: Once | ORAL | Status: AC
  Administered 2023-12-13: 1 via ORAL
  Filled 2023-12-13: qty 1

## 2023-12-13 MED ORDER — AMOXICILLIN-POT CLAVULANATE 875-125 MG PO TABS: 1.0000 | ORAL_TABLET | Freq: Two times a day (BID) | ORAL | 0 refills | Status: DC

## 2023-12-13 NOTE — ED Triage Notes (Signed)
 Pt via POV from home. Pt has multiple complaints. Pt c/o palpations and intermittent chest pain for the past couple of days. Denies cardiac hx. Also reports having bilateral ear fullness and popping sensation. Report also her glands in her neck are swollen. Denies SOB. Denies cough. Reports congestions. Reports that she was seen by ENT 2 weeks ago and states they was suppose to refer her to GI but never did. Pt is A&OX4 and NAD

## 2023-12-13 NOTE — Discharge Instructions (Addendum)
 Please take antibiotic as prescribed.  If you continue to have any tenderness of your neck lymph nodes or enlargement of the neck lymph nodes after completing the antibiotics please follow-up with ENT for further evaluation.  Please call the number provided for GI medicine to arrange a follow-up appointment.  Return to the emergency department for any symptoms concerning to yourself.

## 2023-12-13 NOTE — ED Provider Notes (Signed)
 [facility] Provider Note    Event Date/Time   First MD Initiated Contact with Patient 12/13/23 1940     (approximate)  History   Chief Complaint: Chest Pain and Otalgia  HPI  Shannon Hancock is a 73 y.o. female with a past medical history of anxiety, diabetes, fibromyalgia, gastric reflux, hypertension, hyperlipidemia, presents to the emergency department with complaints of bilateral ear fullness and discomfort.  According to the patient for the past 5 to 6 days now she has had pain in both of her ears which she describes as more of a fullness or pressure sensation.  She states the discomfort goes down into her neck and somewhat into her chest.  Patient states approximately 3 weeks ago she saw ENT but states that was for sore throat and for neck gland enlargement.  Patient states she continues to have some neck gland enlargement.  States she was told to follow-up with GI but she has not heard from them and has not called to make that appointment.  Patient denies any fever.  Denies any shortness of breath or cough.  Physical Exam   Triage Vital Signs: ED Triage Vitals  Encounter Vitals Group     BP 12/13/23 1713 131/63     Girls Systolic BP Percentile --      Girls Diastolic BP Percentile --      Boys Systolic BP Percentile --      Boys Diastolic BP Percentile --      Pulse Rate 12/13/23 1713 82     Resp 12/13/23 1713 18     Temp 12/13/23 1725 98.4 F (36.9 C)     Temp Source 12/13/23 1725 Oral     SpO2 12/13/23 1713 97 %     Weight 12/13/23 1712 152 lb (68.9 kg)     Height 12/13/23 1712 5' 3.5 (1.613 m)     Head Circumference --      Peak Flow --      Pain Score 12/13/23 1710 0     Pain Loc --      Pain Education --      Exclude from Growth Chart --     Most recent vital signs: Vitals:   12/13/23 1725 12/13/23 1936  BP:  (!) 117/57  Pulse:  61  Resp:  13  Temp: 98.4 F (36.9 C)   SpO2:  98%    General: Awake, no distress.   CV:  Good peripheral perfusion.  Regular rate and rhythm  Resp:  Normal effort.  Equal breath sounds bilaterally.  Abd:  No distention.  Soft, nontender.  No rebound or guarding. Other:  Patient has mild amount of wax in bilateral ear canals with some fullness of the eardrum bilaterally but no obvious erythema or signs of significant infection. Patient does have mild bilateral anterior cervical lymphadenopathy.  Normal-appearing oropharynx.  ED Results / Procedures / Treatments   EKG  EKG viewed and interpreted by myself shows a normal sinus rhythm at 75 bpm with a narrow QRS, normal axis, normal intervals, no concerning ST changes.  RADIOLOGY  I have reviewed interpret the chest x-ray images.  No consolidation on my evaluation. Radiology is read the x-ray is negative   MEDICATIONS ORDERED IN ED: Medications  amoxicillin -clavulanate (AUGMENTIN ) 875-125 MG per tablet 1 tablet (has no administration in time range)     IMPRESSION / MDM / ASSESSMENT AND PLAN / ED COURSE  I reviewed the triage vital signs and the nursing  notes.  Patient's presentation is most consistent with acute presentation with potential threat to life or bodily function.  Patient presents the emergency department with multiple complaints including bilateral ear pain and fullness extending down into the neck and chest.  Patient's workup in the emergency department is reassuring with a normal CBC with normal white blood cell count, reassuring chemistry.  Negative troponin.  Chest x-ray is clear and EKG reassuring.  Patient does have some fullness of her tympanic membranes bilaterally but no obvious sign of infection however given the patient's acute onset of pain x 6 days with cervical lymphadenopathy as well I believe it is not unreasonable to cover her for an antibiotic for 1 week.  Discussed with the patient if her lymph nodes in her neck remain tender she needs to follow back up with ENT for evaluation.  Otherwise we  will provide GI medicine's information patient will call on Monday morning to make an appointment.  Patient agreeable to plan of care.  FINAL CLINICAL IMPRESSION(S) / ED DIAGNOSES   Chest pain Otalgia  Note:  This document was prepared using Dragon voice recognition software and may include unintentional dictation errors.   Ruth Cove, MD 12/13/23 1958

## 2023-12-15 DIAGNOSIS — M542 Cervicalgia: Secondary | ICD-10-CM | POA: Diagnosis not present

## 2023-12-17 DIAGNOSIS — K219 Gastro-esophageal reflux disease without esophagitis: Secondary | ICD-10-CM | POA: Diagnosis not present

## 2023-12-17 DIAGNOSIS — R1314 Dysphagia, pharyngoesophageal phase: Secondary | ICD-10-CM | POA: Diagnosis not present

## 2023-12-17 DIAGNOSIS — R1310 Dysphagia, unspecified: Secondary | ICD-10-CM | POA: Diagnosis not present

## 2023-12-17 DIAGNOSIS — K117 Disturbances of salivary secretion: Secondary | ICD-10-CM | POA: Diagnosis not present

## 2023-12-17 DIAGNOSIS — J312 Chronic pharyngitis: Secondary | ICD-10-CM | POA: Diagnosis not present

## 2024-01-05 DIAGNOSIS — M542 Cervicalgia: Secondary | ICD-10-CM | POA: Diagnosis not present

## 2024-01-13 DIAGNOSIS — M5030 Other cervical disc degeneration, unspecified cervical region: Secondary | ICD-10-CM | POA: Diagnosis not present

## 2024-01-13 DIAGNOSIS — M797 Fibromyalgia: Secondary | ICD-10-CM | POA: Diagnosis not present

## 2024-02-03 DIAGNOSIS — D509 Iron deficiency anemia, unspecified: Secondary | ICD-10-CM | POA: Diagnosis not present

## 2024-02-03 DIAGNOSIS — I700 Atherosclerosis of aorta: Secondary | ICD-10-CM | POA: Diagnosis not present

## 2024-02-03 DIAGNOSIS — K219 Gastro-esophageal reflux disease without esophagitis: Secondary | ICD-10-CM | POA: Diagnosis not present

## 2024-02-03 DIAGNOSIS — F419 Anxiety disorder, unspecified: Secondary | ICD-10-CM | POA: Diagnosis not present

## 2024-02-03 DIAGNOSIS — M797 Fibromyalgia: Secondary | ICD-10-CM | POA: Diagnosis not present

## 2024-02-03 DIAGNOSIS — E1169 Type 2 diabetes mellitus with other specified complication: Secondary | ICD-10-CM | POA: Diagnosis not present

## 2024-02-03 DIAGNOSIS — F3341 Major depressive disorder, recurrent, in partial remission: Secondary | ICD-10-CM | POA: Diagnosis not present

## 2024-02-03 DIAGNOSIS — E559 Vitamin D deficiency, unspecified: Secondary | ICD-10-CM | POA: Diagnosis not present

## 2024-02-03 DIAGNOSIS — I10 Essential (primary) hypertension: Secondary | ICD-10-CM | POA: Diagnosis not present

## 2024-02-03 DIAGNOSIS — E785 Hyperlipidemia, unspecified: Secondary | ICD-10-CM | POA: Diagnosis not present

## 2024-02-17 DIAGNOSIS — M5030 Other cervical disc degeneration, unspecified cervical region: Secondary | ICD-10-CM | POA: Diagnosis not present

## 2024-02-25 ENCOUNTER — Telehealth: Payer: Self-pay | Admitting: *Deleted

## 2024-02-25 ENCOUNTER — Ambulatory Visit
Admission: EM | Admit: 2024-02-25 | Discharge: 2024-02-25 | Disposition: A | Discharge status: Home / Self Care | Attending: Emergency Medicine | Admitting: Emergency Medicine

## 2024-02-25 DIAGNOSIS — L299 Pruritus, unspecified: Secondary | ICD-10-CM | POA: Diagnosis not present

## 2024-02-25 DIAGNOSIS — N898 Other specified noninflammatory disorders of vagina: Secondary | ICD-10-CM | POA: Diagnosis not present

## 2024-02-25 DIAGNOSIS — R350 Frequency of micturition: Secondary | ICD-10-CM

## 2024-02-25 LAB — POCT URINE DIPSTICK
Bilirubin, UA: NEGATIVE
Glucose, UA: NEGATIVE mg/dL
Ketones, POC UA: NEGATIVE mg/dL
Leukocytes, UA: NEGATIVE
Nitrite, UA: NEGATIVE
POC PROTEIN,UA: NEGATIVE
Spec Grav, UA: 1.015 (ref 1.010–1.025)
Urobilinogen, UA: 0.2 U/dL
pH, UA: 7 (ref 5.0–8.0)

## 2024-02-25 MED ORDER — NYSTATIN 100000 UNIT/GM EX CREA
TOPICAL_CREAM | CUTANEOUS | 0 refills | Status: AC
Start: 2024-02-25 — End: ?

## 2024-02-25 NOTE — ED Triage Notes (Signed)
 Patient also reports generalized itching since Friday. States she started mobic  for knee pain and concerned it may be an allergic reaction to the mobic .

## 2024-02-25 NOTE — Discharge Instructions (Addendum)
 Your vaginal test for yeast is pending.  Use the nystatin  cream as directed.    Your urine does not show signs of infection at this time.  See the attached handout on urinary frequency.    Take an antihistamine such as Allegra or Zyrtec as discussed for your skin itching.  Keep your skin moisturized with Eucerin.    Follow-up with your primary care provider.

## 2024-02-25 NOTE — ED Triage Notes (Signed)
 Patient presents to UC for urinary leakage, urinary freq x 1 week, vaginal itching x 2 days. Hx of UTIs.

## 2024-02-25 NOTE — ED Provider Notes (Signed)
 Shannon Hancock    CSN: [id number] Arrival date & time: 02/25/24  1505      History   Chief Complaint Chief Complaint  Patient presents with   Urinary Frequency    HPI Shannon Hancock is a 73 y.o. female.   Patient presents with urinary frequency x 1 week.  She is concerned for possible UTI.  She also presents with 2-day history of vaginal itching.  No vaginal discharge, rash, sores, abdominal pain, dysuria, hematuria, flank pain, fever, chills.  Patient also presents with generalized skin itching x 5 to 6 days.  She attributes this to meloxicam .  No rash.  No new products.  She takes Allegra daily for seasonal allergies.  The history is provided by the patient and medical records.    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/26/2018   Plantar fasciitis 02/19/2016   Family history of breast cancer in sister 03/10/2015   Breast cancer of lower-outer quadrant of right female breast (HCC) 02/10/2015   Abdominal bloating 12/16/2013   Edema 11/09/2013   Urge incontinence 05/05/2013   Atrophic vaginitis 05/05/2013   Syncope 02/15/2013   Irritable bowel syndrome 08/20/2012   Hearing loss 11/07/2011   Generalized anxiety disorder 11/07/2011   Peripheral neuropathy 07/05/2011   Osteoporosis 07/05/2011   Tinnitus, bilateral 04/05/2011   HYPERCHOLESTEROLEMIA 04/05/2010   ANEMIA, IRON DEFICIENCY 04/03/2010    DEPRESSION, MAJOR, MODERATE 04/03/2010   Essential hypertension 04/03/2010   ALLERGIC RHINITIS 04/03/2010   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/03/2010   POSTMENOPAUSAL SYNDROME 04/03/2010   DEGENERATIVE JOINT DISEASE, CERVICAL SPINE 04/03/2010   FIBROMYALGIA 04/03/2010    Past Surgical History:  Procedure Laterality Date   BREAST LUMPECTOMY Right 2016   BREAST LUMPECTOMY WITH RADIOACTIVE SEED AND SENTINEL LYMPH NODE BIOPSY Right 02/13/2015   Procedure: RIGHT BREAST LUMPECTOMY WITH RADIOACTIVE SEED AND SENTINEL LYMPH NODE MAPPING;  Surgeon: Deward Null III, MD;  Location: [facility];  Service: General;  Laterality: Right;   FOOT SURGERY Left    TOTAL KNEE ARTHROPLASTY Right 03/26/2021   Procedure: TOTAL KNEE ARTHROPLASTY;  Surgeon: Melodi Lerner, MD;  Location: [facility];  Service: Orthopedics;  Laterality: Right;   TUBAL LIGATION      OB History     Gravida  2   Para      Term      Preterm      AB      Living         SAB      IAB      Ectopic      Multiple      Live Births               Home Medications    Prior to Admission medications   Medication Sig Start Date End Date  Taking? Authorizing Provider  CYMBALTA 20 MG capsule Take 1 capsule every day by oral route. 01/13/24  Yes [provider]  montelukast (SINGULAIR) 10 MG tablet Take 10 mg by mouth daily. 12/28/23  Yes [provider]  nystatin  cream (MYCOSTATIN ) Apply to affected area 2 times daily 02/25/24  Yes Corlis Burnard DEL, NP  sertraline (ZOLOFT) 50 MG tablet Take by mouth. 02/03/24  Yes [provider]  acetaminophen  (TYLENOL ) 650 MG CR tablet 2 tablets as needed    [provider]  alendronate  (FOSAMAX ) 70 MG tablet TAKE 1 TABLET BY MOUTH ONCE A WEEK. TAKE WITH A FULL GLASS OF WATER  ON AN EMPTY STOMACH. 05/26/23   Odean Potts, MD  ALPRAZolam  (XANAX ) 1 MG tablet TAKE 1 TABLET BY MOUTH 4 TIMES A DAY AS NEEDED FOR SLEEP OR ANXIETY Patient  taking differently: Take 0.5-1 mg by mouth 3 (three) times daily as needed for anxiety (Depression).    Copland, Spencer, MD  amLODipine  (NORVASC ) 5 MG tablet Take 5 mg by mouth daily.    [provider]  amoxicillin -clavulanate (AUGMENTIN ) 875-125 MG tablet Take 1 tablet by mouth 2 (two) times daily. 12/13/23   Dorothyann Drivers, MD  Artificial Tear Ointment (DRY EYES OP) Place 1 drop into both eyes daily as needed (Dry eye).    [provider]  budesonide-formoterol  (SYMBICORT) 80-4.5 MCG/ACT inhaler Inhale 2 puffs into the lungs daily as needed (Wheezing and Asthma).    [provider]  escitalopram  (LEXAPRO ) 5 MG tablet Take 1 tablet (5 mg total) by mouth daily. 05/06/23   Gudena, Vinay, MD  fexofenadine  (ALLEGRA) 180 MG tablet Take 180 mg by mouth daily.    [provider]  fluticasone (FLONASE) 50 MCG/ACT nasal spray Place 2 sprays into both nostrils daily as needed for allergies or rhinitis.    [provider]  Lactobacillus (PROBIOTIC ACIDOPHILUS PO) Take 1 capsule by mouth daily.    [provider]  losartan -hydrochlorothiazide  (HYZAAR) 100-12.5 MG per tablet TAKE 1 TABLET BY MOUTH DAILY. 01/10/15   Bedsole, Amy E, MD  LYRICA  100 MG capsule TAKE ONE CAPSULE TWICE A DAY 06/17/13   Bedsole, Amy E, MD  Meloxicam  7.5 MG TBDP Take 1 tablet by mouth as needed. 05/06/23   Gudena, Vinay, MD  omeprazole (PRILOSEC) 40 MG capsule Take 40 mg by mouth daily.    [provider]  rosuvastatin (CRESTOR) 5 MG tablet Take 5 mg by mouth 2 (two) times a week. 02/03/21   [provider]  solifenacin  (VESICARE ) 5 MG tablet Take 5 mg by mouth daily.    [provider]  ZETIA  10 MG tablet TAKE 1 TABLET BY MOUTH DAILY. 06/12/14   Avelina Greig BRAVO, MD    Family History Family History  Problem Relation Age of Onset   Alzheimer's disease Mother    Stroke Mother    Diabetes Mother    Hypertension Mother    Hyperlipidemia Mother     Depression Mother    Anxiety disorder Mother    Coronary artery disease Father    Hypertension Father    Depression Father    Prostate cancer Father 60   Colon polyps Father        unspecified number   Diabetes Father    Hyperlipidemia Father    Heart disease Father    Kidney disease Father    Breast cancer Sister 61   Colon polyps Sister        3-4 total   Urinary  tract infection Sister    Hypertension Brother    Hyperlipidemia Brother    Prostate cancer Brother 78   Breast cancer Maternal Aunt        dx. 60s   Kidney cancer Cousin        dx. 62s; smoker while in college   Lung cancer Maternal Uncle        smoker   Cancer Paternal Aunt        unspecified type   Cirrhosis Paternal Uncle    Colon cancer Maternal Grandmother 83   Cancer Maternal Grandfather        unspecified type   Parkinson's disease Maternal Grandfather    Kidney cancer Paternal Grandfather        dx. 91s   Healthy Daughter    Breast cancer Cousin    Lung cancer Other        MGM's sister   Breast cancer Other    Lung cancer Other    Breast cancer Paternal Aunt 76   Congestive Heart Failure Paternal Uncle    Diabetes Paternal Uncle    Healthy Daughter     Social History Social History   Tobacco Use   Smoking status: Former    Current packs/day: 0.00    Types: Cigarettes    Quit date: 07/01/1968    Years since quitting: 55.6   Smokeless tobacco: Never   Tobacco comments:    only smoked in college -   Vaping Use   Vaping status: Never Used  Substance Use Topics   Alcohol use: No   Drug use: No     Allergies   Olive oil, Fentanyl , and Oxycodone    Review of Systems Review of Systems  Constitutional:  Negative for chills and fever.  Gastrointestinal:  Negative for abdominal pain.  Genitourinary:  Positive for frequency. Negative for dysuria, flank pain, hematuria and vaginal discharge.  Skin:  Negative for color change and rash.     Physical Exam Triage Vital Signs ED Triage  Vitals [02/25/24 1553]  Encounter Vitals Group     BP      Girls Systolic BP Percentile      Girls Diastolic BP Percentile      Boys Systolic BP Percentile      Boys Diastolic BP Percentile      Pulse      Resp      Temp      Temp src      SpO2      Weight      Height      Head Circumference      Peak Flow      Pain Score 0     Pain Loc      Pain Education      Exclude from Growth Chart    No data found.  Updated Vital Signs BP 112/60 (BP Location: Left Arm)   Pulse 79   Temp 97.9 F (36.6 C) (Temporal)   Resp 16   SpO2 100%   Visual Acuity Right Eye Distance:   Left Eye Distance:   Bilateral Distance:    Right Eye Near:   Left Eye Near:    Bilateral Near:     Physical Exam Constitutional:      General: She is not in acute distress. HENT:     Mouth/Throat:     Mouth: Mucous membranes are moist.  Cardiovascular:     Rate and Rhythm: Normal rate and regular rhythm.  Heart sounds: Normal heart sounds.  Pulmonary:     Effort: Pulmonary effort is normal. No respiratory distress.     Breath sounds: Normal breath sounds.  Abdominal:     General: Bowel sounds are normal.     Palpations: Abdomen is soft.     Tenderness: There is no abdominal tenderness. There is no right CVA tenderness, left CVA tenderness, guarding or rebound.  Skin:    General: Skin is warm and dry.     Findings: No rash.  Neurological:     Mental Status: She is alert.      UC Treatments / Results  Labs (all labs ordered are listed, but only abnormal results are displayed) Labs Reviewed  POCT URINE DIPSTICK - Abnormal; Notable for the following components:      Result Value   Blood, UA small (*)    All other components within normal limits  CERVICOVAGINAL ANCILLARY ONLY    EKG   Radiology No results found.  Procedures Procedures (including critical care time)  Medications Ordered in UC Medications - No data to display  Initial Impression / Assessment and Plan / UC  Course  I have reviewed the triage vital signs and the nursing notes.  Pertinent labs & imaging results that were available during my care of the patient were reviewed by me and considered in my medical decision making (see chart for details).    Urinary frequency, vaginal itching, generalized skin pruritus.  Afebrile and vital signs are stable.  No rash noted on exam today.  Urine does not show signs of infection.  Patient obtained vaginal self swab for yeast testing only.  Treating vaginal itching with nystatin  cream.  Instructed patient to use Eucerin lotion on her skin and continue a daily antihistamine such as Allegra or Zyrtec.  Instructed her to follow-up with her PCP.  She agrees to plan of care.  Final Clinical Impressions(s) / UC Diagnoses   Final diagnoses:  Urinary frequency  Vaginal itching  Pruritus     Discharge Instructions      Your vaginal test for yeast is pending.  Use the nystatin  cream as directed.    Your urine does not show signs of infection at this time.  See the attached handout on urinary frequency.    Take an antihistamine such as Allegra or Zyrtec as discussed for your skin itching.  Keep your skin moisturized with Eucerin.    Follow-up with your primary care provider.         ED Prescriptions     Medication Sig Dispense Auth. Provider   nystatin  cream (MYCOSTATIN ) Apply to affected area 2 times daily 30 g Corlis Burnard DEL, NP      PDMP not reviewed this encounter.   Corlis Burnard DEL, NP 02/25/24 [phone]

## 2024-03-15 DIAGNOSIS — M5030 Other cervical disc degeneration, unspecified cervical region: Secondary | ICD-10-CM | POA: Diagnosis not present

## 2024-03-15 DIAGNOSIS — M545 Low back pain, unspecified: Secondary | ICD-10-CM | POA: Diagnosis not present

## 2024-05-13 ENCOUNTER — Other Ambulatory Visit: Payer: Self-pay | Admitting: Hematology and Oncology

## 2024-05-20 ENCOUNTER — Other Ambulatory Visit (HOSPITAL_COMMUNITY): Payer: Self-pay

## 2024-06-09 ENCOUNTER — Inpatient Hospital Stay: Admitting: Hematology and Oncology

## 2024-06-30 ENCOUNTER — Telehealth: Payer: Self-pay

## 2024-06-30 ENCOUNTER — Encounter: Payer: Self-pay | Admitting: Hematology and Oncology

## 2024-06-30 DIAGNOSIS — Z1231 Encounter for screening mammogram for malignant neoplasm of breast: Secondary | ICD-10-CM

## 2024-06-30 DIAGNOSIS — C50511 Malignant neoplasm of lower-outer quadrant of right female breast: Secondary | ICD-10-CM

## 2024-06-30 NOTE — Telephone Encounter (Signed)
 Pt called and wants to know if she had her MM in 2025. Appears pt has only had her MRI, but MM was anticipated for 03/2024. She is scheduled to see MD 1/5 and has not had her MM. Per MD VO order placed for MM and MRI in 6 mos. Pt is aware and she was r/s to see MD 2/5. No other concerns at this time.

## 2024-07-05 ENCOUNTER — Inpatient Hospital Stay: Admitting: Hematology and Oncology

## 2024-07-06 ENCOUNTER — Ambulatory Visit

## 2024-07-21 ENCOUNTER — Ambulatory Visit
Admission: RE | Admit: 2024-07-21 | Discharge: 2024-07-21 | Disposition: A | Source: Ambulatory Visit | Attending: Hematology and Oncology

## 2024-07-21 DIAGNOSIS — C50511 Malignant neoplasm of lower-outer quadrant of right female breast: Secondary | ICD-10-CM

## 2024-07-22 ENCOUNTER — Other Ambulatory Visit: Payer: Self-pay | Admitting: Hematology and Oncology

## 2024-07-22 DIAGNOSIS — N644 Mastodynia: Secondary | ICD-10-CM

## 2024-07-28 ENCOUNTER — Inpatient Hospital Stay: Admission: RE | Admit: 2024-07-28 | Discharge: 2024-07-28 | Attending: Hematology and Oncology

## 2024-07-28 DIAGNOSIS — N644 Mastodynia: Secondary | ICD-10-CM

## 2024-08-03 ENCOUNTER — Other Ambulatory Visit: Payer: Self-pay | Admitting: Hematology and Oncology

## 2024-08-05 ENCOUNTER — Inpatient Hospital Stay: Admitting: Hematology and Oncology

## 2024-08-05 VITALS — BP 114/67 | HR 67 | Temp 97.6°F | Resp 18 | Ht 63.5 in | Wt 159.1 lb

## 2024-08-05 DIAGNOSIS — C50511 Malignant neoplasm of lower-outer quadrant of right female breast: Secondary | ICD-10-CM

## 2024-08-05 DIAGNOSIS — Z170 Estrogen receptor positive status [ER+]: Secondary | ICD-10-CM

## 2024-08-05 NOTE — Assessment & Plan Note (Signed)
 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 1 out of 2 lymph nodes were positive for micrometastasis.Oncotype 12 (8% ROR) T1b N1 mic stage IB XRT from 03/20/15 to 04/14/2015   Current treatment: Anastrozole  1 mg daily started November 2016 switched to letrozole  03/01/2019 Letrozole  toxicities: 1.  Diffuse arthralgias and myalgias: Doing better since Shannon Hancock got injections in her knees 2.  Osteopenia: Bone density 04/23/2018: T score -1.5: Continue with calcium and vitamin D  and Fosamax  and weightbearing exercises.  We will obtain a new bone density test in October of this year 3.  Weight gain: Monitoring   Breast cancer index 03/20/2020: No benefit from extended adjuvant therapy, risk of distant recurrence 1.2% Discontinued in 2021   Breast cancer surveillance: 1.  Breast exam 08/05/2024 benign 2.  Mammogram and ultrasound 07/29/2023: Benign breast density category C 3.  Breast MRI 11/20/2021: No evidence of malignancy, breast density category C   We will obtain a breast MRI in July 2026.  Shannon Hancock will continue to alternate mammograms and MRIs.   Shannon Hancock underwent knee replacement surgery.  Shannon Hancock is still healing from that from last year.     Return to clinic in 1 year for follow-up

## 2024-08-05 NOTE — Progress Notes (Signed)
 "  Patient Care Team: Teresa Channel, MD as PCP - General (Family Medicine) Curvin Deward MOULD, MD as Consulting Physician (General Surgery) Dewey Rush, MD as Consulting Physician (Radiation Oncology) Moses Powell Hummer, NP as Nurse Practitioner (Hematology and Oncology) Odean Potts, MD as Consulting Physician (Hematology and Oncology)  DIAGNOSIS:  Encounter Diagnosis  Name Primary?   Malignant neoplasm of lower-outer quadrant of right breast of female, estrogen receptor positive (HCC) Yes    SUMMARY OF ONCOLOGIC HISTORY: Oncology History Overview Note  Breast cancer of lower-outer quadrant of right female breast   Staging form: Breast, AJCC 7th Edition     Clinical: Stage IA (T1b, N0, M0) - Unsigned     Pathologic stage from 02/13/2015: Stage IB (T1b, N44mi, cM0) - Unsigned      Breast cancer of lower-outer quadrant of right female breast (HCC)  01/05/2015 Mammogram   Right breast: possible mass warranting further evaluation   01/05/2015 Breast MRI   10 mm diameter enhancing mass in the lower outer right breast suspicious for malignancy   01/18/2015 Mammogram   Diagnostic mammo and ultrasound showed a 0.6 cm mass in the right breast 8:00 location 6 cm from the nipple, and a 0.5 cm lesion in the 10:00 location of the right breast 6 cm from the nipple. Right axilla was negative for adenopathy.   01/19/2015 Initial Biopsy   Right breast 8:00 mass biopsy showed invasive ductal carcinoma and DCIS, grade 1-2, the 10:00 mass biopsy showed fibrocystic change.ER 100% positive, PR 90% positive, HER-2 negative, Ki-67 10%   01/19/2015 Clinical Stage   Stage IA: T1b N0   02/13/2015 Definitive Surgery   Right breast lumpectomy: margins negative; Invasive ductal carcinoma, G1, 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 1 out of 2 lymph nodes were positive for micrometastasis.   02/13/2015 Oncotype testing   RS 12 (8% ROR)   02/13/2015 Pathologic Stage   Stage IB: T1b N1(mi)   03/10/2015  Procedure   Breast/Ovarian Panel (Gene Dx): no clinically significant variant at ATM, BARD1, BRCA1, BRCA2, BRIP1, CDH1, CHEK2, FANCC, MLH1, MSH2, MSH6, NBN, PALB2, PMS2, PTEN, RAD51C, RAD51D, TP53, and XRCC2.     03/20/2015 - 04/14/2015 Radiation Therapy   Adjuvant XRT [facility]): 42.5 Gy over 17 fractions to the breast using whole-breast tangent fields. Right breast boost 7.5 Gy over 3 fractions. Total dose: 50 Gy   04/19/2015 -  Anti-estrogen oral therapy   Anastrozole  1 mg daily. Planned duration of therapy 5-10 years. Breast cancer index (03/20/20) revealed chance of distant recurrence is 1.2%, no benefit of continued antiestrogen therapy.   07/05/2015 Survivorship   Survivorship care plan completed and mailed to patient in lieu of in person visit.   11/15/2015 Breast MRI   No evidence of breast malignancy. 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  She is about ten years from initial breast cancer diagnosis. She has no breast pain, palpable mass, or nipple discharge. Recent surveillance mammogram was normal.  She had a recent migraine that caused anxiety but has no ongoing symptoms. She uses Tylenol  as needed and rarely uses meloxicam  for intermittent pain.  She takes weekly Fosamax  as prescribed for osteoporosis.  She reports that her sister  has myelofibrosis and asked about local hematology resources.  May 06, 2023: Surveillance visit for history of ER+ right breast cancer; 8 years post-diagnosis, status post lumpectomy, adjuvant radiation, and prior anti-estrogen therapy (letrozole  discontinued in 2021 per breast cancer  index). No new symptoms or exam abnormalities; breast exam benign. Ongoing management of osteopenia and recovery from knee replacement. Plan for breast MRI in December 2024 and annual follow-up.     ALLERGIES:  is allergic to olive oil, fentanyl , and oxycodone .  MEDICATIONS:  Current Outpatient Medications  Medication Sig Dispense Refill   acetaminophen  (TYLENOL ) 650 MG CR tablet 2 tablets as needed     alendronate  (FOSAMAX ) 70 MG tablet TAKE 1 TABLET BY MOUTH ONCE A WEEK. TAKE WITH A FULL GLASS OF WATER  ON AN EMPTY STOMACH. 12 tablet 0   ALPRAZolam  (XANAX ) 1 MG tablet TAKE 1 TABLET BY MOUTH 4 TIMES A DAY AS NEEDED FOR SLEEP OR ANXIETY (Patient taking differently: Take 0.5-1 mg by mouth 3 (three) times daily as needed for anxiety (Depression).) 120 tablet 0   amLODipine  (NORVASC ) 5 MG tablet Take 5 mg by mouth daily.     amoxicillin -clavulanate (AUGMENTIN ) 875-125 MG tablet Take 1 tablet by mouth 2 (two) times daily. 14 tablet 0   Artificial Tear Ointment (DRY EYES OP) Place 1 drop into both eyes daily as needed (Dry eye).     budesonide-formoterol  (SYMBICORT) 80-4.5 MCG/ACT inhaler Inhale 2 puffs into the lungs daily as needed (Wheezing and Asthma).     CYMBALTA 20 MG capsule Take 1 capsule every day by oral route.     escitalopram  (LEXAPRO ) 5 MG tablet Take 1 tablet (5 mg total) by mouth daily.     fexofenadine  (ALLEGRA) 180 MG tablet Take 180 mg by mouth daily.     fluticasone (FLONASE) 50 MCG/ACT nasal spray Place 2 sprays into both nostrils daily as needed for allergies or rhinitis.     Lactobacillus (PROBIOTIC ACIDOPHILUS PO) Take 1 capsule by mouth daily.     losartan -hydrochlorothiazide  (HYZAAR) 100-12.5 MG per tablet TAKE 1 TABLET BY MOUTH DAILY. 30 tablet 0   LYRICA  100 MG capsule TAKE ONE CAPSULE TWICE A DAY 60 capsule 3   Meloxicam  7.5 MG TBDP Take 1 tablet by mouth as needed.     montelukast (SINGULAIR) 10 MG tablet Take 10 mg by mouth daily.     nystatin  cream (MYCOSTATIN )  Apply to affected area 2 times daily 30 g 0   omeprazole (PRILOSEC) 40 MG capsule Take 40 mg by mouth daily.     rosuvastatin (CRESTOR) 5 MG tablet Take 5 mg by mouth 2 (two) times a week.     sertraline (ZOLOFT) 50 MG tablet Take by mouth.     solifenacin  (VESICARE ) 5 MG tablet Take 5 mg by mouth daily.     ZETIA  10 MG tablet TAKE 1 TABLET BY MOUTH DAILY. 90 tablet 1   No current facility-administered medications for this visit.    PHYSICAL EXAMINATION: ECOG PERFORMANCE STATUS: 1 - Symptomatic but completely ambulatory  Vitals:   08/05/24 1044  BP: 114/67  Pulse: 67  Resp: 18  Temp: 97.6 F (36.4 C)  SpO2: 99%   Filed Weights   08/05/24 1044  Weight: 159 lb 1.6 oz (72.2 kg)    Physical Exam BREAST: Breasts normal on palpation. ABDOMEN: Liver normal on palpation.  (exam performed in the presence of a chaperone)  LABORATORY DATA:  I have reviewed the data as listed    Latest Ref Rng &  Units 12/13/2023    5:38 PM 10/17/2023    1:56 PM 10/17/2023    1:29 PM  CMP  Glucose 70 - 99 mg/dL 899  93  93   BUN 8 - 23 mg/dL 12  8  9    Creatinine 0.44 - 1.00 mg/dL 9.05  9.09  9.12   Sodium 135 - 145 mmol/L 137  132  134   Potassium 3.5 - 5.1 mmol/L 3.2  3.4  3.5   Chloride 98 - 111 mmol/L 101  95  95   CO2 22 - 32 mmol/L 26   26   Calcium 8.9 - 10.3 mg/dL 9.5   9.9   Total Protein 6.5 - 8.1 g/dL   7.6   Total Bilirubin 0.0 - 1.2 mg/dL   0.5   Alkaline Phos 38 - 126 U/L   65   AST 15 - 41 U/L   24   ALT 0 - 44 U/L   18     Lab Results  Component Value Date   WBC 9.0 12/13/2023   HGB 11.6 (L) 12/13/2023   HCT 35.2 (L) 12/13/2023   MCV 81.7 12/13/2023   PLT 301 12/13/2023   NEUTROABS 5.1 10/17/2023    ASSESSMENT & PLAN:  Breast cancer of lower-outer quadrant of right female breast (HCC) Right breast lumpectomy 02/13/2015: margins negative; Invasive ductal carcinoma, G1, 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 1 out of 2 lymph nodes were positive for  micrometastasis.Oncotype 12 (8% ROR) T1b N1 mic stage IB XRT from 03/20/15 to 04/14/2015   Current treatment: Anastrozole  1 mg daily started November 2016 switched to letrozole  03/01/2019 Letrozole  toxicities: 1.  Diffuse arthralgias and myalgias: Doing better since she got injections in her knees 2.  Osteopenia: Bone density 04/23/2018: T score -1.5: Continue with calcium and vitamin D  and Fosamax  and weightbearing exercises.  We will obtain a new bone density test in October of this year 3.  Weight gain: Monitoring   Breast cancer index 03/20/2020: No benefit from extended adjuvant therapy, risk of distant recurrence 1.2% Discontinued in 2021   Breast cancer surveillance: 1.  Breast exam 08/05/2024 benign 2.  Mammogram and ultrasound 07/29/2023: Benign breast density category C 3.  Breast MRI 11/20/2021: No evidence of malignancy, breast density category C   We will obtain a breast MRI in July 2026.  She will continue to alternate mammograms and MRIs.    Return to clinic in 1 year for follow-up    No orders of the defined types were placed in this encounter.  The patient has a good understanding of the overall plan. she agrees with it. she will call with any problems that may develop before the next visit here.  I personally spent a total of 30 minutes in the care of the patient today including preparing to see the patient, getting/reviewing separately obtained history, performing a medically appropriate exam/evaluation, counseling and educating, placing orders, referring and communicating with other health care professionals, documenting clinical information in the EHR, independently interpreting results, communicating results, and coordinating care.   Dr.Shakala Marlatt 08/05/24    "

## 2025-08-08 ENCOUNTER — Inpatient Hospital Stay: Admitting: Hematology and Oncology
